# Patient Record
Sex: Female | Born: 1960
Health system: Southern US, Community
[De-identification: ages and names within clinical notes are randomized; demographics above are authoritative.]

## PROBLEM LIST (undated history)

## (undated) ENCOUNTER — Emergency Department (HOSPITAL_COMMUNITY): Admission: EM | Payer: Medicare HMO | Source: Home / Self Care

## (undated) DIAGNOSIS — C719 Malignant neoplasm of brain, unspecified: Secondary | ICD-10-CM

## (undated) DIAGNOSIS — D702 Other drug-induced agranulocytosis: Secondary | ICD-10-CM

## (undated) DIAGNOSIS — J019 Acute sinusitis, unspecified: Secondary | ICD-10-CM

## (undated) DIAGNOSIS — R197 Diarrhea, unspecified: Secondary | ICD-10-CM

## (undated) DIAGNOSIS — G4484 Primary exertional headache: Secondary | ICD-10-CM

## (undated) DIAGNOSIS — C801 Malignant (primary) neoplasm, unspecified: Secondary | ICD-10-CM

## (undated) DIAGNOSIS — N760 Acute vaginitis: Secondary | ICD-10-CM

## (undated) DIAGNOSIS — F339 Major depressive disorder, recurrent, unspecified: Secondary | ICD-10-CM

## (undated) HISTORY — PX: BRAIN SURGERY: SHX531

## (undated) HISTORY — DX: Malignant neoplasm of brain, unspecified: C71.9

## (undated) HISTORY — DX: Diarrhea, unspecified: R19.7

## (undated) HISTORY — DX: Acute sinusitis, unspecified: J01.90

## (undated) HISTORY — DX: Other drug-induced agranulocytosis: D70.2

## (undated) HISTORY — DX: Acute vaginitis: N76.0

## (undated) HISTORY — DX: Primary exertional headache: G44.84

## (undated) HISTORY — DX: Major depressive disorder, recurrent, unspecified: F33.9

## (undated) HISTORY — PX: OTHER SURGICAL HISTORY: SHX169

---

## 1991-04-10 HISTORY — PX: OTHER SURGICAL HISTORY: SHX169

## 1997-07-01 ENCOUNTER — Other Ambulatory Visit: Admission: RE | Admit: 1997-07-01 | Discharge: 1997-07-01 | Payer: Self-pay | Admitting: Obstetrics and Gynecology

## 1998-07-05 ENCOUNTER — Other Ambulatory Visit: Admission: RE | Admit: 1998-07-05 | Discharge: 1998-07-05 | Payer: Self-pay | Admitting: Obstetrics and Gynecology

## 1998-09-12 ENCOUNTER — Other Ambulatory Visit: Admission: RE | Admit: 1998-09-12 | Discharge: 1998-09-12 | Payer: Self-pay | Admitting: Obstetrics and Gynecology

## 1999-01-20 ENCOUNTER — Encounter: Admission: RE | Admit: 1999-01-20 | Discharge: 1999-04-20 | Payer: Self-pay | Admitting: Obstetrics and Gynecology

## 1999-04-11 ENCOUNTER — Inpatient Hospital Stay (HOSPITAL_COMMUNITY): Admission: AD | Admit: 1999-04-11 | Discharge: 1999-04-13 | Payer: Self-pay | Admitting: Obstetrics and Gynecology

## 1999-04-15 ENCOUNTER — Encounter: Admission: RE | Admit: 1999-04-15 | Discharge: 1999-07-14 | Payer: Self-pay | Admitting: Gynecology

## 1999-06-05 ENCOUNTER — Other Ambulatory Visit: Admission: RE | Admit: 1999-06-05 | Discharge: 1999-06-05 | Payer: Self-pay | Admitting: Gynecology

## 2000-06-04 ENCOUNTER — Other Ambulatory Visit: Admission: RE | Admit: 2000-06-04 | Discharge: 2000-06-04 | Payer: Self-pay | Admitting: Gynecology

## 2001-09-04 ENCOUNTER — Other Ambulatory Visit: Admission: RE | Admit: 2001-09-04 | Discharge: 2001-09-04 | Payer: Self-pay | Admitting: Gynecology

## 2002-08-28 ENCOUNTER — Encounter: Payer: Self-pay | Admitting: Gynecology

## 2002-08-28 ENCOUNTER — Encounter: Admission: RE | Admit: 2002-08-28 | Discharge: 2002-08-28 | Payer: Self-pay | Admitting: Gynecology

## 2002-09-08 ENCOUNTER — Encounter: Payer: Self-pay | Admitting: Family Medicine

## 2002-09-08 LAB — CONVERTED CEMR LAB

## 2002-12-04 ENCOUNTER — Other Ambulatory Visit: Admission: RE | Admit: 2002-12-04 | Discharge: 2002-12-04 | Payer: Self-pay | Admitting: Gynecology

## 2003-03-19 ENCOUNTER — Encounter: Payer: Self-pay | Admitting: Family Medicine

## 2003-03-19 LAB — CONVERTED CEMR LAB: TSH: 1.12 microintl units/mL

## 2003-03-29 ENCOUNTER — Encounter: Payer: Self-pay | Admitting: Family Medicine

## 2003-03-29 LAB — CONVERTED CEMR LAB
Blood Glucose, Fasting: 84 mg/dL
RBC count: 5.06 10*6/uL
WBC, blood: 7.5 10*3/uL

## 2003-11-08 ENCOUNTER — Encounter: Payer: Self-pay | Admitting: Family Medicine

## 2003-11-08 LAB — CONVERTED CEMR LAB

## 2004-01-12 ENCOUNTER — Encounter: Admission: RE | Admit: 2004-01-12 | Discharge: 2004-01-12 | Payer: Self-pay | Admitting: Gynecology

## 2004-02-15 ENCOUNTER — Other Ambulatory Visit: Admission: RE | Admit: 2004-02-15 | Discharge: 2004-02-15 | Payer: Self-pay | Admitting: Gynecology

## 2004-03-01 ENCOUNTER — Ambulatory Visit: Payer: Self-pay | Admitting: Family Medicine

## 2004-11-21 ENCOUNTER — Ambulatory Visit: Payer: Self-pay | Admitting: Family Medicine

## 2005-02-08 ENCOUNTER — Encounter: Admission: RE | Admit: 2005-02-08 | Discharge: 2005-02-08 | Payer: Self-pay | Admitting: Gynecology

## 2005-02-21 ENCOUNTER — Other Ambulatory Visit: Admission: RE | Admit: 2005-02-21 | Discharge: 2005-02-21 | Payer: Self-pay | Admitting: Gynecology

## 2005-10-11 ENCOUNTER — Encounter: Payer: Self-pay | Admitting: Family Medicine

## 2005-10-11 ENCOUNTER — Ambulatory Visit: Payer: Self-pay | Admitting: Internal Medicine

## 2005-10-11 LAB — CONVERTED CEMR LAB: Rapid Strep: NEGATIVE

## 2005-12-07 ENCOUNTER — Ambulatory Visit: Payer: Self-pay | Admitting: Family Medicine

## 2006-01-10 ENCOUNTER — Encounter (INDEPENDENT_AMBULATORY_CARE_PROVIDER_SITE_OTHER): Payer: Self-pay | Admitting: *Deleted

## 2006-01-10 ENCOUNTER — Inpatient Hospital Stay (HOSPITAL_COMMUNITY): Admission: RE | Admit: 2006-01-10 | Discharge: 2006-01-13 | Payer: Self-pay | Admitting: Neurological Surgery

## 2006-01-10 DIAGNOSIS — C711 Malignant neoplasm of frontal lobe: Secondary | ICD-10-CM | POA: Insufficient documentation

## 2006-01-10 HISTORY — PX: OTHER SURGICAL HISTORY: SHX169

## 2006-01-13 HISTORY — PX: OTHER SURGICAL HISTORY: SHX169

## 2006-01-22 ENCOUNTER — Ambulatory Visit: Admission: RE | Admit: 2006-01-22 | Discharge: 2006-04-22 | Payer: Self-pay | Admitting: Radiation Oncology

## 2006-02-07 ENCOUNTER — Ambulatory Visit: Payer: Self-pay | Admitting: Internal Medicine

## 2006-02-13 LAB — CBC WITH DIFFERENTIAL/PLATELET
Basophils Absolute: 0 10*3/uL (ref 0.0–0.1)
Eosinophils Absolute: 0.1 10*3/uL (ref 0.0–0.5)
HCT: 34.3 % — ABNORMAL LOW (ref 34.8–46.6)
HGB: 11.3 g/dL — ABNORMAL LOW (ref 11.6–15.9)
LYMPH%: 40.4 % (ref 14.0–48.0)
MCV: 69.2 fL — ABNORMAL LOW (ref 81.0–101.0)
MONO%: 6.3 % (ref 0.0–13.0)
NEUT#: 4.5 10*3/uL (ref 1.5–6.5)
Platelets: 455 10*3/uL — ABNORMAL HIGH (ref 145–400)

## 2006-02-20 LAB — CBC WITH DIFFERENTIAL/PLATELET
BASO%: 1.4 % (ref 0.0–2.0)
Eosinophils Absolute: 0.1 10*3/uL (ref 0.0–0.5)
LYMPH%: 32.1 % (ref 14.0–48.0)
MCHC: 33 g/dL (ref 32.0–36.0)
MCV: 69.8 fL — ABNORMAL LOW (ref 81.0–101.0)
MONO%: 7.8 % (ref 0.0–13.0)
NEUT%: 57.1 % (ref 39.6–76.8)
Platelets: 477 10*3/uL — ABNORMAL HIGH (ref 145–400)
RBC: 4.8 10*6/uL (ref 3.70–5.32)

## 2006-02-25 ENCOUNTER — Encounter: Admission: RE | Admit: 2006-02-25 | Discharge: 2006-02-25 | Payer: Self-pay | Admitting: Gynecology

## 2006-02-26 LAB — CBC WITH DIFFERENTIAL/PLATELET
BASO%: 0.2 % (ref 0.0–2.0)
EOS%: 1.4 % (ref 0.0–7.0)
LYMPH%: 27.7 % (ref 14.0–48.0)
MCH: 23.3 pg — ABNORMAL LOW (ref 26.0–34.0)
MCHC: 33.2 g/dL (ref 32.0–36.0)
MONO#: 0.6 10*3/uL (ref 0.1–0.9)
Platelets: 436 10*3/uL — ABNORMAL HIGH (ref 145–400)
RBC: 4.84 10*6/uL (ref 3.70–5.32)
WBC: 7.3 10*3/uL (ref 3.9–10.0)

## 2006-03-01 LAB — LACTATE DEHYDROGENASE: LDH: 152 U/L (ref 94–250)

## 2006-03-01 LAB — CBC WITH DIFFERENTIAL/PLATELET
BASO%: 0.4 % (ref 0.0–2.0)
EOS%: 1.4 % (ref 0.0–7.0)
HCT: 33.7 % — ABNORMAL LOW (ref 34.8–46.6)
MCH: 23.3 pg — ABNORMAL LOW (ref 26.0–34.0)
MCHC: 32.9 g/dL (ref 32.0–36.0)
MONO#: 0.4 10*3/uL (ref 0.1–0.9)
NEUT%: 58.1 % (ref 39.6–76.8)
RBC: 4.77 10*6/uL (ref 3.70–5.32)
RDW: 20.9 % — ABNORMAL HIGH (ref 11.3–14.5)
WBC: 6.1 10*3/uL (ref 3.9–10.0)
lymph#: 2 10*3/uL (ref 0.9–3.3)

## 2006-03-01 LAB — COMPREHENSIVE METABOLIC PANEL
ALT: 13 U/L (ref 0–35)
AST: 14 U/L (ref 0–37)
Albumin: 4.1 g/dL (ref 3.5–5.2)
Alkaline Phosphatase: 98 U/L (ref 39–117)
BUN: 11 mg/dL (ref 6–23)
CO2: 26 mEq/L (ref 19–32)
Calcium: 9.1 mg/dL (ref 8.4–10.5)
Chloride: 105 mEq/L (ref 96–112)
Creatinine, Ser: 0.72 mg/dL (ref 0.40–1.20)
Glucose, Bld: 85 mg/dL (ref 70–99)
Potassium: 4.4 mEq/L (ref 3.5–5.3)
Sodium: 139 mEq/L (ref 135–145)
Total Bilirubin: 0.4 mg/dL (ref 0.3–1.2)
Total Protein: 6.9 g/dL (ref 6.0–8.3)

## 2006-03-05 LAB — CBC WITH DIFFERENTIAL/PLATELET
BASO%: 1 % (ref 0.0–2.0)
Basophils Absolute: 0.1 10*3/uL (ref 0.0–0.1)
EOS%: 1.1 % (ref 0.0–7.0)
HGB: 10.7 g/dL — ABNORMAL LOW (ref 11.6–15.9)
MCH: 23 pg — ABNORMAL LOW (ref 26.0–34.0)
MCHC: 32.7 g/dL (ref 32.0–36.0)
MCV: 70.2 fL — ABNORMAL LOW (ref 81.0–101.0)
MONO%: 6.7 % (ref 0.0–13.0)
NEUT%: 63.4 % (ref 39.6–76.8)
RDW: 21.5 % — ABNORMAL HIGH (ref 11.3–14.5)
lymph#: 1.9 10*3/uL (ref 0.9–3.3)

## 2006-03-07 ENCOUNTER — Other Ambulatory Visit: Admission: RE | Admit: 2006-03-07 | Discharge: 2006-03-07 | Payer: Self-pay | Admitting: Gynecology

## 2006-03-12 LAB — CBC WITH DIFFERENTIAL/PLATELET
BASO%: 0.4 % (ref 0.0–2.0)
EOS%: 1.6 % (ref 0.0–7.0)
HGB: 11.3 g/dL — ABNORMAL LOW (ref 11.6–15.9)
MCH: 23.2 pg — ABNORMAL LOW (ref 26.0–34.0)
MCHC: 32.8 g/dL (ref 32.0–36.0)
MONO#: 0.6 10*3/uL (ref 0.1–0.9)
RDW: 21.2 % — ABNORMAL HIGH (ref 11.3–14.5)
WBC: 6.4 10*3/uL (ref 3.9–10.0)
lymph#: 1.6 10*3/uL (ref 0.9–3.3)

## 2006-03-19 LAB — CBC WITH DIFFERENTIAL/PLATELET
Basophils Absolute: 0 10*3/uL (ref 0.0–0.1)
Eosinophils Absolute: 0.1 10*3/uL (ref 0.0–0.5)
HGB: 11.3 g/dL — ABNORMAL LOW (ref 11.6–15.9)
MONO#: 0.6 10*3/uL (ref 0.1–0.9)
NEUT#: 3.7 10*3/uL (ref 1.5–6.5)
RDW: 21.3 % — ABNORMAL HIGH (ref 11.3–14.5)
lymph#: 1.5 10*3/uL (ref 0.9–3.3)

## 2006-03-21 LAB — CBC WITH DIFFERENTIAL/PLATELET
Basophils Absolute: 0 10*3/uL (ref 0.0–0.1)
Eosinophils Absolute: 0.1 10*3/uL (ref 0.0–0.5)
HCT: 34.2 % — ABNORMAL LOW (ref 34.8–46.6)
HGB: 11.3 g/dL — ABNORMAL LOW (ref 11.6–15.9)
LYMPH%: 20.8 % (ref 14.0–48.0)
MCV: 70.7 fL — ABNORMAL LOW (ref 81.0–101.0)
MONO%: 6.9 % (ref 0.0–13.0)
NEUT#: 3.9 10*3/uL (ref 1.5–6.5)
Platelets: 479 10*3/uL — ABNORMAL HIGH (ref 145–400)
RDW: 20.9 % — ABNORMAL HIGH (ref 11.3–14.5)

## 2006-03-21 LAB — COMPREHENSIVE METABOLIC PANEL
Albumin: 4 g/dL (ref 3.5–5.2)
Alkaline Phosphatase: 98 U/L (ref 39–117)
BUN: 11 mg/dL (ref 6–23)
Glucose, Bld: 110 mg/dL — ABNORMAL HIGH (ref 70–99)
Potassium: 3.9 mEq/L (ref 3.5–5.3)

## 2006-03-26 ENCOUNTER — Ambulatory Visit: Payer: Self-pay | Admitting: Internal Medicine

## 2006-03-26 LAB — CBC WITH DIFFERENTIAL/PLATELET
Basophils Absolute: 0 10*3/uL (ref 0.0–0.1)
Eosinophils Absolute: 0.2 10*3/uL (ref 0.0–0.5)
HGB: 11.1 g/dL — ABNORMAL LOW (ref 11.6–15.9)
MCV: 71.5 fL — ABNORMAL LOW (ref 81.0–101.0)
NEUT#: 3.5 10*3/uL (ref 1.5–6.5)
RDW: 21.3 % — ABNORMAL HIGH (ref 11.3–14.5)
lymph#: 1.1 10*3/uL (ref 0.9–3.3)

## 2006-05-02 LAB — COMPREHENSIVE METABOLIC PANEL
ALT: 10 U/L (ref 0–35)
AST: 13 U/L (ref 0–37)
Albumin: 4 g/dL (ref 3.5–5.2)
Alkaline Phosphatase: 94 U/L (ref 39–117)
Calcium: 9.4 mg/dL (ref 8.4–10.5)
Chloride: 103 mEq/L (ref 96–112)
Potassium: 4.1 mEq/L (ref 3.5–5.3)
Sodium: 141 mEq/L (ref 135–145)
Total Protein: 7 g/dL (ref 6.0–8.3)

## 2006-05-02 LAB — CBC WITH DIFFERENTIAL/PLATELET
EOS%: 1.4 % (ref 0.0–7.0)
HGB: 12.3 g/dL (ref 11.6–15.9)
MCH: 24.5 pg — ABNORMAL LOW (ref 26.0–34.0)
MCV: 72.2 fL — ABNORMAL LOW (ref 81.0–101.0)
MONO%: 8.6 % (ref 0.0–13.0)
NEUT#: 3.3 10*3/uL (ref 1.5–6.5)
RBC: 5.01 10*6/uL (ref 3.70–5.32)
RDW: 18.2 % — ABNORMAL HIGH (ref 11.3–14.5)
lymph#: 1.8 10*3/uL (ref 0.9–3.3)

## 2006-05-06 ENCOUNTER — Ambulatory Visit (HOSPITAL_COMMUNITY): Admission: RE | Admit: 2006-05-06 | Discharge: 2006-05-06 | Payer: Self-pay | Admitting: Radiation Oncology

## 2006-05-16 ENCOUNTER — Ambulatory Visit: Payer: Self-pay | Admitting: Family Medicine

## 2006-05-27 ENCOUNTER — Ambulatory Visit: Payer: Self-pay | Admitting: Internal Medicine

## 2006-05-30 LAB — COMPREHENSIVE METABOLIC PANEL
ALT: 10 U/L (ref 0–35)
AST: 12 U/L (ref 0–37)
Albumin: 3.9 g/dL (ref 3.5–5.2)
Alkaline Phosphatase: 73 U/L (ref 39–117)
BUN: 10 mg/dL (ref 6–23)
Potassium: 4.1 mEq/L (ref 3.5–5.3)
Sodium: 139 mEq/L (ref 135–145)

## 2006-05-30 LAB — CBC WITH DIFFERENTIAL/PLATELET
BASO%: 0.3 % (ref 0.0–2.0)
Basophils Absolute: 0 10*3/uL (ref 0.0–0.1)
EOS%: 2.1 % (ref 0.0–7.0)
MCH: 24.7 pg — ABNORMAL LOW (ref 26.0–34.0)
MCHC: 33.9 g/dL (ref 32.0–36.0)
MCV: 73 fL — ABNORMAL LOW (ref 81.0–101.0)
MONO%: 6.1 % (ref 0.0–13.0)
RBC: 4.92 10*6/uL (ref 3.70–5.32)
RDW: 17.6 % — ABNORMAL HIGH (ref 11.3–14.5)

## 2006-06-14 ENCOUNTER — Ambulatory Visit: Payer: Self-pay | Admitting: Family Medicine

## 2006-06-21 ENCOUNTER — Ambulatory Visit (HOSPITAL_COMMUNITY): Admission: RE | Admit: 2006-06-21 | Discharge: 2006-06-21 | Payer: Self-pay | Admitting: Neurological Surgery

## 2006-06-27 LAB — CBC WITH DIFFERENTIAL/PLATELET
Basophils Absolute: 0 10*3/uL (ref 0.0–0.1)
EOS%: 1.9 % (ref 0.0–7.0)
HCT: 35.4 % (ref 34.8–46.6)
HGB: 12.3 g/dL (ref 11.6–15.9)
MCH: 25.7 pg — ABNORMAL LOW (ref 26.0–34.0)
MCHC: 34.8 g/dL (ref 32.0–36.0)
MCV: 73.7 fL — ABNORMAL LOW (ref 81.0–101.0)
MONO%: 7.1 % (ref 0.0–13.0)
NEUT%: 65.4 % (ref 39.6–76.8)
RDW: 17.9 % — ABNORMAL HIGH (ref 11.3–14.5)

## 2006-06-27 LAB — COMPREHENSIVE METABOLIC PANEL
AST: 15 U/L (ref 0–37)
Alkaline Phosphatase: 88 U/L (ref 39–117)
BUN: 10 mg/dL (ref 6–23)
Creatinine, Ser: 0.72 mg/dL (ref 0.40–1.20)

## 2006-08-05 ENCOUNTER — Ambulatory Visit: Payer: Self-pay | Admitting: Internal Medicine

## 2006-08-08 LAB — COMPREHENSIVE METABOLIC PANEL
AST: 13 U/L (ref 0–37)
Alkaline Phosphatase: 84 U/L (ref 39–117)
BUN: 10 mg/dL (ref 6–23)
Calcium: 8.9 mg/dL (ref 8.4–10.5)
Chloride: 102 mEq/L (ref 96–112)
Creatinine, Ser: 0.63 mg/dL (ref 0.40–1.20)

## 2006-08-08 LAB — CBC WITH DIFFERENTIAL/PLATELET
Basophils Absolute: 0 10*3/uL (ref 0.0–0.1)
EOS%: 1.9 % (ref 0.0–7.0)
HCT: 36.8 % (ref 34.8–46.6)
HGB: 12.7 g/dL (ref 11.6–15.9)
MCH: 26.3 pg (ref 26.0–34.0)
MCV: 76.1 fL — ABNORMAL LOW (ref 81.0–101.0)
MONO%: 5.9 % (ref 0.0–13.0)
NEUT%: 65.7 % (ref 39.6–76.8)
Platelets: 388 10*3/uL (ref 145–400)

## 2006-09-05 LAB — COMPREHENSIVE METABOLIC PANEL
BUN: 11 mg/dL (ref 6–23)
CO2: 26 mEq/L (ref 19–32)
Calcium: 9.2 mg/dL (ref 8.4–10.5)
Chloride: 102 mEq/L (ref 96–112)
Creatinine, Ser: 0.65 mg/dL (ref 0.40–1.20)
Glucose, Bld: 84 mg/dL (ref 70–99)

## 2006-09-05 LAB — CBC WITH DIFFERENTIAL/PLATELET
Basophils Absolute: 0 10*3/uL (ref 0.0–0.1)
EOS%: 1.9 % (ref 0.0–7.0)
Eosinophils Absolute: 0.1 10*3/uL (ref 0.0–0.5)
HCT: 37 % (ref 34.8–46.6)
HGB: 12.8 g/dL (ref 11.6–15.9)
MCH: 25.9 pg — ABNORMAL LOW (ref 26.0–34.0)
MONO#: 0.4 10*3/uL (ref 0.1–0.9)
NEUT%: 66.4 % (ref 39.6–76.8)
lymph#: 1.6 10*3/uL (ref 0.9–3.3)

## 2006-09-27 ENCOUNTER — Ambulatory Visit: Payer: Self-pay | Admitting: Internal Medicine

## 2006-09-30 ENCOUNTER — Ambulatory Visit (HOSPITAL_COMMUNITY): Admission: RE | Admit: 2006-09-30 | Discharge: 2006-09-30 | Payer: Self-pay | Admitting: Internal Medicine

## 2006-10-02 LAB — CBC WITH DIFFERENTIAL/PLATELET
Basophils Absolute: 0 10*3/uL (ref 0.0–0.1)
HCT: 37.6 % (ref 34.8–46.6)
HGB: 13.1 g/dL (ref 11.6–15.9)
MONO#: 0.6 10*3/uL (ref 0.1–0.9)
NEUT#: 5.9 10*3/uL (ref 1.5–6.5)
NEUT%: 73.3 % (ref 39.6–76.8)
RDW: 17.3 % — ABNORMAL HIGH (ref 11.3–14.5)
WBC: 8.1 10*3/uL (ref 3.9–10.0)
lymph#: 1.4 10*3/uL (ref 0.9–3.3)

## 2006-10-02 LAB — COMPREHENSIVE METABOLIC PANEL
ALT: 12 U/L (ref 0–35)
Albumin: 3.9 g/dL (ref 3.5–5.2)
BUN: 9 mg/dL (ref 6–23)
CO2: 25 mEq/L (ref 19–32)
Calcium: 9.2 mg/dL (ref 8.4–10.5)
Chloride: 102 mEq/L (ref 96–112)
Creatinine, Ser: 0.65 mg/dL (ref 0.40–1.20)
Potassium: 3.9 mEq/L (ref 3.5–5.3)

## 2006-10-08 HISTORY — PX: OTHER SURGICAL HISTORY: SHX169

## 2006-11-07 ENCOUNTER — Encounter: Payer: Self-pay | Admitting: Family Medicine

## 2006-11-08 ENCOUNTER — Ambulatory Visit: Payer: Self-pay | Admitting: Internal Medicine

## 2006-11-21 ENCOUNTER — Ambulatory Visit: Admission: RE | Admit: 2006-11-21 | Discharge: 2006-12-31 | Payer: Self-pay | Admitting: Radiation Oncology

## 2006-11-21 LAB — HCG, SERUM, QUALITATIVE: Preg, Serum: NEGATIVE

## 2006-12-02 LAB — CBC WITH DIFFERENTIAL/PLATELET
Basophils Absolute: 0 10*3/uL (ref 0.0–0.1)
EOS%: 3.1 % (ref 0.0–7.0)
HGB: 12.3 g/dL (ref 11.6–15.9)
MCH: 27.3 pg (ref 26.0–34.0)
MCV: 78.1 fL — ABNORMAL LOW (ref 81.0–101.0)
MONO%: 6 % (ref 0.0–13.0)
NEUT#: 1.3 10*3/uL — ABNORMAL LOW (ref 1.5–6.5)
RBC: 4.5 10*6/uL (ref 3.70–5.32)
RDW: 19.7 % — ABNORMAL HIGH (ref 11.3–14.5)
lymph#: 1.6 10*3/uL (ref 0.9–3.3)

## 2006-12-02 LAB — COMPREHENSIVE METABOLIC PANEL
ALT: 12 U/L (ref 0–35)
AST: 17 U/L (ref 0–37)
Albumin: 3.7 g/dL (ref 3.5–5.2)
Calcium: 9 mg/dL (ref 8.4–10.5)
Chloride: 105 mEq/L (ref 96–112)
Potassium: 4.4 mEq/L (ref 3.5–5.3)

## 2006-12-10 LAB — COMPREHENSIVE METABOLIC PANEL
Alkaline Phosphatase: 90 U/L (ref 39–117)
BUN: 14 mg/dL (ref 6–23)
Glucose, Bld: 90 mg/dL (ref 70–99)
Total Bilirubin: 0.5 mg/dL (ref 0.3–1.2)

## 2006-12-10 LAB — CBC WITH DIFFERENTIAL/PLATELET
Basophils Absolute: 0 10*3/uL (ref 0.0–0.1)
EOS%: 2.3 % (ref 0.0–7.0)
Eosinophils Absolute: 0.1 10*3/uL (ref 0.0–0.5)
HCT: 37.4 % (ref 34.8–46.6)
HGB: 13 g/dL (ref 11.6–15.9)
MCH: 27.4 pg (ref 26.0–34.0)
MCV: 79.1 fL — ABNORMAL LOW (ref 81.0–101.0)
MONO%: 6.1 % (ref 0.0–13.0)
NEUT#: 1.6 10*3/uL (ref 1.5–6.5)
NEUT%: 39.2 % — ABNORMAL LOW (ref 39.6–76.8)
Platelets: 373 10*3/uL (ref 145–400)
RDW: 20.3 % — ABNORMAL HIGH (ref 11.3–14.5)

## 2006-12-16 LAB — CBC WITH DIFFERENTIAL/PLATELET
Basophils Absolute: 0 10*3/uL (ref 0.0–0.1)
EOS%: 3.5 % (ref 0.0–7.0)
Eosinophils Absolute: 0.1 10*3/uL (ref 0.0–0.5)
HGB: 12.8 g/dL (ref 11.6–15.9)
LYMPH%: 54.9 % — ABNORMAL HIGH (ref 14.0–48.0)
MCH: 27.8 pg (ref 26.0–34.0)
MCV: 80.2 fL — ABNORMAL LOW (ref 81.0–101.0)
MONO%: 6.7 % (ref 0.0–13.0)
NEUT#: 1.1 10*3/uL — ABNORMAL LOW (ref 1.5–6.5)
Platelets: 255 10*3/uL (ref 145–400)
RBC: 4.6 10*6/uL (ref 3.70–5.32)
RDW: 21.3 % — ABNORMAL HIGH (ref 11.3–14.5)

## 2006-12-16 LAB — COMPREHENSIVE METABOLIC PANEL
AST: 24 U/L (ref 0–37)
Alkaline Phosphatase: 84 U/L (ref 39–117)
BUN: 11 mg/dL (ref 6–23)
Glucose, Bld: 87 mg/dL (ref 70–99)
Total Bilirubin: 0.5 mg/dL (ref 0.3–1.2)

## 2006-12-16 LAB — PHOSPHORUS: Phosphorus: 3.1 mg/dL (ref 2.3–4.6)

## 2006-12-20 ENCOUNTER — Ambulatory Visit (HOSPITAL_COMMUNITY): Admission: RE | Admit: 2006-12-20 | Discharge: 2006-12-20 | Payer: Self-pay | Admitting: Internal Medicine

## 2006-12-30 ENCOUNTER — Ambulatory Visit: Payer: Self-pay | Admitting: Internal Medicine

## 2006-12-30 LAB — CBC WITH DIFFERENTIAL/PLATELET
BASO%: 1 % (ref 0.0–2.0)
Basophils Absolute: 0 10*3/uL (ref 0.0–0.1)
HCT: 37.1 % (ref 34.8–46.6)
HGB: 12.9 g/dL (ref 11.6–15.9)
MCHC: 34.8 g/dL (ref 32.0–36.0)
MONO#: 0.2 10*3/uL (ref 0.1–0.9)
NEUT%: 29.9 % — ABNORMAL LOW (ref 39.6–76.8)
WBC: 2.8 10*3/uL — ABNORMAL LOW (ref 3.9–10.0)
lymph#: 1.7 10*3/uL (ref 0.9–3.3)

## 2006-12-30 LAB — COMPREHENSIVE METABOLIC PANEL
ALT: 41 U/L — ABNORMAL HIGH (ref 0–35)
Albumin: 4.1 g/dL (ref 3.5–5.2)
CO2: 27 mEq/L (ref 19–32)
Calcium: 8.8 mg/dL (ref 8.4–10.5)
Chloride: 106 mEq/L (ref 96–112)
Creatinine, Ser: 0.8 mg/dL (ref 0.40–1.20)

## 2006-12-30 LAB — MAGNESIUM: Magnesium: 1.9 mg/dL (ref 1.5–2.5)

## 2007-01-03 ENCOUNTER — Ambulatory Visit: Payer: Self-pay | Admitting: Family Medicine

## 2007-01-03 DIAGNOSIS — D702 Other drug-induced agranulocytosis: Secondary | ICD-10-CM | POA: Insufficient documentation

## 2007-01-04 ENCOUNTER — Encounter: Payer: Self-pay | Admitting: Family Medicine

## 2007-01-06 LAB — CBC WITH DIFFERENTIAL/PLATELET
Basophils Absolute: 0 10*3/uL (ref 0.0–0.1)
Eosinophils Absolute: 0.1 10*3/uL (ref 0.0–0.5)
HCT: 34.5 % — ABNORMAL LOW (ref 34.8–46.6)
HGB: 12 g/dL (ref 11.6–15.9)
MONO#: 0.3 10*3/uL (ref 0.1–0.9)
NEUT#: 0.8 10*3/uL — ABNORMAL LOW (ref 1.5–6.5)
NEUT%: 30.9 % — ABNORMAL LOW (ref 39.6–76.8)
WBC: 2.6 10*3/uL — ABNORMAL LOW (ref 3.9–10.0)
lymph#: 1.5 10*3/uL (ref 0.9–3.3)

## 2007-01-06 LAB — COMPREHENSIVE METABOLIC PANEL
AST: 54 U/L — ABNORMAL HIGH (ref 0–37)
Albumin: 3.6 g/dL (ref 3.5–5.2)
Alkaline Phosphatase: 76 U/L (ref 39–117)
BUN: 8 mg/dL (ref 6–23)
Creatinine, Ser: 0.71 mg/dL (ref 0.40–1.20)
Potassium: 3.8 mEq/L (ref 3.5–5.3)
Total Bilirubin: 0.4 mg/dL (ref 0.3–1.2)

## 2007-01-06 LAB — PHOSPHORUS: Phosphorus: 2.8 mg/dL (ref 2.3–4.6)

## 2007-01-07 ENCOUNTER — Encounter: Payer: Self-pay | Admitting: Family Medicine

## 2007-01-13 LAB — CBC WITH DIFFERENTIAL/PLATELET
Basophils Absolute: 0 10*3/uL (ref 0.0–0.1)
EOS%: 2.4 % (ref 0.0–7.0)
Eosinophils Absolute: 0.1 10*3/uL (ref 0.0–0.5)
HCT: 37.1 % (ref 34.8–46.6)
HGB: 12.9 g/dL (ref 11.6–15.9)
MCH: 29.2 pg (ref 26.0–34.0)
MCV: 83.9 fL (ref 81.0–101.0)
MONO%: 8.4 % (ref 0.0–13.0)
NEUT#: 1.4 10*3/uL — ABNORMAL LOW (ref 1.5–6.5)
NEUT%: 37.8 % — ABNORMAL LOW (ref 39.6–76.8)

## 2007-01-13 LAB — MAGNESIUM: Magnesium: 1.9 mg/dL (ref 1.5–2.5)

## 2007-01-13 LAB — COMPREHENSIVE METABOLIC PANEL
ALT: 52 U/L — ABNORMAL HIGH (ref 0–35)
Chloride: 106 mEq/L (ref 96–112)
Total Bilirubin: 0.4 mg/dL (ref 0.3–1.2)
Total Protein: 6.6 g/dL (ref 6.0–8.3)

## 2007-01-13 LAB — PHOSPHORUS: Phosphorus: 3.8 mg/dL (ref 2.3–4.6)

## 2007-01-17 ENCOUNTER — Ambulatory Visit (HOSPITAL_COMMUNITY): Admission: RE | Admit: 2007-01-17 | Discharge: 2007-01-17 | Payer: Self-pay | Admitting: Neurosurgery

## 2007-01-27 LAB — MAGNESIUM: Magnesium: 1.8 mg/dL (ref 1.5–2.5)

## 2007-01-27 LAB — CBC WITH DIFFERENTIAL/PLATELET
BASO%: 0.2 % (ref 0.0–2.0)
MCHC: 34.9 g/dL (ref 32.0–36.0)
MONO#: 0.3 10*3/uL (ref 0.1–0.9)
RBC: 4.16 10*6/uL (ref 3.70–5.32)
WBC: 4.4 10*3/uL (ref 3.9–10.0)
lymph#: 1.6 10*3/uL (ref 0.9–3.3)

## 2007-01-27 LAB — COMPREHENSIVE METABOLIC PANEL
ALT: 49 U/L — ABNORMAL HIGH (ref 0–35)
CO2: 26 mEq/L (ref 19–32)
Calcium: 8.9 mg/dL (ref 8.4–10.5)
Chloride: 104 mEq/L (ref 96–112)
Sodium: 140 mEq/L (ref 135–145)
Total Protein: 6.4 g/dL (ref 6.0–8.3)

## 2007-02-03 LAB — MAGNESIUM: Magnesium: 1.7 mg/dL (ref 1.5–2.5)

## 2007-02-03 LAB — COMPREHENSIVE METABOLIC PANEL
ALT: 33 U/L (ref 0–35)
CO2: 26 mEq/L (ref 19–32)
Calcium: 8.4 mg/dL (ref 8.4–10.5)
Chloride: 105 mEq/L (ref 96–112)
Glucose, Bld: 95 mg/dL (ref 70–99)
Sodium: 140 mEq/L (ref 135–145)
Total Protein: 6.3 g/dL (ref 6.0–8.3)

## 2007-02-03 LAB — CBC WITH DIFFERENTIAL/PLATELET
BASO%: 0.7 % (ref 0.0–2.0)
Eosinophils Absolute: 0.1 10*3/uL (ref 0.0–0.5)
HCT: 37.2 % (ref 34.8–46.6)
MCHC: 34.4 g/dL (ref 32.0–36.0)
MONO#: 0.2 10*3/uL (ref 0.1–0.9)
NEUT#: 1.1 10*3/uL — ABNORMAL LOW (ref 1.5–6.5)
NEUT%: 35.9 % — ABNORMAL LOW (ref 39.6–76.8)
Platelets: 385 10*3/uL (ref 145–400)
RBC: 4.34 10*6/uL (ref 3.70–5.32)
WBC: 3.1 10*3/uL — ABNORMAL LOW (ref 3.9–10.0)
lymph#: 1.6 10*3/uL (ref 0.9–3.3)

## 2007-02-03 LAB — PHOSPHORUS: Phosphorus: 2.9 mg/dL (ref 2.3–4.6)

## 2007-02-10 LAB — CBC WITH DIFFERENTIAL/PLATELET
BASO%: 0.6 % (ref 0.0–2.0)
EOS%: 2.6 % (ref 0.0–7.0)
MCH: 30.1 pg (ref 26.0–34.0)
MCHC: 34.9 g/dL (ref 32.0–36.0)
MCV: 86.4 fL (ref 81.0–101.0)
MONO%: 5.4 % (ref 0.0–13.0)
RBC: 4.1 10*6/uL (ref 3.70–5.32)
RDW: 20.3 % — ABNORMAL HIGH (ref 11.3–14.5)

## 2007-02-10 LAB — COMPREHENSIVE METABOLIC PANEL
ALT: 39 U/L — ABNORMAL HIGH (ref 0–35)
AST: 30 U/L (ref 0–37)
Albumin: 3.2 g/dL — ABNORMAL LOW (ref 3.5–5.2)
Alkaline Phosphatase: 89 U/L (ref 39–117)
BUN: 11 mg/dL (ref 6–23)
Potassium: 4 mEq/L (ref 3.5–5.3)
Sodium: 139 mEq/L (ref 135–145)
Total Protein: 5.6 g/dL — ABNORMAL LOW (ref 6.0–8.3)

## 2007-02-10 LAB — PHOSPHORUS: Phosphorus: 3.5 mg/dL (ref 2.3–4.6)

## 2007-02-13 ENCOUNTER — Ambulatory Visit: Payer: Self-pay | Admitting: Internal Medicine

## 2007-02-17 LAB — CBC WITH DIFFERENTIAL/PLATELET
BASO%: 0.3 % (ref 0.0–2.0)
Basophils Absolute: 0 10*3/uL (ref 0.0–0.1)
HCT: 36.7 % (ref 34.8–46.6)
HGB: 12.7 g/dL (ref 11.6–15.9)
MONO#: 0.2 10*3/uL (ref 0.1–0.9)
NEUT%: 46.6 % (ref 39.6–76.8)
WBC: 3.7 10*3/uL — ABNORMAL LOW (ref 3.9–10.0)
lymph#: 1.7 10*3/uL (ref 0.9–3.3)

## 2007-02-17 LAB — COMPREHENSIVE METABOLIC PANEL
ALT: 33 U/L (ref 0–35)
Albumin: 4 g/dL (ref 3.5–5.2)
BUN: 13 mg/dL (ref 6–23)
CO2: 26 mEq/L (ref 19–32)
Calcium: 8.9 mg/dL (ref 8.4–10.5)
Chloride: 102 mEq/L (ref 96–112)
Creatinine, Ser: 0.79 mg/dL (ref 0.40–1.20)

## 2007-02-17 LAB — PHOSPHORUS: Phosphorus: 3.1 mg/dL (ref 2.3–4.6)

## 2007-02-17 LAB — MAGNESIUM: Magnesium: 2 mg/dL (ref 1.5–2.5)

## 2007-02-21 ENCOUNTER — Ambulatory Visit (HOSPITAL_COMMUNITY): Admission: RE | Admit: 2007-02-21 | Discharge: 2007-02-21 | Payer: Self-pay | Admitting: Internal Medicine

## 2007-03-03 LAB — CBC WITH DIFFERENTIAL/PLATELET
BASO%: 0.5 % (ref 0.0–2.0)
Basophils Absolute: 0 10*3/uL (ref 0.0–0.1)
EOS%: 2.1 % (ref 0.0–7.0)
HGB: 12.5 g/dL (ref 11.6–15.9)
MCH: 31.3 pg (ref 26.0–34.0)
MCHC: 34.4 g/dL (ref 32.0–36.0)
MCV: 90.8 fL (ref 81.0–101.0)
MONO%: 7.6 % (ref 0.0–13.0)
NEUT%: 38.7 % — ABNORMAL LOW (ref 39.6–76.8)
RDW: 18.2 % — ABNORMAL HIGH (ref 11.3–14.5)
lymph#: 1.3 10*3/uL (ref 0.9–3.3)

## 2007-03-03 LAB — COMPREHENSIVE METABOLIC PANEL
ALT: 41 U/L — ABNORMAL HIGH (ref 0–35)
AST: 31 U/L (ref 0–37)
Alkaline Phosphatase: 93 U/L (ref 39–117)
BUN: 10 mg/dL (ref 6–23)
Creatinine, Ser: 0.82 mg/dL (ref 0.40–1.20)
Potassium: 4 mEq/L (ref 3.5–5.3)

## 2007-03-10 LAB — CBC WITH DIFFERENTIAL/PLATELET
Basophils Absolute: 0 10*3/uL (ref 0.0–0.1)
Eosinophils Absolute: 0 10*3/uL (ref 0.0–0.5)
HCT: 36 % (ref 34.8–46.6)
LYMPH%: 54 % — ABNORMAL HIGH (ref 14.0–48.0)
MCV: 91.7 fL (ref 81.0–101.0)
MONO#: 0.3 10*3/uL (ref 0.1–0.9)
NEUT#: 1.1 10*3/uL — ABNORMAL LOW (ref 1.5–6.5)
NEUT%: 36 % — ABNORMAL LOW (ref 39.6–76.8)
Platelets: 283 10*3/uL (ref 145–400)
WBC: 3.2 10*3/uL — ABNORMAL LOW (ref 3.9–10.0)

## 2007-03-10 LAB — MAGNESIUM: Magnesium: 2 mg/dL (ref 1.5–2.5)

## 2007-03-10 LAB — COMPREHENSIVE METABOLIC PANEL
ALT: 54 U/L — ABNORMAL HIGH (ref 0–35)
Alkaline Phosphatase: 86 U/L (ref 39–117)
CO2: 25 mEq/L (ref 19–32)
Creatinine, Ser: 0.73 mg/dL (ref 0.40–1.20)
Total Bilirubin: 0.4 mg/dL (ref 0.3–1.2)

## 2007-03-17 LAB — COMPREHENSIVE METABOLIC PANEL
Albumin: 4 g/dL (ref 3.5–5.2)
BUN: 10 mg/dL (ref 6–23)
Calcium: 8.6 mg/dL (ref 8.4–10.5)
Chloride: 103 mEq/L (ref 96–112)
Glucose, Bld: 113 mg/dL — ABNORMAL HIGH (ref 70–99)
Potassium: 4.2 mEq/L (ref 3.5–5.3)

## 2007-03-17 LAB — CBC WITH DIFFERENTIAL/PLATELET
Basophils Absolute: 0 10*3/uL (ref 0.0–0.1)
Eosinophils Absolute: 0 10*3/uL (ref 0.0–0.5)
HCT: 36.6 % (ref 34.8–46.6)
HGB: 12.8 g/dL (ref 11.6–15.9)
MCV: 92.2 fL (ref 81.0–101.0)
MONO%: 8.6 % (ref 0.0–13.0)
NEUT#: 1.4 10*3/uL — ABNORMAL LOW (ref 1.5–6.5)
RDW: 17 % — ABNORMAL HIGH (ref 11.3–14.5)
lymph#: 1.9 10*3/uL (ref 0.9–3.3)

## 2007-03-17 LAB — PHOSPHORUS: Phosphorus: 3 mg/dL (ref 2.3–4.6)

## 2007-03-21 ENCOUNTER — Encounter: Admission: RE | Admit: 2007-03-21 | Discharge: 2007-03-21 | Payer: Self-pay | Admitting: Gynecology

## 2007-03-21 ENCOUNTER — Ambulatory Visit (HOSPITAL_COMMUNITY): Admission: RE | Admit: 2007-03-21 | Discharge: 2007-03-21 | Payer: Self-pay | Admitting: Internal Medicine

## 2007-03-26 ENCOUNTER — Other Ambulatory Visit: Admission: RE | Admit: 2007-03-26 | Discharge: 2007-03-26 | Payer: Self-pay | Admitting: Gynecology

## 2007-03-31 ENCOUNTER — Ambulatory Visit: Payer: Self-pay | Admitting: Internal Medicine

## 2007-03-31 LAB — CBC WITH DIFFERENTIAL/PLATELET
Eosinophils Absolute: 0 10*3/uL (ref 0.0–0.5)
HCT: 36 % (ref 34.8–46.6)
HGB: 12.6 g/dL (ref 11.6–15.9)
LYMPH%: 50.3 % — ABNORMAL HIGH (ref 14.0–48.0)
MONO#: 0.2 10*3/uL (ref 0.1–0.9)
NEUT#: 1 10*3/uL — ABNORMAL LOW (ref 1.5–6.5)
NEUT%: 38.6 % — ABNORMAL LOW (ref 39.6–76.8)
Platelets: 259 10*3/uL (ref 145–400)
WBC: 2.6 10*3/uL — ABNORMAL LOW (ref 3.9–10.0)
lymph#: 1.3 10*3/uL (ref 0.9–3.3)

## 2007-03-31 LAB — COMPREHENSIVE METABOLIC PANEL
ALT: 46 U/L — ABNORMAL HIGH (ref 0–35)
CO2: 24 mEq/L (ref 19–32)
Calcium: 8.5 mg/dL (ref 8.4–10.5)
Chloride: 104 mEq/L (ref 96–112)
Creatinine, Ser: 0.82 mg/dL (ref 0.40–1.20)
Glucose, Bld: 105 mg/dL — ABNORMAL HIGH (ref 70–99)
Total Bilirubin: 0.5 mg/dL (ref 0.3–1.2)
Total Protein: 6.2 g/dL (ref 6.0–8.3)

## 2007-04-07 LAB — CBC WITH DIFFERENTIAL/PLATELET
BASO%: 0.5 % (ref 0.0–2.0)
EOS%: 0.7 % (ref 0.0–7.0)
LYMPH%: 54.8 % — ABNORMAL HIGH (ref 14.0–48.0)
MCH: 31.9 pg (ref 26.0–34.0)
MCHC: 34 g/dL (ref 32.0–36.0)
MCV: 93.8 fL (ref 81.0–101.0)
MONO%: 6 % (ref 0.0–13.0)
Platelets: 288 10*3/uL (ref 145–400)
RBC: 3.74 10*6/uL (ref 3.70–5.32)
WBC: 3 10*3/uL — ABNORMAL LOW (ref 3.9–10.0)

## 2007-04-07 LAB — COMPREHENSIVE METABOLIC PANEL
ALT: 35 U/L (ref 0–35)
Alkaline Phosphatase: 81 U/L (ref 39–117)
Sodium: 138 mEq/L (ref 135–145)
Total Bilirubin: 0.4 mg/dL (ref 0.3–1.2)
Total Protein: 6.1 g/dL (ref 6.0–8.3)

## 2007-04-14 LAB — MAGNESIUM: Magnesium: 1.8 mg/dL (ref 1.5–2.5)

## 2007-04-14 LAB — CBC WITH DIFFERENTIAL/PLATELET
Basophils Absolute: 0 10*3/uL (ref 0.0–0.1)
EOS%: 0.9 % (ref 0.0–7.0)
Eosinophils Absolute: 0 10*3/uL (ref 0.0–0.5)
HCT: 35.1 % (ref 34.8–46.6)
HGB: 12.1 g/dL (ref 11.6–15.9)
MCH: 32.8 pg (ref 26.0–34.0)
MCV: 94.9 fL (ref 81.0–101.0)
MONO%: 8 % (ref 0.0–13.0)
NEUT#: 1.4 10*3/uL — ABNORMAL LOW (ref 1.5–6.5)
NEUT%: 42 % (ref 39.6–76.8)
lymph#: 1.6 10*3/uL (ref 0.9–3.3)

## 2007-04-14 LAB — COMPREHENSIVE METABOLIC PANEL
ALT: 37 U/L — ABNORMAL HIGH (ref 0–35)
CO2: 27 mEq/L (ref 19–32)
Creatinine, Ser: 0.76 mg/dL (ref 0.40–1.20)
Total Bilirubin: 0.5 mg/dL (ref 0.3–1.2)

## 2007-04-21 LAB — CBC WITH DIFFERENTIAL/PLATELET
Basophils Absolute: 0 10*3/uL (ref 0.0–0.1)
Eosinophils Absolute: 0 10*3/uL (ref 0.0–0.5)
HGB: 12.4 g/dL (ref 11.6–15.9)
MCV: 95.3 fL (ref 81.0–101.0)
MONO#: 0.4 10*3/uL (ref 0.1–0.9)
NEUT#: 1.7 10*3/uL (ref 1.5–6.5)
RDW: 17.1 % — ABNORMAL HIGH (ref 11.3–14.5)
WBC: 4.1 10*3/uL (ref 3.9–10.0)
lymph#: 1.9 10*3/uL (ref 0.9–3.3)

## 2007-04-21 LAB — COMPREHENSIVE METABOLIC PANEL
Albumin: 3.8 g/dL (ref 3.5–5.2)
BUN: 12 mg/dL (ref 6–23)
CO2: 27 mEq/L (ref 19–32)
Calcium: 8.6 mg/dL (ref 8.4–10.5)
Chloride: 105 mEq/L (ref 96–112)
Glucose, Bld: 93 mg/dL (ref 70–99)
Potassium: 4 mEq/L (ref 3.5–5.3)
Total Protein: 6.2 g/dL (ref 6.0–8.3)

## 2007-04-28 LAB — CBC WITH DIFFERENTIAL/PLATELET
Basophils Absolute: 0 10*3/uL (ref 0.0–0.1)
Eosinophils Absolute: 0 10*3/uL (ref 0.0–0.5)
HCT: 36.3 % (ref 34.8–46.6)
HGB: 12.6 g/dL (ref 11.6–15.9)
LYMPH%: 38.8 % (ref 14.0–48.0)
MCV: 94.3 fL (ref 81.0–101.0)
MONO%: 10.1 % (ref 0.0–13.0)
NEUT#: 2.3 10*3/uL (ref 1.5–6.5)
Platelets: 334 10*3/uL (ref 145–400)

## 2007-04-28 LAB — MAGNESIUM: Magnesium: 2 mg/dL (ref 1.5–2.5)

## 2007-04-28 LAB — COMPREHENSIVE METABOLIC PANEL
Albumin: 4.1 g/dL (ref 3.5–5.2)
Alkaline Phosphatase: 97 U/L (ref 39–117)
BUN: 12 mg/dL (ref 6–23)
CO2: 26 mEq/L (ref 19–32)
Glucose, Bld: 86 mg/dL (ref 70–99)
Potassium: 4.2 mEq/L (ref 3.5–5.3)
Total Bilirubin: 0.5 mg/dL (ref 0.3–1.2)

## 2007-04-28 LAB — PHOSPHORUS: Phosphorus: 3.7 mg/dL (ref 2.3–4.6)

## 2007-05-12 ENCOUNTER — Ambulatory Visit: Payer: Self-pay | Admitting: Internal Medicine

## 2007-05-12 LAB — CBC WITH DIFFERENTIAL/PLATELET
Eosinophils Absolute: 0.1 10*3/uL (ref 0.0–0.5)
HCT: 34.7 % — ABNORMAL LOW (ref 34.8–46.6)
LYMPH%: 36.2 % (ref 14.0–48.0)
MONO#: 0.5 10*3/uL (ref 0.1–0.9)
NEUT#: 3.2 10*3/uL (ref 1.5–6.5)
NEUT%: 53.9 % (ref 39.6–76.8)
Platelets: 404 10*3/uL — ABNORMAL HIGH (ref 145–400)
WBC: 6 10*3/uL (ref 3.9–10.0)
lymph#: 2.2 10*3/uL (ref 0.9–3.3)

## 2007-05-12 LAB — COMPREHENSIVE METABOLIC PANEL
ALT: 20 U/L (ref 0–35)
CO2: 28 mEq/L (ref 19–32)
Calcium: 8.8 mg/dL (ref 8.4–10.5)
Chloride: 101 mEq/L (ref 96–112)
Creatinine, Ser: 0.73 mg/dL (ref 0.40–1.20)
Glucose, Bld: 92 mg/dL (ref 70–99)
Sodium: 139 mEq/L (ref 135–145)
Total Bilirubin: 0.4 mg/dL (ref 0.3–1.2)
Total Protein: 6.4 g/dL (ref 6.0–8.3)

## 2007-05-12 LAB — MAGNESIUM: Magnesium: 2 mg/dL (ref 1.5–2.5)

## 2007-05-13 ENCOUNTER — Ambulatory Visit (HOSPITAL_COMMUNITY): Admission: RE | Admit: 2007-05-13 | Discharge: 2007-05-13 | Payer: Self-pay | Admitting: Internal Medicine

## 2007-05-26 ENCOUNTER — Encounter: Payer: Self-pay | Admitting: Family Medicine

## 2007-05-26 LAB — COMPREHENSIVE METABOLIC PANEL
ALT: 18 U/L (ref 0–35)
AST: 19 U/L (ref 0–37)
Albumin: 3.9 g/dL (ref 3.5–5.2)
CO2: 27 mEq/L (ref 19–32)
Calcium: 9.1 mg/dL (ref 8.4–10.5)
Chloride: 103 mEq/L (ref 96–112)
Potassium: 4.3 mEq/L (ref 3.5–5.3)
Sodium: 139 mEq/L (ref 135–145)
Total Protein: 6.9 g/dL (ref 6.0–8.3)

## 2007-05-26 LAB — CBC WITH DIFFERENTIAL/PLATELET
BASO%: 0.5 % (ref 0.0–2.0)
EOS%: 1 % (ref 0.0–7.0)
HCT: 37.3 % (ref 34.8–46.6)
MCH: 30.8 pg (ref 26.0–34.0)
MCHC: 34.5 g/dL (ref 32.0–36.0)
MONO#: 0.4 10*3/uL (ref 0.1–0.9)
NEUT%: 50.1 % (ref 39.6–76.8)
RDW: 14.6 % — ABNORMAL HIGH (ref 11.3–14.5)
WBC: 4.9 10*3/uL (ref 3.9–10.0)
lymph#: 2 10*3/uL (ref 0.9–3.3)

## 2007-05-26 LAB — MAGNESIUM: Magnesium: 2 mg/dL (ref 1.5–2.5)

## 2007-06-02 LAB — COMPREHENSIVE METABOLIC PANEL
Albumin: 3.9 g/dL (ref 3.5–5.2)
Alkaline Phosphatase: 86 U/L (ref 39–117)
BUN: 15 mg/dL (ref 6–23)
CO2: 26 mEq/L (ref 19–32)
Glucose, Bld: 98 mg/dL (ref 70–99)
Potassium: 4.4 mEq/L (ref 3.5–5.3)
Sodium: 143 mEq/L (ref 135–145)
Total Protein: 6.7 g/dL (ref 6.0–8.3)

## 2007-06-02 LAB — CBC WITH DIFFERENTIAL/PLATELET
Basophils Absolute: 0 10*3/uL (ref 0.0–0.1)
Eosinophils Absolute: 0.1 10*3/uL (ref 0.0–0.5)
HGB: 12.1 g/dL (ref 11.6–15.9)
LYMPH%: 37.2 % (ref 14.0–48.0)
MCV: 87.7 fL (ref 81.0–101.0)
MONO#: 0.1 10*3/uL (ref 0.1–0.9)
MONO%: 3.1 % (ref 0.0–13.0)
NEUT#: 2.3 10*3/uL (ref 1.5–6.5)
Platelets: 341 10*3/uL (ref 145–400)
RDW: 14.5 % (ref 11.3–14.5)
WBC: 4 10*3/uL (ref 3.9–10.0)

## 2007-06-02 LAB — PHOSPHORUS: Phosphorus: 3.1 mg/dL (ref 2.3–4.6)

## 2007-06-02 LAB — MAGNESIUM: Magnesium: 2 mg/dL (ref 1.5–2.5)

## 2007-06-10 LAB — COMPREHENSIVE METABOLIC PANEL
ALT: 12 U/L (ref 0–35)
AST: 14 U/L (ref 0–37)
CO2: 28 mEq/L (ref 19–32)
Calcium: 8.9 mg/dL (ref 8.4–10.5)
Chloride: 103 mEq/L (ref 96–112)
Potassium: 4 mEq/L (ref 3.5–5.3)
Sodium: 141 mEq/L (ref 135–145)
Total Protein: 6.5 g/dL (ref 6.0–8.3)

## 2007-06-10 LAB — CBC WITH DIFFERENTIAL/PLATELET
BASO%: 1.1 % (ref 0.0–2.0)
EOS%: 2.2 % (ref 0.0–7.0)
HCT: 33.7 % — ABNORMAL LOW (ref 34.8–46.6)
MCH: 29.9 pg (ref 26.0–34.0)
MCHC: 34.1 g/dL (ref 32.0–36.0)
MONO#: 0 10*3/uL — ABNORMAL LOW (ref 0.1–0.9)
RBC: 3.85 10*6/uL (ref 3.70–5.32)
RDW: 13.6 % (ref 11.3–14.5)
WBC: 2.8 10*3/uL — ABNORMAL LOW (ref 3.9–10.0)
lymph#: 1.4 10*3/uL (ref 0.9–3.3)

## 2007-06-10 LAB — MAGNESIUM: Magnesium: 1.9 mg/dL (ref 1.5–2.5)

## 2007-06-10 LAB — PHOSPHORUS: Phosphorus: 2.7 mg/dL (ref 2.3–4.6)

## 2007-06-16 LAB — CBC WITH DIFFERENTIAL/PLATELET
BASO%: 0 % (ref 0.0–2.0)
EOS%: 2.8 % (ref 0.0–7.0)
MCH: 29.8 pg (ref 26.0–34.0)
MCHC: 34.7 g/dL (ref 32.0–36.0)
NEUT%: 36.1 % — ABNORMAL LOW (ref 39.6–76.8)
RDW: 16 % — ABNORMAL HIGH (ref 11.3–14.5)
lymph#: 1.2 10*3/uL (ref 0.9–3.3)

## 2007-06-16 LAB — MAGNESIUM: Magnesium: 1.9 mg/dL (ref 1.5–2.5)

## 2007-06-16 LAB — COMPREHENSIVE METABOLIC PANEL
AST: 15 U/L (ref 0–37)
Albumin: 3.8 g/dL (ref 3.5–5.2)
Alkaline Phosphatase: 88 U/L (ref 39–117)
BUN: 13 mg/dL (ref 6–23)
Potassium: 4.1 mEq/L (ref 3.5–5.3)
Sodium: 139 mEq/L (ref 135–145)

## 2007-06-19 LAB — CBC WITH DIFFERENTIAL/PLATELET
Basophils Absolute: 0 10*3/uL (ref 0.0–0.1)
Eosinophils Absolute: 0.1 10*3/uL (ref 0.0–0.5)
HGB: 11.1 g/dL — ABNORMAL LOW (ref 11.6–15.9)
MCV: 85.3 fL (ref 81.0–101.0)
MONO#: 0 10*3/uL — ABNORMAL LOW (ref 0.1–0.9)
NEUT#: 0.6 10*3/uL — ABNORMAL LOW (ref 1.5–6.5)
RBC: 3.79 10*6/uL (ref 3.70–5.32)
RDW: 14.6 % — ABNORMAL HIGH (ref 11.3–14.5)
WBC: 2 10*3/uL — ABNORMAL LOW (ref 3.9–10.0)
lymph#: 1.3 10*3/uL (ref 0.9–3.3)

## 2007-06-23 LAB — CBC WITH DIFFERENTIAL/PLATELET
Basophils Absolute: 0.1 10*3/uL (ref 0.0–0.1)
Eosinophils Absolute: 0.1 10*3/uL (ref 0.0–0.5)
HCT: 32.8 % — ABNORMAL LOW (ref 34.8–46.6)
HGB: 11.2 g/dL — ABNORMAL LOW (ref 11.6–15.9)
LYMPH%: 19.6 % (ref 14.0–48.0)
MCV: 86.4 fL (ref 81.0–101.0)
MONO#: 0.9 10*3/uL (ref 0.1–0.9)
MONO%: 7.3 % (ref 0.0–13.0)
NEUT#: 9.1 10*3/uL — ABNORMAL HIGH (ref 1.5–6.5)
Platelets: 355 10*3/uL (ref 145–400)
WBC: 12.7 10*3/uL — ABNORMAL HIGH (ref 3.9–10.0)

## 2007-06-23 LAB — MAGNESIUM: Magnesium: 1.9 mg/dL (ref 1.5–2.5)

## 2007-06-23 LAB — COMPREHENSIVE METABOLIC PANEL
Albumin: 3.8 g/dL (ref 3.5–5.2)
Alkaline Phosphatase: 97 U/L (ref 39–117)
BUN: 12 mg/dL (ref 6–23)
CO2: 26 mEq/L (ref 19–32)
Glucose, Bld: 119 mg/dL — ABNORMAL HIGH (ref 70–99)
Sodium: 143 mEq/L (ref 135–145)
Total Bilirubin: 0.5 mg/dL (ref 0.3–1.2)
Total Protein: 6.4 g/dL (ref 6.0–8.3)

## 2007-06-30 ENCOUNTER — Ambulatory Visit: Payer: Self-pay | Admitting: Internal Medicine

## 2007-06-30 LAB — COMPREHENSIVE METABOLIC PANEL
AST: 15 U/L (ref 0–37)
Albumin: 3.8 g/dL (ref 3.5–5.2)
Alkaline Phosphatase: 86 U/L (ref 39–117)
Potassium: 4.3 mEq/L (ref 3.5–5.3)
Sodium: 140 mEq/L (ref 135–145)
Total Bilirubin: 0.3 mg/dL (ref 0.3–1.2)
Total Protein: 6.6 g/dL (ref 6.0–8.3)

## 2007-06-30 LAB — CBC WITH DIFFERENTIAL/PLATELET
EOS%: 0.1 % (ref 0.0–7.0)
LYMPH%: 47.2 % (ref 14.0–48.0)
MCH: 29.3 pg (ref 26.0–34.0)
MCHC: 34.8 g/dL (ref 32.0–36.0)
MCV: 84.3 fL (ref 81.0–101.0)
MONO%: 9.9 % (ref 0.0–13.0)
Platelets: 244 10*3/uL (ref 145–400)
RBC: 3.91 10*6/uL (ref 3.70–5.32)
RDW: 17.8 % — ABNORMAL HIGH (ref 11.3–14.5)

## 2007-06-30 LAB — PHOSPHORUS: Phosphorus: 3.4 mg/dL (ref 2.3–4.6)

## 2007-07-14 LAB — COMPREHENSIVE METABOLIC PANEL
ALT: 14 U/L (ref 0–35)
AST: 18 U/L (ref 0–37)
Albumin: 3.9 g/dL (ref 3.5–5.2)
Alkaline Phosphatase: 88 U/L (ref 39–117)
Glucose, Bld: 93 mg/dL (ref 70–99)
Potassium: 4.6 mEq/L (ref 3.5–5.3)
Sodium: 142 mEq/L (ref 135–145)
Total Bilirubin: 0.4 mg/dL (ref 0.3–1.2)
Total Protein: 6.7 g/dL (ref 6.0–8.3)

## 2007-07-21 ENCOUNTER — Ambulatory Visit (HOSPITAL_COMMUNITY): Admission: RE | Admit: 2007-07-21 | Discharge: 2007-07-21 | Payer: Self-pay | Admitting: Internal Medicine

## 2007-07-21 LAB — CBC WITH DIFFERENTIAL/PLATELET
BASO%: 0.3 % (ref 0.0–2.0)
Basophils Absolute: 0 10*3/uL (ref 0.0–0.1)
EOS%: 10.3 % — ABNORMAL HIGH (ref 0.0–7.0)
HCT: 30.1 % — ABNORMAL LOW (ref 34.8–46.6)
HGB: 10.4 g/dL — ABNORMAL LOW (ref 11.6–15.9)
LYMPH%: 36.3 % (ref 14.0–48.0)
MCH: 28.9 pg (ref 26.0–34.0)
MCHC: 34.5 g/dL (ref 32.0–36.0)
MCV: 83.7 fL (ref 81.0–101.0)
NEUT%: 49.8 % (ref 39.6–76.8)
Platelets: 365 10*3/uL (ref 145–400)
lymph#: 0.9 10*3/uL (ref 0.9–3.3)

## 2007-07-21 LAB — COMPREHENSIVE METABOLIC PANEL
ALT: 14 U/L (ref 0–35)
AST: 14 U/L (ref 0–37)
BUN: 11 mg/dL (ref 6–23)
CO2: 26 mEq/L (ref 19–32)
Calcium: 8.8 mg/dL (ref 8.4–10.5)
Chloride: 104 mEq/L (ref 96–112)
Creatinine, Ser: 0.7 mg/dL (ref 0.40–1.20)
Total Bilirubin: 0.4 mg/dL (ref 0.3–1.2)

## 2007-07-21 LAB — PHOSPHORUS: Phosphorus: 3.3 mg/dL (ref 2.3–4.6)

## 2007-07-21 LAB — MAGNESIUM: Magnesium: 1.9 mg/dL (ref 1.5–2.5)

## 2007-07-28 ENCOUNTER — Encounter: Payer: Self-pay | Admitting: Family Medicine

## 2007-07-28 LAB — CBC WITH DIFFERENTIAL/PLATELET
BASO%: 0.7 % (ref 0.0–2.0)
Eosinophils Absolute: 0 10*3/uL (ref 0.0–0.5)
LYMPH%: 39.4 % (ref 14.0–48.0)
MCHC: 33.7 g/dL (ref 32.0–36.0)
MONO#: 0.3 10*3/uL (ref 0.1–0.9)
NEUT#: 2.1 10*3/uL (ref 1.5–6.5)
Platelets: 427 10*3/uL — ABNORMAL HIGH (ref 145–400)
RBC: 4.04 10*6/uL (ref 3.70–5.32)
RDW: 22.1 % — ABNORMAL HIGH (ref 11.3–14.5)
WBC: 4 10*3/uL (ref 3.9–10.0)

## 2007-07-28 LAB — COMPREHENSIVE METABOLIC PANEL
ALT: 11 U/L (ref 0–35)
Albumin: 3.8 g/dL (ref 3.5–5.2)
Alkaline Phosphatase: 87 U/L (ref 39–117)
CO2: 27 mEq/L (ref 19–32)
Potassium: 4.2 mEq/L (ref 3.5–5.3)
Sodium: 140 mEq/L (ref 135–145)
Total Bilirubin: 0.5 mg/dL (ref 0.3–1.2)
Total Protein: 6.9 g/dL (ref 6.0–8.3)

## 2007-07-28 LAB — MAGNESIUM: Magnesium: 2 mg/dL (ref 1.5–2.5)

## 2007-08-04 ENCOUNTER — Ambulatory Visit: Payer: Self-pay | Admitting: Family Medicine

## 2007-08-04 LAB — CBC WITH DIFFERENTIAL/PLATELET
Basophils Absolute: 0 10*3/uL (ref 0.0–0.1)
Eosinophils Absolute: 0.1 10*3/uL (ref 0.0–0.5)
HGB: 10.8 g/dL — ABNORMAL LOW (ref 11.6–15.9)
LYMPH%: 47.5 % (ref 14.0–48.0)
MCV: 81.6 fL (ref 81.0–101.0)
MONO#: 0.2 10*3/uL (ref 0.1–0.9)
MONO%: 5.2 % (ref 0.0–13.0)
NEUT#: 1.7 10*3/uL (ref 1.5–6.5)
Platelets: 568 10*3/uL — ABNORMAL HIGH (ref 145–400)
RDW: 22.9 % — ABNORMAL HIGH (ref 11.3–14.5)

## 2007-08-04 LAB — COMPREHENSIVE METABOLIC PANEL
Albumin: 3.9 g/dL (ref 3.5–5.2)
Alkaline Phosphatase: 85 U/L (ref 39–117)
BUN: 13 mg/dL (ref 6–23)
CO2: 27 mEq/L (ref 19–32)
Calcium: 9.1 mg/dL (ref 8.4–10.5)
Glucose, Bld: 92 mg/dL (ref 70–99)
Potassium: 4.5 mEq/L (ref 3.5–5.3)

## 2007-08-04 LAB — PHOSPHORUS: Phosphorus: 3.7 mg/dL (ref 2.3–4.6)

## 2007-08-04 LAB — MAGNESIUM: Magnesium: 2 mg/dL (ref 1.5–2.5)

## 2007-08-11 LAB — CBC WITH DIFFERENTIAL/PLATELET
Basophils Absolute: 0 10*3/uL (ref 0.0–0.1)
EOS%: 1.9 % (ref 0.0–7.0)
Eosinophils Absolute: 0.1 10*3/uL (ref 0.0–0.5)
HCT: 30.3 % — ABNORMAL LOW (ref 34.8–46.6)
HGB: 10 g/dL — ABNORMAL LOW (ref 11.6–15.9)
LYMPH%: 46.1 % (ref 14.0–48.0)
MCH: 27.3 pg (ref 26.0–34.0)
MCV: 82.3 fL (ref 81.0–101.0)
MONO%: 0.6 % (ref 0.0–13.0)
NEUT%: 49.8 % (ref 39.6–76.8)
Platelets: 315 10*3/uL (ref 145–400)

## 2007-08-11 LAB — COMPREHENSIVE METABOLIC PANEL
AST: 18 U/L (ref 0–37)
Alkaline Phosphatase: 81 U/L (ref 39–117)
BUN: 12 mg/dL (ref 6–23)
Creatinine, Ser: 0.62 mg/dL (ref 0.40–1.20)
Glucose, Bld: 92 mg/dL (ref 70–99)

## 2007-08-13 ENCOUNTER — Encounter: Payer: Self-pay | Admitting: Family Medicine

## 2007-08-14 ENCOUNTER — Ambulatory Visit: Payer: Self-pay | Admitting: Internal Medicine

## 2007-08-14 DIAGNOSIS — F339 Major depressive disorder, recurrent, unspecified: Secondary | ICD-10-CM | POA: Insufficient documentation

## 2007-08-14 DIAGNOSIS — F32A Depression, unspecified: Secondary | ICD-10-CM | POA: Insufficient documentation

## 2007-08-18 LAB — CBC WITH DIFFERENTIAL/PLATELET
BASO%: 0.6 % (ref 0.0–2.0)
Basophils Absolute: 0 10e3/uL (ref 0.0–0.1)
EOS%: 4.5 % (ref 0.0–7.0)
Eosinophils Absolute: 0.2 10e3/uL (ref 0.0–0.5)
HCT: 28.2 % — ABNORMAL LOW (ref 34.8–46.6)
HGB: 9.6 g/dL — ABNORMAL LOW (ref 11.6–15.9)
LYMPH%: 42 % (ref 14.0–48.0)
MCH: 28.3 pg (ref 26.0–34.0)
MCHC: 34.2 g/dL (ref 32.0–36.0)
MCV: 82.8 fL (ref 81.0–101.0)
MONO#: 0.1 10e3/uL (ref 0.1–0.9)
MONO%: 2.5 % (ref 0.0–13.0)
NEUT#: 1.7 10e3/uL (ref 1.5–6.5)
NEUT%: 50.4 % (ref 39.6–76.8)
Platelets: 346 10e3/uL (ref 145–400)
RBC: 3.4 10e6/uL — ABNORMAL LOW (ref 3.70–5.32)
RDW: 25.8 % — ABNORMAL HIGH (ref 11.3–14.5)
WBC: 3.3 10e3/uL — ABNORMAL LOW (ref 3.9–10.0)
lymph#: 1.4 10e3/uL (ref 0.9–3.3)

## 2007-08-18 LAB — COMPREHENSIVE METABOLIC PANEL
AST: 18 U/L (ref 0–37)
Albumin: 3.8 g/dL (ref 3.5–5.2)
BUN: 12 mg/dL (ref 6–23)
Calcium: 8.8 mg/dL (ref 8.4–10.5)
Chloride: 106 mEq/L (ref 96–112)
Creatinine, Ser: 0.67 mg/dL (ref 0.40–1.20)
Glucose, Bld: 88 mg/dL (ref 70–99)
Potassium: 4 mEq/L (ref 3.5–5.3)

## 2007-09-08 LAB — CBC WITH DIFFERENTIAL/PLATELET
BASO%: 0.6 % (ref 0.0–2.0)
Basophils Absolute: 0 10*3/uL (ref 0.0–0.1)
HCT: 32.1 % — ABNORMAL LOW (ref 34.8–46.6)
LYMPH%: 35.2 % (ref 14.0–48.0)
MCH: 28.6 pg (ref 26.0–34.0)
MCHC: 34.1 g/dL (ref 32.0–36.0)
MONO#: 0.1 10*3/uL (ref 0.1–0.9)
NEUT%: 58.1 % (ref 39.6–76.8)
Platelets: 379 10*3/uL (ref 145–400)
WBC: 3.4 10*3/uL — ABNORMAL LOW (ref 3.9–10.0)

## 2007-09-08 LAB — COMPREHENSIVE METABOLIC PANEL
ALT: 13 U/L (ref 0–35)
AST: 13 U/L (ref 0–37)
Alkaline Phosphatase: 90 U/L (ref 39–117)
Creatinine, Ser: 0.63 mg/dL (ref 0.40–1.20)
Total Bilirubin: 0.4 mg/dL (ref 0.3–1.2)

## 2007-09-15 LAB — CBC WITH DIFFERENTIAL/PLATELET
BASO%: 0.4 % (ref 0.0–2.0)
EOS%: 3.4 % (ref 0.0–7.0)
HCT: 30.1 % — ABNORMAL LOW (ref 34.8–46.6)
LYMPH%: 33 % (ref 14.0–48.0)
MCH: 29.1 pg (ref 26.0–34.0)
MCHC: 33.8 g/dL (ref 32.0–36.0)
MONO#: 0.1 10*3/uL (ref 0.1–0.9)
NEUT%: 60.5 % (ref 39.6–76.8)
Platelets: 382 10*3/uL (ref 145–400)

## 2007-09-15 LAB — COMPREHENSIVE METABOLIC PANEL
ALT: 15 U/L (ref 0–35)
CO2: 26 mEq/L (ref 19–32)
Creatinine, Ser: 0.64 mg/dL (ref 0.40–1.20)
Total Bilirubin: 0.5 mg/dL (ref 0.3–1.2)

## 2007-09-18 ENCOUNTER — Ambulatory Visit (HOSPITAL_COMMUNITY): Admission: RE | Admit: 2007-09-18 | Discharge: 2007-09-18 | Payer: Self-pay | Admitting: Internal Medicine

## 2007-09-25 ENCOUNTER — Encounter: Payer: Self-pay | Admitting: Family Medicine

## 2007-09-25 LAB — COMPREHENSIVE METABOLIC PANEL
BUN: 13 mg/dL (ref 6–23)
CO2: 25 mEq/L (ref 19–32)
Calcium: 9 mg/dL (ref 8.4–10.5)
Creatinine, Ser: 0.64 mg/dL (ref 0.40–1.20)
Glucose, Bld: 116 mg/dL — ABNORMAL HIGH (ref 70–99)
Total Bilirubin: 0.4 mg/dL (ref 0.3–1.2)

## 2007-09-25 LAB — CBC WITH DIFFERENTIAL/PLATELET
BASO%: 0.5 % (ref 0.0–2.0)
Basophils Absolute: 0 10*3/uL (ref 0.0–0.1)
Eosinophils Absolute: 0.1 10*3/uL (ref 0.0–0.5)
HCT: 32.5 % — ABNORMAL LOW (ref 34.8–46.6)
HGB: 11 g/dL — ABNORMAL LOW (ref 11.6–15.9)
LYMPH%: 34.6 % (ref 14.0–48.0)
MCHC: 33.8 g/dL (ref 32.0–36.0)
MONO#: 0.7 10*3/uL (ref 0.1–0.9)
NEUT%: 48.9 % (ref 39.6–76.8)
Platelets: 365 10*3/uL (ref 145–400)
WBC: 4.9 10*3/uL (ref 3.9–10.0)

## 2007-10-02 ENCOUNTER — Ambulatory Visit: Payer: Self-pay | Admitting: Internal Medicine

## 2007-10-06 LAB — CBC WITH DIFFERENTIAL/PLATELET
Eosinophils Absolute: 0.1 10*3/uL (ref 0.0–0.5)
HCT: 34 % — ABNORMAL LOW (ref 34.8–46.6)
HGB: 11.4 g/dL — ABNORMAL LOW (ref 11.6–15.9)
LYMPH%: 30.1 % (ref 14.0–48.0)
MONO#: 0.1 10*3/uL (ref 0.1–0.9)
NEUT#: 2.7 10*3/uL (ref 1.5–6.5)
NEUT%: 64.2 % (ref 39.6–76.8)
Platelets: 398 10*3/uL (ref 145–400)
WBC: 4.1 10*3/uL (ref 3.9–10.0)
lymph#: 1.2 10*3/uL (ref 0.9–3.3)

## 2007-10-06 LAB — COMPREHENSIVE METABOLIC PANEL
AST: 21 U/L (ref 0–37)
Albumin: 4 g/dL (ref 3.5–5.2)
Alkaline Phosphatase: 91 U/L (ref 39–117)
BUN: 10 mg/dL (ref 6–23)
Creatinine, Ser: 0.63 mg/dL (ref 0.40–1.20)
Potassium: 4.1 mEq/L (ref 3.5–5.3)
Total Bilirubin: 0.4 mg/dL (ref 0.3–1.2)

## 2007-10-13 LAB — COMPREHENSIVE METABOLIC PANEL
AST: 15 U/L (ref 0–37)
Albumin: 4.1 g/dL (ref 3.5–5.2)
Alkaline Phosphatase: 93 U/L (ref 39–117)
Calcium: 9.1 mg/dL (ref 8.4–10.5)
Chloride: 103 mEq/L (ref 96–112)
Glucose, Bld: 111 mg/dL — ABNORMAL HIGH (ref 70–99)
Potassium: 4.1 mEq/L (ref 3.5–5.3)
Sodium: 140 mEq/L (ref 135–145)
Total Protein: 6.9 g/dL (ref 6.0–8.3)

## 2007-10-13 LAB — CBC WITH DIFFERENTIAL/PLATELET
Eosinophils Absolute: 0.1 10*3/uL (ref 0.0–0.5)
HGB: 11.8 g/dL (ref 11.6–15.9)
MCV: 85.9 fL (ref 81.0–101.0)
MONO%: 1.6 % (ref 0.0–13.0)
NEUT#: 2.5 10*3/uL (ref 1.5–6.5)
RBC: 4.17 10*6/uL (ref 3.70–5.32)
RDW: 21.6 % — ABNORMAL HIGH (ref 11.3–14.5)
WBC: 4.2 10*3/uL (ref 3.9–10.0)
lymph#: 1.5 10*3/uL (ref 0.9–3.3)

## 2007-10-20 LAB — CBC WITH DIFFERENTIAL/PLATELET
Basophils Absolute: 0 10*3/uL (ref 0.0–0.1)
Eosinophils Absolute: 0.1 10*3/uL (ref 0.0–0.5)
HGB: 11.4 g/dL — ABNORMAL LOW (ref 11.6–15.9)
LYMPH%: 31.5 % (ref 14.0–48.0)
MCV: 86 fL (ref 81.0–101.0)
MONO#: 0.3 10*3/uL (ref 0.1–0.9)
MONO%: 7.4 % (ref 0.0–13.0)
NEUT#: 2.2 10*3/uL (ref 1.5–6.5)
Platelets: 414 10*3/uL — ABNORMAL HIGH (ref 145–400)
RDW: 23.5 % — ABNORMAL HIGH (ref 11.3–14.5)
WBC: 3.9 10*3/uL (ref 3.9–10.0)

## 2007-10-20 LAB — COMPREHENSIVE METABOLIC PANEL
Albumin: 3.9 g/dL (ref 3.5–5.2)
Alkaline Phosphatase: 90 U/L (ref 39–117)
BUN: 10 mg/dL (ref 6–23)
CO2: 26 mEq/L (ref 19–32)
Glucose, Bld: 118 mg/dL — ABNORMAL HIGH (ref 70–99)
Potassium: 4.1 mEq/L (ref 3.5–5.3)
Sodium: 140 mEq/L (ref 135–145)
Total Protein: 6.4 g/dL (ref 6.0–8.3)

## 2007-11-03 LAB — CBC WITH DIFFERENTIAL/PLATELET
Basophils Absolute: 0 10*3/uL (ref 0.0–0.1)
Eosinophils Absolute: 0.1 10*3/uL (ref 0.0–0.5)
HGB: 11.5 g/dL — ABNORMAL LOW (ref 11.6–15.9)
MCV: 84 fL (ref 81.0–101.0)
MONO#: 0.1 10*3/uL (ref 0.1–0.9)
NEUT#: 2.4 10*3/uL (ref 1.5–6.5)
RBC: 4.05 10*6/uL (ref 3.70–5.32)
RDW: 24.2 % — ABNORMAL HIGH (ref 11.3–14.5)
WBC: 3.7 10*3/uL — ABNORMAL LOW (ref 3.9–10.0)
lymph#: 1.1 10*3/uL (ref 0.9–3.3)

## 2007-11-03 LAB — COMPREHENSIVE METABOLIC PANEL
ALT: 15 U/L (ref 0–35)
AST: 16 U/L (ref 0–37)
Creatinine, Ser: 0.67 mg/dL (ref 0.40–1.20)
Total Bilirubin: 0.4 mg/dL (ref 0.3–1.2)

## 2007-11-10 LAB — CBC WITH DIFFERENTIAL/PLATELET
BASO%: 0.3 % (ref 0.0–2.0)
Eosinophils Absolute: 0.1 10*3/uL (ref 0.0–0.5)
HCT: 33 % — ABNORMAL LOW (ref 34.8–46.6)
HGB: 11.2 g/dL — ABNORMAL LOW (ref 11.6–15.9)
LYMPH%: 32.4 % (ref 14.0–48.0)
MCH: 28.9 pg (ref 26.0–34.0)
MCHC: 34 g/dL (ref 32.0–36.0)
MCV: 85.1 fL (ref 81.0–101.0)
MONO#: 0.1 10*3/uL (ref 0.1–0.9)
RBC: 3.88 10*6/uL (ref 3.70–5.32)
RDW: 24.1 % — ABNORMAL HIGH (ref 11.3–14.5)

## 2007-11-10 LAB — COMPREHENSIVE METABOLIC PANEL
ALT: 13 U/L (ref 0–35)
Albumin: 4 g/dL (ref 3.5–5.2)
Alkaline Phosphatase: 96 U/L (ref 39–117)
CO2: 28 mEq/L (ref 19–32)
Potassium: 3.9 mEq/L (ref 3.5–5.3)
Sodium: 140 mEq/L (ref 135–145)
Total Bilirubin: 0.6 mg/dL (ref 0.3–1.2)
Total Protein: 6.7 g/dL (ref 6.0–8.3)

## 2007-11-17 ENCOUNTER — Ambulatory Visit: Payer: Self-pay | Admitting: Internal Medicine

## 2007-11-17 LAB — CBC WITH DIFFERENTIAL/PLATELET
BASO%: 0.4 % (ref 0.0–2.0)
EOS%: 3.4 % (ref 0.0–7.0)
LYMPH%: 36.7 % (ref 14.0–48.0)
MCHC: 33.7 g/dL (ref 32.0–36.0)
MCV: 86.3 fL (ref 81.0–101.0)
MONO%: 7.2 % (ref 0.0–13.0)
Platelets: 352 10*3/uL (ref 145–400)
RBC: 3.9 10*6/uL (ref 3.70–5.32)
RDW: 23.7 % — ABNORMAL HIGH (ref 11.3–14.5)
WBC: 3.5 10*3/uL — ABNORMAL LOW (ref 3.9–10.0)

## 2007-11-17 LAB — COMPREHENSIVE METABOLIC PANEL
ALT: 13 U/L (ref 0–35)
AST: 18 U/L (ref 0–37)
Alkaline Phosphatase: 105 U/L (ref 39–117)
Sodium: 140 mEq/L (ref 135–145)
Total Bilirubin: 0.3 mg/dL (ref 0.3–1.2)
Total Protein: 6.2 g/dL (ref 6.0–8.3)

## 2007-11-20 ENCOUNTER — Ambulatory Visit (HOSPITAL_COMMUNITY): Admission: RE | Admit: 2007-11-20 | Discharge: 2007-11-20 | Payer: Self-pay | Admitting: Internal Medicine

## 2007-11-27 ENCOUNTER — Ambulatory Visit: Payer: Self-pay | Admitting: Family Medicine

## 2007-11-27 DIAGNOSIS — N760 Acute vaginitis: Secondary | ICD-10-CM | POA: Insufficient documentation

## 2007-11-27 LAB — CONVERTED CEMR LAB
Bacteria, UA: 0
Bilirubin Urine: NEGATIVE
Blood in Urine, dipstick: NEGATIVE
Casts: 0 /lpf
Epithelial cells, urine: 0 /lpf
Glucose, Urine, Semiquant: NEGATIVE
KOH Prep: 0
Ketones, urine, test strip: NEGATIVE
Mucus, UA: 0
Nitrite: NEGATIVE
RBC / HPF: 0
Specific Gravity, Urine: >=1.03
Urine crystals, microscopic: 0 /hpf
Urobilinogen, UA: 0.2
WBC Urine, dipstick: NEGATIVE
WBC, UA: 0 cells/hpf
pH: 5

## 2007-11-28 ENCOUNTER — Encounter: Payer: Self-pay | Admitting: Family Medicine

## 2007-12-01 LAB — CBC WITH DIFFERENTIAL/PLATELET
BASO%: 0.5 % (ref 0.0–2.0)
EOS%: 2.1 % (ref 0.0–7.0)
HCT: 34.3 % — ABNORMAL LOW (ref 34.8–46.6)
MCH: 28.3 pg (ref 26.0–34.0)
MCHC: 33.7 g/dL (ref 32.0–36.0)
NEUT%: 62.2 % (ref 39.6–76.8)
RBC: 4.09 10*6/uL (ref 3.70–5.32)
RDW: 23.3 % — ABNORMAL HIGH (ref 11.3–14.5)
lymph#: 1.3 10*3/uL (ref 0.9–3.3)

## 2007-12-01 LAB — COMPREHENSIVE METABOLIC PANEL
ALT: 18 U/L (ref 0–35)
AST: 17 U/L (ref 0–37)
Calcium: 8.9 mg/dL (ref 8.4–10.5)
Chloride: 104 mEq/L (ref 96–112)
Creatinine, Ser: 0.67 mg/dL (ref 0.40–1.20)

## 2007-12-08 LAB — CBC WITH DIFFERENTIAL/PLATELET
Basophils Absolute: 0 10*3/uL (ref 0.0–0.1)
Eosinophils Absolute: 0.1 10*3/uL (ref 0.0–0.5)
HCT: 33.2 % — ABNORMAL LOW (ref 34.8–46.6)
MCH: 28.5 pg (ref 26.0–34.0)
MCHC: 34 g/dL (ref 32.0–36.0)
MONO#: 0.1 10*3/uL (ref 0.1–0.9)
Platelets: 348 10*3/uL (ref 145–400)
lymph#: 1.2 10*3/uL (ref 0.9–3.3)

## 2007-12-08 LAB — COMPREHENSIVE METABOLIC PANEL
Albumin: 3.9 g/dL (ref 3.5–5.2)
BUN: 12 mg/dL (ref 6–23)
CO2: 29 mEq/L (ref 19–32)
Calcium: 9.3 mg/dL (ref 8.4–10.5)
Chloride: 103 mEq/L (ref 96–112)
Glucose, Bld: 90 mg/dL (ref 70–99)
Potassium: 4.7 mEq/L (ref 3.5–5.3)
Sodium: 139 mEq/L (ref 135–145)
Total Protein: 7 g/dL (ref 6.0–8.3)

## 2007-12-16 LAB — COMPREHENSIVE METABOLIC PANEL
Alkaline Phosphatase: 107 U/L (ref 39–117)
BUN: 11 mg/dL (ref 6–23)
Glucose, Bld: 113 mg/dL — ABNORMAL HIGH (ref 70–99)
Sodium: 138 mEq/L (ref 135–145)
Total Bilirubin: 0.5 mg/dL (ref 0.3–1.2)

## 2007-12-16 LAB — CBC WITH DIFFERENTIAL/PLATELET
Basophils Absolute: 0 10*3/uL (ref 0.0–0.1)
Eosinophils Absolute: 0.2 10*3/uL (ref 0.0–0.5)
LYMPH%: 26.8 % (ref 14.0–48.0)
MCV: 84.1 fL (ref 81.0–101.0)
MONO%: 2 % (ref 0.0–13.0)
NEUT#: 2.4 10*3/uL (ref 1.5–6.5)
Platelets: 361 10*3/uL (ref 145–400)
RBC: 3.78 10*6/uL (ref 3.70–5.32)

## 2007-12-24 ENCOUNTER — Encounter: Payer: Self-pay | Admitting: Family Medicine

## 2007-12-24 LAB — CBC WITH DIFFERENTIAL/PLATELET
BASO%: 0.1 % (ref 0.0–2.0)
HCT: 32.6 % — ABNORMAL LOW (ref 34.8–46.6)
LYMPH%: 40.8 % (ref 14.0–48.0)
MCH: 28.5 pg (ref 26.0–34.0)
MCHC: 33.8 g/dL (ref 32.0–36.0)
MCV: 84.3 fL (ref 81.0–101.0)
MONO%: 11.3 % (ref 0.0–13.0)
NEUT%: 45.4 % (ref 39.6–76.8)
Platelets: 376 10*3/uL (ref 145–400)
RBC: 3.86 10*6/uL (ref 3.70–5.32)

## 2007-12-24 LAB — COMPREHENSIVE METABOLIC PANEL
ALT: 13 U/L (ref 0–35)
Alkaline Phosphatase: 102 U/L (ref 39–117)
Creatinine, Ser: 0.66 mg/dL (ref 0.40–1.20)
Sodium: 139 mEq/L (ref 135–145)
Total Bilirubin: 0.3 mg/dL (ref 0.3–1.2)
Total Protein: 6.7 g/dL (ref 6.0–8.3)

## 2007-12-29 ENCOUNTER — Ambulatory Visit: Payer: Self-pay | Admitting: Internal Medicine

## 2007-12-31 LAB — COMPREHENSIVE METABOLIC PANEL
Alkaline Phosphatase: 96 U/L (ref 39–117)
BUN: 15 mg/dL (ref 6–23)
CO2: 25 mEq/L (ref 19–32)
Creatinine, Ser: 0.64 mg/dL (ref 0.40–1.20)
Glucose, Bld: 109 mg/dL — ABNORMAL HIGH (ref 70–99)
Sodium: 137 mEq/L (ref 135–145)
Total Bilirubin: 0.5 mg/dL (ref 0.3–1.2)

## 2007-12-31 LAB — CBC WITH DIFFERENTIAL/PLATELET
Eosinophils Absolute: 0.1 10*3/uL (ref 0.0–0.5)
HCT: 31.6 % — ABNORMAL LOW (ref 34.8–46.6)
LYMPH%: 41.3 % (ref 14.0–48.0)
MCHC: 33.6 g/dL (ref 32.0–36.0)
MCV: 84.1 fL (ref 81.0–101.0)
MONO#: 0.2 10*3/uL (ref 0.1–0.9)
MONO%: 5.7 % (ref 0.0–13.0)
NEUT#: 2 10*3/uL (ref 1.5–6.5)
NEUT%: 50.2 % (ref 39.6–76.8)
Platelets: 409 10*3/uL — ABNORMAL HIGH (ref 145–400)
RBC: 3.76 10*6/uL (ref 3.70–5.32)
WBC: 3.9 10*3/uL (ref 3.9–10.0)

## 2008-01-05 LAB — COMPREHENSIVE METABOLIC PANEL
AST: 14 U/L (ref 0–37)
Alkaline Phosphatase: 97 U/L (ref 39–117)
BUN: 12 mg/dL (ref 6–23)
Creatinine, Ser: 0.67 mg/dL (ref 0.40–1.20)

## 2008-01-05 LAB — CBC WITH DIFFERENTIAL/PLATELET
BASO%: 0.5 % (ref 0.0–2.0)
EOS%: 2.7 % (ref 0.0–7.0)
HCT: 31.6 % — ABNORMAL LOW (ref 34.8–46.6)
HGB: 10.7 g/dL — ABNORMAL LOW (ref 11.6–15.9)
MCV: 84.2 fL (ref 81.0–101.0)
NEUT#: 2 10*3/uL (ref 1.5–6.5)
Platelets: 396 10*3/uL (ref 145–400)
RBC: 3.76 10*6/uL (ref 3.70–5.32)
WBC: 3.5 10*3/uL — ABNORMAL LOW (ref 3.9–10.0)

## 2008-01-13 LAB — COMPREHENSIVE METABOLIC PANEL
CO2: 27 mEq/L (ref 19–32)
Calcium: 8.9 mg/dL (ref 8.4–10.5)
Chloride: 104 mEq/L (ref 96–112)
Creatinine, Ser: 0.65 mg/dL (ref 0.40–1.20)
Glucose, Bld: 88 mg/dL (ref 70–99)
Total Bilirubin: 0.5 mg/dL (ref 0.3–1.2)

## 2008-01-13 LAB — CBC WITH DIFFERENTIAL/PLATELET
Basophils Absolute: 0 10*3/uL (ref 0.0–0.1)
Eosinophils Absolute: 0.1 10*3/uL (ref 0.0–0.5)
HCT: 31.3 % — ABNORMAL LOW (ref 34.8–46.6)
HGB: 10.6 g/dL — ABNORMAL LOW (ref 11.6–15.9)
LYMPH%: 34.9 % (ref 14.0–48.0)
MONO#: 0.1 10*3/uL (ref 0.1–0.9)
NEUT#: 2 10*3/uL (ref 1.5–6.5)
NEUT%: 58.9 % (ref 39.6–76.8)
Platelets: 344 10*3/uL (ref 145–400)
WBC: 3.5 10*3/uL — ABNORMAL LOW (ref 3.9–10.0)
lymph#: 1.2 10*3/uL (ref 0.9–3.3)

## 2008-01-15 ENCOUNTER — Ambulatory Visit (HOSPITAL_COMMUNITY): Admission: RE | Admit: 2008-01-15 | Discharge: 2008-01-15 | Payer: Self-pay | Admitting: Internal Medicine

## 2008-01-27 LAB — CBC WITH DIFFERENTIAL/PLATELET
BASO%: 0.4 % (ref 0.0–2.0)
Eosinophils Absolute: 0.1 10*3/uL (ref 0.0–0.5)
HCT: 32.7 % — ABNORMAL LOW (ref 34.8–46.6)
LYMPH%: 37.8 % (ref 14.0–48.0)
MCHC: 33.6 g/dL (ref 32.0–36.0)
MONO#: 0.2 10*3/uL (ref 0.1–0.9)
NEUT#: 1.8 10*3/uL (ref 1.5–6.5)
NEUT%: 54.8 % (ref 39.6–76.8)
Platelets: 370 10*3/uL (ref 145–400)
RBC: 3.87 10*6/uL (ref 3.70–5.32)
WBC: 3.3 10*3/uL — ABNORMAL LOW (ref 3.9–10.0)
lymph#: 1.2 10*3/uL (ref 0.9–3.3)

## 2008-01-27 LAB — COMPREHENSIVE METABOLIC PANEL
ALT: 22 U/L (ref 0–35)
Alkaline Phosphatase: 101 U/L (ref 39–117)
Potassium: 4.2 mEq/L (ref 3.5–5.3)
Sodium: 139 mEq/L (ref 135–145)
Total Bilirubin: 0.7 mg/dL (ref 0.3–1.2)
Total Protein: 6.5 g/dL (ref 6.0–8.3)

## 2008-02-03 LAB — CBC WITH DIFFERENTIAL/PLATELET
BASO%: 0.3 % (ref 0.0–2.0)
EOS%: 2.1 % (ref 0.0–7.0)
LYMPH%: 31.7 % (ref 14.0–48.0)
MCH: 28.5 pg (ref 26.0–34.0)
MCHC: 33.5 g/dL (ref 32.0–36.0)
MCV: 85.1 fL (ref 81.0–101.0)
MONO%: 2.6 % (ref 0.0–13.0)
Platelets: 360 10*3/uL (ref 145–400)
RBC: 3.52 10*6/uL — ABNORMAL LOW (ref 3.70–5.32)

## 2008-02-03 LAB — COMPREHENSIVE METABOLIC PANEL
AST: 17 U/L (ref 0–37)
Alkaline Phosphatase: 101 U/L (ref 39–117)
Glucose, Bld: 130 mg/dL — ABNORMAL HIGH (ref 70–99)
Sodium: 139 mEq/L (ref 135–145)
Total Bilirubin: 0.4 mg/dL (ref 0.3–1.2)
Total Protein: 6.4 g/dL (ref 6.0–8.3)

## 2008-02-10 LAB — COMPREHENSIVE METABOLIC PANEL
ALT: 16 U/L (ref 0–35)
AST: 15 U/L (ref 0–37)
Albumin: 4.1 g/dL (ref 3.5–5.2)
CO2: 24 mEq/L (ref 19–32)
Calcium: 9.6 mg/dL (ref 8.4–10.5)
Chloride: 104 mEq/L (ref 96–112)
Creatinine, Ser: 0.73 mg/dL (ref 0.40–1.20)
Potassium: 4 mEq/L (ref 3.5–5.3)
Sodium: 140 mEq/L (ref 135–145)
Total Protein: 7.1 g/dL (ref 6.0–8.3)

## 2008-02-10 LAB — CBC WITH DIFFERENTIAL/PLATELET
BASO%: 0.3 % (ref 0.0–2.0)
EOS%: 2.9 % (ref 0.0–7.0)
HCT: 33.2 % — ABNORMAL LOW (ref 34.8–46.6)
MCH: 29 pg (ref 26.0–34.0)
MCHC: 33.9 g/dL (ref 32.0–36.0)
MONO#: 0.1 10*3/uL (ref 0.1–0.9)
NEUT%: 55.5 % (ref 39.6–76.8)
RDW: 26 % — ABNORMAL HIGH (ref 11.3–14.5)
WBC: 2.9 10*3/uL — ABNORMAL LOW (ref 3.9–10.0)
lymph#: 1.1 10*3/uL (ref 0.9–3.3)

## 2008-02-13 ENCOUNTER — Ambulatory Visit: Payer: Self-pay | Admitting: Internal Medicine

## 2008-02-17 LAB — COMPREHENSIVE METABOLIC PANEL
Albumin: 3.9 g/dL (ref 3.5–5.2)
Alkaline Phosphatase: 110 U/L (ref 39–117)
BUN: 11 mg/dL (ref 6–23)
Glucose, Bld: 87 mg/dL (ref 70–99)
Potassium: 4.4 mEq/L (ref 3.5–5.3)
Total Bilirubin: 0.4 mg/dL (ref 0.3–1.2)

## 2008-02-17 LAB — CBC WITH DIFFERENTIAL/PLATELET
Basophils Absolute: 0 10*3/uL (ref 0.0–0.1)
Eosinophils Absolute: 0.1 10*3/uL (ref 0.0–0.5)
HCT: 32.5 % — ABNORMAL LOW (ref 34.8–46.6)
LYMPH%: 37.2 % (ref 14.0–48.0)
MCV: 85.6 fL (ref 81.0–101.0)
MONO%: 13.7 % — ABNORMAL HIGH (ref 0.0–13.0)
WBC: 3.9 10*3/uL (ref 3.9–10.0)

## 2008-02-24 LAB — COMPREHENSIVE METABOLIC PANEL
ALT: 15 U/L (ref 0–35)
AST: 17 U/L (ref 0–37)
Creatinine, Ser: 0.58 mg/dL (ref 0.40–1.20)
Total Bilirubin: 0.6 mg/dL (ref 0.3–1.2)

## 2008-02-24 LAB — CBC WITH DIFFERENTIAL/PLATELET
Basophils Absolute: 0 10*3/uL (ref 0.0–0.1)
Eosinophils Absolute: 0.1 10*3/uL (ref 0.0–0.5)
HGB: 10.9 g/dL — ABNORMAL LOW (ref 11.6–15.9)
MCV: 85.5 fL (ref 81.0–101.0)
MONO#: 0.2 10*3/uL (ref 0.1–0.9)
MONO%: 5.8 % (ref 0.0–13.0)
NEUT#: 2 10*3/uL (ref 1.5–6.5)
RDW: 24.6 % — ABNORMAL HIGH (ref 11.3–14.5)
WBC: 3.5 10*3/uL — ABNORMAL LOW (ref 3.9–10.0)
lymph#: 1.2 10*3/uL (ref 0.9–3.3)

## 2008-03-03 LAB — COMPREHENSIVE METABOLIC PANEL
ALT: 15 U/L (ref 0–35)
BUN: 14 mg/dL (ref 6–23)
CO2: 23 mEq/L (ref 19–32)
Creatinine, Ser: 0.63 mg/dL (ref 0.40–1.20)
Total Bilirubin: 0.4 mg/dL (ref 0.3–1.2)

## 2008-03-03 LAB — CBC WITH DIFFERENTIAL/PLATELET
BASO%: 0.4 % (ref 0.0–2.0)
Basophils Absolute: 0 10*3/uL (ref 0.0–0.1)
HCT: 31.1 % — ABNORMAL LOW (ref 34.8–46.6)
LYMPH%: 34.1 % (ref 14.0–48.0)
MCH: 29.1 pg (ref 26.0–34.0)
MCHC: 34.1 g/dL (ref 32.0–36.0)
MONO#: 0.1 10*3/uL (ref 0.1–0.9)
NEUT%: 59.1 % (ref 39.6–76.8)
Platelets: 373 10*3/uL (ref 145–400)
WBC: 4.1 10*3/uL (ref 3.9–10.0)

## 2008-03-10 LAB — COMPREHENSIVE METABOLIC PANEL
ALT: 15 U/L (ref 0–35)
Albumin: 4.1 g/dL (ref 3.5–5.2)
Alkaline Phosphatase: 102 U/L (ref 39–117)
CO2: 29 mEq/L (ref 19–32)
Glucose, Bld: 99 mg/dL (ref 70–99)
Potassium: 4.6 mEq/L (ref 3.5–5.3)
Sodium: 139 mEq/L (ref 135–145)
Total Protein: 6.7 g/dL (ref 6.0–8.3)

## 2008-03-10 LAB — CBC WITH DIFFERENTIAL/PLATELET
Eosinophils Absolute: 0.1 10*3/uL (ref 0.0–0.5)
MONO#: 0.2 10*3/uL (ref 0.1–0.9)
MONO%: 4.3 % (ref 0.0–13.0)
NEUT#: 2.1 10*3/uL (ref 1.5–6.5)
RBC: 3.72 10*6/uL (ref 3.70–5.32)
RDW: 24.9 % — ABNORMAL HIGH (ref 11.3–14.5)
WBC: 3.7 10*3/uL — ABNORMAL LOW (ref 3.9–10.0)

## 2008-03-16 LAB — CBC WITH DIFFERENTIAL/PLATELET
BASO%: 0.6 % (ref 0.0–2.0)
Basophils Absolute: 0 10*3/uL (ref 0.0–0.1)
EOS%: 2.9 % (ref 0.0–7.0)
HCT: 31.6 % — ABNORMAL LOW (ref 34.8–46.6)
HGB: 10.6 g/dL — ABNORMAL LOW (ref 11.6–15.9)
MCH: 28.9 pg (ref 26.0–34.0)
MCHC: 33.7 g/dL (ref 32.0–36.0)
MCV: 85.8 fL (ref 81.0–101.0)
MONO%: 12.3 % (ref 0.0–13.0)
NEUT%: 47.8 % (ref 39.6–76.8)

## 2008-03-16 LAB — COMPREHENSIVE METABOLIC PANEL
ALT: 16 U/L (ref 0–35)
AST: 21 U/L (ref 0–37)
Alkaline Phosphatase: 98 U/L (ref 39–117)
BUN: 13 mg/dL (ref 6–23)
Calcium: 8.9 mg/dL (ref 8.4–10.5)
Creatinine, Ser: 0.7 mg/dL (ref 0.40–1.20)
Total Bilirubin: 0.3 mg/dL (ref 0.3–1.2)

## 2008-03-18 ENCOUNTER — Ambulatory Visit (HOSPITAL_COMMUNITY): Admission: RE | Admit: 2008-03-18 | Discharge: 2008-03-18 | Payer: Self-pay | Admitting: Internal Medicine

## 2008-03-23 ENCOUNTER — Encounter: Admission: RE | Admit: 2008-03-23 | Discharge: 2008-03-23 | Payer: Self-pay | Admitting: Gynecology

## 2008-03-24 ENCOUNTER — Encounter: Payer: Self-pay | Admitting: Family Medicine

## 2008-03-26 ENCOUNTER — Other Ambulatory Visit: Admission: RE | Admit: 2008-03-26 | Discharge: 2008-03-26 | Payer: Self-pay | Admitting: Gynecology

## 2008-03-26 ENCOUNTER — Ambulatory Visit: Payer: Self-pay | Admitting: Gynecology

## 2008-03-26 ENCOUNTER — Encounter: Payer: Self-pay | Admitting: Gynecology

## 2008-03-30 ENCOUNTER — Ambulatory Visit: Payer: Self-pay | Admitting: Internal Medicine

## 2008-03-30 LAB — CBC WITH DIFFERENTIAL/PLATELET
Basophils Absolute: 0 10*3/uL (ref 0.0–0.1)
EOS%: 1.6 % (ref 0.0–7.0)
HCT: 34.7 % — ABNORMAL LOW (ref 34.8–46.6)
HGB: 11.7 g/dL (ref 11.6–15.9)
LYMPH%: 20.6 % (ref 14.0–48.0)
MCH: 28.5 pg (ref 26.0–34.0)
MCV: 84.6 fL (ref 81.0–101.0)
NEUT%: 69.8 % (ref 39.6–76.8)
Platelets: 310 10*3/uL (ref 145–400)
lymph#: 0.8 10*3/uL — ABNORMAL LOW (ref 0.9–3.3)

## 2008-03-30 LAB — COMPREHENSIVE METABOLIC PANEL
AST: 18 U/L (ref 0–37)
BUN: 14 mg/dL (ref 6–23)
Calcium: 8.4 mg/dL (ref 8.4–10.5)
Chloride: 102 mEq/L (ref 96–112)
Creatinine, Ser: 0.66 mg/dL (ref 0.40–1.20)
Total Bilirubin: 0.5 mg/dL (ref 0.3–1.2)

## 2008-04-04 ENCOUNTER — Emergency Department (HOSPITAL_COMMUNITY): Admission: EM | Admit: 2008-04-04 | Discharge: 2008-04-04 | Payer: Self-pay | Admitting: Family Medicine

## 2008-04-05 ENCOUNTER — Ambulatory Visit: Payer: Self-pay | Admitting: Family Medicine

## 2008-04-05 DIAGNOSIS — R1012 Left upper quadrant pain: Secondary | ICD-10-CM | POA: Insufficient documentation

## 2008-04-06 LAB — COMPREHENSIVE METABOLIC PANEL
BUN: 16 mg/dL (ref 6–23)
CO2: 26 mEq/L (ref 19–32)
Calcium: 8 mg/dL — ABNORMAL LOW (ref 8.4–10.5)
Chloride: 103 mEq/L (ref 96–112)
Creatinine, Ser: 0.6 mg/dL (ref 0.40–1.20)

## 2008-04-06 LAB — CBC WITH DIFFERENTIAL/PLATELET
Basophils Absolute: 0 10*3/uL (ref 0.0–0.1)
HCT: 31.8 % — ABNORMAL LOW (ref 34.8–46.6)
HGB: 10.6 g/dL — ABNORMAL LOW (ref 11.6–15.9)
MCH: 28.4 pg (ref 26.0–34.0)
MONO#: 0 10*3/uL — ABNORMAL LOW (ref 0.1–0.9)
NEUT%: 69.7 % (ref 39.6–76.8)
WBC: 2.3 10*3/uL — ABNORMAL LOW (ref 3.9–10.0)
lymph#: 0.5 10*3/uL — ABNORMAL LOW (ref 0.9–3.3)

## 2008-04-12 LAB — CONVERTED CEMR LAB
ALT: 20 units/L (ref 0–35)
AST: 33 units/L (ref 0–37)
Albumin: 3 g/dL — ABNORMAL LOW (ref 3.5–5.2)
Alkaline Phosphatase: 64 units/L (ref 39–117)
BUN: 13 mg/dL (ref 6–23)
CO2: 31 meq/L (ref 19–32)
Chloride: 105 meq/L (ref 96–112)
Eosinophils Relative: 6.8 % — ABNORMAL HIGH (ref 0.0–5.0)
GFR calc non Af Amer: 96 mL/min
Glucose, Bld: 97 mg/dL (ref 70–99)
Lymphocytes Relative: 28.5 % (ref 12.0–46.0)
Monocytes Relative: 2.3 % — ABNORMAL LOW (ref 3.0–12.0)
Neutrophils Relative %: 62.2 % (ref 43.0–77.0)
Platelets: 213 10*3/uL (ref 150–400)
Potassium: 3.6 meq/L (ref 3.5–5.1)
RBC: 3.86 M/uL — ABNORMAL LOW (ref 3.87–5.11)
Total Protein: 6.8 g/dL (ref 6.0–8.3)
WBC: 2.7 10*3/uL — ABNORMAL LOW (ref 4.5–10.5)

## 2008-04-13 LAB — CBC WITH DIFFERENTIAL/PLATELET
BASO%: 0.8 % (ref 0.0–2.0)
HCT: 29.8 % — ABNORMAL LOW (ref 34.8–46.6)
LYMPH%: 32.2 % (ref 14.0–48.0)
MCH: 28.4 pg (ref 26.0–34.0)
MCHC: 34 g/dL (ref 32.0–36.0)
MCV: 83.6 fL (ref 81.0–101.0)
MONO#: 0 10*3/uL — ABNORMAL LOW (ref 0.1–0.9)
MONO%: 1.7 % (ref 0.0–13.0)
NEUT%: 58.8 % (ref 39.6–76.8)
Platelets: 202 10*3/uL (ref 145–400)
RBC: 3.56 10*6/uL — ABNORMAL LOW (ref 3.70–5.32)
WBC: 1.8 10*3/uL — ABNORMAL LOW (ref 3.9–10.0)

## 2008-04-13 LAB — COMPREHENSIVE METABOLIC PANEL
ALT: 12 U/L (ref 0–35)
Alkaline Phosphatase: 87 U/L (ref 39–117)
CO2: 27 mEq/L (ref 19–32)
Creatinine, Ser: 0.56 mg/dL (ref 0.40–1.20)
Sodium: 140 mEq/L (ref 135–145)
Total Bilirubin: 0.5 mg/dL (ref 0.3–1.2)

## 2008-04-14 ENCOUNTER — Ambulatory Visit: Payer: Self-pay | Admitting: Family Medicine

## 2008-04-16 ENCOUNTER — Telehealth (INDEPENDENT_AMBULATORY_CARE_PROVIDER_SITE_OTHER): Payer: Self-pay | Admitting: *Deleted

## 2008-04-20 LAB — CBC WITH DIFFERENTIAL/PLATELET
Eosinophils Absolute: 0.1 10*3/uL (ref 0.0–0.5)
HCT: 30.1 % — ABNORMAL LOW (ref 34.8–46.6)
LYMPH%: 57.2 % — ABNORMAL HIGH (ref 14.0–48.0)
MCHC: 33.4 g/dL (ref 32.0–36.0)
MCV: 84.4 fL (ref 81.0–101.0)
MONO#: 0.3 10*3/uL (ref 0.1–0.9)
MONO%: 11.7 % (ref 0.0–13.0)
NEUT#: 0.7 10*3/uL — ABNORMAL LOW (ref 1.5–6.5)
NEUT%: 26.6 % — ABNORMAL LOW (ref 39.6–76.8)
Platelets: 323 10*3/uL (ref 145–400)
WBC: 2.7 10*3/uL — ABNORMAL LOW (ref 3.9–10.0)

## 2008-04-20 LAB — COMPREHENSIVE METABOLIC PANEL
BUN: 11 mg/dL (ref 6–23)
CO2: 28 mEq/L (ref 19–32)
Creatinine, Ser: 0.65 mg/dL (ref 0.40–1.20)
Glucose, Bld: 79 mg/dL (ref 70–99)
Total Bilirubin: 0.3 mg/dL (ref 0.3–1.2)

## 2008-04-27 LAB — COMPREHENSIVE METABOLIC PANEL
BUN: 9 mg/dL (ref 6–23)
CO2: 24 mEq/L (ref 19–32)
Glucose, Bld: 109 mg/dL — ABNORMAL HIGH (ref 70–99)
Sodium: 141 mEq/L (ref 135–145)
Total Bilirubin: 0.3 mg/dL (ref 0.3–1.2)
Total Protein: 6.5 g/dL (ref 6.0–8.3)

## 2008-04-27 LAB — CBC WITH DIFFERENTIAL/PLATELET
Basophils Absolute: 0 10*3/uL (ref 0.0–0.1)
Eosinophils Absolute: 0 10*3/uL (ref 0.0–0.5)
HCT: 32.5 % — ABNORMAL LOW (ref 34.8–46.6)
HGB: 10.8 g/dL — ABNORMAL LOW (ref 11.6–15.9)
LYMPH%: 34.5 % (ref 14.0–48.0)
MONO#: 0.6 10*3/uL (ref 0.1–0.9)
NEUT#: 2.1 10*3/uL (ref 1.5–6.5)
NEUT%: 49 % (ref 39.6–76.8)
Platelets: 541 10*3/uL — ABNORMAL HIGH (ref 145–400)
RBC: 3.87 10*6/uL (ref 3.70–5.32)
WBC: 4.3 10*3/uL (ref 3.9–10.0)

## 2008-05-04 LAB — CBC WITH DIFFERENTIAL/PLATELET
Eosinophils Absolute: 0.1 10*3/uL (ref 0.0–0.5)
LYMPH%: 27.2 % (ref 14.0–48.0)
MONO#: 0.7 10*3/uL (ref 0.1–0.9)
NEUT#: 3.3 10*3/uL (ref 1.5–6.5)
Platelets: 392 10*3/uL (ref 145–400)
RBC: 4.01 10*6/uL (ref 3.70–5.32)
RDW: 22 % — ABNORMAL HIGH (ref 11.3–14.5)
WBC: 5.7 10*3/uL (ref 3.9–10.0)

## 2008-05-04 LAB — COMPREHENSIVE METABOLIC PANEL
Albumin: 3.6 g/dL (ref 3.5–5.2)
CO2: 26 mEq/L (ref 19–32)
Calcium: 8.9 mg/dL (ref 8.4–10.5)
Glucose, Bld: 98 mg/dL (ref 70–99)
Potassium: 4.2 mEq/L (ref 3.5–5.3)
Sodium: 138 mEq/L (ref 135–145)
Total Protein: 6.6 g/dL (ref 6.0–8.3)

## 2008-05-11 LAB — CBC WITH DIFFERENTIAL/PLATELET
BASO%: 0.2 % (ref 0.0–2.0)
Basophils Absolute: 0 10*3/uL (ref 0.0–0.1)
EOS%: 2.4 % (ref 0.0–7.0)
Eosinophils Absolute: 0.1 10*3/uL (ref 0.0–0.5)
HCT: 32 % — ABNORMAL LOW (ref 34.8–46.6)
HGB: 10.9 g/dL — ABNORMAL LOW (ref 11.6–15.9)
LYMPH%: 31 % (ref 14.0–48.0)
MCH: 28.4 pg (ref 26.0–34.0)
MCHC: 33.9 g/dL (ref 32.0–36.0)
MCV: 83.6 fL (ref 81.0–101.0)
MONO#: 0.2 10*3/uL (ref 0.1–0.9)
MONO%: 3.5 % (ref 0.0–13.0)
NEUT#: 2.7 10*3/uL (ref 1.5–6.5)
NEUT%: 62.9 % (ref 39.6–76.8)
Platelets: 336 10*3/uL (ref 145–400)
RBC: 3.83 10*6/uL (ref 3.70–5.32)
RDW: 21.2 % — ABNORMAL HIGH (ref 11.3–14.5)
WBC: 4.3 10*3/uL (ref 3.9–10.0)
lymph#: 1.3 10*3/uL (ref 0.9–3.3)

## 2008-05-11 LAB — COMPREHENSIVE METABOLIC PANEL
ALT: 20 U/L (ref 0–35)
AST: 16 U/L (ref 0–37)
Albumin: 3.7 g/dL (ref 3.5–5.2)
Alkaline Phosphatase: 91 U/L (ref 39–117)
BUN: 13 mg/dL (ref 6–23)
CO2: 27 mEq/L (ref 19–32)
Calcium: 9 mg/dL (ref 8.4–10.5)
Chloride: 102 mEq/L (ref 96–112)
Creatinine, Ser: 0.63 mg/dL (ref 0.40–1.20)
Glucose, Bld: 79 mg/dL (ref 70–99)
Potassium: 4.3 mEq/L (ref 3.5–5.3)
Sodium: 139 mEq/L (ref 135–145)
Total Bilirubin: 0.6 mg/dL (ref 0.3–1.2)
Total Protein: 7 g/dL (ref 6.0–8.3)

## 2008-05-18 ENCOUNTER — Encounter: Payer: Self-pay | Admitting: Family Medicine

## 2008-05-18 ENCOUNTER — Ambulatory Visit: Payer: Self-pay | Admitting: Internal Medicine

## 2008-05-18 LAB — CBC WITH DIFFERENTIAL/PLATELET
BASO%: 0.5 % (ref 0.0–2.0)
Eosinophils Absolute: 0.2 10*3/uL (ref 0.0–0.5)
HCT: 31.8 % — ABNORMAL LOW (ref 34.8–46.6)
LYMPH%: 30.7 % (ref 14.0–48.0)
MCHC: 34.1 g/dL (ref 32.0–36.0)
MCV: 83.3 fL (ref 81.0–101.0)
MONO%: 2.4 % (ref 0.0–13.0)
NEUT%: 61.6 % (ref 39.6–76.8)
Platelets: 366 10*3/uL (ref 145–400)
RBC: 3.82 10*6/uL (ref 3.70–5.32)

## 2008-05-18 LAB — COMPREHENSIVE METABOLIC PANEL
ALT: 13 U/L (ref 0–35)
Alkaline Phosphatase: 101 U/L (ref 39–117)
Creatinine, Ser: 0.64 mg/dL (ref 0.40–1.20)
Sodium: 138 mEq/L (ref 135–145)
Total Bilirubin: 0.4 mg/dL (ref 0.3–1.2)
Total Protein: 7 g/dL (ref 6.0–8.3)

## 2008-05-25 LAB — COMPREHENSIVE METABOLIC PANEL
ALT: 14 U/L (ref 0–35)
AST: 17 U/L (ref 0–37)
Alkaline Phosphatase: 86 U/L (ref 39–117)
CO2: 25 mEq/L (ref 19–32)
Creatinine, Ser: 0.58 mg/dL (ref 0.40–1.20)
Sodium: 138 mEq/L (ref 135–145)
Total Bilirubin: 0.5 mg/dL (ref 0.3–1.2)
Total Protein: 6.9 g/dL (ref 6.0–8.3)

## 2008-05-25 LAB — CBC WITH DIFFERENTIAL/PLATELET
BASO%: 0.2 % (ref 0.0–2.0)
EOS%: 5.1 % (ref 0.0–7.0)
HCT: 30.5 % — ABNORMAL LOW (ref 34.8–46.6)
LYMPH%: 30.1 % (ref 14.0–48.0)
MCH: 28.7 pg (ref 26.0–34.0)
MCHC: 33.8 g/dL (ref 32.0–36.0)
MONO#: 0.2 10*3/uL (ref 0.1–0.9)
MONO%: 5.4 % (ref 0.0–13.0)
NEUT%: 59.2 % (ref 39.6–76.8)
Platelets: 373 10*3/uL (ref 145–400)
RBC: 3.6 10*6/uL — ABNORMAL LOW (ref 3.70–5.32)
WBC: 3 10*3/uL — ABNORMAL LOW (ref 3.9–10.0)

## 2008-06-22 ENCOUNTER — Encounter: Payer: Self-pay | Admitting: Family Medicine

## 2008-06-22 LAB — CBC WITH DIFFERENTIAL/PLATELET
BASO%: 0.7 % (ref 0.0–2.0)
Basophils Absolute: 0 10*3/uL (ref 0.0–0.1)
HCT: 35.3 % (ref 34.8–46.6)
HGB: 11.7 g/dL (ref 11.6–15.9)
MONO#: 0.5 10*3/uL (ref 0.1–0.9)
NEUT%: 60 % (ref 38.4–76.8)
WBC: 6.5 10*3/uL (ref 3.9–10.3)
lymph#: 1.6 10*3/uL (ref 0.9–3.3)

## 2008-06-22 LAB — COMPREHENSIVE METABOLIC PANEL
ALT: 19 U/L (ref 0–35)
CO2: 24 mEq/L (ref 19–32)
Calcium: 8.8 mg/dL (ref 8.4–10.5)
Chloride: 103 mEq/L (ref 96–112)
Creatinine, Ser: 0.65 mg/dL (ref 0.40–1.20)

## 2008-12-21 ENCOUNTER — Ambulatory Visit: Payer: Self-pay | Admitting: Internal Medicine

## 2008-12-23 ENCOUNTER — Encounter: Payer: Self-pay | Admitting: Family Medicine

## 2008-12-23 LAB — CBC WITH DIFFERENTIAL/PLATELET
Eosinophils Absolute: 0.1 10*3/uL (ref 0.0–0.5)
HCT: 38.5 % (ref 34.8–46.6)
LYMPH%: 39.8 % (ref 14.0–49.7)
MCHC: 34.4 g/dL (ref 31.5–36.0)
MONO#: 0.4 10*3/uL (ref 0.1–0.9)
NEUT#: 2.6 10*3/uL (ref 1.5–6.5)
NEUT%: 50.3 % (ref 38.4–76.8)
Platelets: 324 10*3/uL (ref 145–400)
WBC: 5.1 10*3/uL (ref 3.9–10.3)

## 2008-12-23 LAB — LACTATE DEHYDROGENASE: LDH: 145 U/L (ref 94–250)

## 2008-12-23 LAB — COMPREHENSIVE METABOLIC PANEL
CO2: 27 mEq/L (ref 19–32)
Creatinine, Ser: 0.66 mg/dL (ref 0.40–1.20)
Glucose, Bld: 91 mg/dL (ref 70–99)
Total Bilirubin: 0.5 mg/dL (ref 0.3–1.2)

## 2009-01-17 ENCOUNTER — Ambulatory Visit: Payer: Self-pay | Admitting: Family Medicine

## 2009-04-28 ENCOUNTER — Telehealth: Payer: Self-pay | Admitting: Family Medicine

## 2009-05-02 ENCOUNTER — Ambulatory Visit: Payer: Self-pay | Admitting: Family Medicine

## 2009-05-02 ENCOUNTER — Telehealth: Payer: Self-pay | Admitting: Family Medicine

## 2009-05-20 ENCOUNTER — Encounter: Admission: RE | Admit: 2009-05-20 | Discharge: 2009-05-20 | Payer: Self-pay | Admitting: Gynecology

## 2009-06-20 ENCOUNTER — Ambulatory Visit: Payer: Self-pay | Admitting: Internal Medicine

## 2009-06-22 ENCOUNTER — Encounter: Payer: Self-pay | Admitting: Family Medicine

## 2009-06-22 LAB — CBC WITH DIFFERENTIAL/PLATELET
Eosinophils Absolute: 0.2 10*3/uL (ref 0.0–0.5)
MONO#: 0.3 10*3/uL (ref 0.1–0.9)
NEUT#: 3.4 10*3/uL (ref 1.5–6.5)
RBC: 5.01 10*6/uL (ref 3.70–5.45)
RDW: 15.5 % — ABNORMAL HIGH (ref 11.2–14.5)
WBC: 6.4 10*3/uL (ref 3.9–10.3)

## 2009-06-22 LAB — COMPREHENSIVE METABOLIC PANEL
ALT: 11 U/L (ref 0–35)
Albumin: 4 g/dL (ref 3.5–5.2)
Alkaline Phosphatase: 117 U/L (ref 39–117)
CO2: 25 mEq/L (ref 19–32)
Glucose, Bld: 118 mg/dL — ABNORMAL HIGH (ref 70–99)
Potassium: 4.5 mEq/L (ref 3.5–5.3)
Sodium: 137 mEq/L (ref 135–145)
Total Protein: 7.3 g/dL (ref 6.0–8.3)

## 2009-10-05 ENCOUNTER — Ambulatory Visit: Payer: Self-pay | Admitting: Family Medicine

## 2009-11-09 ENCOUNTER — Encounter (INDEPENDENT_AMBULATORY_CARE_PROVIDER_SITE_OTHER): Payer: Self-pay | Admitting: *Deleted

## 2009-12-05 ENCOUNTER — Ambulatory Visit: Payer: Self-pay | Admitting: Family Medicine

## 2009-12-07 ENCOUNTER — Telehealth: Payer: Self-pay | Admitting: Pulmonary Disease

## 2009-12-22 ENCOUNTER — Ambulatory Visit: Payer: Self-pay | Admitting: Pulmonary Disease

## 2009-12-22 DIAGNOSIS — G4733 Obstructive sleep apnea (adult) (pediatric): Secondary | ICD-10-CM | POA: Insufficient documentation

## 2009-12-23 ENCOUNTER — Ambulatory Visit: Payer: Self-pay | Admitting: Internal Medicine

## 2009-12-28 ENCOUNTER — Encounter: Payer: Self-pay | Admitting: Family Medicine

## 2009-12-28 LAB — COMPREHENSIVE METABOLIC PANEL
Albumin: 4 g/dL (ref 3.5–5.2)
Alkaline Phosphatase: 99 U/L (ref 39–117)
BUN: 17 mg/dL (ref 6–23)
Calcium: 8.7 mg/dL (ref 8.4–10.5)
Chloride: 103 mEq/L (ref 96–112)
Glucose, Bld: 95 mg/dL (ref 70–99)
Potassium: 4.2 mEq/L (ref 3.5–5.3)

## 2009-12-28 LAB — LACTATE DEHYDROGENASE: LDH: 142 U/L (ref 94–250)

## 2009-12-28 LAB — CBC WITH DIFFERENTIAL/PLATELET
Basophils Absolute: 0 10*3/uL (ref 0.0–0.1)
Eosinophils Absolute: 0.1 10*3/uL (ref 0.0–0.5)
HGB: 13.3 g/dL (ref 11.6–15.9)
MCV: 79.8 fL (ref 79.5–101.0)
MONO%: 6.1 % (ref 0.0–14.0)
NEUT#: 3.2 10*3/uL (ref 1.5–6.5)
RDW: 14.9 % — ABNORMAL HIGH (ref 11.2–14.5)

## 2010-01-17 ENCOUNTER — Ambulatory Visit (HOSPITAL_BASED_OUTPATIENT_CLINIC_OR_DEPARTMENT_OTHER): Admission: RE | Admit: 2010-01-17 | Discharge: 2010-01-17 | Payer: Self-pay | Admitting: Pulmonary Disease

## 2010-01-17 ENCOUNTER — Encounter: Payer: Self-pay | Admitting: Pulmonary Disease

## 2010-01-26 ENCOUNTER — Ambulatory Visit: Payer: Self-pay | Admitting: Pulmonary Disease

## 2010-01-31 ENCOUNTER — Telehealth (INDEPENDENT_AMBULATORY_CARE_PROVIDER_SITE_OTHER): Payer: Self-pay | Admitting: *Deleted

## 2010-02-24 ENCOUNTER — Ambulatory Visit: Payer: Self-pay | Admitting: Pulmonary Disease

## 2010-03-01 ENCOUNTER — Telehealth (INDEPENDENT_AMBULATORY_CARE_PROVIDER_SITE_OTHER): Payer: Self-pay | Admitting: *Deleted

## 2010-04-29 ENCOUNTER — Encounter: Payer: Self-pay | Admitting: Neurological Surgery

## 2010-04-30 ENCOUNTER — Encounter: Payer: Self-pay | Admitting: Gynecology

## 2010-05-07 LAB — CONVERTED CEMR LAB
Bilirubin Urine: NEGATIVE
Blood in Urine, dipstick: NEGATIVE
Glucose, Urine, Semiquant: NEGATIVE
Ketones, urine, test strip: NEGATIVE
Nitrite: NEGATIVE
Protein, U semiquant: 100
Specific Gravity, Urine: 1.015
Urobilinogen, UA: 0.2
WBC Urine, dipstick: NEGATIVE
pH: 6.5

## 2010-05-11 NOTE — Assessment & Plan Note (Signed)
Summary: ? PINK EYE, OK TO WORK IN PER DR Britnee Mcdevitt   Vital Signs:  Patient profile:   50 year old female Weight:      197.50 pounds Temp:     97.8 degrees F oral Pulse rate:   72 / minute Pulse rhythm:   regular BP sitting:   110 / 74  (left arm) Cuff size:   large  Vitals Entered By: Sydell Axon LPN (October 05, 2009 12:23 PM) CC: ? Pink eye, eyes red, irritated, watery and itch   History of Present Illness: Pt here with younger daughter for irritation of left eye. While at the swimming pool yesterday she began having acute ithching with the feeling of sand in her eye that has worsened. SHe had been wearing contacts at the time and took them out but sxs did not improve. Her eye was swollen this AM and matted shut. She has not had fever or chills, no headache, ST, cough and no known exposure. She feels well otherwise. She also complains of significantly disliking her psychiatrist who immediately labelled her as Bipolar and put her on Depakote and Seroquel which made her feel bad and which she has stopped. She had tolerated her past Celexa without difficulty, just felt she wasn't quite controlled.  Problems Prior to Update: 1)  Sinusitis - Acute-nos  (ICD-461.9) 2)  Abdominal Pain, Left Upper Quadrant  (ICD-789.02) 3)  Vaginitis  (ICD-616.10) 4)  Primary Exertional Headache (DR LEWITT)  (ICD-339.84) 5)  Depression, Recurrent  (ICD-311) 6)  Meningioma, Grade Iii Astrocytoma  (ICD-225.2) 7)  Sinusitis- Acute-nos  (ICD-461.9) 8)  Neutropenia, Drug Induced  (ICD-288.03)  Medications Prior to Update: 1)  Lexapro 20 Mg  Tabs (Escitalopram Oxalate) .... Take 1 Tablet By Mouth Once A Day 2)  Multivitamins   Tabs (Multiple Vitamin) .... Take 1 Tablet By Mouth Once A Day 3)  Proair Hfa 108 (90 Base) Mcg/act  Aers (Albuterol Sulfate) .... 2 Inh Q4h As Needed Shortness of Breath 4)  Ritalin 5 Mg Tabs (Methylphenidate Hcl) .... ? Dosage  Two Tablets By Mouth Every Morning and 1 Tablet By Mouth  After Lunch. 5)  Cheratussin Ac 100-10 Mg/23ml Syrp (Guaifenesin-Codeine) .Marland Kitchen.. 1 Tsp Every 6 Hours As Needed Cough. 6)  Clarithromycin 500 Mg Tabs (Clarithromycin) .... One Tab By Mouth Two Times A Day  Allergies: 1)  ! Penicillin 2)  ! Dilantin (Phenytoin Sodium Extended)  Physical Exam  General:  Well-developed,well-nourished,in no acute distress; alert,appropriate and cooperative throughout examination, looks tired but nontoxic. Head:  Normocephalic and atraumatic without obvious abnormalities. No apparent alopecia but chemo induced  balding. Sinuses mildly tender, max> frontals. Eyes:  Conjunctiva clear OD...inflamed and eryhtem, palpebral>conjunctival with mild discharge.  Ears:  External ear exam shows no significant lesions or deformities.  Otoscopic examination reveals clear canals, tympanic membranes are intact bilaterally without bulging, retraction, inflammation or discharge. Hearing is grossly normal bilaterally. YMs dull to LR. Nose:  no nasal discharge.  Mild congestion. Mouth:  Oral mucosa and oropharynx without lesions or exudates.  Teeth in good repair. Lungs:  Normal respiratory effort, chest expands symmetrically. Lungs are clear to auscultation, no crackles or wheezes. Heart:  Normal rate and regular rhythm. S1 and S2 normal without gallop, murmur, click, rub or other extra sounds.   Impression & Recommendations:  Problem # 1:  CONJUNCTIVITIS (ICD-372.30) Assessment New  Start antibiotic drops and discussed hygiene. Use drops minimum of one week. Her updated medication list for this problem includes:  Bleph-10 10 % Soln (Sulfacetamide sodium) .Marland Kitchen... 2 drops in affected eye three times a day  Discussed treatment, and urged patient to wash hands carefully after touching face.   Complete Medication List: 1)  Lexapro 20 Mg Tabs (Escitalopram oxalate) .... Take 1 tablet by mouth once a day 2)  Multivitamins Tabs (Multiple vitamin) .... Take 1 tablet by mouth once a  day 3)  Proair Hfa 108 (90 Base) Mcg/act Aers (Albuterol sulfate) .... 2 inh every 4h as needed shortness of breath 4)  Ritalin 5 Mg Tabs (Methylphenidate hcl) .... ? dosage  two tablets by mouth every morning and 1 tablet by mouth after lunch. 5)  Bleph-10 10 % Soln (Sulfacetamide sodium) .... 2 drops in affected eye three times a day  Patient Instructions: 1)  RTC if sxs don't resolve. Prescriptions: BLEPH-10 10 % SOLN (SULFACETAMIDE SODIUM) 2 drops in affected eye three times a day  #1 bottle x 0   Entered and Authorized by:   Shaune Leeks MD   Signed by:   Shaune Leeks MD on 10/05/2009   Method used:   Electronically to        [facility]* (retail)       [address]       [place], [place]  [address]       Ph: [phone]       Fax: [phone]   RxID:   [id number]   Current Allergies (reviewed today): ! PENICILLIN ! DILANTIN (PHENYTOIN SODIUM EXTENDED)

## 2010-05-11 NOTE — Progress Notes (Signed)
Summary: nos appt  Phone Note Call from Patient   Caller: [email or web address]  Call For: clance Summary of Call: Rsc nos from 8/30 to 9/15 @ 9:30a. Initial call taken by: Darletta Moll,  December 07, 2009 9:25 AM

## 2010-05-11 NOTE — Assessment & Plan Note (Signed)
Summary: rov to discuss sleep study results.   Visit Type:  Follow-up Copy to:  [name] MD Primary Provider/Referring Provider:  [name] MD  CC:  Audrey Turner here to discuss sleep study results. .  History of Present Illness: the Audrey Turner comes in today for f/u of her recent sleep study.  She was found to have moderate osa with AHI of 31/hr, and desat as low as 68% transiently.  I have reviewed the study with her in detail, and answered all of her questions.    Current Medications (verified): 1)  Lexapro 20 Mg  Tabs (Escitalopram Oxalate) .... Take 1 Tablet By Mouth Once A Day 2)  Multivitamins   Tabs (Multiple Vitamin) .... Take 1 Tablet By Mouth Once A Day 3)  Proair Hfa 108 (90 Base) Mcg/act  Aers (Albuterol Sulfate) .... 2 Inh Every 4h As Needed Shortness of Breath  Allergies (verified): 1)  ! Penicillin 2)  ! Dilantin (Phenytoin Sodium Extended)  Past History:  Past medical, surgical, family and social histories (including risk factors) reviewed, and no changes noted (except as noted below).  Past Medical History: Reviewed history from 12/05/2009 and no changes required. VAGINITIS (ICD-616.10) PRIMARY EXERTIONAL HEADACHE (DR LEWITT) (ICD-339.84) DEPRESSION, RECURRENT (ICD-311) MENINGIOMA, GRADE III ASTROCYTOMA (ICD-225.2) s/p chemo/radiation/surgery at [facility], followed by Dr. Arbutus Ped locally, in remission and on observation as of 11/2009 SINUSITIS- ACUTE-NOS (ICD-461.9) NEUTROPENIA, DRUG INDUCED (ICD-288.03) DIARRHEA (ICD-787.91)    Past Surgical History: Reviewed history from 08/13/2007 and no changes required. NSVD X 3 1-PREMIE AT 89 WKS  2-RSV,RENAL SURG IN FIRST 2 MOS  3-POSPPART DEPR D&C MISCARRIAGE 1993 MRI OF THE HEAD W  AND W/O CONTRAST //LEFT PARIETAL REGION MASS:(12/22/2002) LEFT FRONTAL CRANIOTOMY WITH LEFT SUBTOTAL RESECTION OF LEFT FRONTAL BRAIN TUMOR:(01/10/2006) [facility] BRAIN TUMOR SURGERY:(01/10/09/10/2005) RESECTION RESIDUAL TUMOR :(DR.  FRIEDMAN::[facility]):(10/17/2006)  Family History: Reviewed history from 12/22/2009 and no changes required. Father:A 64  COPD, sleep apnea Mother: A  65  GB removed CV: NEGATIVE HBP: +MGF DM: +PGM OVARIAN CANCER: PATERNAL AUNT DECEASED//MATERNAL AUNT STILL LIVING BREAST Cancer--Paternal Aunt:  BRAIN TUMOR: + PATIENT Brain cancer--Paternal Aunt  Social History: Reviewed history from 12/05/2009 and no changes required. 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    Vital Signs:  Patient profile:   50 year old female Height:      62 inches Weight:      204.50 pounds BMI:     37.54 O2 Sat:      99 % on Room air Temp:     97.8 degrees F oral Pulse rate:   77 / minute BP sitting:   130 / 82  (left arm) Cuff size:   large  Vitals Entered By: Carver Fila (February 24, 2010 10:13 AM)  O2 Flow:  Room air CC: Audrey Turner here to discuss sleep study results.  Comments meds and allergies updated Phone number updated Carver Fila  February 24, 2010 10:13 AM    Physical Exam  General:  obese female in nad Extremities:  no edema or cyanosis  Neurologic:  alert and oriented, moves all 4.   Impression & Recommendations:  Problem # 1:  OBSTRUCTIVE SLEEP APNEA (ICD-327.23)  the Audrey Turner has moderate osa by her recent sleep study, and I have recommended that she try cpap while  trying to work on weight loss.  I have also discussed with her surgery and dental appliance, but do not think these offer her reasonable chance of treatment success.  The Audrey Turner is hesitant to try cpap at this time, and would like to try weight loss alone first.  I have tried to convince her otherwise, and have at least gotten her to agree to f/u with me in 6mos to re-assess.  She is to call if she changes her mind.    Other Orders: Est. Patient Level III (40347)  Patient Instructions: 1)  work on weight loss over the next 6mos, but call if you decide to try cpap. 2)  followup with me in 6mos.

## 2010-05-11 NOTE — Assessment & Plan Note (Signed)
Summary: DISCUSS SLEEP APNEA/DR SCHALLER PT/   Vital Signs:  Patient profile:   50 year old female Height:      62 inches Weight:      202 pounds BMI:     37.08 Temp:     97.9 degrees F oral Pulse rate:   84 / minute Pulse rhythm:   regular BP sitting:   122 / 74  (left arm) Cuff size:   large  Vitals Entered By: Delilah Shan CMA  Dull) (December 05, 2009 11:04 AM) CC: Discuss sleep apnea (RNS pt)   History of Present Illness: Possible Sleep apnea- going for about 1 year.  Profound snoring, getting worse.  Occ napping during the day.  Waking tired and this is new for patient.  No witnessed apnea at night.  Dry mouth/throat noted in the AM.  Father with CPAP.  Back and side sleeper.  2 pillows.    Allergies: 1)  ! Penicillin 2)  ! Dilantin (Phenytoin Sodium Extended)  Past History:  Family History: Last updated: 12/05/2009 Father:A 64  COPD, sleep apnea Mother: A  65  GB removed BROTHER A BROTHER A BROTHER A CV: NEGATIVE HBP: +MGF DM: +PGM GOUT/ARTHRITIS: PROSTATE CANCER: OVARIAN CANCER: PATERNAL AUNT DECEASED//MATERNAL AUNT STILL LIVING BREAST/UTERINE CANCER: NEGATIVE BRAIN TUMOR: + PATIENT COLON CANCER: NEGATIVE DEPRESSION: +POSTPARTUM SELF ETOH/DRUG ABUSE: NEGATIVE  Past Medical History: VAGINITIS (ICD-616.10) PRIMARY EXERTIONAL HEADACHE (DR LEWITT) (ICD-339.84) DEPRESSION, RECURRENT (ICD-311) MENINGIOMA, GRADE III ASTROCYTOMA (ICD-225.2) s/p chemo/radiation/surgery at [facility], followed by Dr. Arbutus Ped locally, in remission and on observation as of 11/2009 SINUSITIS- ACUTE-NOS (ICD-461.9) NEUTROPENIA, DRUG INDUCED (ICD-288.03) DIARRHEA (ICD-787.91)    Family History: Father:A 19  COPD, sleep apnea Mother: A  87  GB removed BROTHER A BROTHER A BROTHER A CV: NEGATIVE HBP: +MGF DM: +PGM GOUT/ARTHRITIS: PROSTATE CANCER: OVARIAN CANCER: PATERNAL AUNT DECEASED//MATERNAL AUNT STILL LIVING BREAST/UTERINE CANCER: NEGATIVE BRAIN TUMOR: + PATIENT COLON CANCER:  NEGATIVE DEPRESSION: +POSTPARTUM SELF ETOH/DRUG ABUSE: NEGATIVE  Social History: Reviewed history from 08/13/2007 and no changes required. 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  Otherwise negative.    Physical Exam  General:  GEN: nad, alert and oriented, obese HEENT: mucous membranes moist NECK: supple w/o LA CV: rrr.  no murmur PULM: ctab, no inc wob EXT: no edema SKIN: no acute rash    Impression & Recommendations:  Problem # 1:  Sx of SLEEP APNEA (ICD-780.57) d/w patient re: sleep cycle and disruptions due to OSA.  given her hx, I would like pulm input on split vs full sleep study.  She understood.  I d/w patient ZO:XWRUEA loss and have handout on low carb foods.  Goal weight loss of 1 lb per month.  Pt to start back walking.   Orders: Pulmonary Referral (Pulmonary)  Complete Medication List: 1)  Lexapro 20 Mg Tabs (Escitalopram oxalate) .... Take 1 tablet by mouth once a day 2)  Multivitamins Tabs (Multiple vitamin) .... Take 1 tablet by mouth once a day 3)  Proair Hfa 108 (90 Base) Mcg/act Aers (Albuterol sulfate) .... 2 inh every 4h as needed shortness of breath 4)  Bleph-10 10 % Soln (Sulfacetamide sodium) .... 2 drops in affected eye three times a day  Patient Instructions: 1)  Start back walking and look at the diet handout.  I would work on losing 1 pound a month.  See Shirlee Limerick about your referral before your leave today.  Take care.  I was glad to see you today.   Current Allergies (reviewed today): ! PENICILLIN ! DILANTIN (PHENYTOIN SODIUM EXTENDED)

## 2010-05-11 NOTE — Progress Notes (Signed)
Summary: wants referral to psychiatrist  Phone Note Call from Patient Call back at Home Phone [phone]   Caller: Patient Call For: Shaune Leeks MD Summary of Call: Pt states her depression is not getting any better.  She doesnt want to do anything, even house work,  and she would like a referral to see a psychiatrist in [place].  Please advise. Initial call taken by: Lowella Petties CMA,  April 28, 2009 3:08 PM  Follow-up for Phone Call        Will refer. Follow-up by: Shaune Leeks MD,  April 28, 2009 3:56 PM  Additional Follow-up for Phone Call Additional follow up Details #1::        Called [facility] to schedule appt, they are in her network. Patient must call herself to schedule as they require a credit card to hold the appt. Patient will call herself to schedule this appt. Additional Follow-up by: Carlton Adam,  May 02, 2009 10:47 AM

## 2010-05-11 NOTE — Progress Notes (Signed)
Summary: pt scheduled to discuss sleep study  Phone Note Outgoing Call   Call placed by: Carver Fila,  January 31, 2010 10:18 AM Call placed to: Patient Summary of Call: lmomtcb x1. per kc wants  pt to schedule ov to discuss sleep study results.  Carver Fila  January 31, 2010 10:18 AM   Follow-up for Phone Call        pt called back and is coming in on 02/17/10 at 10:15 to discuss sleep study results Carver Fila  February 01, 2010 9:11 AM

## 2010-05-11 NOTE — Progress Notes (Signed)
Summary: FYI - LMTCB x1  Phone Note Call from Patient   Caller: Patient Call For: [facility] Summary of Call: PT CALLING WITH THE NAME OF OINTMENT ( MUUPIROCIN USP 2% Initial call taken by: Rickard Patience,  March 01, 2010 2:11 PM  Follow-up for Phone Call        [facility] TCB x1.  does this med need to be added to her med list?  did [facility] need the name for a specific reason?  i see no mention of it in the ov note. Boone Master CNA/MA  March 01, 2010 2:41 PM   pt just making [place] aware [facility] gave her the bactroban  cream to use could not remember the name of the med at last ov. Follow-up by: Philipp Deputy CMA,  March 01, 2010 3:58 PM

## 2010-05-11 NOTE — Letter (Signed)
[facility]   Imported By: Lanelle Bal 07/15/2009 07:58:37  _____________________________________________________________________  External Attachment:    Type:   Image     Comment:   External Document

## 2010-05-11 NOTE — Progress Notes (Signed)
Summary: Severe sorethroat and upper respiratory problems  Phone Note Call from Patient Call back at [phone]   Caller: Patient Call For: Shaune Leeks MD Summary of Call: Pt has severe sorethroat. Pt has used throat lozenge and gargled with warm salt water.  Pt was out of town last Friday when began to feel bad.  Pt has slight productive cough with green mucus. Pt is having some wheezing and slight shortness of breath on and off. No fever. Pt uses [facility] [phone] if needed. Please advise.  Initial call taken by: Lewanda Rife LPN,  May 02, 2009 10:55 AM  Follow-up for Phone Call        Should be seen. Follow-up by: Shaune Leeks MD,  May 02, 2009 1:39 PM  Additional Follow-up for Phone Call Additional follow up Details #1::        Pt notified as instructed by telephone. Pt is scheduled to see Dr Hetty Ely today at 4:15. Lewanda Rife LPN  May 02, 2009 2:37 PM

## 2010-05-11 NOTE — Letter (Signed)
Summary: [facility]  [facility]   Imported By: Maryln Gottron 01/06/2010 12:44:25  _____________________________________________________________________  External Attachment:    Type:   Image     Comment:   External Document  Appended Document: [facility]    Clinical Lists Changes

## 2010-05-11 NOTE — Assessment & Plan Note (Signed)
Summary: ST,COUGH,WHEEZING PER DR Berkley Wrightsman/[place]   Vital Signs:  Patient profile:   50 year old female Weight:      192 pounds BMI:     35.24 Temp:     98.1 degrees F oral Pulse rate:   88 / minute Pulse rhythm:   regular BP sitting:   112 / 78  (left arm) Cuff size:   large  Vitals Entered By: Sydell Axon LPN (May 02, 2009 4:11 PM)  History of Present Illness: Pt here for sxs since Fri. During day she and daughter went to visit her parents about 2 [place] of here. She continued worsening sxs throughout the day. She has signif ST, as bad today as Fri. Fri evening she started throat lozenges and Sat began Chest Congestion Relief, one every 4 hrs. She denies H/A. She has not had fever but some chills, some ear discomfort, some ST and mild rhinitis that is milky and cough up milky white to green mucous, no SOB except rarely, no N/V. She has some mild nausea. She has taken Mucous Reilef and took Nyquil last night. CBC nml when last seen by Oncology 9/10.  Problems Prior to Update: 1)  Uri  (ICD-465.9) 2)  Abdominal Pain, Left Upper Quadrant  (ICD-789.02) 3)  Urinary Urgency  (ZOX-096.04) 4)  Vaginitis  (ICD-616.10) 5)  Primary Exertional Headache (DR LEWITT)  (ICD-339.84) 6)  Depression, Recurrent  (ICD-311) 7)  Meningioma, Grade Iii Astrocytoma  (ICD-225.2) 8)  Sinusitis- Acute-nos  (ICD-461.9) 9)  Neutropenia, Drug Induced  (ICD-288.03) 10)  Diarrhea  (ICD-787.91)  Medications Prior to Update: 1)  Lexapro 20 Mg  Tabs (Escitalopram Oxalate) .... Take 1 Tablet By Mouth Once A Day 2)  Multivitamins   Tabs (Multiple Vitamin) .... Take 1 Tablet By Mouth Once A Day 3)  Proair Hfa 108 (90 Base) Mcg/act  Aers (Albuterol Sulfate) .... 2 Inh Q4h As Needed Shortness of Breath 4)  Ritalin 5 Mg Tabs (Methylphenidate Hcl) .... ? Dosage  Two Tablets By Mouth Every Morning and 1 Tablet By Mouth After Lunch. 5)  Cheratussin Ac 100-10 Mg/30ml Syrp (Guaifenesin-Codeine) .Marland Kitchen.. 1 Tsp Every 6 Hours As  Needed Cough.  Allergies: 1)  ! Penicillin 2)  ! Dilantin (Phenytoin Sodium Extended)  Physical Exam  General:  Well-developed,well-nourished,in no acute distress; alert,appropriate and cooperative throughout examination, looks tired but nontoxic. Head:  Normocephalic and atraumatic without obvious abnormalities. No apparent alopecia but chemo induced  balding. Sinuses mildly tender, max> frontals. Eyes:  Conjunctiva clear bilaterally.  Ears:  External ear exam shows no significant lesions or deformities.  Otoscopic examination reveals clear canals, tympanic membranes are intact bilaterally without bulging, retraction, inflammation or discharge. Hearing is grossly normal bilaterally. YMs dull to LR. Nose:  no nasal discharge.  Mild congestion. Mouth:  Oral mucosa and oropharynx without lesions or exudates.  Teeth in good repair. Lungs:  Normal respiratory effort, chest expands symmetrically. Lungs are clear to auscultation, no crackles or wheezes. Heart:  Normal rate and regular rhythm. S1 and S2 normal without gallop, murmur, click, rub or other extra sounds. Abdomen:  Bowel sounds positive,abdomen soft and non-tender without masses, organomegaly or hernias noted.   Impression & Recommendations:  Problem # 1:  SINUSITIS - ACUTE-NOS (ICD-461.9) Assessment New  With poss viral illness/influenza. Start Biaxin and cont Guaif. Gargle frequently and Delsym ok as needed. Sxs nogoing tooo long to use Tamiflu. Sxs relef as needed. Her updated medication list for this problem includes:    Cheratussin Ac  100-10 Mg/22ml Syrp (Guaifenesin-codeine) .Marland Kitchen... 1 tsp every 6 hours as needed cough.    Clarithromycin 500 Mg Tabs (Clarithromycin) ..... One tab by mouth two times a day  Instructed on treatment. Call if symptoms persist or worsen.   Complete Medication List: 1)  Lexapro 20 Mg Tabs (Escitalopram oxalate) .... Take 1 tablet by mouth once a day 2)  Multivitamins Tabs (Multiple vitamin) ....  Take 1 tablet by mouth once a day 3)  Proair Hfa 108 (90 Base) Mcg/act Aers (Albuterol sulfate) .... 2 inh q4h as needed shortness of breath 4)  Ritalin 5 Mg Tabs (Methylphenidate hcl) .... ? dosage  two tablets by mouth every morning and 1 tablet by mouth after lunch. 5)  Cheratussin Ac 100-10 Mg/52ml Syrp (Guaifenesin-codeine) .Marland Kitchen.. 1 tsp every 6 hours as needed cough. 6)  Clarithromycin 500 Mg Tabs (Clarithromycin) .... One tab by mouth two times a day  Patient Instructions: 1)  RTC if sxs don't improve. Prescriptions: CLARITHROMYCIN 500 MG TABS (CLARITHROMYCIN) one tab by mouth two times a day  #20 x 0   Entered and Authorized by:   Shaune Leeks MD   Signed by:   Shaune Leeks MD on 05/02/2009   Method used:   Electronically to        [facility]* (retail)       [address]       [place], [place]  [address]       Ph: [phone]       Fax: [phone]   RxID:   [id number]

## 2010-05-11 NOTE — Assessment & Plan Note (Signed)
Summary: consult for possible osa   Visit Type:  Initial Consult Copy to:  Audrey Givens MD Primary Provider/Referring Provider:  [name] MD  CC:  Sleep consult.Audrey Turner  History of Present Illness: The pt is a 50y/o female who comes in today for evaluation of possible osa.  She has been noted to have loud snoring, and also describes classic snoring and choking arousals during sleep.  No one has mentioned "pauses" in her breathing during sleep.  She typically goes to bed at 12mn, and arises at 6:45 am to start her day.  She has multiple awakenings during the night, and does not feel rested upon arising.  She will often come home and go back to bed after taking her kids to school.  She notes definite sleep pressure during the day with periods of inactivity, and can doze in the evenings while watching tv or movies in the evening.  She denies any sleepiness with driving.  Her weight is up 20 pounds over the last 2 years.  Her epworth score today is abnormal at 12.  Current Medications (verified): 1)  Lexapro 20 Mg  Tabs (Escitalopram Oxalate) .... Take 1 Tablet By Mouth Once A Day 2)  Multivitamins   Tabs (Multiple Vitamin) .... Take 1 Tablet By Mouth Once A Day 3)  Proair Hfa 108 (90 Base) Mcg/act  Aers (Albuterol Sulfate) .... 2 Inh Every 4h As Needed Shortness of Breath 4)  Bleph-10 10 % Soln (Sulfacetamide Sodium) .... 2 Drops in Affected Eye Three Times A Day  Allergies (verified): 1)  ! Penicillin 2)  ! Dilantin (Phenytoin Sodium Extended)  Past History:  Past Medical History: Reviewed history from 12/05/2009 and no changes required. VAGINITIS (ICD-616.10) PRIMARY EXERTIONAL HEADACHE (DR LEWITT) (ICD-339.84) DEPRESSION, RECURRENT (ICD-311) MENINGIOMA, GRADE III ASTROCYTOMA (ICD-225.2) s/p chemo/radiation/surgery at [facility], followed by Dr. Arbutus Ped locally, in remission and on observation as of 11/2009 SINUSITIS- ACUTE-NOS (ICD-461.9) NEUTROPENIA, DRUG INDUCED (ICD-288.03) DIARRHEA  (ICD-787.91)    Past Surgical History: Reviewed history from 08/13/2007 and no changes required. NSVD X 3 1-PREMIE AT 122 WKS  2-RSV,RENAL SURG IN FIRST 2 MOS  3-POSPPART DEPR D&C MISCARRIAGE 1993 MRI OF THE HEAD W  AND W/O CONTRAST //LEFT PARIETAL REGION MASS:(12/22/2002) LEFT FRONTAL CRANIOTOMY WITH LEFT SUBTOTAL RESECTION OF LEFT FRONTAL BRAIN TUMOR:(01/10/2006) [facility] BRAIN TUMOR SURGERY:(01/10/09/10/2005) RESECTION RESIDUAL TUMOR :(DR. FRIEDMAN::[facility]):(10/17/2006)  Family History: Reviewed history from 12/05/2009 and no changes required. Father:A 64  COPD, sleep apnea Mother: A  65  GB removed CV: NEGATIVE HBP: +MGF DM: +PGM OVARIAN CANCER: PATERNAL AUNT DECEASED//MATERNAL AUNT STILL LIVING BREAST Cancer--Paternal Aunt:  BRAIN TUMOR: + PATIENT Brain cancer--Paternal Aunt  Social History: Reviewed history from 12/05/2009 and no changes required. 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  The patient denies shortness of breath at rest, productive cough, non-productive cough, coughing up blood, chest pain, irregular heartbeats, loss of appetite, weight change, abdominal pain, difficulty swallowing, sore throat, tooth/dental problems, headaches, nasal congestion/difficulty breathing through nose, itching, ear ache, anxiety, depression, hand/feet swelling, rash, change in color of mucus, and fever.    Vital Signs:  Patient profile:   50 year old female Height:      62 inches Weight:      202 pounds BMI:  37.08 O2 Sat:      98 % on Room air Temp:     98.1 degrees F oral Pulse rate:   73 / minute BP sitting:   100 / 70  (right arm) Cuff size:   large  Vitals Entered By: Carver Fila (December 22, 2009 9:25 AM)  O2 Flow:   Room air CC: Sleep consult. Comments meds and allergies updated Phone number updated Carver Fila  December 22, 2009 9:25 AM    Physical Exam  General:  obese female in nad Eyes:  PERRLA and EOMI.   Nose:  deviated septum to left with narrowing. Mouth:  small oral cavity, elongation of soft palate and uvula, small posterior space Neck:  no jvd, tmg, LN Lungs:  clear to auscultation, no wheezing or rhonchi Heart:  rrr, no mrg Abdomen:  soft and nontender, bs+ Extremities:  no edema or cyanosis  pulses intact distally Neurologic:  alert, mild sleepy, oriented, moves all 4.   Impression & Recommendations:  Problem # 1:  OBSTRUCTIVE SLEEP APNEA (ICD-327.23) the pt's history is very suspicious for osa, and she is obese with abnormal upper airway anatomy.  I have had a long discussion with the pt about sleep apnea, including its impact on QOL and CV health.  I think she needs a sleep study for diagnosis, and will arrange for this at the sleep center.  She will f/u with me once results are available.    Other Orders: Consultation Level IV ([address]) Sleep Disorder Referral (Sleep Disorder)  Patient Instructions: 1)  will set up for sleep study, and arrange followup once results are available. 2)  work on weight loss    Appended Document: consult for possible osa needs ov to go over recent sleep study  Appended Document: consult for possible osa lmomtcb x1  Appended Document: consult for possible osa pt called back and is coming in on 02/17/10 at 10:15 to discuss these results.

## 2010-05-11 NOTE — Letter (Signed)
Summary: Nadara Eaton letter  [place] [facility]  [address] [place], [place] [address]   Phone: [phone]  Fax: [phone]       11/09/2009 MRN: [id number]  [facility] [address] [place], [place]  [address]  Dear Ms. [facility] [place] and [place] announce the retirement of Arta Silence, M.D., from full-time practice at the [facility] office effective October 06, 2009 and his plans of returning part-time.  It is important to Dr. Hetty Ely and to our practice that you understand that [facility] has seven physicians in our office for your health care needs.  We will continue to offer the same exceptional care that you have today.    Dr. Hetty Ely has spoken to many of you about his plans for retirement and returning part-time in the fall.   We will continue to work with you through the transition to schedule appointments for you in the office and meet the high standards that [facility] is committed to.   Again, it is with great pleasure that we share the news that Dr. Hetty Ely will return to [facility] in October of 2011 with a reduced schedule.    If you have any questions, or would like to request an appointment with one of our physicians, please call us at [phone] and press the option for Scheduling an appointment.  We take pleasure in providing you with excellent patient care and look forward to seeing you at your next office visit.  Our [facility] Physicians are:  Tillman Abide, M.D. Laurita Quint, M.D. Roxy Manns, M.D. Kerby Nora, M.D. Hannah Beat, M.D. Ruthe Mannan, M.D. We proudly welcomed Raechel Ache, M.D. and Eustaquio Boyden, M.D. to the practice in July/August 2011.  Sincerely,  [facility]

## 2010-06-15 ENCOUNTER — Other Ambulatory Visit: Payer: Self-pay | Admitting: Gynecology

## 2010-06-15 DIAGNOSIS — Z1231 Encounter for screening mammogram for malignant neoplasm of breast: Secondary | ICD-10-CM

## 2010-07-06 ENCOUNTER — Ambulatory Visit
Admission: RE | Admit: 2010-07-06 | Discharge: 2010-07-06 | Disposition: A | Discharge status: Home / Self Care | Payer: PRIVATE HEALTH INSURANCE | Source: Ambulatory Visit | Attending: Gynecology | Admitting: Gynecology

## 2010-07-06 DIAGNOSIS — Z1231 Encounter for screening mammogram for malignant neoplasm of breast: Secondary | ICD-10-CM

## 2010-07-12 ENCOUNTER — Other Ambulatory Visit: Payer: Self-pay | Admitting: Gynecology

## 2010-07-12 DIAGNOSIS — R928 Other abnormal and inconclusive findings on diagnostic imaging of breast: Secondary | ICD-10-CM

## 2010-07-20 ENCOUNTER — Other Ambulatory Visit: Payer: PRIVATE HEALTH INSURANCE

## 2010-07-25 ENCOUNTER — Ambulatory Visit
Admission: RE | Admit: 2010-07-25 | Discharge: 2010-07-25 | Disposition: A | Discharge status: Home / Self Care | Payer: PRIVATE HEALTH INSURANCE | Source: Ambulatory Visit | Attending: Gynecology | Admitting: Gynecology

## 2010-07-25 DIAGNOSIS — R928 Other abnormal and inconclusive findings on diagnostic imaging of breast: Secondary | ICD-10-CM

## 2010-08-22 ENCOUNTER — Encounter: Payer: Self-pay | Admitting: Pulmonary Disease

## 2010-08-23 ENCOUNTER — Ambulatory Visit: Payer: Self-pay | Admitting: Pulmonary Disease

## 2010-08-25 NOTE — Op Note (Signed)
NAMESHERRI, MCARTHY           ACCOUNT NO.:  [id number]   MEDICAL RECORD NO.:  [id number]          PATIENT TYPE:  INP   LOCATION:  3172                         FACILITY:  [facility]   PHYSICIAN:  Stefani Dama, M.D.  DATE OF BIRTH:  19-Jul-1960   DATE OF PROCEDURE:  01/10/2006  DATE OF DISCHARGE:                                 OPERATIVE REPORT   PREOPERATIVE DIAGNOSIS:  Left frontal brain tumor.   POSTOPERATIVE DIAGNOSIS:  Left frontal brain tumor, frozen section diagnosis  is glioma, astrocytoma.   INDICATIONS:  Audrey Turner is a 50 year old individual who has had  significant headache with exercise.  It came on a few weeks ago.  Because of  repeated bouts of headache, she underwent an MRI scan which demonstrated  presence of a large left frontal lesion, a minimal amount of edema.  Lesion  measured about 6 x 8 x 5 cm and it was just anterior to the motor strip.  After careful consideration and discussion of her options, I advised  surgical resection and she is now taken to the operating room for this  procedure.   PROCEDURE:  The patient was brought to the operating room, placed on the  table in supine position.  After the smooth induction of general  endotracheal anesthesia, Foley catheter and arterial line was placed.  Previously a central venous catheter had been placed.  The patient was  placed in the three-point headrest then placed in the lounging supine  position.  The hair was combed to expose an area across the middle of the  frontal vertex from left to right and then a strip of the hair was shaved  measuring approximately an inch to inch in diameter from just anterior to  the left the ear over towards the right ear.  The scalp and this area was  then scrubbed with Betadine scrub, cleansed with alcohol.  The hair was  matted away with Surgilube and then the center strip was prepped with  DuraPrep.  After the DuraPrep dried adequately, towels were placed to towel  out this area and the skin was then infiltrated with 1 mL lidocaine with  1:100,000 epinephrine mixed 50/50 with 1/2% Marcaine for a total of 20 mL.  A linear incision was then created from the left to the right across the  vertex of the scalp and overlying the coronal suture nearly perfectly.  The  scalp was then flapped forward and flapped posteriorly and was retained with  several cerebellar retractors.  The lateral aspect of the bone on the left  side just above the temporalis fascia was then drilled away with a high-  speed bur and a round bit.  The dura was identified.  Then the craniotomy  attachment was used to raise a left frontal flap that was based on the  midline and extended approximately 3 cm in from the coronal suture and 1.5  cm behind the coronal suture.  The bone flap was elevated.  Hemostasis from  the dura was contained there was noted be a confluence of veins just at the  area of the coronal suture and  this seemed to correspond well with an area  where the tumor came to the surface of the brain on the coronal images. Then  once hemostasis was controlled, the tack-up sutures were placed around the  perimetry.  The dura was opened from the lateral aspect and flapped towards  the midline.  The vein was identified the region of the confluence and this  venous structure required cauterization and then division as it flowed early  into the dura.  This allowed for further dissection.  It was clear that on  the medial aspect right under this venous confluence the brain was  abnormally discolored.  It did not, however, distinguish itself  significantly from the surrounding brain.  However, once this area was  isolated and a corticectomy was performed, clearly tissue was abnormal as it  had a more rubbery consistency then the typical normal brain tissue.  With  this then, the specimens were sent for frozen section diagnosis and a cavity  was entered into the brain.  The tumor  itself was noted to have a very  rubbery consistency in this region.  After frozen section was sent off,  further resection was done in a very cautious fashion with careful visual  inspection being performed to see if we can identify any borders of the  tumor with the surrounding normal brain.  Because we were at the region of  the coronal suture, it was apparent that the motor strip was not far behind  [place] on the exposure and therefore posterior resection was performed very  carefully, being careful to stay clearly within the abnormal sections of  brain.  This portion of the resection was done with a ultrasonic aspirator  which eased removal of this rubbery tumor without much dissection of the  surrounding brain.  Anteriorly the dissection was then speeded up and large  sections of tumor were encountered here with some small cystic changes  within them.  There were also some small areas of hemorrhagic discoloration  within the tumor itself.  This was all sent for both gross specimen and up  the ultrasonic aspirator.  About this time while the resection was  continuing frozen section diagnosis was returned as glioma possibly medium  or possibly high-grade.  Definitive diagnosis could not be made on the basis  of the touch preps and there is noted be fair amount of frozen section  artifact per Dr. Esther Hardy.  Final diagnosis will be made on permanent  sections.  Specimens of the tumor were then sent off.  Further dissection  was continued into the frontal lobe along the medial aspect of the frontal  lobe, clearly resecting all the abnormal tissue that could be envisioned.  In the end the cavity was 8 cm long, 6 cm deep and approximately 4 cm wide.  This seemed to fit within the confines of the tumor bed that was identified  on the skin itself.  With this because of the proximity to the motor strip  posteriorly resection was halted and this area and the generous cavity that was created was  then carefully checked for hemostasis.  Once hemostasis was  achieved along all the pial borders with a combination of cautery and  tamponade, then the cavity was irrigated copiously until clear effluent was  retrieved.  The dura was then reflected into its original positioning,  closed with interrupted 4-0 Nurolon sutures.  The bone flap was then  replaced with the singular central tack-up with medium-sized  Osteomed plates  being used to around the perimetry.  Scalp was then reflected into its  original position and closed with 2-0 Vicryl in the galea and surgical  staples in the skin.  The patient's head was dressed with bacitracin  ointment and a dry, fluffy dressing with a wrap applied to the scalp.  The  patient tolerated procedure well.  Blood loss estimated at 150 mL.  She was  then returned to the ICU for further postoperative monitoring.      Stefani Dama, M.D.  Electronically Signed     HJE/MEDQ  D:  01/10/2006  T:  01/11/2006  Job:  [id number]

## 2010-08-25 NOTE — Letter (Signed)
December 07, 2005     Rene Kocher, MD  [address]  [place], [place] [address]   RE:  JANIJAH, SYMONS  MRN:  [id number]  /  DOB:  [birth year]   Dear Woody Seller:   This is to introduce [place], a 50 year old white female who has  developed headaches significant enough to be migraines after or during  exercise.  She basically has no exercised all summer long.  Her husband gave  her a gift of exercise with a trainer, which she has started and each time  she has tried to exercise, she has developed significant frontal headache  that has developed to be debilitating at times, bothering her vision,  causing her nausea.  She has not had headaches like this before, although  she has had typical headaches in the past.   Her medical problems include only recurrent depression, for which she is on  Prozac 40 mg a day and has been very stable on that since December 2004.  He  is mildly obese at 190 pounds and 59 inches.  Blood pressure was normal  today when seen, and she is on no medicines other than Prozac.  She is  allergic to penicillin.  I appreciate your seeing her and I am looking  forward to your evaluation.    Sincerely,     Arta Silence, MD   RNS/MedQ  DD:  12/07/2005  DT:  12/08/2005  Job #:  [id number]

## 2010-08-25 NOTE — Discharge Summary (Signed)
Audrey Turner, Audrey Turner           ACCOUNT NO.:  [id number]   MEDICAL RECORD NO.:  [id number]          PATIENT TYPE:  INP   LOCATION:  3015                         FACILITY:  [facility]   PHYSICIAN:  Coletta Memos, M.D.     DATE OF BIRTH:  02-Sep-1960   DATE OF ADMISSION:  01/10/2006  DATE OF DISCHARGE:  01/13/2006                                 DISCHARGE SUMMARY   ADMITTING DIAGNOSIS:  Left frontal brain tumor.   DISCHARGE DIAGNOSIS:  Left frontal brain tumor.   PROCEDURE:  Left frontal craniotomy for subtotal resection of left frontal  brain tumor.   COMPLICATIONS:  None.   CONDITION ON DISCHARGE:  Alive and well.   WOUND CARE:  Wound clean, dry without signs of infection.   DISCHARGE MEDICATIONS:  Dilantin, Decadron in a tapered dose, Vicodin for  pain.   FOLLOW UP:  She will have an appointment for staple removal.   DISCHARGE PHYSICAL EXAMINATION:  GENERAL:  She is alert, oriented x4,  answering all questions appropriately.  NEUROLOGIC:  Fund of knowledge normal.  No drift on exam.  She has a normal  gait.  She has a left eyelid droop.   HOSPITAL COURSE:  Audrey Turner was admitted to the hospital secondary to  significant headache that she was having with exercise.  She had an MRI scan  which showed a large, left, frontal lesion.  She was seen in the office and  it was recommended that she undergo craniotomy for relief of these symptoms.  She is admitted for this elective procedure and did well postoperatively.  She was discharged home.           ______________________________  Coletta Memos, M.D.     KC/MEDQ  D:  01/13/2006  T:  01/14/2006  Job:  [id number]

## 2010-08-25 NOTE — Discharge Summary (Signed)
[facility]  Patient:    Audrey Turner                     MRN: [id number] Adm. Date:  [id number] Disc. Date: [id number] Attending:  Merrily Pew Dictator:   Gwendolyn Fill. [place], P.A.                           Discharge Summary  DISCHARGE DIAGNOSES:          1. Intrauterine pregnancy at 38 weeks, delivered.                               2. Status post spontaneous vaginal delivery.                               3. Gestational diabetes, diet controlled.                               4. Advanced maternal age.  HISTORY OF PRESENT ILLNESS:   This is a 50 year old female, gravida 4, para 2, abortus 1, with an EDC of April 22, 1999.  Prenatal course had been complicated by suspected fetal macrosomia.  Also had a history of preterm labor with premature rupture of membranes and a history of PIH with pulmonary edema.  The patient did develop gestational diabetes this pregnancy which was diet controlled. Subsequently secondary to advanced maternal age, the patient did undergo genetic amniocentesis which revealed normal chromosomes.  HOSPITAL COURSE:              On April 11, 1999, the patient was admitted at 38 weeks in labor.  Subsequently on April 11, 1999, at 2354, the patient underwent a spontaneous vaginal delivery of a female, Apgars of 8 and 9, weight of 8 pounds 5 ounces.  There was a midline episiotomy.  There were no complications. Postpartum, the patient remained afebrile, voiding, and in stable condition.  She was discharged to home on April 13, 1999, and given [facility] Gynecology postpartum instructions as well as postpartum booklet.  ACCESSORY CLINICAL DATA:      The patient is B positive, rubella immune.  On April 12, 1999, hemoglobin was 11.7 and hematocrit 33.2.  DISPOSITION:                  The patient was discharged to home and informed to return to the office in six weeks and if she has any problems prior to  that time to be seen in the office.  She was given a prescription for Percocet p.r.n. pain and was to check her blood sugars beginning four days prior to postpartum visit. DD:  05/16/99 TD:  05/16/99 Job: [id number]

## 2011-05-30 ENCOUNTER — Telehealth: Payer: Self-pay | Admitting: Family Medicine

## 2011-05-30 NOTE — Telephone Encounter (Signed)
Triage Record Num: [id number] Operator: Chevis Pretty Patient Name: Audrey Turner Call Date & Time: 05/30/2011 2:10:11PM Patient Phone: [phone] PCP: Tillman Abide Patient Gender: Female PCP Fax : [phone] Patient DOB: 1960-10-19 Practice Name: [facility] Reason for Call: Caller: Kayce/Patient; PCP: [name] Clelia Croft); CB#: [id number]; ; ; Call regarding Sinus Infection? Has Had Cold Symptoms X. 2. Weeks; states her congestion makes it difficult to sleep well. Afebrile. c/o headache intermittently. States coughing up greenish sputum. Per protocol, emergent symptoms denied; advised appt within 24 hours. Unable to sched appt within 24 hours, as appts unavailable in system per Epic. Appt sched 1430 05/31/11 with Dr. Alphonsus Sias. Protocol(s) Used: Upper Respiratory Infection (URI) Recommended Outcome per Protocol: See Provider within 24 hours Reason for Outcome: Productive cough with colored sputum (other than clear or white sputum) Symptoms worsen after 7 days or symptoms do not improve after 14 days of home care Care Advice: ~ Use a cool mist humidifier to moisten air. Be sure to clean according to manufacturer's instructions. ~ Rest until symptoms improve. ~ May inhale steam from hot shower or heated water. Be careful to avoid burns. Limit or avoid exposure to irritants and allergens (e.g. air pollution, smoke/smoking, chemicals, dust, pollen, pet dander, etc.) ~ Increase fluids to 8-12 eight oz (1.6 to 2.4 liters) glasses per day, half of them to be water. Soups, popsicles, fruit juices, non-caffeinated sodas (unless restricting sodium intake), jello, broths, decaf teas, etc. are all okay. Warm fluids can be soothing. ~ ~ Consider use of a saline nasal spray per package directions to help relieve nasal congestion. ~ If you can, stop smoking now and avoid all secondhand smoke. ~ Warm fluids may help, or try a mixture of honey and lemon juice in warm  tea. ~ HEALTH PROMOTION / MAINTENANCE ~ SYMPTOM / CONDITION MANAGEMENT ~ INFECTION CONTROL ~ CAUTIONS Coughing up mucus or phlegm helps to get rid of an infection. A productive cough should not be stopped. A cough medicine with guaifenesin (Robitussin, Mucinex) can help loosen the mucus. Cough medicine with dextromethorphan (DM) should be avoided. Drinking lots of fluids can help loosen the mucus too, especially warm fluids. ~ Go to the ED if new onset of stiff neck (unable to touch chin to chest), generalized headache, change in mental status, difficulty opening mouth, unable to swallow liquids or signs of dehydration. ~ Most adults need to drink 6-10 eight-ounce glasses (1.2-2.0 liters) of fluids per day unless previously told to limit fluid intake for other medical reasons. Limit fluids that contain caffeine, sugar or alcohol. Urine will be a very light yellow color when you drink enough fluids. ~ Analgesic/Antipyretic Advice - Acetaminophen: Consider acetaminophen as directed on label or by pharmacist/provider for pain or fever PRECAUTIONS: - Use if there is no history of liver disease, alcoholism, or intake of three or more alcohol drinks per day ~ 05/30/2011 2:23:52PM Page 1 of 2 CAN_[id number]_V2 [facility] Triage Call Report Patient Name: Audrey Turner continuation page/s - Only if approved by provider during pregnancy or when breastfeeding - During pregnancy, acetaminophen should not be taken more than 3 consecutive days without telling provider - Do not exceed recommended dose or frequency ~ Go to the ED if having chest pain with breathing or breathing is becoming more difficult. Call provider if has a fever over 101.5 F (38.6 C) that has not responded to home care measures, having shaking chills or any fever in someone immunocompromised/frail elderly. ~ Speak with your provider as soon  as possible if: - any temperature elevation in a frail elderly or immunocompromised  patient (such as diabetes, HIV/AIDS, renal disease, chemotherapy, organ transplant, or chronic steroid use). - pregnant and temperature elevation of 100.5 F (38C) or above. - fever does not respond despite 2 doses of fever reducing medication. - fever responds to home care but persists for 3 days or more. ~ Analgesic/Antipyretic Advice - NSAIDs: Consider aspirin, ibuprofen, naproxen or ketoprofen for pain or fever as directed on label or by pharmacist/provider. PRECAUTIONS: - If over 94 years of age, should not take longer than 1 week without consulting provider. EXCEPTIONS: - Should not be used if taking blood thinners or have bleeding problems. - Do not use if have history of sensitivity/allergy to any of these medications; or history of cardiovascular, ulcer, kidney, liver disease or diabetes unless approved by provider. - Do not exceed recommended dose or frequency. ~ Respiratory Hygiene: - Cover the nose/mouth tightly with a tissue when coughing or sneezing. - Use tissue 1 time and discard in the nearest waste receptacle. - Wash hands with soap and water or alcohol-based hand rub after coming into contact with respiratory secretions and contaminated objects/materials. - Alternatively when no tissue is available, cough into the bend of the elbow. - .Avoid touching your eyes, nose or mouth. ~ 05/30/2011 2:23:52PM Page 2 of 2 [id number]_TriageRpt_V2

## 2011-05-30 NOTE — Telephone Encounter (Signed)
Or see if patient can get on my schedule for tomorrow.  Either way is okay with me. Thanks.

## 2011-05-30 NOTE — Telephone Encounter (Signed)
Dr. Lianne Bushy schedule is completely full. Spoke to patient and was advised that she is okay with the appointment scheduled for her with Dr. Alphonsus Sias. Dr. Para March aware.

## 2011-05-31 ENCOUNTER — Ambulatory Visit (INDEPENDENT_AMBULATORY_CARE_PROVIDER_SITE_OTHER): Payer: Self-pay | Admitting: Internal Medicine

## 2011-05-31 ENCOUNTER — Encounter: Payer: Self-pay | Admitting: Internal Medicine

## 2011-05-31 VITALS — BP 128/80 | HR 90 | Temp 98.3°F | Wt 201.0 lb

## 2011-05-31 DIAGNOSIS — J019 Acute sinusitis, unspecified: Secondary | ICD-10-CM | POA: Insufficient documentation

## 2011-05-31 MED ORDER — AZITHROMYCIN 250 MG PO TABS: ORAL_TABLET | ORAL | Status: AC

## 2011-05-31 NOTE — Progress Notes (Signed)
  Subjective:    Patient ID: Audrey Turner, female    DOB: [birth year], 51 y.o.   MRN: [id number]  HPI Thinks she has sinus infection Head is all clogged up Nose is congested and has some drainage and PND Some dry cough No fever Frontal headache Some trouble breathing through nose---has to breathe through mouth  No sore throat No ear pain Started about 2 weeks ago---seems to have worsened lately  Using advil and tylenol --?some help  Current Outpatient Prescriptions on File Prior to Visit  Medication Sig Dispense Refill  . escitalopram (LEXAPRO) 20 MG tablet Take 20 mg by mouth daily.        . Multiple Vitamin (MULTIVITAMIN) capsule Take 1 capsule by mouth daily.          Allergies  Allergen Reactions  . Penicillins     REACTION: RASH  . Phenytoin     REACTION: for seizures, gave her a rash    Past Medical History  Diagnosis Date  . Vaginitis   . Primary exertional headache   . Recurrent depression   . Meningioma     grade 3 atrocytoma s/p chemo/radiation/surgery at [facility], followed by Dr. Arbutus Ped locally, in remission and on observation as of 11/2009  . Acute sinusitis, unspecified   . Drug induced neutropenia   . Diarrhea     Past Surgical History  Procedure Date  . Resection residual tumor 10/08/2006    Dr. Zachery Conch: Kateri Mc  . Nsvd x 3 - premie at 22 wks   . D&c miscarriage 1993  . Left frontal craniotomy w/ left subtotal resection of left frontal brain tumor 01/10/2006  . Mch brain tumor surgery 01/13/2006    Family History  Problem Relation Age of Onset  . COPD Father   . Sleep apnea Father   . Ovarian cancer Paternal Aunt   . Breast cancer Paternal Aunt   . Brain cancer Paternal Aunt     History   Social History  . Marital Status: Married    Spouse Name: N/A    Number of Children: 3  . Years of Education: N/A   Occupational History  . stay at home mother    Social History Main Topics  . Smoking status: Never Smoker   . Smokeless  tobacco: Never Used  . Alcohol Use: No  . Drug Use: No  . Sexually Active: Not on file   Other Topics Concern  . Not on file   Social History Narrative  . No narrative on file   Review of Systems Some diarrhea through this Did have one day with several vomiting spells--5-6 days ago     Objective:   Physical Exam  Constitutional: She appears well-developed and well-nourished. No distress.  HENT:  Mouth/Throat: Oropharynx is clear and moist. No oropharyngeal exudate.       TMs normal Mild frontal and maxillary tenderness Marked nasal inflammation on right with opaque mucus. Mild on left  Neck: Normal range of motion.  Pulmonary/Chest: Effort normal and breath sounds normal. No respiratory distress. She has no wheezes. She has no rales.  Lymphadenopathy:    She has no cervical adenopathy.          Assessment & Plan:

## 2011-05-31 NOTE — Assessment & Plan Note (Signed)
After 2 weeks, I suspect bacterial component Discussed continuing the analgesics Z-pak

## 2011-06-28 ENCOUNTER — Other Ambulatory Visit: Payer: Self-pay | Admitting: Gynecology

## 2011-06-28 DIAGNOSIS — Z1231 Encounter for screening mammogram for malignant neoplasm of breast: Secondary | ICD-10-CM

## 2011-07-27 ENCOUNTER — Ambulatory Visit
Admission: RE | Admit: 2011-07-27 | Discharge: 2011-07-27 | Disposition: A | Discharge status: Home / Self Care | Payer: Commercial Managed Care - PPO | Source: Ambulatory Visit | Attending: Gynecology | Admitting: Gynecology

## 2011-07-27 DIAGNOSIS — Z1231 Encounter for screening mammogram for malignant neoplasm of breast: Secondary | ICD-10-CM

## 2011-10-15 ENCOUNTER — Ambulatory Visit (INDEPENDENT_AMBULATORY_CARE_PROVIDER_SITE_OTHER): Payer: Commercial Managed Care - PPO | Admitting: Family Medicine

## 2011-10-15 ENCOUNTER — Encounter: Payer: Self-pay | Admitting: Family Medicine

## 2011-10-15 VITALS — BP 126/84 | HR 76 | Temp 98.0°F | Wt 201.5 lb

## 2011-10-15 DIAGNOSIS — R300 Dysuria: Secondary | ICD-10-CM

## 2011-10-15 DIAGNOSIS — N3946 Mixed incontinence: Secondary | ICD-10-CM

## 2011-10-15 LAB — POCT URINALYSIS DIPSTICK
Bilirubin, UA: NEGATIVE
Ketones, UA: NEGATIVE
Leukocytes, UA: NEGATIVE
pH, UA: 7.5

## 2011-10-15 MED ORDER — TOLTERODINE TARTRATE 1 MG PO TABS: 1.0000 mg | ORAL_TABLET | Freq: Two times a day (BID) | ORAL | Status: DC

## 2011-10-15 NOTE — Assessment & Plan Note (Signed)
Path phys d/w pt.  Likely mixed source.  Start ditropan with anticholinergic caution and do kegel exercises.  She agrees.  Call back prn.

## 2011-10-15 NOTE — Patient Instructions (Addendum)
Take the detrol twice a day (be careful with dry mouth) and use the kegel exercises.  Take care.

## 2011-10-15 NOTE — Progress Notes (Signed)
Urinary urgency and incontinence.  She can't stop her stream before she gets to the bathroom.  Going on for years.  Gradually worse in the last few years. Had to start wearing pads.  Not leaking stool.  No burning with urination.  She had an episiotomy with prev NVSD; was able to control bladder after the NSVDs.  Can leak with cough or sneeze.    Meds, vitals, and allergies reviewed.   ROS: See HPI.  Otherwise, noncontributory.  nad ncat rrr ctab abd benign Pelvic deferred  U/a noted.

## 2011-11-20 ENCOUNTER — Encounter: Payer: Self-pay | Admitting: Family Medicine

## 2011-11-20 DIAGNOSIS — C719 Malignant neoplasm of brain, unspecified: Secondary | ICD-10-CM | POA: Insufficient documentation

## 2012-01-25 ENCOUNTER — Ambulatory Visit (INDEPENDENT_AMBULATORY_CARE_PROVIDER_SITE_OTHER): Payer: Commercial Managed Care - PPO | Admitting: Family Medicine

## 2012-01-25 ENCOUNTER — Encounter: Payer: Self-pay | Admitting: Family Medicine

## 2012-01-25 VITALS — BP 158/98 | HR 89 | Temp 98.3°F | Wt 202.0 lb

## 2012-01-25 DIAGNOSIS — H109 Unspecified conjunctivitis: Secondary | ICD-10-CM

## 2012-01-25 MED ORDER — POLYMYXIN B-TRIMETHOPRIM 10000-0.1 UNIT/ML-% OP SOLN: 1.0000 [drp] | OPHTHALMIC | Status: DC

## 2012-01-25 NOTE — Patient Instructions (Addendum)
Use the eye drops and get a flu shot (either here or at the pharmacy) when this is resolved.   Take care.  Glad to see you.

## 2012-01-25 NOTE — Progress Notes (Signed)
Night before last the sx started.  L eye is red, no sx in R eye.  No known FB, trauma.  Yesterday her eye was watering.  Is now not wearing of contacts.  Used regular eyedrops in meantime. No change in vision.  Still with some tearing/thin discharge.  No others with similar.  Eye feels irritated. No rhinorrhea, Fevers, ST, etc.    Urinary incontinence is some better with combination of Kegels and med tx.   Meds, vitals, and allergies reviewed.   ROS: See HPI.  Otherwise, noncontributory.  nad Old scarring noted on scalp Tm, nasal and OP exam wnl PERRL, EOMI, limited fundus exam wnl x2 No FB seen on B eye exam L lids with minimal irritation L conjunctiva injection w/o abnormal stain uptake seen on wood's lamp

## 2012-01-26 DIAGNOSIS — H109 Unspecified conjunctivitis: Secondary | ICD-10-CM | POA: Insufficient documentation

## 2012-01-26 NOTE — Assessment & Plan Note (Signed)
No FB, no trauma.  Start polytrim and f/u prn.  Handwashing discussed.  She agrees.

## 2012-01-28 ENCOUNTER — Other Ambulatory Visit: Payer: Self-pay | Admitting: *Deleted

## 2012-01-28 NOTE — Telephone Encounter (Signed)
This medication was not originally on the patient's meds list.  It was added for refill purposes.  Lexapro is on patient's meds list.  Please advise.

## 2012-01-29 ENCOUNTER — Encounter: Payer: Self-pay | Admitting: Family Medicine

## 2012-01-29 MED ORDER — CITALOPRAM HYDROBROMIDE 40 MG PO TABS: 40.0000 mg | ORAL_TABLET | Freq: Every day | ORAL | Status: DC

## 2012-01-29 NOTE — Telephone Encounter (Signed)
Patient says that the Lexapro was changed to Citalopram.  She is not taking both medications, only the Citalopram.

## 2012-01-29 NOTE — Telephone Encounter (Signed)
Sent!

## 2012-01-29 NOTE — Telephone Encounter (Signed)
Call and clarify with the patient.  I thought she was still on 20 mg lexapro.  If so, this is a reasonable substitution.  Let me know.

## 2012-07-11 ENCOUNTER — Other Ambulatory Visit: Payer: Self-pay

## 2012-07-11 DIAGNOSIS — Z1231 Encounter for screening mammogram for malignant neoplasm of breast: Secondary | ICD-10-CM

## 2012-08-06 ENCOUNTER — Telehealth: Payer: Self-pay | Admitting: Family Medicine

## 2012-08-06 DIAGNOSIS — R32 Unspecified urinary incontinence: Secondary | ICD-10-CM

## 2012-08-06 NOTE — Telephone Encounter (Signed)
Pt seen today when she came in for a family member's appointment.  She still has some leaking, some better with detrol and kegel's but not resolved. We talked about referral. Order is in.

## 2012-08-20 ENCOUNTER — Ambulatory Visit
Admission: RE | Admit: 2012-08-20 | Discharge: 2012-08-20 | Disposition: A | Discharge status: Home / Self Care | Payer: Commercial Managed Care - PPO | Source: Ambulatory Visit

## 2012-08-20 DIAGNOSIS — Z1231 Encounter for screening mammogram for malignant neoplasm of breast: Secondary | ICD-10-CM

## 2012-11-25 ENCOUNTER — Telehealth: Payer: Self-pay | Admitting: Family Medicine

## 2012-11-25 NOTE — Telephone Encounter (Signed)
She called about bariatric surgery info and I gave her the [facility] bariatric info phone number.  She'll call about that.

## 2012-11-25 NOTE — Telephone Encounter (Signed)
Patient would like for you to give her a call.  She would not mention what it is about.

## 2013-02-18 ENCOUNTER — Telehealth: Payer: Self-pay | Admitting: Family Medicine

## 2013-02-18 NOTE — Telephone Encounter (Signed)
Patient is calling asking for a physical before the end of the year.  Your first available is in February.  She said she needs it for her insurance.  Can patient be seen sooner for a physical?

## 2013-02-18 NOTE — Telephone Encounter (Signed)
Yes, appointment. Please not back to back with another CPE and not on Monday/Friday.  Labs ahead of time.  Thanks.

## 2013-02-26 ENCOUNTER — Ambulatory Visit (INDEPENDENT_AMBULATORY_CARE_PROVIDER_SITE_OTHER): Payer: Commercial Managed Care - PPO | Admitting: Gynecology

## 2013-02-26 ENCOUNTER — Other Ambulatory Visit (HOSPITAL_COMMUNITY)
Admission: RE | Admit: 2013-02-26 | Discharge: 2013-02-26 | Disposition: A | Discharge status: Home / Self Care | Payer: Commercial Managed Care - PPO | Source: Ambulatory Visit | Attending: Gynecology | Admitting: Gynecology

## 2013-02-26 ENCOUNTER — Encounter: Payer: Self-pay | Admitting: Gynecology

## 2013-02-26 VITALS — BP 124/78 | Ht 59.0 in | Wt 205.0 lb

## 2013-02-26 DIAGNOSIS — Z01419 Encounter for gynecological examination (general) (routine) without abnormal findings: Secondary | ICD-10-CM

## 2013-02-26 DIAGNOSIS — N3281 Overactive bladder: Secondary | ICD-10-CM

## 2013-02-26 DIAGNOSIS — Z1151 Encounter for screening for human papillomavirus (HPV): Secondary | ICD-10-CM | POA: Insufficient documentation

## 2013-02-26 DIAGNOSIS — N318 Other neuromuscular dysfunction of bladder: Secondary | ICD-10-CM

## 2013-02-26 LAB — COMPREHENSIVE METABOLIC PANEL
AST: 34 U/L (ref 0–37)
Albumin: 4 g/dL (ref 3.5–5.2)
Alkaline Phosphatase: 86 U/L (ref 39–117)
CO2: 25 mEq/L (ref 19–32)
Chloride: 101 mEq/L (ref 96–112)
Total Bilirubin: 0.6 mg/dL (ref 0.3–1.2)
Total Protein: 7.3 g/dL (ref 6.0–8.3)

## 2013-02-26 LAB — CBC WITH DIFFERENTIAL/PLATELET
Eosinophils Absolute: 0.2 10*3/uL (ref 0.0–0.7)
Eosinophils Relative: 3 % (ref 0–5)
HCT: 42.9 % (ref 36.0–46.0)
Lymphocytes Relative: 40 % (ref 12–46)
Lymphs Abs: 3.6 10*3/uL (ref 0.7–4.0)
MCH: 27.3 pg (ref 26.0–34.0)
MCV: 79.2 fL (ref 78.0–100.0)
Monocytes Absolute: 0.5 10*3/uL (ref 0.1–1.0)
Monocytes Relative: 6 % (ref 3–12)
RBC: 5.42 MIL/uL — ABNORMAL HIGH (ref 3.87–5.11)
WBC: 8.8 10*3/uL (ref 4.0–10.5)

## 2013-02-26 LAB — LIPID PANEL
LDL Cholesterol: 42 mg/dL (ref 0–99)
VLDL: 30 mg/dL (ref 0–40)

## 2013-02-26 LAB — HEMOGLOBIN A1C: Mean Plasma Glucose: 131 mg/dL — ABNORMAL HIGH (ref <117)

## 2013-02-26 NOTE — Patient Instructions (Signed)
Schedule colonoscopy with [facility] at [phone] or [facility] at [phone] Followup one year for annual exam. Call me within the next month as far as how you're doing with the [facility]

## 2013-02-26 NOTE — Progress Notes (Signed)
This is well below one year if Audrey Turner Sep 14, 1960 [id number]        52 y.o.  [id number] for annual exam.  Several issues noted below.  Past medical history,surgical history, problem list, medications, allergies, family history and social history were all reviewed and documented in the EPIC chart.  ROS:  Performed and pertinent positives and negatives are included in the history, assessment and plan .  Exam: Kim assistant Filed Vitals:   02/26/13 1116  BP: 124/78  Height: 4\' 11"  (1.499 m)  Weight: 205 lb (92.987 kg)   General appearance  Normal Skin grossly normal Head/Neck normal with no cervical or supraclavicular adenopathy thyroid normal Lungs  clear Cardiac RR, without RMG Abdominal  soft, nontender, without masses, organomegaly or hernia Breasts  examined lying and sitting without masses, retractions, discharge or axillary adenopathy. Pelvic  Ext/BUS/vagina  normal with atrophic changes  Cervix  normal with atrophic changes  Uterus  grossly normal size, midline and mobile nontender   Adnexa Without gross masses or tenderness    Anus and perineum  normal   Rectovaginal  normal sphincter tone without palpated masses or tenderness.    Assessment/Plan:  52 y.o. [id number] female for annual exam.   1. Postmenopausal. Patient without significant hot flushes, night sweats, vaginal dryness. Is not sexually active. No bleeding. Will continue to monitor. Patient does report any bleeding. 2. Detrusor instability. Patient finding herself having to rush to use the bathroom. No overt incontinence. Was evaluated by urology several months ago. Had trial of several OAB medications and found Toviaz seen to work the best. I gave her samples of Tovias 4 mg times one month. She will call me at the end of this month in followup and will refill her or just her dosage accordingly. Did not seem to have significant issues as far as constipation or dry mouth with the trial before. No history of  glaucoma. Check baseline urinalysis today 3. Pap smear 2009. Pap/HPV done today. No history of abnormal Pap smears previously. 4. History of brain cancer. Followed every 6 months at [facility] with NED. 5. Mammography 08/2012. Continue with annual mammography. SBE monthly review. 6. Colonoscopy never. Recommended arranging this year and names and numbers were given. 7. DEXA never. We'll plan further into the menopause. 8. Health maintenance. See primary physician but does not think that she had routine blood work done recently. We'll check baseline CBC comprehensive metabolic panel lipid profile TSH hemoglobin A1c. Followup one year, sooner as needed.   Note: This document was prepared with digital dictation and possible smart phrase technology. Any transcriptional errors that result from this process are unintentional.   Dara Lords MD, 11:53 AM 02/26/2013

## 2013-02-26 NOTE — Addendum Note (Signed)
Addended by: Dayna Barker on: 02/26/2013 12:01 PM   Modules accepted: Orders

## 2013-02-27 LAB — TSH: TSH: 2.542 u[IU]/mL (ref 0.350–4.500)

## 2013-03-09 ENCOUNTER — Other Ambulatory Visit: Payer: Commercial Managed Care - PPO

## 2013-03-12 ENCOUNTER — Encounter: Payer: Self-pay | Admitting: Family Medicine

## 2013-03-12 ENCOUNTER — Ambulatory Visit (INDEPENDENT_AMBULATORY_CARE_PROVIDER_SITE_OTHER): Payer: Commercial Managed Care - PPO | Admitting: Family Medicine

## 2013-03-12 VITALS — BP 148/90 | HR 78 | Temp 97.8°F | Ht 58.25 in | Wt 205.5 lb

## 2013-03-12 DIAGNOSIS — C719 Malignant neoplasm of brain, unspecified: Secondary | ICD-10-CM

## 2013-03-12 DIAGNOSIS — Z1211 Encounter for screening for malignant neoplasm of colon: Secondary | ICD-10-CM

## 2013-03-12 DIAGNOSIS — R7309 Other abnormal glucose: Secondary | ICD-10-CM

## 2013-03-12 DIAGNOSIS — Z23 Encounter for immunization: Secondary | ICD-10-CM

## 2013-03-12 DIAGNOSIS — R739 Hyperglycemia, unspecified: Secondary | ICD-10-CM | POA: Insufficient documentation

## 2013-03-12 DIAGNOSIS — Z0000 Encounter for general adult medical examination without abnormal findings: Secondary | ICD-10-CM

## 2013-03-12 NOTE — Assessment & Plan Note (Signed)
Per [facility].  She has routine f/u for 04/2013.

## 2013-03-12 NOTE — Progress Notes (Signed)
Pre-visit discussion using our clinic review tool. No additional management support is needed unless otherwise documented below in the visit note.  CPE- See plan.  Routine anticipatory guidance given to patient.  See health maintenance. Saw Dr. Audie Box last month.  Pap/pelvic per gyn clinic. Mammogram done earlier in 2014.  Tetanus shot today Flu shot today.  D/w patient [id number] for colon cancer screening, including IFOB vs. colonoscopy.  Risks and benefits of both were discussed and patient voiced understanding.  Pt elects for: colonoscopy.   Living will d/w pt.  She would have her oldest brother [name] Ox[place]ine) designated if she were incapacitated.  She is in the process of getting divorced.  She is looking for a job. She is safe at home.   She has routine f/u scheduled with [facility] for 04/2013.  No new or different symptoms in the meantime. Stimulant rx'd by [facility].   Hyperglycemia d/w pt.  Labs and diet, weight d/w pt.  See plan.   PMH and SH reviewed  Meds, vitals, and allergies reviewed.   ROS: See HPI.  Otherwise negative.    GEN: nad, alert and oriented HEENT: mucous membranes moist NECK: supple w/o LA CV: rrr. PULM: ctab, no inc wob ABD: soft, +bs EXT: no edema SKIN: no acute rash

## 2013-03-12 NOTE — Assessment & Plan Note (Signed)
Routine anticipatory guidance given to patient.  See health maintenance. Saw Dr. Audie Box last month.  Pap/pelvic per gyn clinic. Mammogram done earlier in 2014.  Tetanus shot today Flu shot today.  D/w patient [id number] for colon cancer screening, including IFOB vs. colonoscopy.  Risks and benefits of both were discussed and patient voiced understanding.  Pt elects for: colonoscopy.   Living will d/w pt.  She would have her oldest brother Audrey Turner) designated if she were incapacitated.  She is in the process of getting divorced.  She is looking for a job. She is safe at home.

## 2013-03-12 NOTE — Assessment & Plan Note (Signed)
D/w pt about diet and weight, possible progression to DM2.  Handout given re: diet.  She understood.  Recheck in about 6 months.  Goal for gradual weight loss.  She agrees.

## 2013-03-12 NOTE — Patient Instructions (Addendum)
Shirlee Limerick will call about your referral. Ask the docs at [facility] about getting the shingles vaccine (see if they are okay with you taking the shot).   Try to cut out sugary foods- see handout.  Recheck sugar in about 6 months before a visit.

## 2013-03-31 ENCOUNTER — Encounter: Payer: Self-pay | Admitting: Internal Medicine

## 2013-04-23 ENCOUNTER — Telehealth: Payer: Self-pay

## 2013-04-23 MED ORDER — FESOTERODINE FUMARATE ER 4 MG PO TB24: 4.0000 mg | ORAL_TABLET | Freq: Every day | ORAL | Status: DC

## 2013-04-23 NOTE — Telephone Encounter (Signed)
Toviaz 4 milligram daily #30. Refill through November 2015

## 2013-04-23 NOTE — Telephone Encounter (Signed)
Per Dr. Loetta Rough " Okay to refill her Lisbeth Ply per my telephone note which I inadvertently closed "

## 2013-04-23 NOTE — Addendum Note (Signed)
Addended by: Ramond Craver on: 04/23/2013 12:27 PM   Modules accepted: Orders

## 2013-04-23 NOTE — Telephone Encounter (Signed)
At office visit in November 2014 you wrote: "Had trial of several OAB medications and found Toviaz seen to work the best. I gave her samples of Tovias 4 mg times one month. She will call me at the end of this month in followup and will refill her or just her dosage accordingly. Did not seem to have significant issues as far as constipation or dry mouth with the trial before. No history of glaucoma."  Patient is calling today to report that samples you gave her worked well and she needs Rx called in.

## 2013-04-28 ENCOUNTER — Ambulatory Visit: Payer: Commercial Managed Care - PPO | Admitting: Family Medicine

## 2013-04-29 ENCOUNTER — Ambulatory Visit (INDEPENDENT_AMBULATORY_CARE_PROVIDER_SITE_OTHER): Payer: Commercial Managed Care - PPO | Admitting: Family Medicine

## 2013-04-29 ENCOUNTER — Encounter: Payer: Self-pay | Admitting: Family Medicine

## 2013-04-29 VITALS — BP 130/78 | HR 76 | Temp 98.4°F | Wt 200.5 lb

## 2013-04-29 DIAGNOSIS — J019 Acute sinusitis, unspecified: Secondary | ICD-10-CM

## 2013-04-29 MED ORDER — BENZONATATE 200 MG PO CAPS: 200.0000 mg | ORAL_CAPSULE | Freq: Three times a day (TID) | ORAL | Status: DC | PRN

## 2013-04-29 MED ORDER — AZITHROMYCIN 250 MG PO TABS: ORAL_TABLET | ORAL | Status: DC

## 2013-04-29 NOTE — Patient Instructions (Signed)
Start the antibiotics today but skip the citalopram while on the zithromax.   Use the tessalon for cough.  Try to get some rest.  Drink plenty of fluids. Take care.  Glad to see you.

## 2013-04-29 NOTE — Assessment & Plan Note (Signed)
Would use zmax given PCN allergy, would hold SSRI for now.  D/w pt. Tessalon prn cough.  Nontoxic.  Fu prn.  She agrees.

## 2013-04-29 NOTE — Progress Notes (Signed)
Pre-visit discussion using our clinic review tool. No additional management support is needed unless otherwise documented below in the visit note.  Sx started last week.  Progressive in the meantime.  ST now, worse recently.  No fevers.  Congested.  Stuffy. Voice is altered.  No facial pain, but some frontal HA.  Some cough.  Some sputum.  Can get a deep breath, but occ triggers a cough.  No ear pain.  Some wheeze, occ noted, ie 2 nights ago.  No vomiting but some diarrhea.   Meds, vitals, and allergies reviewed.   ROS: See HPI.  Otherwise, noncontributory.  GEN: nad, alert and oriented HEENT: mucous membranes moist, tm w/o erythema, nasal exam w/o erythema, clear discharge noted,  OP with cobblestoning, L frontal sinus ttp > R frontal or max sinuses.  NECK: supple w/o LA CV: rrr.   PULM: ctab, no inc wob, cough noted.  EXT: no edema SKIN: no acute rash

## 2013-05-19 ENCOUNTER — Ambulatory Visit (AMBULATORY_SURGERY_CENTER): Payer: Commercial Managed Care - PPO

## 2013-05-19 VITALS — Ht 59.0 in | Wt 203.0 lb

## 2013-05-19 DIAGNOSIS — Z1211 Encounter for screening for malignant neoplasm of colon: Secondary | ICD-10-CM

## 2013-05-19 MED ORDER — NA SULFATE-K SULFATE-MG SULF 17.5-3.13-1.6 GM/177ML PO SOLN: ORAL | Status: DC

## 2013-05-22 ENCOUNTER — Encounter: Payer: Self-pay | Admitting: Internal Medicine

## 2013-06-02 ENCOUNTER — Encounter: Payer: Self-pay | Admitting: Internal Medicine

## 2013-06-02 ENCOUNTER — Ambulatory Visit (AMBULATORY_SURGERY_CENTER): Payer: Commercial Managed Care - PPO | Admitting: Internal Medicine

## 2013-06-02 VITALS — BP 151/84 | HR 82 | Temp 98.0°F | Resp 28 | Ht 62.0 in | Wt 204.0 lb

## 2013-06-02 DIAGNOSIS — Z1211 Encounter for screening for malignant neoplasm of colon: Secondary | ICD-10-CM

## 2013-06-02 MED ORDER — SODIUM CHLORIDE 0.9 % IV SOLN: 500.0000 mL | INTRAVENOUS | Status: DC

## 2013-06-02 NOTE — Op Note (Signed)
[place]  [address]. [place], [id number]   COLONOSCOPY PROCEDURE REPORT  PATIENT: Audrey Turner, Audrey Turner  MR#: [id number] BIRTHDATE: [birth year] , 52  yrs. old GENDER: Female ENDOSCOPIST: Gatha Mayer, MD, [facility] REFERRED [place]:[id number] Damita Dunnings, M.D. PROCEDURE DATE:  06/02/2013 PROCEDURE:   Colonoscopy, screening First Screening Colonoscopy - Avg.  risk and is 50 yrs.  old or older Yes.  Prior Negative Screening - Now for repeat screening. N/A  History of Adenoma - Now for follow-up colonoscopy & has been > or = to 3 yrs.  N/A  Polyps Removed Today? No.  Recommend repeat exam, <10 yrs? No. ASA CLASS:   Class III INDICATIONS:average risk screening and first colonoscopy. MEDICATIONS: propofol (Diprivan) 200mg  IV, MAC sedation, administered by CRNA, and These medications were titrated to patient response per physician's verbal order  DESCRIPTION OF PROCEDURE:   After the risks benefits and alternatives of the procedure were thoroughly explained, informed consent was obtained.  A digital rectal exam revealed no abnormalities of the rectum.   The [id number]  endoscope was introduced through the anus and advanced to the cecum, which was identified by both the appendix and ileocecal valve. No adverse events experienced.   The quality of the prep was excellent using Suprep  The instrument was then slowly withdrawn as the colon was fully examined.      COLON FINDINGS: A normal appearing cecum, ileocecal valve, and appendiceal orifice were identified.  The ascending, hepatic flexure, transverse, splenic flexure, descending, sigmoid colon and rectum appeared unremarkable.  No polyps or cancers were seen. Retroflexed views revealed no abnormalities. The time to cecum=3 minutes 20 seconds.  Withdrawal time=8 minutes 31 seconds.  The scope was withdrawn and the procedure completed. COMPLICATIONS: There were no complications.  ENDOSCOPIC IMPRESSION: Normal  colonoscopy - excellent prep - first colonoscopy  RECOMMENDATIONS: Repeat colonoscopy 10 years - 2025   eSigned:  Gatha Mayer, MD, [facility] 06/02/2013 10:30 AM   cc: The Patient and [name], MD

## 2013-06-02 NOTE — Patient Instructions (Addendum)
Your colonoscopy was normal! Prep was great!  Next routine colonoscopy in 10 years - 2025  I appreciate the opportunity to care for you. Gatha Mayer, MD, FACG  YOU HAD AN ENDOSCOPIC PROCEDURE TODAY AT [facility]: Refer to the procedure report that was given to you for any specific questions about what was found during the examination.  If the procedure report does not answer your questions, please call your gastroenterologist to clarify.  If you requested that your care partner not be given the details of your procedure findings, then the procedure report has been included in a sealed envelope for you to review at your convenience later.  YOU SHOULD EXPECT: Some feelings of bloating in the abdomen. Passage of more gas than usual.  Walking can help get rid of the air that was put into your GI tract during the procedure and reduce the bloating. If you had a lower endoscopy (such as a colonoscopy or flexible sigmoidoscopy) you may notice spotting of blood in your stool or on the toilet paper. If you underwent a bowel prep for your procedure, then you may not have a normal bowel movement for a few days.  DIET: Your first meal following the procedure should be a light meal and then it is ok to progress to your normal diet.  A half-sandwich or bowl of soup is an example of a good first meal.  Heavy or fried foods are harder to digest and may make you feel nauseous or bloated.  Likewise meals heavy in dairy and vegetables can cause extra gas to form and this can also increase the bloating.  Drink plenty of fluids but you should avoid alcoholic beverages for 24 hours.  ACTIVITY: Your care partner should take you home directly after the procedure.  You should plan to take it easy, moving slowly for the rest of the day.  You can resume normal activity the day after the procedure however you should NOT DRIVE or use heavy machinery for 24 hours (because of the sedation medicines used during  the test).    SYMPTOMS TO REPORT IMMEDIATELY: A gastroenterologist can be reached at any hour.  During normal business hours, 8:30 AM to 5:00 PM Monday through Friday, call [phone].  After hours and on weekends, please call the GI answering service at [phone] who will take a message and have the physician on call contact you.   Following lower endoscopy (colonoscopy or flexible sigmoidoscopy):  Excessive amounts of blood in the stool  Significant tenderness or worsening of abdominal pains  Swelling of the abdomen that is new, acute  Fever of 100F or higher  FOLLOW UP: If any biopsies were taken you will be contacted by phone or by letter within the next 1-3 weeks.  Call your gastroenterologist if you have not heard about the biopsies in 3 weeks.  Our staff will call the home number listed on your records the next business day following your procedure to check on you and address any questions or concerns that you may have at that time regarding the information given to you following your procedure. This is a courtesy call and so if there is no answer at the home number and we have not heard from you through the emergency physician on call, we will assume that you have returned to your regular daily activities without incident.  SIGNATURES/CONFIDENTIALITY: You and/or your care partner have signed paperwork which will be entered into your electronic medical record.  These signatures attest to the fact that that the information above on your After Visit Summary has been reviewed and is understood.  Full responsibility of the confidentiality of this discharge information lies with you and/or your care-partner.  Recommendations Repeat colonoscopy in 10 years-2025

## 2013-06-02 NOTE — Progress Notes (Signed)
No egg or soy allergy. ewm No problems with past sedation. ewm

## 2013-06-02 NOTE — Progress Notes (Signed)
Report to pacu rn, vss, bbs=clear 

## 2013-06-03 ENCOUNTER — Telehealth: Payer: Self-pay | Admitting: *Deleted

## 2013-06-03 NOTE — Telephone Encounter (Signed)
Message left

## 2013-06-18 ENCOUNTER — Other Ambulatory Visit: Payer: Self-pay | Admitting: Family Medicine

## 2013-06-18 NOTE — Telephone Encounter (Signed)
Electronic refill request. Rx last filled on 01/28/12 #30 with 12 RF.  Please advise.

## 2013-06-18 NOTE — Telephone Encounter (Signed)
Would continue.  Sent.  Thanks.  

## 2013-09-01 ENCOUNTER — Telehealth: Payer: Self-pay | Admitting: Family Medicine

## 2013-09-01 NOTE — Telephone Encounter (Signed)
Patient Information:  Caller Name: [place]  Phone: [phone]  Patient: Audrey Turner, Audrey Turner  Gender: Female  DOB: [birth year]  Age: 53 Years  PCP: [name] Brigitte Pulse) [facility])  Pregnant: No  Office Follow Up:  Does the office need to follow up with this patient?: No  Instructions For The Office: N/A  RN Note:  Patient calling with c/o left eye drainage and red. Requesting Rx be sent to [facility] px .  Per SO Rx Polytrim eye gtts - 2 drops both eyes QID x 5 days called to [phone].  Symptoms  Reason For Call & Symptoms: left eye red and drainage  Reviewed Health History In EMR: Yes  Reviewed Medications In EMR: Yes  Reviewed Allergies In EMR: Yes  Reviewed Surgeries / Procedures: Yes  Date of Onset of Symptoms: 08/31/2013 OB / GYN:  LMP: Unknown  Guideline(s) Used:  Eye - Pus or Discharge  Disposition Per Guideline:   Callback by PCP Today  Reason For Disposition Reached:   Eye with yellow/green discharge or eyelashes stick together, but NO standing order to call in antibiotic eye drops  Advice Given:  Expected Course:   With treatment, the yellow discharge should clear up in 3 days. The red eyes may persist for several more days.  Call Back If  Pus lasts over 3 days (72 hours) on treatment  Blurred vision develops  More than just mild discomfort  You become worse.  RN Overrode Recommendation:  Follow Up With Office Later  SO  Polytrim eye gtts - 2 drops both eyes QID x 5 days called to pharmacy

## 2013-09-02 NOTE — Telephone Encounter (Signed)
noted. To pcp.

## 2013-09-02 NOTE — Telephone Encounter (Signed)
F/u if not improved.  Thanks.

## 2013-09-02 NOTE — Telephone Encounter (Signed)
Message left notifying patient.

## 2013-09-07 ENCOUNTER — Other Ambulatory Visit: Payer: Commercial Managed Care - PPO

## 2013-09-10 ENCOUNTER — Ambulatory Visit: Payer: Commercial Managed Care - PPO | Admitting: Family Medicine

## 2013-09-10 DIAGNOSIS — Z0289 Encounter for other administrative examinations: Secondary | ICD-10-CM

## 2013-09-30 ENCOUNTER — Ambulatory Visit (INDEPENDENT_AMBULATORY_CARE_PROVIDER_SITE_OTHER): Payer: Commercial Managed Care - PPO | Admitting: Family Medicine

## 2013-09-30 ENCOUNTER — Encounter (INDEPENDENT_AMBULATORY_CARE_PROVIDER_SITE_OTHER): Payer: Self-pay

## 2013-09-30 ENCOUNTER — Ambulatory Visit (INDEPENDENT_AMBULATORY_CARE_PROVIDER_SITE_OTHER)
Admission: RE | Admit: 2013-09-30 | Discharge: 2013-09-30 | Disposition: A | Discharge status: Home / Self Care | Payer: Commercial Managed Care - PPO | Source: Ambulatory Visit | Attending: Family Medicine | Admitting: Family Medicine

## 2013-09-30 ENCOUNTER — Encounter: Payer: Self-pay | Admitting: Family Medicine

## 2013-09-30 VITALS — BP 110/70 | HR 80 | Temp 98.1°F | Ht 59.0 in | Wt 204.5 lb

## 2013-09-30 DIAGNOSIS — M25561 Pain in right knee: Secondary | ICD-10-CM

## 2013-09-30 DIAGNOSIS — M1710 Unilateral primary osteoarthritis, unspecified knee: Secondary | ICD-10-CM

## 2013-09-30 DIAGNOSIS — M1711 Unilateral primary osteoarthritis, right knee: Secondary | ICD-10-CM

## 2013-09-30 DIAGNOSIS — M25569 Pain in unspecified knee: Secondary | ICD-10-CM

## 2013-09-30 MED ORDER — DICLOFENAC SODIUM 75 MG PO TBEC: 75.0000 mg | DELAYED_RELEASE_TABLET | Freq: Two times a day (BID) | ORAL | Status: DC

## 2013-09-30 MED ORDER — TRAMADOL HCL 50 MG PO TABS: 50.0000 mg | ORAL_TABLET | Freq: Four times a day (QID) | ORAL | Status: DC | PRN

## 2013-09-30 NOTE — Progress Notes (Signed)
Pre visit review using our clinic review tool, if applicable. No additional management support is needed unless otherwise documented below in the visit note. 

## 2013-09-30 NOTE — Progress Notes (Signed)
Audrey Turner [id number] Phone: [phone] Fax: [phone]  Patient ID: Audrey Turner MRN: [id number], DOB: [birth year], 53 y.o. Date of Encounter: 09/30/2013  Primary Physician:  [name], MD   Chief Complaint: Knee Pain   Subjective:   History of Present Illness:  Audrey Turner is a 53 y.o. very pleasant female patient who presents with the following:  Right knee, hurting and does ot want to move. A few months. No known injury. Has fallen a couple of times i the last year. Has falen forward.  She has had some difficulty with her right knee, she has a history of astrocytoma. She also does have some difficulty with ambulation secondary to astrocytoma, she is currently in physical therapy. She is not having any significant effusion. She is not having any giving way. She is not in any prior operative intervention in the affected knee. It is been bothering her quite a bit more over the last 3-4 months, but there has not been any significant injury that she can recall.  Taken some tylenol and advil, does not help much.     Past Medical History, Surgical History, Social History, Family History, Problem List, Medications, and Allergies have been reviewed and updated if relevant.  Review of Systems:  GEN: No fevers, chills. Nontoxic. Primarily MSK c/o today. MSK: Detailed in the HPI GI: tolerating PO intake without difficulty Neuro: No numbness, parasthesias, or tingling associated. Otherwise the pertinent positives of the ROS are noted above.   Objective:   Physical Examination: BP 110/70  Pulse 80  Temp(Src) 98.1 F (36.7 C) (Oral)  Ht 4\' 11"  (1.499 m)  Wt 204 lb 8 oz (92.761 kg)  BMI 41.28 kg/m2   GEN: WDWN, NAD, Non-toxic, Alert & Oriented x 3 HEENT: Atraumatic, Normocephalic.  Ears and Nose: No external deformity. EXTR: No clubbing/cyanosis/edema NEURO: antalgic gait.  PSYCH: Normally interactive. Conversant. Not depressed or anxious  appearing.  Calm demeanor.    The right knee lacks 3 of extension and is able to flex to 95. She has some tenderness on the medial joint line greater than lateral joint line. Nontender the patellar tendon. Patella moves freely and without pain. Stable MCL, LCL, anterior cruciate ligament, and PCL. McMurray's causes some pain. Flexion pinch causes pain.  Radiology: Dg Knee Ap/lat W/sunrise Right  09/30/2013   CLINICAL DATA:  Knee pain  EXAM: DG KNEE - 3 VIEWS  COMPARISON:  None.  FINDINGS: There is no evidence of fracture, dislocation, or joint effusion. There is no evidence of arthropathy or other focal bone abnormality. Soft tissues are unremarkable. Joint spaces are maintained on weight-bearing images.  IMPRESSION: Negative.   Electronically Signed   By: Franchot Gallo M.D.   On: 09/30/2013 13:52    Assessment & Plan:   Knee pain, right  Primary osteoarthritis of right knee - Plan: DG Knee AP/LAT W/Sunrise Right  Examination is more suggestive of arthritis than radiographic films, and the patient is only able to bend her knee to about 90-95. Degenerative meniscal tear cannot be excluded and would be in the differential. We will treat as such with anti-inflammatories, mild pain medication, and an intra-articular injection.  Knee Injection, RIGHT Patient verbally consented to procedure. Risks (including potential rare risk of infection), benefits, and alternatives explained. Sterilely prepped with Chloraprep. Ethyl cholride used for anesthesia. 8 cc Lidocaine 1% mixed with Depo-Medrol 80 mg injected using the anteromedial approach without difficulty. No complications with procedure and tolerated well. Patient  had decreased pain post-injection.   New Prescriptions   DICLOFENAC (VOLTAREN) 75 MG EC TABLET    Take 1 tablet (75 mg total) by mouth 2 (two) times daily.   TRAMADOL (ULTRAM) 50 MG TABLET    Take 1 tablet (50 mg total) by mouth every 6 (six) hours as needed.   Modified Medications    No medications on file   Orders Placed This Encounter  Procedures  . DG Knee AP/LAT W/Sunrise Right   Follow-up: No Follow-up on file. Unless noted above, the patient is to follow-up if symptoms worsen. Red flags were reviewed with the patient.  Signed,  Maud Deed. Copland, MD, CAQ Sports Medicine   Discontinued Medications   BENZONATATE (TESSALON) 200 MG CAPSULE    Take 1 capsule (200 mg total) by mouth 3 (three) times daily as needed for cough.   FESOTERODINE (TOVIAZ) 4 MG TB24 TABLET    Take 1 tablet (4 mg total) by mouth daily.   Current Medications at Discharge:   Medication List       This list is accurate as of: 09/30/13 11:59 PM.  Always use your most recent med list.               citalopram 40 MG tablet  Commonly known as:  CELEXA  TAKE ONE (1) TABLET BY MOUTH EVERY DAY     diclofenac 75 MG EC tablet  Commonly known as:  VOLTAREN  Take 1 tablet (75 mg total) by mouth 2 (two) times daily.     methylphenidate 20 MG tablet  Commonly known as:  RITALIN  Take 20 mg by mouth 2 (two) times daily.     multivitamin capsule  Take 1 capsule by mouth daily.     traMADol 50 MG tablet  Commonly known as:  ULTRAM  Take 1 tablet (50 mg total) by mouth every 6 (six) hours as needed.

## 2014-02-08 ENCOUNTER — Encounter: Payer: Self-pay | Admitting: Family Medicine

## 2014-03-02 ENCOUNTER — Encounter: Payer: Commercial Managed Care - PPO | Admitting: Gynecology

## 2014-05-23 ENCOUNTER — Telehealth: Payer: Self-pay | Admitting: Family Medicine

## 2014-05-23 NOTE — Telephone Encounter (Signed)
Call pt.  Due for CPE.  Thanks.

## 2014-05-25 NOTE — Telephone Encounter (Signed)
Let message asking pt to call office  °

## 2014-06-03 NOTE — Telephone Encounter (Signed)
Called to schedule appointment pt stated she didn't want to schedule now.  She would call back to schedule

## 2014-09-01 ENCOUNTER — Ambulatory Visit: Payer: Commercial Managed Care - PPO | Admitting: Physical Therapy

## 2014-09-07 ENCOUNTER — Ambulatory Visit: Payer: Self-pay | Admitting: Physical Therapy

## 2014-09-22 ENCOUNTER — Ambulatory Visit: Payer: Self-pay | Admitting: Physical Therapy

## 2014-10-13 ENCOUNTER — Ambulatory Visit: Payer: Self-pay | Admitting: Physical Therapy

## 2014-10-14 ENCOUNTER — Ambulatory Visit: Payer: Self-pay | Attending: Nurse Practitioner | Admitting: Physical Therapy

## 2014-10-14 ENCOUNTER — Encounter: Payer: Self-pay | Admitting: Physical Therapy

## 2014-10-14 DIAGNOSIS — R2681 Unsteadiness on feet: Secondary | ICD-10-CM | POA: Insufficient documentation

## 2014-10-14 DIAGNOSIS — R269 Unspecified abnormalities of gait and mobility: Secondary | ICD-10-CM | POA: Insufficient documentation

## 2014-10-14 DIAGNOSIS — C711 Malignant neoplasm of frontal lobe: Secondary | ICD-10-CM | POA: Insufficient documentation

## 2014-10-14 NOTE — Therapy (Signed)
[place] MAIN [facility] SERVICES [address] [place], [place], [address] Phone: [phone]   Fax:  [phone]  Physical Therapy Evaluation  Patient Details  Name: Audrey Turner MRN: [id number] Date of Birth: January 06, 1961 Referring Provider:  Charolette Forward, C*  Encounter Date: 10/14/2014      PT End of Session - 10/14/14 1222    Visit Number 1   Number of Visits 8   Date for PT Re-Evaluation 12/09/14   PT Start Time 1115   PT Stop Time 1210   PT Time Calculation (min) 55 min   Equipment Utilized During Treatment Gait belt   Activity Tolerance Patient tolerated treatment well   Behavior During Therapy [facility] for tasks assessed/performed      Past Medical History  Diagnosis Date  . Vaginitis   . Primary exertional headache   . Recurrent depression   . Acute sinusitis, unspecified   . Drug induced neutropenia(288.03)   . Diarrhea   . Astrocytoma 01/2006 /09/2006    grade 3 atrocytoma s/p chemo/radiation/surgery at [place], in remission since at least 2011    Past Surgical History  Procedure Laterality Date  . Resection residual tumor  10/08/2006    Dr. Tommi Rumps: Rob Hickman  . Nsvd x 3 - premie at 22 wks    . D&c miscarriage  1993  . Left frontal craniotomy w/ left subtotal resection of left frontal brain tumor  01/10/2006  . Mch brain tumor surgery  01/13/2006  . Skull surg  2009/2010/2012    3 times past brain surg to close up opening    There were no vitals filed for this visit.  Visit Diagnosis:  Abnormality of gait  Unsteadiness      Subjective Assessment - 10/14/14 1124    Subjective Patient is pleasant 54 year old female with a history of brain tumor removal in 2007 at [facility], second brain tumor removal in 2008 at [place]. Patient reports that she is not walking well. Notes that she tries to walk faster than her "body can move" and has been having repeated falls over the past couple of months. Patient had outpatient  PT which ended June 2015. She reports increased loss of balance since last episode of care.     Pertinent History Brain tumor removal in 2007 and 2008.    Limitations Walking;Standing   How long can you sit comfortably? not a problem    How long can you stand comfortably? 5 minutes due to weakness in LE and decreased balance    How long can you walk comfortably? 5 minutes.    Patient Stated Goals increase walking speed and independence, improve balance, increase LE strength.             [facility] PT Assessment - 10/14/14 1134    Assessment   Medical Diagnosis Malignant neoplasm of frontal lobe   Onset Date/Surgical Date 10/13/05   Hand Dominance Right   Prior Therapy yes - March-june 2015 (good results)   Precautions   Precautions Fall   Balance Screen   Has the patient fallen in the past 6 months Yes   How many times? 3    Has the patient had a decrease in activity level because of a fear of falling?  No   Is the patient reluctant to leave their home because of a fear of falling?  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    Comments at time of D/C from outpatient PT in june of 2016 patient scored : 10seconds on 5xSTS; 51/56 Berg Balance Scale; .833 m/s on the 75mWT; 12 seconds on TUG; and 15/24 on the DGI   Cognition   Overall Cognitive Status Within Functional Limits for tasks assessed   Observation/Other Assessments   Activities of Balance Confidence Scale (ABC Scale)  46% confident ( high fall risk )     Posture/Postural Control   Posture Comments forward head, rounded shoulders, flexed posture with gait.    AROM   Overall AROM  Within functional limits for tasks performed   Strength   Overall Strength Within functional limits for tasks performed   Overall Strength Comments 4+/5 for bilateral UE and LE.    Ambulation/Gait   Gait Comments Pt ambulates with wide BOS, decreased cadence, decreased foot clearance with toe strike/flat foot, and decreased weight shift. Pt reports that she had difficulty walking from medical mall to Rehab waiting area.    Standardized Balance Assessment   Five times sit to stand comments  15.25 seconds without HHA (<82 years old; >10 seconds indicates fall.    10 Meter Walk 0.4 m/s without AD Indicates high fall risk.     Berg Balance Test   Sit to Stand Able to stand without using hands and stabilize independently   Standing Unsupported Able to stand safely 2 minutes   Sitting with Back Unsupported but Feet Supported on Floor or Stool Able to sit safely and securely 2 minutes   Stand to Sit Sits safely with minimal use of hands   Transfers Able to transfer safely, minor use of hands   Standing Unsupported with Eyes Closed Able to stand 10 seconds with supervision   Standing Ubsupported with Feet Together Able to place feet together independently and stand 1 minute safely   From Standing, Reach Forward with Outstretched Arm Can reach forward >5 cm safely (2")   From Standing Position, Pick up Object from Floor Able to pick up shoe, needs supervision   From Standing Position, Turn to Look Behind Over each Shoulder Looks behind from both sides and weight shifts well   Turn 360 Degrees Able to turn 360 degrees safely but slowly   Standing Unsupported, Alternately Place Feet on Step/Stool Able to complete >2 steps/needs minimal assist   Standing Unsupported, One Foot in Front Able to take small step independently and hold 30 seconds   Standing on One Leg Tries to lift  leg/unable to hold 3 seconds but remains standing independently   Total Score 42   Timed Up and Go Test   Normal TUG (seconds) 20.52 without AD   TUG Comments high fall risk                            PT Education - 10/14/14 1222    Education provided Yes   Education Details standardized outcomes, Plan of care.    Person(s) Educated Patient   Methods Explanation  Comprehension Verbalized understanding             PT Long Term Goals - 10/14/14 1232    PT LONG TERM GOAL #1   Title Patient will be independent with HEP to improve function and idependence by 12/09/2014    Time 8   Period Weeks   Status New   PT LONG TERM GOAL #2   Title Pt will improve BERG balance scale to >52 to incidcate increased function and decreased risk of falls by  12/09/2014    Time 8   Period Weeks   Status New   PT LONG TERM GOAL #3   Title Pt will improve gait speed to > 0.57m/s to be considered a community level ambulator to allow greater independence within the community by  12/09/2014    Time 8   Period Weeks   Status New   PT LONG TERM GOAL #4   Title Patient will increase TUG to >12 second to indicate imporved balance, and decreased risk for falls by  12/09/2014    Time 8   Period Weeks   Status New               Plan - 10/14/14 1223    Clinical Impression Statement Pt is a pleasant 54 year old female with a history of multiple brain tumor removal surgeries in 2007-2008. Pt had PT in 2015 for balance problems associated with brain tumor removal. Balance problems have started to become a problem for the patient again. Stregth and ROM are all WFL for both UE and LE. Pt demonstrates significant balance difficulties as evidenced by decreased gait speed (0.37m/s) low berg balance  scale ( 42/56, high fall risk ) slow 5 xSTS (15 seconds; >10 seconds indicates fall risk in patients <75 years old) and  Slow TUG (20 seconds; >12 seconds indicates fall risk) Pt ambulates with wide  BOS, decreaesd foot clearance and decreased cadence bilaterally, forward head posture and rounded shoulders, No AD used at time of eval.  PT provided education on the benefit of SPC with patients that have decreased balance. Skilled PT is recommended to improve balance and gait to allow pt to access community with greater independence. Patient is currently waiting on insurance approval and is not sure what her benefits will be. She is currently on hold and will consider therapy based on allowed rehab visits once insurance is approved as patient expressed financial hardship.    Pt will benefit from skilled therapeutic intervention in order to improve on the following deficits Abnormal gait;Decreased activity tolerance;Decreased balance;Decreased endurance;Decreased mobility;Decreased safety awareness;Difficulty walking   Rehab Potential Good   Clinical Impairments Affecting Rehab Potential Positive: benefit from last PT in 2015, negative: comorbidities and busy schedule   PT Frequency 1x / week   PT Duration 8 weeks   PT Treatment/Interventions ADLs/Self Care Home Management;Cryotherapy;Moist Heat;Gait training;Stair training;Functional mobility training;Therapeutic activities;Therapeutic exercise;Balance training;Neuromuscular re-education;Energy conservation;Patient/family education;Electrical Stimulation   PT Next Visit Plan HEP and balance training.    PT Home Exercise Plan will address at next visit    Consulted and Agree with Plan of Care Patient         Problem List Patient Active Problem List   Diagnosis Date Noted  . Sinusitis, acute 04/29/2013  . Routine general medical examination at a health care facility 03/12/2013  . Hyperglycemia 03/12/2013  . Astrocytoma 11/20/2011  . OBSTRUCTIVE SLEEP APNEA 12/22/2009  . DEPRESSION, RECURRENT 08/14/2007   Barrie Folk SPT 10/14/2014   5:20  PM   This entire session was performed under direct supervision and direction of a licensed  therapist . I have personally read, edited and approve of the note as written.  Hopkins,Margaret, PT, DPT 10/14/2014, 5:20 PM  [place] MAIN [facility] SERVICES [address] [place], [place], [address] Phone: [phone]   Fax:  [phone]

## 2014-11-18 ENCOUNTER — Telehealth: Payer: Self-pay | Admitting: Family Medicine

## 2014-11-18 NOTE — Telephone Encounter (Signed)
Pt dropped off handicap form to be signed. Please call pt when form is complete. Form in Dr. Damita Dunnings INbox

## 2014-11-19 NOTE — Telephone Encounter (Signed)
Left detailed message on voicemail. Form left at front desk for pick up.

## 2014-11-19 NOTE — Telephone Encounter (Signed)
Done, thanks

## 2014-11-22 ENCOUNTER — Encounter (INDEPENDENT_AMBULATORY_CARE_PROVIDER_SITE_OTHER): Payer: Self-pay

## 2014-11-22 ENCOUNTER — Encounter: Payer: Self-pay | Admitting: Family Medicine

## 2014-11-22 ENCOUNTER — Ambulatory Visit (INDEPENDENT_AMBULATORY_CARE_PROVIDER_SITE_OTHER): Payer: Medicare Other | Admitting: Family Medicine

## 2014-11-22 VITALS — BP 124/80 | HR 83 | Temp 99.0°F | Ht 59.0 in | Wt 199.4 lb

## 2014-11-22 DIAGNOSIS — H109 Unspecified conjunctivitis: Secondary | ICD-10-CM | POA: Diagnosis not present

## 2014-11-22 MED ORDER — POLYMYXIN B-TRIMETHOPRIM 10000-0.1 UNIT/ML-% OP SOLN: 1.0000 [drp] | Freq: Four times a day (QID) | OPHTHALMIC | Status: DC

## 2014-11-22 NOTE — Assessment & Plan Note (Signed)
New problem. Mild injection on exam. History consistent with bacterial conjunctivitis. Treating with Polytrim. Rx given.

## 2014-11-22 NOTE — Patient Instructions (Signed)
It was nice to see you today.  Use the drops every 6 hours x 5 days.  Follow up as needed.  Take care  Dr. Lacinda Axon

## 2014-11-22 NOTE — Progress Notes (Signed)
   Subjective:  Patient ID: Audrey Turner, female    DOB: [birth year]  Age: 54 y.o. MRN: [id number]  CC: ? Pink eye  HPI  54 year old female presents to the clinic today for acute visit with concern for conjunctivitis.  Patient reports a one-week history of right eye redness, burning, itching, and eyelid crusting.  No known inciting factor. She denies any recent sick contacts. No exacerbating factors. She's been using some eye drops with no improvement. No associated fevers or chills.  She reports slight decrease in visual acuity.   Social Hx - Nonsmoker.  Review of Systems  Constitutional: Negative for fever and chills.  Eyes: Positive for discharge, redness and itching.    Objective:  BP 124/80 mmHg  Pulse 83  Temp(Src) 99 F (37.2 C) (Oral)  Ht 4\' 11"  (1.499 m)  Wt 199 lb 6 oz (90.436 kg)  BMI 40.25 kg/m2  SpO2 97%  BP/Weight 11/22/2014 09/30/2013 4/33/2951  Systolic BP 884 166 063  Diastolic BP 80 70 84  Wt. (Lbs) 199.38 204.5 204  BMI 40.25 41.28 37.3   Physical Exam  Constitutional: She appears well-developed. No distress.  HENT:  Head: Normocephalic and atraumatic.  Mouth/Throat: No oropharyngeal exudate.  Eyes: Pupils are equal, round, and reactive to light. Right eye exhibits no discharge.  Mild right conjunctival injection. No drainage noted today.  Neck: Neck supple.  Cardiovascular: Normal rate and regular rhythm.   Pulmonary/Chest: Effort normal and breath sounds normal. No respiratory distress. She has no wheezes. She has no rales.  Musculoskeletal: She exhibits no edema.  Vitals reviewed.   Assessment & Plan:   Problem List Items Addressed This Visit    Conjunctivitis - Primary    New problem. Mild injection on exam. History consistent with bacterial conjunctivitis. Treating with Polytrim. Rx given.         Follow-up: PRN   Thersa Salt, DO

## 2014-11-22 NOTE — Progress Notes (Signed)
Pre visit review using our clinic review tool, if applicable. No additional management support is needed unless otherwise documented below in the visit note. 

## 2015-01-19 ENCOUNTER — Encounter: Payer: Self-pay | Admitting: Family Medicine

## 2015-01-19 ENCOUNTER — Ambulatory Visit (INDEPENDENT_AMBULATORY_CARE_PROVIDER_SITE_OTHER): Payer: Medicare Other | Admitting: Family Medicine

## 2015-01-19 VITALS — BP 128/86 | HR 80 | Temp 97.5°F | Wt 195.8 lb

## 2015-01-19 DIAGNOSIS — H109 Unspecified conjunctivitis: Secondary | ICD-10-CM

## 2015-01-19 MED ORDER — ERYTHROMYCIN 5 MG/GM OP OINT: 1.0000 "application " | TOPICAL_OINTMENT | Freq: Three times a day (TID) | OPHTHALMIC | Status: DC

## 2015-01-19 NOTE — Patient Instructions (Signed)
Stop the drops, change to ointment.  Call [facility] if not better tomorrow.  You may have a small corneal abrasion, but it is so small I can't be certain.  Take care.  Glad to see you.

## 2015-01-19 NOTE — Progress Notes (Signed)
Pre visit review using our clinic review tool, if applicable. No additional management support is needed unless otherwise documented below in the visit note.  1 month of R eye irritation and drainage, in spite of continued polytrim drops.  Vision is normal when she has the discharge cleaned out and lid opened but her lid ROM is limited from pain/irritation.  No L eye sx.  No FCNAVD.  R eye will get red and have a FB sensation.  No FB event, no trauma.  No ear pain.  No ST.   Has seen [facility] in the past.   Meds, vitals, and allergies reviewed.   ROS: See HPI.  Otherwise, noncontributory.  nad ncat except for old hairline changes from prev treatment L conjunctiva lids pupil wnl.  R lids slightly puffy.  R conjunctiva slightly injected. No FB seen.  No discharge PERRL, EOMI Fundus wnl B on limited exam.  Wood's lamp exam w/o definite finding seen.  She has a possible corneal abrasion but it is so small that even with magnification I can't be certain that I truly see it and that it really exists.  If it exists, it is at ~9 o'clock on the R eye. D/w pt at OV.

## 2015-01-20 NOTE — Assessment & Plan Note (Signed)
See above re: possible finding.  She has had sx for about 1 month.  I asked her to call about f/u with [facility] unless her sx were greatly improved tomorrow.  In meantime, stop her current drop and change to erythromyin ointment.   That may help with comfort, lubrication.  I don't she has a primary lid issue, but may have concurrent irritation from the conjunctival process that has been ongoing.   She agrees with plan.  Okay for outpatient f/u.

## 2015-11-08 ENCOUNTER — Ambulatory Visit: Payer: Commercial Managed Care - PPO | Admitting: Family Medicine

## 2015-11-09 ENCOUNTER — Encounter: Payer: Self-pay | Admitting: Family Medicine

## 2015-11-09 ENCOUNTER — Ambulatory Visit (INDEPENDENT_AMBULATORY_CARE_PROVIDER_SITE_OTHER): Payer: Medicare HMO | Admitting: Family Medicine

## 2015-11-09 VITALS — BP 104/70 | HR 64 | Temp 98.4°F | Ht 59.0 in | Wt 183.0 lb

## 2015-11-09 DIAGNOSIS — R29898 Other symptoms and signs involving the musculoskeletal system: Secondary | ICD-10-CM | POA: Diagnosis not present

## 2015-11-09 DIAGNOSIS — R739 Hyperglycemia, unspecified: Secondary | ICD-10-CM

## 2015-11-09 LAB — CBC WITH DIFFERENTIAL/PLATELET
BASOS ABS: 0 10*3/uL (ref 0.0–0.1)
Basophils Relative: 0.3 % (ref 0.0–3.0)
EOS ABS: 0.2 10*3/uL (ref 0.0–0.7)
Eosinophils Relative: 1.7 % (ref 0.0–5.0)
HEMATOCRIT: 39.4 % (ref 36.0–46.0)
HEMOGLOBIN: 13.1 g/dL (ref 12.0–15.0)
LYMPHS PCT: 46 % (ref 12.0–46.0)
Lymphs Abs: 4.1 10*3/uL — ABNORMAL HIGH (ref 0.7–4.0)
MCHC: 33.2 g/dL (ref 30.0–36.0)
MCV: 76.9 fl — ABNORMAL LOW (ref 78.0–100.0)
MONO ABS: 0.6 10*3/uL (ref 0.1–1.0)
Monocytes Relative: 6.3 % (ref 3.0–12.0)
Neutro Abs: 4.1 10*3/uL (ref 1.4–7.7)
Neutrophils Relative %: 45.7 % (ref 43.0–77.0)
PLATELETS: 368 10*3/uL (ref 150.0–400.0)
RBC: 5.12 Mil/uL — ABNORMAL HIGH (ref 3.87–5.11)
RDW: 15.7 % — ABNORMAL HIGH (ref 11.5–15.5)
WBC: 9 10*3/uL (ref 4.0–10.5)

## 2015-11-09 LAB — COMPREHENSIVE METABOLIC PANEL
ALT: 19 U/L (ref 0–35)
AST: 20 U/L (ref 0–37)
Albumin: 3.9 g/dL (ref 3.5–5.2)
Alkaline Phosphatase: 76 U/L (ref 39–117)
BILIRUBIN TOTAL: 0.4 mg/dL (ref 0.2–1.2)
BUN: 16 mg/dL (ref 6–23)
CALCIUM: 9.4 mg/dL (ref 8.4–10.5)
CO2: 29 meq/L (ref 19–32)
CREATININE: 0.69 mg/dL (ref 0.40–1.20)
Chloride: 103 mEq/L (ref 96–112)
GFR: 94.61 mL/min (ref >60.00)
Glucose, Bld: 95 mg/dL (ref 70–99)
Potassium: 4 mEq/L (ref 3.5–5.1)
SODIUM: 139 meq/L (ref 135–145)
Total Protein: 7.2 g/dL (ref 6.0–8.3)

## 2015-11-09 LAB — HEMOGLOBIN A1C: Hgb A1c MFr Bld: 5.5 % (ref 4.6–6.5)

## 2015-11-09 LAB — TSH: TSH: 2.04 u[IU]/mL (ref 0.35–4.50)

## 2015-11-09 LAB — VITAMIN B12: VITAMIN B 12: 579 pg/mL (ref 211–911)

## 2015-11-09 NOTE — Patient Instructions (Addendum)
Rosaria Ferries will call about your referral.  See her on the way out.  Go to the lab on the way out.  We'll contact you with your lab report. Take care.  Glad to see you.

## 2015-11-09 NOTE — Progress Notes (Signed)
Pre visit review using our clinic review tool, if applicable. No additional management support is needed unless otherwise documented below in the visit note. 

## 2015-11-09 NOTE — Progress Notes (Signed)
Saturday AM she stayed in bed longer than normal, until 7PM Saturday evening.  Eventually got up at night.  Has been in bed most of the days in the meantime.    [place] recently and hit L side of face on the toilet.  No LOC.  She remembers the fall.   Had been off ritalin the last few days, will restart today.  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    Fatigue and recent changes noted after going off ritalin but before the fall.    She has been relatively less mobile in general with likely deconditioning that contributed to the recent fall.    In wheelchair today and that is atypical for patient.    We talked about PT and nutrition.  She has done PT prev.  She is agreeable to PT.    She needs help with diet.  High carb, low nutrient foods at home.  D/w pt and family.  H/o hyperglycemia.   No Si/Hi.    Meds, vitals, and allergies reviewed.   ROS: Per HPI unless specifically indicated in ROS section   nad ncat No bruising on the face, TMs wnl OP wnl Neck supple, no LA Small bruise on the L upper lateral arm, not ttp rrr ctab abd soft In wheelchair.  Diffuse weakness x4 with normal sensation.  No focal unilateral weakness.

## 2015-11-10 DIAGNOSIS — R29898 Other symptoms and signs involving the musculoskeletal system: Secondary | ICD-10-CM | POA: Insufficient documentation

## 2015-11-10 NOTE — Assessment & Plan Note (Signed)
Refer for nutrition.  >25 minutes spent in face to face time with patient, >50% spent in counselling or coordination of care.

## 2015-11-10 NOTE — Assessment & Plan Note (Signed)
Needs PT, will set up.   Has family living at home with patient now.  Advised re: fall cautions and fall alert button, with contact info given.  Check basic labs.   D/w pt.  She agrees.  She has f/u with [facility] re: mood, as this may be affecting her situation . Still okay for outpatient f/u.

## 2015-11-16 ENCOUNTER — Encounter: Payer: Self-pay | Admitting: Physical Therapy

## 2015-11-16 ENCOUNTER — Ambulatory Visit: Payer: Medicare HMO | Attending: Family Medicine | Admitting: Physical Therapy

## 2015-11-16 DIAGNOSIS — R2681 Unsteadiness on feet: Secondary | ICD-10-CM | POA: Diagnosis present

## 2015-11-16 DIAGNOSIS — R262 Difficulty in walking, not elsewhere classified: Secondary | ICD-10-CM | POA: Diagnosis not present

## 2015-11-16 DIAGNOSIS — M6281 Muscle weakness (generalized): Secondary | ICD-10-CM | POA: Diagnosis present

## 2015-11-16 NOTE — Therapy (Signed)
[facility] [address] [place], [address] Phone: [phone]   Fax:  [phone]  Physical Therapy Evaluation  Patient Details  Name: Audrey Turner MRN: [id number] Date of Birth: 1960-11-11 Referring Provider: Laverle Patter  Encounter Date: 11/16/2015      PT End of Session - 11/16/15 1509    Visit Number 1   Number of Visits 25   Date for PT Re-Evaluation Dec 24, 2015   Authorization Type g codes   Authorization Time Period 1/10   PT Start Time 1505   PT Stop Time 1600   PT Time Calculation (min) 55 min   Equipment Utilized During Treatment Gait belt   Activity Tolerance Patient tolerated treatment well   Behavior During Therapy [facility] for tasks assessed/performed;Flat affect      Past Medical History:  Diagnosis Date  . Acute sinusitis, unspecified   . Astrocytoma [facility]) 01/2006 /09/2006   grade 3 atrocytoma s/p chemo/radiation/surgery at [place], in remission since at least 2011  . Diarrhea   . Drug induced neutropenia(288.03)   . Primary exertional headache   . Recurrent depression ([place])   . Vaginitis     Past Surgical History:  Procedure Laterality Date  . d&c miscarriage  1993  . left frontal craniotomy w/ left subtotal resection of left frontal brain tumor  01/10/2006  . [facility] brain tumor surgery  01/13/2006  . NSVD x 3 - premie at 22 wks    . resection residual tumor  10/08/2006   Dr. Tommi Rumps: [facility]  . skull surg  2009/2010/2012   3 times past brain surg to close up opening    There were no vitals filed for this visit.       Subjective Assessment - 11/16/15 1518    Subjective Pt presents with c/o difficulty walking with description of having a hard time taking a step and having to pause in between. She reports multiple falls. Pt walks household distances but used wheelchair for out of home mobility.   Pertinent History Brain tumor removal in 2007 and 2008.    Limitations  Walking;Standing   How long can you sit comfortably? not a problem    How long can you stand comfortably? 10 minutes due to weakness in LE and decreased balance    How long can you walk comfortably? household distances   Patient Stated Goals improve walking ability and strength   Currently in Pain? No/denies            [facility] PT Assessment - 11/16/15 0001      Assessment   Medical Diagnosis muscular deconditioning   Referring Provider Laverle Patter   Onset Date/Surgical Date 10/13/05   Hand Dominance Right   Prior Therapy yes, March to June 2015; and ~4 months in 2016     Precautions   Precautions Fall     Restrictions   Weight Bearing Restrictions No     Balance Screen   Has the patient fallen in the past 6 months Yes   How many times? multiple   Has the patient had a decrease in activity level because of a fear of falling?  Yes   Is the patient reluctant to leave their home because of a fear of falling?  Yes     [place];Other relatives   Available Help at Discharge Family   Type of [place] to  enter   Entrance Stairs-Number of Steps 4   Entrance Stairs-Rails Right;Left   Home Layout One level   Home Equipment Wheelchair - manual     Prior Function   Level of Independence Independent with basic ADLs   Vocation On disability   Comments Pt drives and is independent with basic ADLs, assistance for cooking and cleaning     Cognition   Overall Cognitive Status Within Functional Limits for tasks assessed       POSTURE/OBSERVATION: L lateral lean (seated), rounded shoulders, forward head   PROM/AROM: B UE and LE grossly WFL  STRENGTH:  Graded on a 0-5 scale Muscle Group Left Right  Hip Flex 4/5 4/5  Hip Abd 4-/5 4-/5  Hip Add 4/5 4/5  Hip Ext    Hip IR/ER    Knee Flex 4-/5 4/5  Knee Ext 4-/5 4/5  Ankle DF 4/5 4/5  Ankle PF     SENSATION: B LE intact  to light touch  SPECIAL TESTS: B finger to nose equal and intact; B Heel to shin equal and intact  FUNCTIONAL MOBILITY: Limited to household ambulation  BALANCE: Fair (-) standing balance, may need Berg Balance test  GAIT: Small step length, L longer than R, difficulty with motor planning/processing technique, pauses between each step Unsteady without AD, improved stability and mechanics with FWW  OUTCOME MEASURES: TEST Outcome Interpretation  5 times sit<>stand 34.04 sec >53 yo, >15 sec indicates increased risk for falls  Timed up and Go    1 min 16.48 sec <14 sec indicates increased risk for falls   Therex:  LAQs x10 each  Supine:  Heel slides, SLR, hip abd slides, hip add squeezes x10 each  HEP provided of these. See pt instructions.                      PT Education - 11/16/15 1508    Education provided Yes   Education Details HEP, see pt instructions, POC, ambulation with FWW   Person(s) Educated Patient   Methods Explanation;Demonstration;Handout   Comprehension Verbalized understanding;Returned demonstration             PT Long Term Goals - 11/16/15 1614      PT LONG TERM GOAL #1   Title Pt will score <15 seconds on 5x STS for reduced risk of falls and improved functional mobility within 12 weeks   Baseline 34.04 sec   Time 12   Period Weeks   Status New     PT LONG TERM GOAL #2   Title Pt will score<14 seconds on TUG with LRAD for improved functional mobility within 12 weeks.   Baseline 1 min 16.48 sec without AD, unsteady   Time 12   Period Weeks   Status New     PT LONG TERM GOAL #3   Title Pt will be independent with HEP for continued strength and endurance for improved functional mobility within 12 weeks.   Time 12   Period Weeks   Status New               Plan - 11/16/15 1606    Clinical Impression Statement 55 yo F presented to PT with c/o having increased difficulty walking and multiple falls. Her 5x STS was  34.04 sec and TUG 1 min 16.48 sec without AD. Pt demonstrates difficulty processing and executing technique for all mobility. She takes very small steps R less than L and has an anterior trunk lean. With use  of FWW for ambulation pt was able to significantly improve stability, upright posture and step length. Pt demonstrated additional trouble with turns and takes excessive time to complete and often unsafely. She reported feeling more stable with the FWW, "I like having something to hold onto".  During session pt became distaracted easily and required frequent cues for staying on task. She will benefit from skilled PT to address deficits of gait, balance, strength and endurance. PT recommended to pt and brother that pt look into getting a FWW as she ambulates the safest with AD at this time.   Rehab Potential Fair   Clinical Impairments Affecting Rehab Potential (+) good results from prior therapy, (-) history of brain tumor, comorbidities   PT Frequency 2x / week   PT Duration 12 weeks   PT Treatment/Interventions DME Instruction;Gait training;Stair training;Therapeutic activities;Therapeutic exercise;Balance training;Neuromuscular re-education;Patient/family education;Manual techniques   PT Next Visit Plan Berg?, gait training, strength/balance training   PT Home Exercise Plan LAQs; supine: heel slides, SLR, hip abd slide, hip add squeezes   Consulted and Agree with Plan of Care Patient      Patient will benefit from skilled therapeutic intervention in order to improve the following deficits and impairments:  Abnormal gait, Decreased balance, Decreased coordination, Decreased endurance, Decreased knowledge of use of DME, Decreased safety awareness, Decreased strength, Difficulty walking, Obesity  Visit Diagnosis: Difficulty in walking, not elsewhere classified - Plan: PT plan of care cert/re-cert  Unsteadiness on feet - Plan: PT plan of care cert/re-cert  Muscle weakness (generalized) - Plan:  PT plan of care cert/re-cert      G-Codes - [id number]    Functional Assessment Tool Used 5x STS, TUG   Functional Limitation Mobility: Walking and moving around   Mobility: Walking and Moving Around Current Status [phone]) At least 40 percent but less than 60 percent impaired, limited or restricted   Mobility: Walking and Moving Around Goal Status [phone]) At least 1 percent but less than 20 percent impaired, limited or restricted       Problem List Patient Active Problem List   Diagnosis Date Noted  . Muscular deconditioning 11/10/2015  . Conjunctivitis 11/22/2014  . Routine general medical examination at a health care facility 03/12/2013  . Hyperglycemia 03/12/2013  . Astrocytoma ([place]) 11/20/2011  . OBSTRUCTIVE SLEEP APNEA 12/22/2009  . DEPRESSION, RECURRENT 08/14/2007    Neoma Laming, PT, DPT  11/16/15, 5:34 PM [facility] [address] [place], [address] Phone: [phone]   Fax:  [phone]  Name: Audrey Turner MRN: [id number] Date of Birth: 1960/12/27

## 2015-11-16 NOTE — Patient Instructions (Signed)
HEP for LE strengthening: LAQs, supine: heel slides, SLR, hip abd slides, hip add squeezes 2x10 each. Created on [email or web address]

## 2015-11-21 ENCOUNTER — Ambulatory Visit: Payer: Medicare HMO | Admitting: Physical Therapy

## 2015-11-21 ENCOUNTER — Encounter: Payer: Self-pay | Admitting: Physical Therapy

## 2015-11-21 DIAGNOSIS — M6281 Muscle weakness (generalized): Secondary | ICD-10-CM

## 2015-11-21 DIAGNOSIS — R262 Difficulty in walking, not elsewhere classified: Secondary | ICD-10-CM | POA: Diagnosis not present

## 2015-11-21 DIAGNOSIS — R2681 Unsteadiness on feet: Secondary | ICD-10-CM

## 2015-11-21 NOTE — Therapy (Signed)
[facility] [address], [place], [address] Phone: [phone]   Fax:  [phone]  Physical Therapy Treatment  Patient Details  Name: Audrey Turner MRN: [id number] Date of Birth: 1961-03-16 Referring Provider: Laverle Patter  Encounter Date: 11/21/2015      PT End of Session - 11/21/15 1409    Visit Number 2   Number of Visits 25   Date for PT Re-Evaluation Dec 23, 2015   Authorization Type g codes   Authorization Time Period 2/10   PT Start Time 1359   PT Stop Time 1426   PT Time Calculation (min) 27 min   Equipment Utilized During Treatment Gait belt   Activity Tolerance Patient tolerated treatment well   Behavior During Therapy [facility] for tasks assessed/performed;Flat affect      Past Medical History:  Diagnosis Date  . Acute sinusitis, unspecified   . Astrocytoma [facility]) 01/2006 /09/2006   grade 3 atrocytoma s/p chemo/radiation/surgery at [place], in remission since at least 2011  . Diarrhea   . Drug induced neutropenia(288.03)   . Primary exertional headache   . Recurrent depression ([place])   . Vaginitis     Past Surgical History:  Procedure Laterality Date  . d&c miscarriage  1993  . left frontal craniotomy w/ left subtotal resection of left frontal brain tumor  01/10/2006  . [facility] brain tumor surgery  01/13/2006  . NSVD x 3 - premie at 22 wks    . resection residual tumor  10/08/2006   Dr. Tommi Rumps: [facility]  . skull surg  2009/2010/2012   3 times past brain surg to close up opening    There were no vitals filed for this visit.      Subjective Assessment - 11/21/15 1405    Subjective Pt reports she is doing well. She did only one HEP exercise so far. She forgot to do the rest. Pt has not yet looked into getting a FWW but plans to.   Pertinent History Brain tumor removal in 2007 and 2008.    Limitations Walking;Standing   How long can you sit comfortably? not a problem    How long can you  stand comfortably? 10 minutes due to weakness in LE and decreased balance    How long can you walk comfortably? household distances   Patient Stated Goals improve walking ability and strength      Therex:   LAQs with 4# 2 x10 each   Supine:   Heel slides, SLR, hip abd slides, hip add squeezes, clamshell 2x10 each SAQ with 4# 2x10 Bridges 2x10 STS without UE support 2x10  Mod cues for proper technique of exercises to target specific muscles and slow eccentric contractions for most effective strengthening.                           PT Education - 11/21/15 1409    Education provided Yes   Education Details reviewed HEP   Person(s) Educated Patient   Methods Explanation;Demonstration   Comprehension Verbalized understanding;Returned demonstration             PT Long Term Goals - 11/16/15 1614      PT LONG TERM GOAL #1   Title Pt will score <15 seconds on 5x STS for reduced risk of falls and improved functional mobility within 12 weeks   Baseline 34.04 sec   Time 12   Period Weeks   Status New  PT LONG TERM GOAL #2   Title Pt will score<14 seconds on TUG with LRAD for improved functional mobility within 12 weeks.   Baseline 1 min 16.48 sec without AD, unsteady   Time 12   Period Weeks   Status New     PT LONG TERM GOAL #3   Title Pt will be independent with HEP for continued strength and endurance for improved functional mobility within 12 weeks.   Time 12   Period Weeks   Status New               Plan - 11/21/15 1410    Clinical Impression Statement Pt  got her appointment time mixed up and arrived an hour late. She was able to perform increased reps of LE strengthenining exercises. Mod verbal and visual cues required for proper technique of exercises and staying on task. She will benefit from continued strengthening, gait and balance training to increase functional mobility.    Rehab Potential Fair   Clinical Impairments  Affecting Rehab Potential (+) good results from prior therapy, (-) history of brain tumor, comorbidities   PT Frequency 2x / week   PT Duration 12 weeks   PT Treatment/Interventions DME Instruction;Gait training;Stair training;Therapeutic activities;Therapeutic exercise;Balance training;Neuromuscular re-education;Patient/family education;Manual techniques   PT Next Visit Plan Berg?, gait training, strength/balance training   PT Home Exercise Plan LAQs; supine: heel slides, SLR, hip abd slide, hip add squeezes   Consulted and Agree with Plan of Care Patient      Patient will benefit from skilled therapeutic intervention in order to improve the following deficits and impairments:  Abnormal gait, Decreased balance, Decreased coordination, Decreased endurance, Decreased knowledge of use of DME, Decreased safety awareness, Decreased strength, Difficulty walking, Obesity  Visit Diagnosis: Difficulty in walking, not elsewhere classified  Unsteadiness on feet  Muscle weakness (generalized)     Problem List Patient Active Problem List   Diagnosis Date Noted  . Muscular deconditioning 11/10/2015  . Conjunctivitis 11/22/2014  . Routine general medical examination at a health care facility 03/12/2013  . Hyperglycemia 03/12/2013  . Astrocytoma ([place]) 11/20/2011  . OBSTRUCTIVE SLEEP APNEA 12/22/2009  . DEPRESSION, RECURRENT 08/14/2007    Neoma Laming, PT, DPT  11/21/15, 2:28 PM [facility] [address] [place], [address] Phone: [phone]   Fax:  [phone]  Name: CAYLEI HAWKIN MRN: [id number] Date of Birth: [birth year]

## 2015-11-23 ENCOUNTER — Ambulatory Visit: Payer: Medicare HMO | Admitting: Physical Therapy

## 2015-11-29 ENCOUNTER — Ambulatory Visit: Payer: Medicare HMO | Admitting: Physical Therapy

## 2015-11-29 ENCOUNTER — Encounter: Payer: Self-pay | Admitting: Physical Therapy

## 2015-11-29 DIAGNOSIS — M6281 Muscle weakness (generalized): Secondary | ICD-10-CM

## 2015-11-29 DIAGNOSIS — R262 Difficulty in walking, not elsewhere classified: Secondary | ICD-10-CM

## 2015-11-29 NOTE — Therapy (Signed)
[facility] [address], [place], [address] Phone: [phone]   Fax:  [phone]  Physical Therapy Treatment  Patient Details  Name: Audrey Turner MRN: [id number] Date of Birth: 11/09/1960 Referring Provider: Laverle Patter  Encounter Date: 11/29/2015      PT End of Session - 11/29/15 1120    Visit Number 3   Number of Visits 25   Date for PT Re-Evaluation 12/14/15   Authorization Type 3   Authorization Time Period 10 (G code)   PT Start Time 1040   PT Stop Time 1120   PT Time Calculation (min) 40 min   Activity Tolerance Patient tolerated treatment well   Behavior During Therapy [facility] for tasks assessed/performed;Flat affect      Past Medical History:  Diagnosis Date  . Acute sinusitis, unspecified   . Astrocytoma [facility]) 01/2006 /09/2006   grade 3 atrocytoma s/p chemo/radiation/surgery at [place], in remission since at least 2011  . Diarrhea   . Drug induced neutropenia(288.03)   . Primary exertional headache   . Recurrent depression ([place])   . Vaginitis     Past Surgical History:  Procedure Laterality Date  . d&c miscarriage  1993  . left frontal craniotomy w/ left subtotal resection of left frontal brain tumor  01/10/2006  . [facility] brain tumor surgery  01/13/2006  . NSVD x 3 - premie at 22 wks    . resection residual tumor  10/08/2006   Dr. Tommi Rumps: [facility]  . skull surg  2009/2010/2012   3 times past brain surg to close up opening    There were no vitals filed for this visit.      Subjective Assessment - 11/29/15 1041    Subjective Patient reports she she is weak in her legs and she is still having difficulty with walking .    Limitations Walking;Standing   Patient Stated Goals improve walking ability and strength   Currently in Pain? No/denies     Objective: Patient arrived in clinic in wheelchair with brother assisting her Observation: Posture: sitting: leaning to left able to  correct to more erect posture with verbal cuing  Treatment:  Therapeutic exercise: patient performed exercises with guidance, verbal and tactile cues and demonstration of PT: Sitting in chair:  Roll ball under each foot x 25 reps with 2# weight on ankles x 15 Hip abduction with resistive band x 15 reps transferred to treatment table using FWW and close supervision/contact guard x 1 and verbal cues for proper sequencing of walker and hands for sit <> stand Seated rows with  resistive band x 15 reps Tap balance stones alternately x 15 Knee flexion with red resistive band x 15 reps each   Patient response to treatment: repetition and moderate to constant cuing required for patient to perform all exercises with correct technique and to stay on task. She requires cues and supervision to transfer <> wheelchair and walk with FWW safely         PT Education - 11/29/15 1100    Education provided Yes   Education Details HEP: roll ball under  foot, tap feet on object on floor, hip adduction with ball and glute sets, hip abduction with band in sitting   Person(s) Educated Patient   Methods Explanation;Demonstration;Verbal cues;Handout   Comprehension Verbalized understanding;Returned demonstration;Verbal cues required             PT Long Term Goals - 11/16/15 1614  PT LONG TERM GOAL #1   Title Pt will score <15 seconds on 5x STS for reduced risk of falls and improved functional mobility within 12 weeks   Baseline 34.04 sec   Time 12   Period Weeks   Status New     PT LONG TERM GOAL #2   Title Pt will score<14 seconds on TUG with LRAD for improved functional mobility within 12 weeks.   Baseline 1 min 16.48 sec without AD, unsteady   Time 12   Period Weeks   Status New     PT LONG TERM GOAL #3   Title Pt will be independent with HEP for continued strength and endurance for improved functional mobility within 12 weeks.   Time 12   Period Weeks   Status New                Plan - 11/29/15 1130    Clinical Impression Statement Patient arrived late to therapy session. She requires constant verbal cuing to perform exercises and stay on task. She is able to walk wtih rolling walker with less difficulty and improved safety with close supervision. She will require additional physical therapy intervention to achieve goals and be able to transition to home program with assistance.    Rehab Potential Fair   PT Frequency 2x / week   PT Duration 12 weeks   PT Treatment/Interventions DME Instruction;Gait training;Stair training;Therapeutic activities;Therapeutic exercise;Balance training;Neuromuscular re-education;Patient/family education;Manual techniques   PT Next Visit Plan ther. ex for strength, balance, walking with walker   PT Home Exercise Plan sitting exercises with ball, ROM, strengthening      Patient will benefit from skilled therapeutic intervention in order to improve the following deficits and impairments:  Abnormal gait, Decreased balance, Decreased coordination, Decreased endurance, Decreased knowledge of use of DME, Decreased safety awareness, Decreased strength, Difficulty walking, Obesity  Visit Diagnosis: Difficulty in walking, not elsewhere classified  Muscle weakness (generalized)     Problem List Patient Active Problem List   Diagnosis Date Noted  . Muscular deconditioning 11/10/2015  . Conjunctivitis 11/22/2014  . Routine general medical examination at a health care facility 03/12/2013  . Hyperglycemia 03/12/2013  . Astrocytoma ([place]) 11/20/2011  . OBSTRUCTIVE SLEEP APNEA 12/22/2009  . DEPRESSION, RECURRENT 08/14/2007    Jomarie Longs PT 11/30/2015, 2:54 PM  [facility] [address] [place], [address] Phone: [phone]   Fax:  [phone]  Name: Audrey Turner MRN: [id number] Date of Birth: 02-Mar-1961

## 2015-12-01 ENCOUNTER — Encounter: Payer: Medicare HMO | Admitting: Physical Therapy

## 2015-12-06 ENCOUNTER — Ambulatory Visit: Payer: Medicare HMO | Admitting: Physical Therapy

## 2015-12-06 ENCOUNTER — Encounter: Payer: Self-pay | Admitting: Physical Therapy

## 2015-12-06 ENCOUNTER — Other Ambulatory Visit: Payer: Medicare Other

## 2015-12-06 DIAGNOSIS — R262 Difficulty in walking, not elsewhere classified: Secondary | ICD-10-CM | POA: Diagnosis not present

## 2015-12-06 DIAGNOSIS — M6281 Muscle weakness (generalized): Secondary | ICD-10-CM

## 2015-12-06 NOTE — Therapy (Signed)
[facility] [address], [place], [address] Phone: [phone]   Fax:  [phone]  Physical Therapy Treatment  Patient Details  Name: Audrey Turner MRN: [id number] Date of Birth: February 19, 1961 Referring Provider: Laverle Patter  Encounter Date: 12/06/2015      PT End of Session - 12/06/15 1055    Visit Number 4   Number of Visits 25   Date for PT Re-Evaluation 12/14/15   Authorization Type 4   Authorization Time Period 10 (G code)   PT Start Time 1020   PT Stop Time 1045   PT Time Calculation (min) 25 min   Activity Tolerance Patient tolerated treatment well   Behavior During Therapy [facility] for tasks assessed/performed;Flat affect      Past Medical History:  Diagnosis Date  . Acute sinusitis, unspecified   . Astrocytoma [facility]) 01/2006 /09/2006   grade 3 atrocytoma s/p chemo/radiation/surgery at [facility], in remission since at least 2011  . Diarrhea   . Drug induced neutropenia(288.03)   . Primary exertional headache   . Recurrent depression ([place])   . Vaginitis     Past Surgical History:  Procedure Laterality Date  . d&c miscarriage  1993  . left frontal craniotomy w/ left subtotal resection of left frontal brain tumor  01/10/2006  . [facility] brain tumor surgery  01/13/2006  . NSVD x 3 - premie at 22 wks    . resection residual tumor  10/08/2006   Dr. Tommi Rumps: [facility]  . skull surg  2009/2010/2012   3 times past brain surg to close up opening    There were no vitals filed for this visit.      Subjective Assessment - 12/06/15 1021    Subjective Patient reports being able to get up and move with less difficulty and has gotten a walker for support/safety.   Pertinent History Brain tumor removal in 2007 and 2008.    Limitations Walking;Standing   Currently in Pain? No/denies        Objective: Patient arrived in clinic in wheelchair with brother assisting her, rolling walker brought in to measure  and instruct in use Observation: Posture: sitting: leaning to left side less than previous session  Treatment:  Therapeutic exercise: patient performed exercises with guidance, verbal and tactile cues and demonstration of PT: Sitting in chair:  Roll ball under each foot x 25 reps with 2# weight on ankles x 15 Hip abduction with resistive band 2 x 15 reps transferred to treatment table using FWW and close supervision/contact guard x 1 and verbal cues for proper sequencing of walker and hands for sit <> stand Seated rows with  resistive band 2 x 15 reps, single arm row x 15 reps each UE Red resistive band for pull downs in sitting x 10 reps Seated reach out to sides with weight shifting to right and left hips x 10 reps LAQ 2 x 15 Knee flexion with red resistive band 2 x 15 reps each   Patient response to treatment: Patient required moderate cuing for staying on task and for proper sequencing of walker and safety. Patient able to tolerate increased resistance and repetitions as compared to previous session        PT Long Term Goals - 11/16/15 1614      PT LONG TERM GOAL #1   Title Pt will score <15 seconds on 5x STS for reduced risk of falls and improved functional mobility within 12 weeks   Baseline  34.04 sec   Time 12   Period Weeks   Status New     PT LONG TERM GOAL #2   Title Pt will score<14 seconds on TUG with LRAD for improved functional mobility within 12 weeks.   Baseline 1 min 16.48 sec without AD, unsteady   Time 12   Period Weeks   Status New     PT LONG TERM GOAL #3   Title Pt will be independent with HEP for continued strength and endurance for improved functional mobility within 12 weeks.   Time 12   Period Weeks   Status New               Plan - 12/06/15 1056    Clinical Impression Statement Patient arrived 15 min. late to session. She now has rolling walker to walk. She is able to tolerate increased intensity and repetitions with all exercises  today indicating improving strength and endurance.  She still requires constant verbal cuing for correct technique and to be able to perform all exercises.   Rehab Potential Fair   PT Frequency 2x / week   PT Duration 12 weeks   PT Treatment/Interventions    PT next visit plan  PT home exercise program DME Instruction;Gait training;Stair training;Therapeutic activities;Therapeutic exercise;Balance training;Neuromuscular re-education;Patient/family education;Manual techniques Continue with strength and endurance exercises  Continue with strengthening, ROM, core control exercises      Patient will benefit from skilled therapeutic intervention in order to improve the following deficits and impairments:  Abnormal gait, Decreased balance, Decreased coordination, Decreased endurance, Decreased knowledge of use of DME, Decreased safety awareness, Decreased strength, Difficulty walking, Obesity  Visit Diagnosis: Difficulty in walking, not elsewhere classified  Muscle weakness (generalized)     Problem List Patient Active Problem List   Diagnosis Date Noted  . Muscular deconditioning 11/10/2015  . Conjunctivitis 11/22/2014  . Routine general medical examination at a health care facility 03/12/2013  . Hyperglycemia 03/12/2013  . Astrocytoma ([place]) 11/20/2011  . OBSTRUCTIVE SLEEP APNEA 12/22/2009  . DEPRESSION, RECURRENT 08/14/2007    Jomarie Longs PT 12/07/2015, 1:48 PM  [facility] [address], [place], [address] Phone: [phone]   Fax:  [phone]  Name: Audrey Turner MRN: [id number] Date of Birth: [birth year]

## 2015-12-09 ENCOUNTER — Emergency Department (HOSPITAL_COMMUNITY)
Admission: EM | Admit: 2015-12-09 | Discharge: 2015-12-09 | Disposition: A | Discharge status: Home / Self Care | Payer: Medicare HMO | Attending: Emergency Medicine | Admitting: Emergency Medicine

## 2015-12-09 ENCOUNTER — Telehealth: Payer: Self-pay | Admitting: Family Medicine

## 2015-12-09 ENCOUNTER — Encounter: Payer: Self-pay | Admitting: Physical Therapy

## 2015-12-09 ENCOUNTER — Ambulatory Visit: Payer: Medicare HMO | Attending: Family Medicine | Admitting: Physical Therapy

## 2015-12-09 ENCOUNTER — Encounter (HOSPITAL_COMMUNITY): Payer: Self-pay | Admitting: *Deleted

## 2015-12-09 ENCOUNTER — Emergency Department (HOSPITAL_COMMUNITY): Payer: Medicare HMO

## 2015-12-09 DIAGNOSIS — G40909 Epilepsy, unspecified, not intractable, without status epilepticus: Secondary | ICD-10-CM | POA: Insufficient documentation

## 2015-12-09 DIAGNOSIS — Z8669 Personal history of other diseases of the nervous system and sense organs: Secondary | ICD-10-CM

## 2015-12-09 DIAGNOSIS — R569 Unspecified convulsions: Secondary | ICD-10-CM

## 2015-12-09 DIAGNOSIS — R262 Difficulty in walking, not elsewhere classified: Secondary | ICD-10-CM | POA: Insufficient documentation

## 2015-12-09 DIAGNOSIS — R791 Abnormal coagulation profile: Secondary | ICD-10-CM | POA: Diagnosis not present

## 2015-12-09 DIAGNOSIS — M6281 Muscle weakness (generalized): Secondary | ICD-10-CM | POA: Insufficient documentation

## 2015-12-09 DIAGNOSIS — Z85841 Personal history of malignant neoplasm of brain: Secondary | ICD-10-CM | POA: Diagnosis not present

## 2015-12-09 HISTORY — DX: Malignant (primary) neoplasm, unspecified: C80.1

## 2015-12-09 LAB — DIFFERENTIAL
BASOS PCT: 1 %
Basophils Absolute: 0.1 10*3/uL (ref 0.0–0.1)
EOS PCT: 1 %
Eosinophils Absolute: 0.1 10*3/uL (ref 0.0–0.7)
Lymphocytes Relative: 38 %
Lymphs Abs: 3.5 10*3/uL (ref 0.7–4.0)
MONO ABS: 0.5 10*3/uL (ref 0.1–1.0)
Monocytes Relative: 6 %
Neutro Abs: 5 10*3/uL (ref 1.7–7.7)
Neutrophils Relative %: 54 %

## 2015-12-09 LAB — I-STAT TROPONIN, ED: TROPONIN I, POC: 0.03 ng/mL (ref 0.00–0.08)

## 2015-12-09 LAB — CBC
HCT: 38.7 % (ref 36.0–46.0)
Hemoglobin: 13.3 g/dL (ref 12.0–15.0)
MCH: 25.4 pg — AB (ref 26.0–34.0)
MCHC: 34.4 g/dL (ref 30.0–36.0)
MCV: 73.9 fL — AB (ref 78.0–100.0)
PLATELETS: 309 10*3/uL (ref 150–400)
RBC: 5.24 MIL/uL — ABNORMAL HIGH (ref 3.87–5.11)
RDW: 15.6 % — AB (ref 11.5–15.5)
WBC: 9.3 10*3/uL (ref 4.0–10.5)

## 2015-12-09 LAB — ETHANOL

## 2015-12-09 LAB — URINALYSIS, ROUTINE W REFLEX MICROSCOPIC
BILIRUBIN URINE: NEGATIVE
Glucose, UA: NEGATIVE mg/dL
Hgb urine dipstick: NEGATIVE
Ketones, ur: NEGATIVE mg/dL
Leukocytes, UA: NEGATIVE
NITRITE: NEGATIVE
PH: 8 (ref 5.0–8.0)
Protein, ur: NEGATIVE mg/dL
SPECIFIC GRAVITY, URINE: 1.011 (ref 1.005–1.030)

## 2015-12-09 LAB — COMPREHENSIVE METABOLIC PANEL
ALK PHOS: 77 U/L (ref 38–126)
ALT: 23 U/L (ref 14–54)
ANION GAP: 11 (ref 5–15)
AST: 29 U/L (ref 15–41)
Albumin: 3.5 g/dL (ref 3.5–5.0)
BUN: 13 mg/dL (ref 6–20)
CALCIUM: 8.9 mg/dL (ref 8.9–10.3)
CHLORIDE: 103 mmol/L (ref 101–111)
CO2: 22 mmol/L (ref 22–32)
Creatinine, Ser: 0.78 mg/dL (ref 0.44–1.00)
Glucose, Bld: 109 mg/dL — ABNORMAL HIGH (ref 65–99)
Potassium: 4.1 mmol/L (ref 3.5–5.1)
SODIUM: 136 mmol/L (ref 135–145)
Total Bilirubin: 0.6 mg/dL (ref 0.3–1.2)
Total Protein: 6.6 g/dL (ref 6.5–8.1)

## 2015-12-09 LAB — RAPID URINE DRUG SCREEN, HOSP PERFORMED
AMPHETAMINES: NOT DETECTED
Barbiturates: NOT DETECTED
Benzodiazepines: NOT DETECTED
Cocaine: NOT DETECTED
OPIATES: NOT DETECTED
Tetrahydrocannabinol: NOT DETECTED

## 2015-12-09 LAB — PROTIME-INR
INR: 1.09
PROTHROMBIN TIME: 14.1 s (ref 11.4–15.2)

## 2015-12-09 LAB — I-STAT CHEM 8, ED
BUN: 17 mg/dL (ref 6–20)
CALCIUM ION: 1.06 mmol/L — AB (ref 1.15–1.40)
Chloride: 102 mmol/L (ref 101–111)
Creatinine, Ser: 0.7 mg/dL (ref 0.44–1.00)
Glucose, Bld: 106 mg/dL — ABNORMAL HIGH (ref 65–99)
HCT: 42 % (ref 36.0–46.0)
HEMOGLOBIN: 14.3 g/dL (ref 12.0–15.0)
Potassium: 4 mmol/L (ref 3.5–5.1)
SODIUM: 139 mmol/L (ref 135–145)
TCO2: 25 mmol/L (ref 0–100)

## 2015-12-09 LAB — APTT: aPTT: 26 seconds (ref 24–36)

## 2015-12-09 MED ORDER — LEVETIRACETAM 500 MG PO TABS
500.0000 mg | ORAL_TABLET | ORAL | Status: AC
  Administered 2015-12-09: 500 mg via ORAL
  Filled 2015-12-09: qty 1

## 2015-12-09 NOTE — Telephone Encounter (Signed)
Noted, thanks.  I'll await the ER notes.

## 2015-12-09 NOTE — ED Provider Notes (Addendum)
[facility] DEPT Provider Note   CSN: [id number] Arrival date & time: 12/09/15  1113   An emergency department physician performed an initial assessment on this suspected stroke patient at 1125.  History   Chief Complaint Chief Complaint  Patient presents with  . Seizures    HPI Audrey Turner is a 55 y.o. female.  HPI 55 y/o with L sided brain tumor s/p craniectomy comes in as code stroke. Per EMS, pt was with her family, and EMS was called due to change in mental status. EMS noted R sided weakness. Pt is a poor historian when it comes to events surrounding the weakness. No family at bedside. She states that she just didn't feel well prior to EMs coming to see her. Pt arrived here with no focal weakness in the extremities, and code stroke was cancelled after pt was seen by Neurology. Pt denies any hx of seizures.  Past Medical History:  Diagnosis Date  . Cancer [facility])    Brain     There are no active problems to display for this patient.   Past Surgical History:  Procedure Laterality Date  . [facility].    OB History    No data available       Home Medications    Prior to Admission medications   Not on File    Family History Family History  Problem Relation Age of Onset  . Hypertension Mother   . Hypertension Father     Social History Social History  Substance Use Topics  . Smoking status: Never Smoker  . Smokeless tobacco: Never Used  . Alcohol use No     Allergies   Dilantin [phenytoin sodium extended] and Penicillins   Review of Systems Review of Systems  ROS 10 Systems reviewed and are negative for acute change except as noted in the HPI.     Physical Exam Updated Vital Signs BP 134/81   Pulse 72   Temp 98.5 F (36.9 C) (Oral)   Resp 14   Wt 178 lb 2.1 oz (80.8 kg)   SpO2 100%   Physical Exam  Constitutional: She is oriented to person, place, and time. She appears well-developed.  HENT:  Head:  Normocephalic and atraumatic.  Eyes: Conjunctivae and EOM are normal. Pupils are equal, round, and reactive to light.  Neck: Normal range of motion. Neck supple.  Cardiovascular: Normal rate, regular rhythm and normal heart sounds.   Pulmonary/Chest: Effort normal and breath sounds normal. No respiratory distress.  Abdominal: Soft. Bowel sounds are normal. She exhibits no distension. There is no tenderness. There is no rebound and no guarding.  Neurological: She is alert and oriented to person, place, and time. Coordination normal.  Cerebellar exam is normal (finger to nose) Sensory exam normal for bilateral upper and lower extremities - and patient is able to discriminate between sharp and dull. Motor exam is 4+/5 bilaterally   Skin: Skin is warm and dry.     ED Treatments / Results  Labs (all labs ordered are listed, but only abnormal results are displayed) Labs Reviewed  CBC - Abnormal; Notable for the following:       Result Value   RBC 5.24 (*)    MCV 73.9 (*)    MCH 25.4 (*)    RDW 15.6 (*)    All other components within normal limits  COMPREHENSIVE METABOLIC PANEL - Abnormal; Notable for the following:    Glucose, Bld 109 (*)  All other components within normal limits  I-STAT CHEM 8, ED - Abnormal; Notable for the following:    Glucose, Bld 106 (*)    Calcium, Ion 1.06 (*)    All other components within normal limits  ETHANOL  PROTIME-INR  APTT  DIFFERENTIAL  URINE RAPID DRUG SCREEN, HOSP PERFORMED  URINALYSIS, ROUTINE W REFLEX MICROSCOPIC (NOT AT [facility])  Randolm Idol, ED    EKG  EKG Interpretation  Date/Time:  Friday December 09 2015 11:43:38 EDT Ventricular Rate:  74 PR Interval:    QRS Duration: 93 QT Interval:  423 QTC Calculation: 470 R Axis:   66 Text Interpretation:  Sinus rhythm Low voltage, precordial leads No acute changes No old tracing to compare Confirmed by Kathrynn Humble, MD, [facility] Comp [phone]) on 12/09/2015 12:04:05 PM       Radiology Ct Head  Code Stroke W/o Cm  Addendum Date: 12/09/2015   ADDENDUM REPORT: 12/09/2015 11:59 ADDENDUM: These results were called by telephone at the time of interpretation on 12/09/2015 at 11:56 am to Dr. Shon Hale, who verbally acknowledged these results. Electronically Signed   By: Logan Bores M.D.   On: 12/09/2015 11:59   Result Date: 12/09/2015 CLINICAL DATA:  Code stroke. Right-sided weakness. Facial droop. History of surgery for brain tumor. EXAM: CT HEAD WITHOUT CONTRAST TECHNIQUE: Contiguous axial images were obtained from the base of the skull through the vertex without intravenous contrast. COMPARISON:  Brain MRI 03/23/2015 FINDINGS: Sequelae of left frontal craniectomy are again identified with underlying left frontal lobe encephalomalacia and left lateral ventricle dilatation/porencephalic a. There is no evidence of acute cortical infarct, intracranial hemorrhage, mass, midline shift, or extra-axial fluid collection. Hypodensities in the cerebral white matter are nonspecific but may reflect chronic small vessel ischemic disease and/or post treatment changes. A chronic lacunar infarct in the left corona radiata is unchanged. Orbits are unremarkable. No acute osseous abnormality is identified. The visualized paranasal sinuses and mastoid air cells are clear. No hyperdense vessel. ASPECTS [facility] Stroke Program Early CT Score) - Ganglionic level infarction (caudate, lentiform nuclei, internal capsule, insula, M1-M3 cortex): 7 - Supraganglionic infarction (M4-M6 cortex): 3 Total score (0-10 with 10 being normal): 10 IMPRESSION: 1. No evidence of acute intracranial abnormality. 2. ASPECTS is 10. 3. Chronic post treatment changes with left frontal lobe encephalomalacia. The stroke service has been paged. Electronically Signed: By: Logan Bores M.D. On: 12/09/2015 11:36    Procedures Procedures (including critical care time)  Medications Ordered in ED Medications  levETIRAcetam (KEPPRA) tablet 500 mg (500 mg Oral  Given 12/09/15 1325)     Initial Impression / Assessment and Plan / ED Course  I have reviewed the triage vital signs and the nursing notes.  Pertinent labs & imaging results that were available during my care of the patient were reviewed by me and considered in my medical decision making (see chart for details).  Clinical Course  Comment By Time  Pt's brother confirmed with neuro tean that pt had a seizure like episode-  R eye deviation, RUE shaking was noted by him. Neuro team thinks pt likely had seizure as well and recommends keppra. Varney Biles, MD 09/01 1338  No repeat episodes of seizure.  We started pt on keppra - and pt is aware of the need for f/u and starting keppra with escalating dose. Varney Biles, MD 09/01 1519   DDx: -Seizure disorder -Meningitis -Trauma -ICH -Electrolyte abnormality -Metabolic derangement -Stroke -Toxin induced seizures -Medication side effects - Tumor  PT comes in with R  sided weakness, that has resolved. Seizure vs. TIA. Neuro team at bedside.  CT head and labs are reassuring. Pt is aox3.   Final Clinical Impressions(s) / ED Diagnoses   Final diagnoses:  Seizure ([place])    New Prescriptions There are no discharge medications for this patient.    Varney Biles, MD 12/09/15 1345    Varney Biles, MD 12/09/15 1520

## 2015-12-09 NOTE — Consult Note (Signed)
NEURO HOSPITALIST CONSULT NOTE   Requestig physician: Dr. Kathrynn Humble   Reason for Consult: Code stroke   History obtained from:  Patient's brother  HPI:                                                                                                                                          Audrey Turner is an 55 y.o. 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 Patient was at rehabilitation this morning and per brother stated that she felt well enough to look for a mattress. Upon exiting her Lucianne Lei the brother noted she suddenly stopped and could not move this was followed by right arm jerking. At this point other stim buyers helped slowly bring her to the ground. Per brother after the seizure activity he noted the eye slowly move into a rightward looking direction and she could not get them back to midline. After few minutes the eyes on back to midline and patient was slowly able to talk but was weak on the right arm. Patient has no history of seizure. Patient states that she does not feel that she has any type of infection and she has been getting good sleep. Currently she is back to her baseline.  The majority of her neurological care has been at [facility] with her neuro-oncologist. She did have her surgery here at [facility].  Past Medical History:  Diagnosis Date  . Cancer [facility])    Brain     Past Surgical History:  Procedure Laterality Date  . [facility].    Family History  Problem Relation Age of Onset  . Hypertension Mother   . Hypertension Father       Social History:  reports that she has never smoked. She has never used smokeless tobacco. She reports that she does not drink alcohol or use drugs.  Allergies not on file  MEDICATIONS:                                                                                                                      Current Facility-Administered Medications  Medication Dose Route Frequency Provider Last Rate Last Dose  . levETIRAcetam (  KEPPRA) tablet 500 mg  500 mg Oral STAT Marliss Coots, PA-C       No current outpatient prescriptions on file.      ROS:                                                                                                                                       History obtained from the patient  General ROS: negative for - chills, fatigue, fever, night sweats, weight gain or weight loss Psychological ROS: negative for - behavioral disorder, hallucinations, memory difficulties, mood swings or suicidal ideation Ophthalmic ROS: negative for - blurry vision, double vision, eye pain or loss of vision ENT ROS: negative for - epistaxis, nasal discharge, oral lesions, sore throat, tinnitus or vertigo Allergy and Immunology ROS: negative for - hives or itchy/watery eyes Hematological and Lymphatic ROS: negative for - bleeding problems, bruising or swollen lymph nodes Endocrine ROS: negative for - galactorrhea, hair pattern changes, polydipsia/polyuria or temperature intolerance Respiratory ROS: negative for - cough, hemoptysis, shortness of breath or wheezing Cardiovascular ROS: negative for - chest pain, dyspnea on exertion, edema or irregular heartbeat Gastrointestinal ROS: negative for - abdominal pain, diarrhea, hematemesis, nausea/vomiting or stool incontinence Genito-Urinary ROS: negative for - dysuria, hematuria, incontinence or urinary frequency/urgency Musculoskeletal ROS: negative for - joint swelling or muscular weakness Neurological ROS: as noted in HPI Dermatological ROS: negative for rash and skin lesion changes   Blood pressure 133/70, pulse 78, temperature 98.5 F (36.9 C), temperature source Oral, resp. rate 18, weight 80.8 kg (178 lb 2.1 oz), SpO2 96 %.   Neurologic Examination:                                                                                                       HEENT-  Normocephalic, no lesions, without obvious abnormality.  Normal external eye and conjunctiva.  Normal TM's bilaterally.  Normal auditory canals and external ears. Normal external nose, mucus membranes and septum.  Normal pharynx. Cardiovascular- S1, S2 normal, pulses palpable throughout   Lungs- chest clear, no wheezing, rales, normal symmetric air entry Abdomen- normal findings: bowel sounds normal Extremities- no edema Lymph-no adenopathy palpable Musculoskeletal-no joint tenderness, deformity or swelling Skin-warm and dry, no hyperpigmentation, vitiligo, or suspicious lesions  Neurological Examination Mental Status: Alert, oriented, thought content appropriate.  Speech fluent without evidence of aphasia but at times does have some word finding difficulties but this is residual from her previous tumor resection..  Able to follow 3 step  commands without difficulty. Cranial Nerves: II: Discs flat bilaterally; Visual fields grossly normal, pupils equal, round, reactive to light and accommodation III,IV, VI: ptosis not present, extra-ocular motions intact bilaterally V,VII: smile symmetric, she does have a slight right nasolabial fold flattening at rest, facial light touch sensation normal bilaterally VIII: hearing normal bilaterally IX,X: uvula rises symmetrically XI: bilateral shoulder shrug XII: midline tongue extension Motor: Right : Upper extremity   5/5    Left:     Upper extremity   5/5  Lower extremity   5/5     Lower extremity   5/5 When arms are held out right she does have a slight tremor however she does state that she is very nervous at this time. Tone and bulk:normal tone throughout; no atrophy noted Sensory: Pinprick and light touch intact throughout, bilaterally Deep Tendon Reflexes: 2+ and symmetric throughout bilateral upper extremities bilateral knees. No ankle jerks were noted. Plantars: Right: downgoing   Left:  downgoing Cerebellar: normal finger-to-nose,  and normal heel-to-shin test Gait: Not tested      Lab Results: Basic Metabolic Panel:  Recent Labs Lab 12/09/15 1117 12/09/15 1126  NA 136 139  K 4.1 4.0  CL 103 102  CO2 22  --   GLUCOSE 109* 106*  BUN 13 17  CREATININE 0.78 0.70  CALCIUM 8.9  --     Liver Function Tests:  Recent Labs Lab 12/09/15 1117  AST 29  ALT 23  ALKPHOS 77  BILITOT 0.6  PROT 6.6  ALBUMIN 3.5   No results for input(s): LIPASE, AMYLASE in the last 168 hours. No results for input(s): AMMONIA in the last 168 hours.  CBC:  Recent Labs Lab 12/09/15 1117 12/09/15 1126  WBC 9.3  --   NEUTROABS 5.0  --   HGB 13.3 14.3  HCT 38.7 42.0  MCV 73.9*  --   PLT 309  --     Cardiac Enzymes: No results for input(s): CKTOTAL, CKMB, CKMBINDEX, TROPONINI in the last 168 hours.  Lipid Panel: No results for input(s): CHOL, TRIG, HDL, CHOLHDL, VLDL, LDLCALC in the last 168 hours.  CBG: No results for input(s): GLUCAP in the last 168 hours.  Microbiology: No results found for this or any previous visit.  Coagulation Studies:  Recent Labs  12/09/15 1117  LABPROT 14.1  INR 1.09    Imaging: Ct Head Code Stroke W/o Cm  Addendum Date: 12/09/2015   ADDENDUM REPORT: 12/09/2015 11:59 ADDENDUM: These results were called by telephone at the time of interpretation on 12/09/2015 at 11:56 am to Dr. Shon Hale, who verbally acknowledged these results. Electronically Signed   By: Logan Bores M.D.   On: 12/09/2015 11:59   Result Date: 12/09/2015 CLINICAL DATA:  Code stroke. Right-sided weakness. Facial droop. History of surgery for brain tumor. EXAM: CT HEAD WITHOUT CONTRAST TECHNIQUE: Contiguous axial images were obtained from the base of the skull through the vertex without intravenous contrast. COMPARISON:  Brain MRI 03/23/2015 FINDINGS: Sequelae of left frontal craniectomy are again identified with underlying left frontal lobe encephalomalacia and left lateral  ventricle dilatation/porencephalic a. There is no evidence of acute cortical infarct, intracranial hemorrhage, mass, midline shift, or extra-axial fluid collection. Hypodensities in the cerebral white matter are nonspecific but may reflect chronic small vessel ischemic disease and/or post treatment changes. A chronic lacunar infarct in the left corona radiata is unchanged. Orbits are unremarkable. No acute osseous abnormality is identified. The visualized paranasal sinuses and mastoid air cells are clear. No hyperdense vessel.  ASPECTS [facility] Stroke Program Early CT Score) - Ganglionic level infarction (caudate, lentiform nuclei, internal capsule, insula, M1-M3 cortex): 7 - Supraganglionic infarction (M4-M6 cortex): 3 Total score (0-10 with 10 being normal): 10 IMPRESSION: 1. No evidence of acute intracranial abnormality. 2. ASPECTS is 10. 3. Chronic post treatment changes with left frontal lobe encephalomalacia. The stroke service has been paged. Electronically Signed: By: Logan Bores M.D. On: 12/09/2015 11:36       Assessment and plan per attending neurologist  Etta Quill PA-C [facility] [phone]  12/09/2015, 12:20 PM   Assessment/Plan: This is a pleasant 55 year old female presenting to the hospital as a code stroke. After further history was obtained by the brother he describes very clearly a complex partial seizure involving the right arm. The seizure was followed by a Todd's paralysis of the right arm and a postictal state. At this time patient is at baseline. Given her right frontal tumor resection she has significant risk to have seizures.  Recommendation: 1-at this time would give 1 dose of 500 mg Keppra orally while in ED 2-continue Keppra 500 mg oral twice a day for 1 week (7 days) 3-after 7 days of Keppra 500 mg orally twice a day would increase the dose to 1000 mg orally twice a day 4-will make an amplitude for referral to Dr. Marland Kitchen  neurology to be seen  in 2 weeks.   Neurology Attending Addendum  This patient was seen, examined, and d/w PA. I have reviewed the note and agree with the findings, assessment and plan as documented with the following additions.   The patient was seen in the emergency department as a code stroke. I was present for the entire consult with the PA. The most useful history is obtained from the patient's brother. He reports that they were walking into a store when the patient started to have difficulty moving her right leg. This was followed by some shaking in her right arm. When he looked at her, he noted that her eyes were deviated to the right and she was unable to move them past midline. She was also unable to speak. She has a history of a left frontal brain tumor that has been resected. She denies any prior history of seizure.  On examination, she seems to have some occasional hesitancy with word finding. She is not overtly aphasic. There is some flattening of the right nasolabial fold at rest but she has normal facial mobility. The remainder of her elemental neurologic examination is unremarkable.  I have personally and independently reviewed the CT scan of the head without contrast from today. This shows evidence of encephalomalacia in the right frontal lobe consistent with prior tumor resection. There is ex vacuo enlargement of the frontal horn of the left lateral ventricle. No acute abnormality is noted.  Impression: 1. New onset seizure, most likely complex partial based on history 2. History of brain tumor  Recommendations:  These are as per PA note. Given her underlying encephalomalacia, her risk of recurrent seizures is significant. No clear provoking factor was identified for today's episode. I will check urinalysis to exclude UTI. I have recommended that she be started on Keppra with instructions as outlined in PA note. Potential side effects including sedation and behavioral changes were discussed with the  patient and her brother. They are agreeable to starting the medication. Follow-up will be arranged with outpatient neurology. Seizure first aid was discussed with the patient's brother. Seizure precautions were discussed with both patient and  her brother as follows:  Per [employer], patients with seizures are not allowed to drive until they have been seizure-free for six months. Use caution when using heavy equipment or power tools. Avoid working on ladders or at heights. Take showers instead of baths. Ensure the water temperature is not too high on the home water heater. Do not go swimming alone. When caring for infants or small children, sit down when holding, feeding, or changing them to minimize risk of injury to the child in the event you have a seizure.

## 2015-12-09 NOTE — ED Notes (Signed)
Took Pt to toilet with wheelchair. Little ambulation with a lot of assistance. Pt could not give urine sample because she wet the bed. Linens changed.

## 2015-12-09 NOTE — Code Documentation (Signed)
55yo female arriving to [facility] via [place] at 1113.  Patient was at [employer] with her brother when she wen to get out of the car and fell.  EMS assessed right facial droop and right sided weakness as well as difficulty focusing when asked questions.  Code stroke activated.  Stroke team at the bedside on patient arrival.  Labs drawn and patient to CT with team.  CT completed.  Patient reports feeling weak in bilateral lower extremities during episode.  Patient with h/o brain tumor with resection and treatment.  NIHSS 0, see documentation for details and code stroke times.  Patient with resolved symptoms and reports feeling back to baseline other than feeling "nervous."  TIA alert.  Bedside handoff with ED RN Luellen Pucker.

## 2015-12-09 NOTE — ED Triage Notes (Signed)
EMS transported PT to ED for stroke SX's , PT's  Grip on RT side was weaker than LT . Pt was reported to have weakness on the RT side when she was getting out of her car . EMS was called .

## 2015-12-09 NOTE — Therapy (Signed)
[place] [facility] [address], [place], [address] Phone: [phone]   Fax:  [phone]  Physical Therapy Treatment  Patient Details  Name: Audrey Turner MRN: [id number] Date of Birth: [birth year] Referring Provider: Laverle Patter  Encounter Date: 12/09/2015      PT End of Session - 12/09/15 0944    Visit Number 5   Number of Visits 25   Date for PT Re-Evaluation 02/08/16   Authorization Type 5   Authorization Time Period 10 (G code)   PT Start Time 0927   PT Stop Time 1015   PT Time Calculation (min) 48 min   Equipment Utilized During Treatment Gait belt   Activity Tolerance Patient tolerated treatment well;Patient limited by fatigue   Behavior During Therapy [facility] for tasks assessed/performed;Flat affect      Past Medical History:  Diagnosis Date  . Acute sinusitis, unspecified   . Astrocytoma [facility]) 01/2006 /09/2006   grade 3 atrocytoma s/p chemo/radiation/surgery at [place], in remission since at least 2011  . Diarrhea   . Drug induced neutropenia(288.03)   . Primary exertional headache   . Recurrent depression ([place])   . Vaginitis     Past Surgical History:  Procedure Laterality Date  . d&c miscarriage  1993  . left frontal craniotomy w/ left subtotal resection of left frontal brain tumor  01/10/2006  . [facility] brain tumor surgery  01/13/2006  . NSVD x 3 - premie at 22 wks    . resection residual tumor  10/08/2006   Dr. Tommi Rumps: [facility]  . skull surg  2009/2010/2012   3 times past brain surg to close up opening    There were no vitals filed for this visit.      Subjective Assessment - 12/09/15 0943    Subjective Patient reports she is getting up more at home using walker. Legs still feel heavy.    Pertinent History Brain tumor removal in 2007 and 2008.    Limitations Walking;Standing   Patient Stated Goals improve walking ability and strength   Currently in Pain? No/denies       Objective: Patient arrived in clinic in wheelchair with brother assisting her, rolling walker brought in to measure and instruct in use Observation: Posture: sitting: leaning to left side less than previous session  Treatment:  Therapeutic exercise: patient performed exercises with guidance, verbal and tactile cues and demonstration of PT: Sitting in chair:  Roll ball under each foot x 25 reps with 3# weight on ankles x 15 Hip abduction with resistive band 2 x 20 reps Seated rows with resistive band 2 x 15 reps, single arm row x 15 reps each UE Red resistive band for pull downs in sitting x 10 reps Seated reach out to sides with weight shifting to right and left hips x 10 reps LAQ 2 x 15 3# weights on ankles Knee flexion with red resistive band 2 x 20 reps each Worked on ambulation with rolling walker with proper sequencing, staying close to walker for balance and safety, ambulated to and from treatment room and out to car, gait belt in place for safety, contact guard x 1 with minimal assist to sequence walker as needed   Patient response to treatment: Patient required constant cuing for proper sequencing of walker, improved ability to walk safely following demonstration. Patient demonstrated improved strength and motor control with all exercises        PT Education - 12/09/15 1024  Education provided Yes   Education Details HEP: use of walker with proper positoining of feet for safety, exercises for strengthening   Person(s) Educated Patient   Methods Explanation;Demonstration;Verbal cues   Comprehension Verbalized understanding;Returned demonstration;Verbal cues required             PT Long Term Goals - 11/16/15 1614      PT LONG TERM GOAL #1   Title Pt will score <15 seconds on 5x STS for reduced risk of falls and improved functional mobility within 12 weeks   Baseline 34.04 sec   Time 12   Period Weeks   Status New     PT LONG TERM GOAL #2   Title Pt  will score<14 seconds on TUG with LRAD for improved functional mobility within 12 weeks.   Baseline 1 min 16.48 sec without AD, unsteady   Time 12   Period Weeks   Status New     PT LONG TERM GOAL #3   Title Pt will be independent with HEP for continued strength and endurance for improved functional mobility within 12 weeks.   Time 12   Period Weeks   Status New               Plan - 12/09/15 0953    Clinical Impression Statement Patient arrived 10 min. late to session. She demonstrates improving mobility with using rolling walker; requires constant tactile and verblal cuing for correct sequencing and placement of hands and feet for safety. She demonstrates improved strength as noted with more controlled motion during exercises for LE's with less shaking/weakness.    PT Frequency 2x / week   PT Duration 12 weeks   PT Treatment/Interventions DME Instruction;Gait training;Stair training;Therapeutic activities;Therapeutic exercise;Balance training;Neuromuscular re-education;Patient/family education;Manual techniques   PT Next Visit Plan ther. ex for strength, balance, walking with walker   PT Home Exercise Plan sitting exercises with ball, ROM, strengthening, core control, scapular retraction, reach out to each side      Patient will benefit from skilled therapeutic intervention in order to improve the following deficits and impairments:  Abnormal gait, Decreased balance, Decreased coordination, Decreased endurance, Decreased knowledge of use of DME, Decreased safety awareness, Decreased strength, Difficulty walking, Obesity  Visit Diagnosis: Muscle weakness (generalized)  Difficulty in walking, not elsewhere classified     Problem List Patient Active Problem List   Diagnosis Date Noted  . Muscular deconditioning 11/10/2015  . Conjunctivitis 11/22/2014  . Routine general medical examination at a health care facility 03/12/2013  . Hyperglycemia 03/12/2013  . Astrocytoma  ([place]) 11/20/2011  . OBSTRUCTIVE SLEEP APNEA 12/22/2009  . DEPRESSION, RECURRENT 08/14/2007    Jomarie Longs PT 12/09/2015, 8:57 PM  [place] [facility] [address] [place], [address] Phone: [phone]   Fax:  [phone]  Name: SAISHA GRAPES MRN: [id number] Date of Birth: [birth year]

## 2015-12-09 NOTE — Telephone Encounter (Signed)
I call pt to remind her of her appointment on Tuesday.  Her brother answered the phone stated she was being rushed to [place].  She had PT today and then stopped at a store and had a seziure.

## 2015-12-09 NOTE — Discharge Instructions (Signed)
You had a seizure. Neurology team saw you and wants you to be started on keppra. - Continue Keppra 500 mg by mouth twice a day for 1 week (7 days). After 7 days increase the keppra dose to 1000 mg twice a day.  See the neurologist as recommended.

## 2015-12-11 ENCOUNTER — Encounter: Payer: Self-pay | Admitting: Physical Therapy

## 2015-12-13 ENCOUNTER — Ambulatory Visit (INDEPENDENT_AMBULATORY_CARE_PROVIDER_SITE_OTHER): Payer: Medicare HMO | Admitting: Family Medicine

## 2015-12-13 ENCOUNTER — Encounter: Payer: Self-pay | Admitting: Family Medicine

## 2015-12-13 VITALS — BP 124/70 | HR 69 | Temp 98.7°F | Wt 174.5 lb

## 2015-12-13 DIAGNOSIS — R569 Unspecified convulsions: Secondary | ICD-10-CM | POA: Diagnosis not present

## 2015-12-13 DIAGNOSIS — Z0000 Encounter for general adult medical examination without abnormal findings: Secondary | ICD-10-CM

## 2015-12-13 DIAGNOSIS — Z7189 Other specified counseling: Secondary | ICD-10-CM

## 2015-12-13 MED ORDER — LEVETIRACETAM 500 MG PO TABS: ORAL_TABLET | ORAL | 0 refills | Status: DC

## 2015-12-13 NOTE — Progress Notes (Signed)
Pre visit review using our clinic review tool, if applicable. No additional management support is needed unless otherwise documented below in the visit note. 

## 2015-12-13 NOTE — Assessment & Plan Note (Signed)
Brother Audrey Turner designated if patient were incapacitated.

## 2015-12-13 NOTE — Patient Instructions (Addendum)
Audrey Turner will call about your referral to Dr. Marland Kitchen neurology. Start keppra in the meantime.  Start 1 pill twice a day for 1 week, then 2 pills twice a day thereafter.   Take care.  Glad to see you.

## 2015-12-13 NOTE — Progress Notes (Signed)
I have personally reviewed the Medicare Annual Wellness questionnaire and have noted 1. The patient's medical and social history 2. Their use of alcohol, tobacco or illicit drugs 3. Their current medications and supplements 4. The patient's functional ability including ADL's, fall risks, home safety risks and hearing or visual             impairment. 5. Diet and physical activities 6. Evidence for depression or mood disorders  The patients weight, height, BMI have been recorded in the chart and visual acuity is per eye clinic.  I have made referrals, counseling and provided education to the patient based review of the above and I have provided the pt with a written personalized care plan for preventive services.  Provider list updated- see scanned forms.  Routine anticipatory guidance given to patient.  See health maintenance.  Flu defer for now, given recent events Shingles not due PNA defer for now, given recent events Tetanus 2014 Colonoscopy 2015 Breast cancer screening per GYN clinic.  DXA per GYN clinic Pap per GYN clinic.   Advance directive- brother [name] designated if patient were incapacitated.   Cognitive function addressed- see scanned forms- and if abnormal then additional documentation follows.  [facility].   ER eval recently.  Here today with brother.  R arm movement at time of event- dx'd as SZ. She had post ictal state.   CT neg at ER.  She hasn't been started on keppra in the meantime and needs referral to neuro.  D/w pt about path/phys of SZ, hx surgical hx, and rationale for meds.  D/w pt about driving cautions.   PMH and SH reviewed  Meds, vitals, and allergies reviewed.   ROS: Per HPI.  Unless specifically indicated otherwise in HPI, the patient denies:  General: fever. Eyes: acute vision changes ENT: sore throat Cardiovascular: chest pain Respiratory: SOB GI: vomiting GU:  dysuria Musculoskeletal: acute back pain Derm: acute rash Neuro: acute motor dysfunction Psych: worsening mood Endocrine: polydipsia Heme: bleeding Allergy: hayfever  GEN: nad, alert and oriented HEENT: mucous membranes moist NECK: supple w/o LA CV: rrr. PULM: ctab, no inc wob ABD: soft, +bs EXT: no edema SKIN: no acute rash

## 2015-12-14 ENCOUNTER — Telehealth: Payer: Self-pay | Admitting: Family Medicine

## 2015-12-14 ENCOUNTER — Ambulatory Visit: Payer: Medicare HMO | Admitting: Physical Therapy

## 2015-12-14 DIAGNOSIS — R569 Unspecified convulsions: Secondary | ICD-10-CM | POA: Insufficient documentation

## 2015-12-14 NOTE — Telephone Encounter (Signed)
Message routed

## 2015-12-14 NOTE — Telephone Encounter (Signed)
Dr Damita Dunnings sent message to doctors by routing a note by way of epic to them.

## 2015-12-14 NOTE — Telephone Encounter (Signed)
Please send OV note, ER notes to MDs below.  Same address, different phone numbers.    Started on keppra for seizure with local neurology referral done.  Thanks.   Davonna Belling, MD  [facility] [place], [place] [address]  [phone]  [phone] ([address])   Unice Cobble, MD  [address]  [place], [place] [address]  [phone]  [phone] (Fax)

## 2015-12-14 NOTE — Assessment & Plan Note (Signed)
CT neg at ER.  She hasn't been started on keppra in the meantime and needs referral to neuro.  D/w pt about path/phys of SZ, hx surgical hx, and rationale for meds.  D/w pt about driving cautions.  Referral done, Keppra rx sent for patient.  Will notify her MD at [facility] as [place].

## 2015-12-14 NOTE — Assessment & Plan Note (Signed)
Flu defer for now, given recent events Shingles not due PNA defer for now, given recent events Tetanus 2014 Colonoscopy 2015 Breast cancer screening per GYN clinic.  DXA per GYN clinic Pap per GYN clinic.   Advance directive- brother Audrey Turner designated if patient were incapacitated.   Cognitive function addressed- see scanned forms- and if abnormal then additional documentation follows.  [facility].

## 2015-12-16 ENCOUNTER — Ambulatory Visit: Payer: Medicare HMO | Admitting: Physical Therapy

## 2015-12-20 ENCOUNTER — Ambulatory Visit: Payer: Medicare HMO | Admitting: Physical Therapy

## 2015-12-23 ENCOUNTER — Telehealth: Payer: Self-pay | Admitting: Family Medicine

## 2015-12-23 NOTE — Telephone Encounter (Signed)
Thanks

## 2015-12-23 NOTE — Telephone Encounter (Signed)
Received a call from [place]. She received a call from patients brother after I called him yesterday to tell Otila Kluver that the patient could not afford to go the [facility] because her insurance doesn't cover any of it. Patient asked [place] to cancel this referral. I will cancel this.

## 2015-12-27 ENCOUNTER — Ambulatory Visit: Payer: Medicare HMO | Admitting: Physical Therapy

## 2015-12-29 ENCOUNTER — Ambulatory Visit: Payer: Medicare HMO | Admitting: Physical Therapy

## 2016-01-03 NOTE — Progress Notes (Signed)
Audrey Turner was seen today in neurologic consultation at the request of [name], MD.  Pt is accompanied by her brother who supplements the history.  The consultation is for the evaluation of new-onset seizure.  I have reviewed prior records made available to me.  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  Her first resection was in October, 2007, followed by a repeat in August, 2008.  She has been followed by [facility] for this.  I did review the notes made available to me.  The patient was also being seen at [facility] by psychotherapy for frontal lobe syndrome/cognitive dysfunction and has been on Ritalin, 15 mg twice a day.  She reports that she was in her normal state of health on 12/09/2015 when she and her brother went to the mattress store (had done PT before that).   Upon exiting her vehicle her brother noted she suddenly stopped and could not move this was followed by right arm jerking.   Her brother states that her left arm shook some but not like the right. She was helped to the ground.  Her eyes rolled back and lasted about 3 minutes.  EMS was contacted and once shaking stopped and waking up, she had trouble moving the right arm.  By the time she got to the hospital, the R arm was stronger and back to baseline.   . Patient has no history of seizure.  She was started on Keppra, 500 mg twice a day.  They were told to go up to 1000mg  bid after a week but they didn't do that.  Brother thinks that overall seems to have clinically deteriorated.  Since her seizure, she has not followed up with PT.  Brother reports that she also does not follow PT advice.  Went from being able to walk to mostly WC bound.  Pt alone during the day but brother lives with her at night.  Brother states that she does nothing but watch TV during the day.  Seems to get confused/dazed by 8pm.  Has EDS.  Hx of OSAS but refused CPAP years ago.  Willing to reconsider.  Brother also c/o day/night  reversal.   Neuroimaging has  previously been performed.  The last MRI of the brain that I can find a report of was done at [facility] in 2015.  Brother states that it was done more recently at [facility] (last December).   The 2015 exam was reported to just show postop changes in the left frontal region and white matter disease.  She did have a CT of the brain when she came to the hospital on 12/09/2015.  This again just demonstrated postoperative changes in the left frontal region.  It was nonacute.  I reviewed this.  Per [facility] oncology notes, MRI from 03/23/15 was compared to MRI from 02/12/12 and was stable.     Treatment History via review of [facility] records:  01/10/06: Craniotomy and debulking performed at outside hospital revealing anaplastic astrocytoma. This was followed by Temozolomide and radiation and then six months of Temozolomide with last cycle of Temozolomide initiated on 08/10/06.  09/30/06: Radiographic evidence of new rim enhancing lesion butting off of the original resection cavity consistent with progressive disease.  10/15/06: New patient evaluation at [facility] at [place]. 10/17/06: Craniotomy with resection of tumor with Dr. Derrill Memo, and pathology confirming anaplastic astrocytoma.  11/19/06: Patient initiated on Zactima, Imatinib and Hydroxyurea per protocol.  01/20/07: Patient has completed two cycles on protocol utilizing Zactima, Gleevec, and Hydrea.  02/24/07: Patient is now status post three cycles of Zactima/Gleevec/Hydrea on protocol. She has had a dose reduction for cycle 3 with Zactima at 100 mg p.o. DAILY and Gleevec 300 mg p.o. DAILY due to problems with diarrhea.  03/24/07: Patient is status post four cycles of therapy on protocol with Zactima, Gleevec and Hydrea.  05/19/07: Patient is status post five cycles of Zactima, Gleevec, and Hydroxyurea on protocol; last dose on 04/18/07 and has been off therapy since that time secondary to  requiring surgery on her craniotomy site. We will await recommendations from Dr. Beverely Low in Plastic Surgery prior to continuing with therapy. She is clinically and radiographically stable at this time.  09/22/07: Patient is status post four cycles of VP-16 x 21 days per 28-day cycle.  05/26/08: Status post twelve cycles of Etoposide; off therapy.    PREVIOUS MEDICATIONS: n/a  ALLERGIES:   Allergies  Allergen Reactions  . Penicillins     REACTION: RASH  . Phenytoin     REACTION: for seizures, gave her a rash-(dilantin)  . Dilantin [Phenytoin Sodium Extended] Rash  . Penicillins Rash    CURRENT MEDICATIONS:  Outpatient Encounter Prescriptions as of 01/05/2016  Medication Sig  . acetaminophen (TYLENOL) 500 MG tablet Take 500 mg by mouth as needed.  . benzocaine-resorcinol (VAGISIL) 5-2 % vaginal cream as needed as needed  . levETIRAcetam (KEPPRA) 500 MG tablet 500 mg twice a day x7 days, then increase to 1000 mg orally twice a day. (Patient taking differently: 500 mg 2 (two) times daily. )  . loratadine-pseudoephedrine (CLARITIN-D 24 HOUR) 10-240 MG per 24 hr tablet Take 10-240 mg by mouth as needed.  . methylphenidate (RITALIN) 10 MG tablet Take 10 mg by mouth. 2 in the morning, 1 at night  . mirabegron ER (MYRBETRIQ) 50 MG TB24 tablet Take 50 mg by mouth daily.  . Multiple Vitamin (MULTIVITAMIN) capsule Take 1 capsule by mouth daily.    Marland Kitchen oxybutynin (DITROPAN XL) 15 MG 24 hr tablet Take 15 mg by mouth at bedtime.  . sertraline (ZOLOFT) 100 MG tablet Take 2 tabs po qday  . [DISCONTINUED] sertraline (ZOLOFT) 50 MG tablet Take 50 mg by mouth once.   No facility-administered encounter medications on file as of 01/05/2016.     PAST MEDICAL HISTORY:   Past Medical History:  Diagnosis Date  . Acute sinusitis, unspecified   . Astrocytoma [facility]) 01/2006 /09/2006   grade 3 atrocytoma s/p chemo/radiation/surgery at [place], in remission since at least 2011  . Cancer ([place])    Brain   .  Diarrhea   . Drug induced neutropenia(288.03)   . Primary exertional headache   . Recurrent depression ([place])   . Vaginitis     PAST SURGICAL HISTORY:   Past Surgical History:  Procedure Laterality Date  . [facility].  . d&c miscarriage  1993  . left frontal craniotomy w/ left subtotal resection of left frontal brain tumor  01/10/2006  . [facility] brain tumor surgery  01/13/2006  . NSVD x 3 - premie at 22 wks    . resection residual tumor  10/08/2006   Dr. Tommi Rumps: [facility]  . skull surg  2009/2010/2012   3 times past brain surg to close up opening    SOCIAL HISTORY:   Social History   Social History  . Marital status: Married    Spouse name: N/A  . Number  of children: 3  . 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 Smoking status: Never Smoker  . Smokeless tobacco: Never Used  . Alcohol use No  . Drug use: No  . Sexual activity: No   Other Topics Concern  . Not on file   Social History Narrative   Divorced   From [place], [place]    FAMILY HISTORY:   Family Status  Relation Status  . Mother Alive  . Father Deceased  . Paternal Advertising account executive  . Paternal Aunt Deceased  . Brother Alive  . Brother Alive  . Brother Alive  . Maternal Aunt   . Neg Hx     ROS:  Has urinary incontinence.  If goes to bathroom every 2 hours, does better.  She sees urology.  A complete 10 system review of systems was obtained and was unremarkable apart from what is mentioned above.  PHYSICAL EXAMINATION:    VITALS:   Vitals:   01/05/16 1321  BP: 120/88  Pulse: 70  Resp: (!) 96    GEN:  Normal appears female in no acute distress.  Appears stated age. HEENT:  Normocephalic, atraumatic. The mucous membranes are moist. The superficial temporal arteries are without ropiness or tenderness. Cardiovascular: Regular rate and rhythm. Lungs: Clear to auscultation bilaterally. Neck/Heme: There are no carotid bruits  noted bilaterally.  NEUROLOGICAL: Orientation:  The patient is alert and oriented x 3.  She is however slow to respond and requires help from her brother regarding finer details of the history.   Cranial nerves: There is good facial symmetry. The pupils are equal round and reactive to light bilaterally. Fundoscopic exam reveals clear disc margins bilaterally. Extraocular muscles are intact with mild upgaze paresis and visual fields are full to confrontational testing. Speech is fluent and clear but lacks spontaneity of speech. Soft palate rises symmetrically and there is no tongue deviation. Hearing is intact to conversational tone. Tone: Tone is good throughout. Sensation: Sensation is intact to light touch and pinprick throughout (facial, trunk, extremities). Vibration is intact at the bilateral big toe but slightly decreased. There is no extinction with double simultaneous stimulation. There is no sensory dermatomal level identified. Coordination:  The patient has no difficulty with RAM's or FNF bilaterally. Motor: Strength is 5/5 in the bilateral upper and lower extremities.  Shoulder shrug is equal and symmetric. There is no pronator drift.  There are no fasciculations noted. DTR's: Deep tendon reflexes are 3/4 at the bilateral biceps, triceps, brachioradialis, patella and achilles.  Plantar responses are downgoing bilaterally. Gait and Station: The patient requires assistance to get out of her wheelchair.  She was given a walker.  She is very slow and wide based.   IMPRESSION/PLAN  1.  Probable seizure  -Talked to the patient about her risk factors for seizure.  Her prior history of several craniotomies for astrocytoma certainly increases risk for seizure.  In addition, she is taking Ritalin.  I explained to the patient that this medication can lower seizure threshold.  If she absolutely needs the medication (sounds like she may), then this can be continued while monitoring closely for any  future seizures.  Will let [facility] physicians know about the event since they are prescribing Ritalin.  Offered to transfer this care to [facility] so all consolidated but too hard for her brother to keep taking off of work to get pt there.  We talked about  seizure and safety extensively.  This included but was not limited to no driving for at least 6 months (she doesn't drive), no working with heavy or dangerous equipment, no swimming alone, no working at heights, no tub baths alone.  -recommend EEG.   If negative, will schedule ambulatory  -recommend repeat MRI brain with and without  -recommend life alert systems and information given about those  -will contact [facility] neurooncolgy to let them know about above seizure   2.  OSAS  -last PSG several years ago and brother is very concerned about EDS.  Brother states that pt had OSAS identified a few years ago but refused CPAP.  Will order home study.  3.  Frontal lobe syndrome with cognitive impairment  -talked about regular set schedule daily as brother states that more confused and having some day night reversal.  -seeing physicians and psychotherapy at [facility] in this regard  4.  F/u after above completed.  Much greater than 50% of this visit was spent in counseling and coordinating care.  Total face to face time:  60 min   Cc:  [name], MD

## 2016-01-04 ENCOUNTER — Ambulatory Visit: Payer: Medicare HMO | Admitting: Physical Therapy

## 2016-01-05 ENCOUNTER — Encounter: Payer: Self-pay | Admitting: Neurology

## 2016-01-05 ENCOUNTER — Ambulatory Visit (INDEPENDENT_AMBULATORY_CARE_PROVIDER_SITE_OTHER): Payer: Medicare HMO | Admitting: Neurology

## 2016-01-05 VITALS — BP 120/88 | HR 70 | Resp 96

## 2016-01-05 DIAGNOSIS — R569 Unspecified convulsions: Secondary | ICD-10-CM

## 2016-01-05 DIAGNOSIS — Z85841 Personal history of malignant neoplasm of brain: Secondary | ICD-10-CM | POA: Diagnosis not present

## 2016-01-05 DIAGNOSIS — G4710 Hypersomnia, unspecified: Secondary | ICD-10-CM

## 2016-01-05 DIAGNOSIS — G4730 Sleep apnea, unspecified: Secondary | ICD-10-CM

## 2016-01-05 NOTE — Patient Instructions (Addendum)
1. We have sent a referral to [facility] for your MRI and they will call you directly to schedule your appt. They are located at [place]. If you need to contact them directly please call [phone].  2. We will schedule you for EEG in our office. If negative we will call you to schedule a 24 hour EEG.   3. We have sent a referral to [facility] for a home sleep study. They will contact you to schedule. They can be reached directly at [phone].  Life Alert Resources  Medical Alert Systems that can locate patients outside the home:  http://mobilealertsystems.com/ 239-215-4647 http://www.lifelinesys.com/content/lifeline-products/get-life-gosafe  980 666 6145 www.verizonwireless.com/sure/ (424)625-2935  Medial Alert systems that link to smart phones:  http://www.lifelinesys.com/content/lifeline-products/response-app https://www.stanton.info/ RecycleRoad.pl.aspx  Alzheimer's Association GPS Tracker:  VoiceZap.is 5861259674

## 2016-01-06 ENCOUNTER — Encounter: Payer: Self-pay | Admitting: Neurology

## 2016-01-16 ENCOUNTER — Other Ambulatory Visit: Payer: Self-pay | Admitting: Neurology

## 2016-01-16 ENCOUNTER — Other Ambulatory Visit: Payer: Self-pay

## 2016-01-16 DIAGNOSIS — G4730 Sleep apnea, unspecified: Principal | ICD-10-CM

## 2016-01-16 DIAGNOSIS — G4710 Hypersomnia, unspecified: Secondary | ICD-10-CM

## 2016-01-23 ENCOUNTER — Ambulatory Visit (INDEPENDENT_AMBULATORY_CARE_PROVIDER_SITE_OTHER): Payer: Medicare HMO | Admitting: Neurology

## 2016-01-23 ENCOUNTER — Telehealth: Payer: Self-pay

## 2016-01-23 DIAGNOSIS — R569 Unspecified convulsions: Secondary | ICD-10-CM | POA: Diagnosis not present

## 2016-01-24 ENCOUNTER — Telehealth: Payer: Self-pay | Admitting: Neurology

## 2016-01-24 NOTE — Telephone Encounter (Signed)
-----   Message from [place], DO sent at 01/24/2016  8:01 AM EDT ----- Let pt know that EEG didn't show any seizures and will recommend proceed to ambulatory to make sure not missing anything

## 2016-01-24 NOTE — Procedures (Signed)
TECHNICAL SUMMARY:  A multichannel referential and bipolar montage EEG using the standard international 10-20 system was performed on the patient described as awake, drowsy and asleep.  The dominant background activity consists of 8.5 hertz activity seen most prominantly over the posterior head region.  The backgound activity is minimally reactive to eye opening and closing procedures.  There is decreased beta activity and higher amplitudes noted in the left hemisphere compared to that of the right.  This is consistent with a breach rhythm.  ACTIVATION:  Stepwise photic stimulation at 4-20 flashes per second was performed and did not elicit any abnormal waveforms.  Hyperventilation was not performed.  EPILEPTIFORM ACTIVITY:  There were no spikes, sharp waves or paroxysmal activity.  SLEEP:  Stage I sleep was noted, but no stage II sleep.   IMPRESSION:  This EEG demonstrated decreased beta activity and higher amplitudes in the left hemisphere compared to that of the right, consistent with a breach rhythm.  This is generally seen with a history of prior craniotomy defect.  There was no epileptiform activity recorded on this EEG.  This does not rule out seizure disorder and if seizure remains high on the list of differential diagnosis, an ambulatory EEG may be of value.  Correlate clinically.

## 2016-01-24 NOTE — Telephone Encounter (Signed)
Patient's brother made aware. Message sent to Manuela Schwartz to schedule AMB EEG.

## 2016-01-26 ENCOUNTER — Ambulatory Visit
Admission: RE | Admit: 2016-01-26 | Discharge: 2016-01-26 | Disposition: A | Discharge status: Home / Self Care | Payer: Medicare HMO | Source: Ambulatory Visit | Attending: Neurology | Admitting: Neurology

## 2016-01-26 DIAGNOSIS — Z85841 Personal history of malignant neoplasm of brain: Secondary | ICD-10-CM

## 2016-01-26 DIAGNOSIS — R569 Unspecified convulsions: Secondary | ICD-10-CM

## 2016-01-26 MED ORDER — GADOBENATE DIMEGLUMINE 529 MG/ML IV SOLN
15.0000 mL | Freq: Once | INTRAVENOUS | Status: AC | PRN
  Administered 2016-01-26: 15 mL via INTRAVENOUS

## 2016-01-27 ENCOUNTER — Telehealth: Payer: Self-pay | Admitting: Neurology

## 2016-01-27 NOTE — Telephone Encounter (Signed)
-----   Message from [place], DO sent at 01/27/2016  7:37 AM EDT ----- Let pt know that MRI didn't show anything new

## 2016-01-27 NOTE — Telephone Encounter (Signed)
Patient's brother made aware.

## 2016-02-01 ENCOUNTER — Ambulatory Visit (INDEPENDENT_AMBULATORY_CARE_PROVIDER_SITE_OTHER): Payer: Medicare HMO | Admitting: Neurology

## 2016-02-01 DIAGNOSIS — R569 Unspecified convulsions: Secondary | ICD-10-CM

## 2016-02-09 ENCOUNTER — Telehealth: Payer: Self-pay | Admitting: Neurology

## 2016-02-09 NOTE — Telephone Encounter (Signed)
Patient made aware.

## 2016-02-09 NOTE — Telephone Encounter (Signed)
-----   Message from [place], DO sent at 02/09/2016  1:43 PM EDT ----- Let pt/family know that no evidence of seizure on her long EEG.  I see she has appt with Dr. Ferdinand Lango at [facility] in few weeks so that will be good for letting know if any changes.  Can let us know if wants to continue to f/u here

## 2016-02-09 NOTE — Procedures (Signed)
ELECTROENCEPHALOGRAM REPORT  Dates of Recording: 02/01/2016 to 02/02/2016  Patient's Name: Audrey Turner MRN: [id number] Date of Birth: 1961/03/14  Referring Provider: Dr. Wells Guiles Tat  Procedure: 24-hour ambulatory EEG  History: This is a 55 year old woman with a history of left frontal astrocytoma s/p resection with new onset seizure.  Medications: Keppra, Ritalin, Tylenol, Myrbetriq, Claritin  Technical Summary: This is a 24-hour multichannel digital EEG recording measured by the international 10-20 system with electrodes applied with paste and impedances below 5000 ohms performed as portable with EKG monitoring.  The digital EEG was referentially recorded, reformatted, and digitally filtered in a variety of bipolar and referential montages for optimal display.    DESCRIPTION OF RECORDING: During maximal wakefulness, the background activity consisted of an asymmetric 8-8.5 Hz posterior dominant rhythm better formed over the right occipital region. The background is asymmetric with higher voltage polymorphic theta and delta slowing over the left hemisphere, maximal over the left frontotemporal region. Breach artifact is seen with higher voltage and sharply contoured waveforms in this region. There were no clear epileptiform discharges seen in wakefulness.  During the recording, the patient progresses through wakefulness, drowsiness, and Stage 2 sleep. Similar focal slowing is seen over the left frontotemporal region, although more pronounced when patient is awake. Again, there were no epileptiform discharges seen.  Events: On 10/25 at 1940 hours, patient reports disorientation (movement slowed). Electrographically, there were no EEG or EKG changes from baseline seen.  On 10/26 at 0950 hours, patient reports disorientation/fall. Electrographically, there were no EEG or EKG changes from baseline seen.  There were no electrographic seizures seen.  EKG lead was  unremarkable.  IMPRESSION: This 24-hour ambulatory EEG study is abnormal due to the presence of: 1. Focal slowing over the left hemisphere, maximal over the left frontotemporal region 2. Breach artifact over the left frontotemporal region  CLINICAL CORRELATION of the above findings indicates focal cerebral dysfunction over the left hemisphere suggestive of underlying structural or physiologic abnormality. Breach artifact is consistent with patient's history of surgery in this region. The absence of epileptiform discharges does not exclude a clinical diagnosis of epilepsy.  If further clinical questions remain, inpatient video EEG monitoring may be helpful.   Ellouise Newer, M.D.

## 2016-02-13 ENCOUNTER — Telehealth: Payer: Self-pay | Admitting: Neurology

## 2016-02-13 MED ORDER — LEVETIRACETAM 500 MG PO TABS: 500.0000 mg | ORAL_TABLET | Freq: Two times a day (BID) | ORAL | 2 refills | Status: DC

## 2016-02-13 MED ORDER — LEVETIRACETAM 1000 MG PO TABS
1000.0000 mg | ORAL_TABLET | Freq: Two times a day (BID) | ORAL | 2 refills | Status: DC
Start: 2016-02-13 — End: 2016-02-13

## 2016-02-13 NOTE — Telephone Encounter (Signed)
Yes.  Will they be coming here in future?

## 2016-02-13 NOTE — Telephone Encounter (Signed)
Audrey Turner [birth year]. She is out of her Keppra medication. She uses  [facility]. Her brother called # is [phone]. He said he did go on Friday to try and pick it up and it was not there. Thank you

## 2016-02-13 NOTE — Telephone Encounter (Signed)
Would we prescribe this?

## 2016-02-13 NOTE — Telephone Encounter (Signed)
Spoke with brother. He does state she will follow up here for this issue. Keppra refill sent to [facility]. She is only taking 500 mg BID. He will make follow up appt in the next few months.

## 2016-02-13 NOTE — Telephone Encounter (Signed)
Ask brother but I am pretty sure that brother said that they couldn't keep travelling but I do see she has upcoming appt with Towanda Octave at [place], which I would love her to keep

## 2016-02-13 NOTE — Telephone Encounter (Signed)
Bea with [facility] pharmacy left a message regarding PT and a prescription for Keppra/Dawn CB# [phone]

## 2016-02-13 NOTE — Telephone Encounter (Signed)
Confirmed Keppra 500 mg BID with pharmacy.

## 2016-02-13 NOTE — Telephone Encounter (Signed)
It seemed from my conversation with brother that they were going to just follow with [facility]. Send anyway?

## 2016-02-26 ENCOUNTER — Ambulatory Visit (HOSPITAL_BASED_OUTPATIENT_CLINIC_OR_DEPARTMENT_OTHER): Payer: Medicare HMO | Attending: Neurology | Admitting: Internal Medicine

## 2016-02-26 DIAGNOSIS — G4710 Hypersomnia, unspecified: Secondary | ICD-10-CM

## 2016-02-26 DIAGNOSIS — G4733 Obstructive sleep apnea (adult) (pediatric): Secondary | ICD-10-CM | POA: Insufficient documentation

## 2016-02-26 DIAGNOSIS — G4730 Sleep apnea, unspecified: Secondary | ICD-10-CM

## 2016-03-04 DIAGNOSIS — G4730 Sleep apnea, unspecified: Secondary | ICD-10-CM

## 2016-03-04 DIAGNOSIS — G4710 Hypersomnia, unspecified: Secondary | ICD-10-CM

## 2016-03-04 NOTE — Progress Notes (Signed)
Let pt/brother know that O2 significantly drops at night but not necessarily due to sleep apnea.  May need noctural O2 but will ask for sleep consult to assess that (are they already seeing her or just did study?)

## 2016-03-04 NOTE — Procedures (Signed)
  Patient Name: Audrey Turner, Gentil Study Date: 02/26/2016 Gender: Female D.O.B: [birth year] Age (years): 79 Referring Provider: Wells Guiles Tat Height (inches): 73 Interpreting Physician: Baird Lyons MD, ABSM Weight (lbs): 168 RPSGT: Madelon Lips BMI: 34 MRN: [id number] Neck Size: 16.50 CLINICAL INFORMATION Sleep Study Type: NPSG Indication for sleep study: Excessive Daytime Sleepiness, OSA Epworth Sleepiness Score: 6  SLEEP STUDY TECHNIQUE As per the AASM Manual for the Scoring of Sleep and Associated Events v2.3 (April 2016) with a hypopnea requiring 4% desaturations. The channels recorded and monitored were frontal, central and occipital EEG, electrooculogram (EOG), submentalis EMG (chin), nasal and oral airflow, thoracic and abdominal wall motion, anterior tibialis EMG, snore microphone, electrocardiogram, and pulse oximetry.  MEDICATIONS Medications self-administered by patient taken the night of the study : KEPPRA, MYRBETRIQ, OXYBUTYNIN  SLEEP ARCHITECTURE The study was initiated at 11:05:57 PM and ended at 5:11:19 AM. Sleep onset time was 79.5 minutes and the sleep efficiency was 26.7%. The total sleep time was 97.5 minutes. Stage REM latency was 119.5 minutes. The patient spent 42.05% of the night in stage N1 sleep, 54.36% in stage N2 sleep, 2.05% in stage N3 and 1.54% in REM. Alpha intrusion questioned. Supine sleep was 60.02%.  RESPIRATORY PARAMETERS The overall apnea/hypopnea index (AHI) was 3.7 per hour. There were 0 total apneas, including 0 obstructive, 0 central and 0 mixed apneas. There were 6 hypopneas and 3 RERAs. The AHI during Stage REM sleep was 0.0 per hour. AHI while supine was 6.2 per hour. The mean oxygen saturation was 93.39%. The minimum SpO2 during sleep was 65.00%. Moderate snoring was noted during this study.  CARDIAC DATA The 2 lead EKG demonstrated sinus rhythm. The mean heart rate was 65.09 beats per minute. Other EKG findings include:  PVCs.  LEG MOVEMENT DATA The total PLMS were 0 with a resulting PLMS index of 0.00. Associated arousal with leg movement index was 0.0 .  IMPRESSIONS - No significant obstructive sleep apnea occurred during this study (AHI = 3.7/h). - No significant central sleep apnea occurred during this study (CAI = 0.0/h). - Severe oxygen desaturation was noted during this study (Min O2 = 65.00%, Mean 93.9%). - The patient snored with Moderate snoring volume. - EKG findings include PVCs. - Clinically significant periodic limb movements did not occur during sleep. No significant associated arousals. - Abnormal EEG with sustained  high frequency sharp activity all leads- interpretation deferred to Neurology given medical history.  DIAGNOSIS - Nocturnal Hypoxemia (327.26 [G47.36 ICD-10]) - Primary Snoring (786.09 [R06.83 ICD-10]) - Abnormal EEG  RECOMMENDATIONS - Return to Neurologist to discuss results - Avoid alcohol, sedatives and other CNS depressants that may worsen sleep apnea and disrupt normal sleep architecture. - Sleep hygiene should be reviewed to assess factors that may improve sleep quality. - Weight management and regular exercise should be initiated or continued if appropriate.  [Electronically signed] 03/04/2016 03:48 PM  Baird Lyons MD, [place], American Board of Sleep Medicine   NPI: [id number] [place], [place] of Sleep Medicine  ELECTRONICALLY SIGNED ON:  03/04/2016, 3:50 PM [place] PH: [phone]   FX: [phone] [place]

## 2016-03-06 ENCOUNTER — Telehealth: Payer: Self-pay | Admitting: Neurology

## 2016-03-06 NOTE — Telephone Encounter (Signed)
-----   Message from [place], DO sent at 03/04/2016  4:48 PM EST -----   ----- Message ----- From: Deneise Lever, MD Sent: 03/04/2016   3:52 PM To: Eustace Quail Tat, DO

## 2016-03-06 NOTE — Telephone Encounter (Signed)
Follow up appt made to discuss sleep study results.

## 2016-03-15 NOTE — Progress Notes (Deleted)
Audrey Turner was seen today in neurologic consultation at the request of [name], MD.  Pt is accompanied by her brother who supplements the history.  The consultation is for the evaluation of new-onset seizure.  I have reviewed prior records made available to me.  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  Her first resection was in October, 2007, followed by a repeat in August, 2008.  She has been followed by [facility] for this.  I did review the notes made available to me.  The patient was also being seen at [facility] by psychotherapy for frontal lobe syndrome/cognitive dysfunction and has been on Ritalin, 15 mg twice a day.  She reports that she was in her normal state of health on 12/09/2015 when she and her brother went to the mattress store (had done PT before that).   Upon exiting her vehicle her brother noted she suddenly stopped and could not move this was followed by right arm jerking.   Her brother states that her left arm shook some but not like the right. She was helped to the ground.  Her eyes rolled back and lasted about 3 minutes.  EMS was contacted and once shaking stopped and waking up, she had trouble moving the right arm.  By the time she got to the hospital, the R arm was stronger and back to baseline.   . Patient has no history of seizure.  She was started on Keppra, 500 mg twice a day.  They were told to go up to 1000mg  bid after a week but they didn't do that.  Brother thinks that overall seems to have clinically deteriorated.  Since her seizure, she has not followed up with PT.  Brother reports that she also does not follow PT advice.  Went from being able to walk to mostly WC bound.  Pt alone during the day but brother lives with her at night.  Brother states that she does nothing but watch TV during the day.  Seems to get confused/dazed by 8pm.  Has EDS.  Hx of OSAS but refused CPAP years ago.  Willing to reconsider.  Brother also c/o day/night  reversal.   Neuroimaging has  previously been performed.  The last MRI of the brain that I can find a report of was done at [facility] in 2015.  Brother states that it was done more recently at [facility] (last December).   The 2015 exam was reported to just show postop changes in the left frontal region and white matter disease.  She did have a CT of the brain when she came to the hospital on 12/09/2015.  This again just demonstrated postoperative changes in the left frontal region.  It was nonacute.  I reviewed this.  Per [facility] oncology notes, MRI from 03/23/15 was compared to MRI from 02/12/12 and was stable.     Treatment History via review of [facility] records:  01/10/06: Craniotomy and debulking performed at outside hospital revealing anaplastic astrocytoma. This was followed by Temozolomide and radiation and then six months of Temozolomide with last cycle of Temozolomide initiated on 08/10/06.  09/30/06: Radiographic evidence of new rim enhancing lesion butting off of the original resection cavity consistent with progressive disease.  10/15/06: New patient evaluation at [facility] [place]. 10/17/06: Craniotomy with resection of tumor with Dr. Derrill Memo, and pathology confirming anaplastic astrocytoma.  11/19/06: Patient initiated on Zactima, Imatinib and Hydroxyurea per protocol.  01/20/07: Patient has completed two cycles on protocol utilizing Zactima, Gleevec, and Hydrea.  02/24/07: Patient is now status post three cycles of Zactima/Gleevec/Hydrea on protocol. She has had a dose reduction for cycle 3 with Zactima at 100 mg p.o. DAILY and Gleevec 300 mg p.o. DAILY due to problems with diarrhea.  03/24/07: Patient is status post four cycles of therapy on protocol with Zactima, Gleevec and Hydrea.  05/19/07: Patient is status post five cycles of Zactima, Gleevec, and Hydroxyurea on protocol; last dose on 04/18/07 and has been off therapy since that time secondary to  requiring surgery on her craniotomy site. We will await recommendations from Dr. Beverely Low in Plastic Surgery prior to continuing with therapy. She is clinically and radiographically stable at this time.  09/22/07: Patient is status post four cycles of VP-16 x 21 days per 28-day cycle.  05/26/08: Status post twelve cycles of Etoposide; off therapy.   03/16/16 update:  The patient follows up today, accompanied by her brother who supplements the history.  The patient had a routine and ambulatory EEG since our last visit that was unremarkable with the exception of breach rhythm.  The patient had a nocturnal polysomnogram since our last visit.  There was no OSAS but there was significant O2 desat to 65%.  MRI brain since last visit did not demonstrate any recurrence of tumor.  I reviewed that.   PREVIOUS MEDICATIONS: n/a  ALLERGIES:   Allergies  Allergen Reactions  . Penicillins     REACTION: RASH  . Phenytoin     REACTION: for seizures, gave her a rash-(dilantin)  . Dilantin [Phenytoin Sodium Extended] Rash  . Penicillins Rash    CURRENT MEDICATIONS:  Outpatient Encounter Prescriptions as of 03/16/2016  Medication Sig  . acetaminophen (TYLENOL) 500 MG tablet Take 500 mg by mouth as needed.  . benzocaine-resorcinol (VAGISIL) 5-2 % vaginal cream as needed as needed  . levETIRAcetam (KEPPRA) 500 MG tablet Take 1 tablet (500 mg total) by mouth 2 (two) times daily.  Marland Kitchen loratadine-pseudoephedrine (CLARITIN-D 24 HOUR) 10-240 MG per 24 hr tablet Take 10-240 mg by mouth as needed.  . methylphenidate (RITALIN) 10 MG tablet Take 10 mg by mouth. 2 in the morning, 1 at night  . mirabegron ER (MYRBETRIQ) 50 MG TB24 tablet Take 50 mg by mouth daily.  . Multiple Vitamin (MULTIVITAMIN) capsule Take 1 capsule by mouth daily.    Marland Kitchen oxybutynin (DITROPAN XL) 15 MG 24 hr tablet Take 15 mg by mouth at bedtime.  . sertraline (ZOLOFT) 100 MG tablet Take 2 tabs po qday   No facility-administered encounter medications  on file as of 03/16/2016.     PAST MEDICAL HISTORY:   Past Medical History:  Diagnosis Date  . Acute sinusitis, unspecified   . Astrocytoma [facility]) 01/2006 /09/2006   grade 3 atrocytoma s/p chemo/radiation/surgery at [place], in remission since at least 2011  . Cancer ([place])    Brain   . Diarrhea   . Drug induced neutropenia(288.03)   . Primary exertional headache   . Recurrent depression ([place])   . Vaginitis     PAST SURGICAL HISTORY:   Past Surgical History:  Procedure Laterality Date  . [facility].  . d&c miscarriage  1993  . left frontal craniotomy w/ left subtotal resection of left frontal brain tumor  01/10/2006  . [facility] brain tumor surgery  01/13/2006  . NSVD x 3 - premie at 22 wks    . resection residual  tumor  10/08/2006   Dr. Tommi Rumps: [facility]  . skull surg  2009/2010/2012   3 times past brain surg to close up opening    SOCIAL HISTORY:   Social History   Social History  . Marital status: Married    Spouse name: N/A  . Number of children: 3  . 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 Smoking status: Never Smoker  . Smokeless tobacco: Never Used  . Alcohol use No  . Drug use: No  . Sexual activity: No   Other Topics Concern  . Not on file   Social History Narrative   Divorced   From [place], [place]    FAMILY HISTORY:   Family Status  Relation Status  . Mother Alive  . Father Deceased  . Paternal Advertising account executive  . Paternal Aunt Deceased  . Brother Alive  . Brother Alive  . Brother Alive  . Maternal Aunt   . Neg Hx     ROS:  Has urinary incontinence.  If goes to bathroom every 2 hours, does better.  She sees urology.  A complete 10 system review of systems was obtained and was unremarkable apart from what is mentioned above.  PHYSICAL EXAMINATION:    VITALS:   There were no vitals filed for this visit.  GEN:  Normal appears female in no acute distress.  Appears stated  age. HEENT:  Normocephalic, atraumatic. The mucous membranes are moist. The superficial temporal arteries are without ropiness or tenderness. Cardiovascular: Regular rate and rhythm. Lungs: Clear to auscultation bilaterally. Neck/Heme: There are no carotid bruits noted bilaterally.  NEUROLOGICAL: Orientation:  The patient is alert and oriented x 3.  She is however slow to respond and requires help from her brother regarding finer details of the history.   Cranial nerves: There is good facial symmetry. The pupils are equal round and reactive to light bilaterally. Fundoscopic exam reveals clear disc margins bilaterally. Extraocular muscles are intact with mild upgaze paresis and visual fields are full to confrontational testing. Speech is fluent and clear but lacks spontaneity of speech. Soft palate rises symmetrically and there is no tongue deviation. Hearing is intact to conversational tone. Tone: Tone is good throughout. Sensation: Sensation is intact to light touch and pinprick throughout (facial, trunk, extremities). Vibration is intact at the bilateral big toe but slightly decreased. There is no extinction with double simultaneous stimulation. There is no sensory dermatomal level identified. Coordination:  The patient has no difficulty with RAM's or FNF bilaterally. Motor: Strength is 5/5 in the bilateral upper and lower extremities.  Shoulder shrug is equal and symmetric. There is no pronator drift.  There are no fasciculations noted. DTR's: Deep tendon reflexes are 3/4 at the bilateral biceps, triceps, brachioradialis, patella and achilles.  Plantar responses are downgoing bilaterally. Gait and Station: The patient requires assistance to get out of her wheelchair.  She was given a walker.  She is very slow and wide based.   IMPRESSION/PLAN  1.  Probable seizure  -Talked to the patient about her risk factors for seizure.  Her prior history of several craniotomies for astrocytoma certainly  increases risk for seizure.  In addition, she is taking Ritalin.  I explained to the patient that this medication can lower seizure threshold.  If she absolutely needs the medication (sounds like she may), then this can be continued while monitoring closely for any future seizures.  Will  let [facility] physicians know about the event since they are prescribing Ritalin.  Offered to transfer this care to [facility] so all consolidated but too hard for her brother to keep taking off of work to get pt there.  We talked about seizure and safety extensively.  This included but was not limited to no driving for at least 6 months (she doesn't drive), no working with heavy or dangerous equipment, no swimming alone, no working at heights, no tub baths alone.  -recommend life alert systems and information given about those   2.  Nocturnal hypoxemia  -O2 desat to 65% on PSG but no OSAS.  Likely needs nocturnal O2.  Refer to sleep med for management  3.  Frontal lobe syndrome with cognitive impairment  -talked about regular set schedule daily as brother states that more confused and having some day night reversal.  -seeing physicians and psychotherapy at [facility] in this regard  4.  F/u after above completed.  Much greater than 50% of this visit was spent in counseling and coordinating care.  Total face to face time:  60 min   Cc:  [name], MD

## 2016-03-16 ENCOUNTER — Ambulatory Visit: Payer: Self-pay | Admitting: Neurology

## 2016-03-20 ENCOUNTER — Encounter: Payer: Self-pay | Admitting: Neurology

## 2016-04-04 NOTE — Telephone Encounter (Signed)
Error

## 2016-04-16 NOTE — Progress Notes (Deleted)
Audrey Turner was seen today in neurologic consultation at the request of [name], MD.  Pt is accompanied by her brother who supplements the history.  The consultation is for the evaluation of new-onset seizure.  I have reviewed prior records made available to me.  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  Her first resection was in October, 2007, followed by a repeat in August, 2008.  She has been followed by [facility] for this.  I did review the notes made available to me.  The patient was also being seen at [facility] by psychotherapy for frontal lobe syndrome/cognitive dysfunction and has been on Ritalin, 15 mg twice a day.  She reports that she was in her normal state of health on 12/09/2015 when she and her brother went to the mattress store (had done PT before that).   Upon exiting her vehicle her brother noted she suddenly stopped and could not move this was followed by right arm jerking.   Her brother states that her left arm shook some but not like the right. She was helped to the ground.  Her eyes rolled back and lasted about 3 minutes.  EMS was contacted and once shaking stopped and waking up, she had trouble moving the right arm.  By the time she got to the hospital, the R arm was stronger and back to baseline.   . Patient has no history of seizure.  She was started on Keppra, 500 mg twice a day.  They were told to go up to 1000mg  bid after a week but they didn't do that.  Brother thinks that overall seems to have clinically deteriorated.  Since her seizure, she has not followed up with PT.  Brother reports that she also does not follow PT advice.  Went from being able to walk to mostly WC bound.  Pt alone during the day but brother lives with her at night.  Brother states that she does nothing but watch TV during the day.  Seems to get confused/dazed by 8pm.  Has EDS.  Hx of OSAS but refused CPAP years ago.  Willing to reconsider.  Brother also c/o day/night  reversal.   Neuroimaging has  previously been performed.  The last MRI of the brain that I can find a report of was done at [facility] in 2015.  Brother states that it was done more recently at [facility] (last December).   The 2015 exam was reported to just show postop changes in the left frontal region and white matter disease.  She did have a CT of the brain when she came to the hospital on 12/09/2015.  This again just demonstrated postoperative changes in the left frontal region.  It was nonacute.  I reviewed this.  Per [facility] oncology notes, MRI from 03/23/15 was compared to MRI from 02/12/12 and was stable.     Treatment History via review of [facility] records:  01/10/06: Craniotomy and debulking performed at outside hospital revealing anaplastic astrocytoma. This was followed by Temozolomide and radiation and then six months of Temozolomide with last cycle of Temozolomide initiated on 08/10/06.  09/30/06: Radiographic evidence of new rim enhancing lesion butting off of the original resection cavity consistent with progressive disease.  10/15/06: New patient evaluation at [facility] [place]. 10/17/06: Craniotomy with resection of tumor with Dr. Derrill Memo, and pathology confirming anaplastic astrocytoma.  11/19/06: Patient initiated on Zactima, Imatinib and Hydroxyurea per protocol.  01/20/07: Patient has completed two cycles on protocol utilizing Zactima, Gleevec, and Hydrea.  02/24/07: Patient is now status post three cycles of Zactima/Gleevec/Hydrea on protocol. She has had a dose reduction for cycle 3 with Zactima at 100 mg p.o. DAILY and Gleevec 300 mg p.o. DAILY due to problems with diarrhea.  03/24/07: Patient is status post four cycles of therapy on protocol with Zactima, Gleevec and Hydrea.  05/19/07: Patient is status post five cycles of Zactima, Gleevec, and Hydroxyurea on protocol; last dose on 04/18/07 and has been off therapy since that time secondary to  requiring surgery on her craniotomy site. We will await recommendations from Dr. Beverely Low in Plastic Surgery prior to continuing with therapy. She is clinically and radiographically stable at this time.  09/22/07: Patient is status post four cycles of VP-16 x 21 days per 28-day cycle.  05/26/08: Status post twelve cycles of Etoposide; off therapy.   04/17/17 update:  The patient also today, accompanied by her brother who supplements the history.  Since our last visit, the patient had a routine and a 24-hour ambulatory EEG.  These demonstrated a breach rhythm and slowing over the left frontotemporal region.  Repeat MRI of the brain did not demonstrate any new lesions.  She had a nocturnal polysomnogram done in November, 2017 because of excessive daytime hypersomnolence and her history of sleep apnea.  This study did not demonstrate sleep apnea and her AHI was only 3.7.  It did demonstrate nocturnal hypoxemia and her oxygen dropped to a low of 65%.  The report also stated that there was "abnormal EEG with sustained  high frequency sharp activity all leads- interpretation deferred to Neurology given medical history."   PREVIOUS MEDICATIONS: n/a  ALLERGIES:   Allergies  Allergen Reactions  . Penicillins     REACTION: RASH  . Phenytoin     REACTION: for seizures, gave her a rash-(dilantin)  . Dilantin [Phenytoin Sodium Extended] Rash  . Penicillins Rash    CURRENT MEDICATIONS:  Outpatient Encounter Prescriptions as of 04/17/2016  Medication Sig  . acetaminophen (TYLENOL) 500 MG tablet Take 500 mg by mouth as needed.  . benzocaine-resorcinol (VAGISIL) 5-2 % vaginal cream as needed as needed  . levETIRAcetam (KEPPRA) 500 MG tablet Take 1 tablet (500 mg total) by mouth 2 (two) times daily.  Marland Kitchen loratadine-pseudoephedrine (CLARITIN-D 24 HOUR) 10-240 MG per 24 hr tablet Take 10-240 mg by mouth as needed.  . methylphenidate (RITALIN) 10 MG tablet Take 10 mg by mouth. 2 in the morning, 1 at night  .  mirabegron ER (MYRBETRIQ) 50 MG TB24 tablet Take 50 mg by mouth daily.  . Multiple Vitamin (MULTIVITAMIN) capsule Take 1 capsule by mouth daily.    Marland Kitchen oxybutynin (DITROPAN XL) 15 MG 24 hr tablet Take 15 mg by mouth at bedtime.  . sertraline (ZOLOFT) 100 MG tablet Take 2 tabs po qday   No facility-administered encounter medications on file as of 04/17/2016.     PAST MEDICAL HISTORY:   Past Medical History:  Diagnosis Date  . Acute sinusitis, unspecified   . Astrocytoma [facility]) 01/2006 /09/2006   grade 3 atrocytoma s/p chemo/radiation/surgery at [place], in remission since at least 2011  . Cancer ([place])    Brain   . Diarrhea   . Drug induced neutropenia(288.03)   . Primary exertional headache   . Recurrent depression ([place])   . Vaginitis     PAST SURGICAL HISTORY:   Past Surgical History:  Procedure Laterality Date  . BRAIN SURGERY  [place] d&c miscarriage  1993  . left frontal craniotomy w/ left subtotal resection of left frontal brain tumor  01/10/2006  . [facility] brain tumor surgery  01/13/2006  . NSVD x 3 - premie at 22 wks    . resection residual tumor  10/08/2006   Dr. Tommi Rumps: [facility]  . skull surg  2009/2010/2012   3 times past brain surg to close up opening    SOCIAL HISTORY:   Social History   Social History  . Marital status: Married    Spouse name: N/A  . Number of children: 3  . 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 Smoking status: Never Smoker  . Smokeless tobacco: Never Used  . Alcohol use No  . Drug use: No  . Sexual activity: No   Other Topics Concern  . Not on file   Social History Narrative   Divorced   From [place], [place]    FAMILY HISTORY:   Family Status  Relation Status  . Mother Alive  . Father Deceased  . Paternal Advertising account executive  . Paternal Aunt Deceased  . Brother Alive  . Brother Alive  . Brother Alive  . Maternal Aunt   . Neg Hx     ROS:  Has urinary  incontinence.  If goes to bathroom every 2 hours, does better.  She sees urology.  A complete 10 system review of systems was obtained and was unremarkable apart from what is mentioned above.  PHYSICAL EXAMINATION:    VITALS:   There were no vitals filed for this visit.  GEN:  Normal appears female in no acute distress.  Appears stated age. HEENT:  Normocephalic, atraumatic. The mucous membranes are moist. The superficial temporal arteries are without ropiness or tenderness. Cardiovascular: Regular rate and rhythm. Lungs: Clear to auscultation bilaterally. Neck/Heme: There are no carotid bruits noted bilaterally.  NEUROLOGICAL: Orientation:  The patient is alert and oriented x 3.  She is however slow to respond and requires help from her brother regarding finer details of the history.   Cranial nerves: There is good facial symmetry. The pupils are equal round and reactive to light bilaterally. Fundoscopic exam reveals clear disc margins bilaterally. Extraocular muscles are intact with mild upgaze paresis and visual fields are full to confrontational testing. Speech is fluent and clear but lacks spontaneity of speech. Soft palate rises symmetrically and there is no tongue deviation. Hearing is intact to conversational tone. Tone: Tone is good throughout. Sensation: Sensation is intact to light touch and pinprick throughout (facial, trunk, extremities). Vibration is intact at the bilateral big toe but slightly decreased. There is no extinction with double simultaneous stimulation. There is no sensory dermatomal level identified. Coordination:  The patient has no difficulty with RAM's or FNF bilaterally. Motor: Strength is 5/5 in the bilateral upper and lower extremities.  Shoulder shrug is equal and symmetric. There is no pronator drift.  There are no fasciculations noted. DTR's: Deep tendon reflexes are 3/4 at the bilateral biceps, triceps, brachioradialis, patella and achilles.  Plantar responses  are downgoing bilaterally. Gait and Station: The patient requires assistance to get out of her wheelchair.  She was given a walker.  She is very slow and wide based.   IMPRESSION/PLAN  1.  Probable seizure  -Talked to the patient about her risk factors for seizure.  Her prior history of several craniotomies for astrocytoma certainly increases  risk for seizure.  In addition, she is taking Ritalin.  I explained to the patient that this medication can lower seizure threshold.  If she absolutely needs the medication (sounds like she may), then this can be continued while monitoring closely for any future seizures.    We talked about seizure and safety extensively.  This included but was not limited to no driving for at least 6 months (she doesn't drive), no working with heavy or dangerous equipment, no swimming alone, no working at heights, no tub baths alone.  -Ambulatory EEG was negative for epileptiform activity.    -has appt with Dr. Ferdinand Lango on 04/30/16  -continue keppra 500 mg bid   2.  Nocturnal hypoxemia  -no associated sleep apnea.  Suspect that she will need nocturnal oxygen.  Will consult sleep medicine.  3.  Frontal lobe syndrome with cognitive impairment  -talked about regular set schedule daily as brother states that more confused and having some day night reversal.  -seeing physicians and psychotherapy at [facility] in this regard  4.  F/u after above completed.  Much greater than 50% of this visit was spent in counseling and coordinating care.  Total face to face time:  60 min   Cc:  [name], MD

## 2016-04-17 ENCOUNTER — Ambulatory Visit: Payer: Commercial Managed Care - HMO | Admitting: Neurology

## 2016-04-17 ENCOUNTER — Ambulatory Visit (INDEPENDENT_AMBULATORY_CARE_PROVIDER_SITE_OTHER): Payer: Commercial Managed Care - HMO | Admitting: Neurology

## 2016-04-17 ENCOUNTER — Encounter: Payer: Self-pay | Admitting: Neurology

## 2016-04-17 VITALS — BP 148/78 | HR 72

## 2016-04-17 DIAGNOSIS — N3944 Nocturnal enuresis: Secondary | ICD-10-CM | POA: Diagnosis not present

## 2016-04-17 DIAGNOSIS — R569 Unspecified convulsions: Secondary | ICD-10-CM

## 2016-04-17 DIAGNOSIS — R4189 Other symptoms and signs involving cognitive functions and awareness: Secondary | ICD-10-CM | POA: Diagnosis not present

## 2016-04-17 DIAGNOSIS — G4734 Idiopathic sleep related nonobstructive alveolar hypoventilation: Secondary | ICD-10-CM

## 2016-04-17 DIAGNOSIS — Z85841 Personal history of malignant neoplasm of brain: Secondary | ICD-10-CM

## 2016-04-17 MED ORDER — LEVETIRACETAM 750 MG PO TABS: 750.0000 mg | ORAL_TABLET | Freq: Two times a day (BID) | ORAL | 3 refills | Status: DC

## 2016-04-17 NOTE — Progress Notes (Signed)
Audrey Turner was seen today in neurologic consultation at the request of [name], MD.  Pt is accompanied by her brother who supplements the history.  The consultation is for the evaluation of new-onset seizure.  I have reviewed prior records made available to me.  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  Her first resection was in October, 2007, followed by a repeat in August, 2008.  She has been followed by [facility] for this.  I did review the notes made available to me.  The patient was also being seen at [facility] by psychotherapy for frontal lobe syndrome/cognitive dysfunction and has been on Ritalin, 15 mg twice a day.  She reports that she was in her normal state of health on 12/09/2015 when she and her brother went to the mattress store (had done PT before that).   Upon exiting her vehicle her brother noted she suddenly stopped and could not move this was followed by right arm jerking.   Her brother states that her left arm shook some but not like the right. She was helped to the ground.  Her eyes rolled back and lasted about 3 minutes.  EMS was contacted and once shaking stopped and waking up, she had trouble moving the right arm.  By the time she got to the hospital, the R arm was stronger and back to baseline.   . Patient has no history of seizure.  She was started on Keppra, 500 mg twice a day.  They were told to go up to 1000mg  bid after a week but they didn't do that.  Brother thinks that overall seems to have clinically deteriorated.  Since her seizure, she has not followed up with PT.  Brother reports that she also does not follow PT advice.  Went from being able to walk to mostly WC bound.  Pt alone during the day but brother lives with her at night.  Brother states that she does nothing but watch TV during the day.  Seems to get confused/dazed by 8pm.  Has EDS.  Hx of OSAS but refused CPAP years ago.  Willing to reconsider.  Brother also c/o day/night  reversal.   Neuroimaging has  previously been performed.  The last MRI of the brain that I can find a report of was done at [facility] in 2015.  Brother states that it was done more recently at [facility] (last December).   The 2015 exam was reported to just show postop changes in the left frontal region and white matter disease.  She did have a CT of the brain when she came to the hospital on 12/09/2015.  This again just demonstrated postoperative changes in the left frontal region.  It was nonacute.  I reviewed this.  Per [facility] oncology notes, MRI from 03/23/15 was compared to MRI from 02/12/12 and was stable.     Treatment History via review of [facility] records:  01/10/06: Craniotomy and debulking performed at outside hospital revealing anaplastic astrocytoma. This was followed by Temozolomide and radiation and then six months of Temozolomide with last cycle of Temozolomide initiated on 08/10/06.  09/30/06: Radiographic evidence of new rim enhancing lesion butting off of the original resection cavity consistent with progressive disease.  10/15/06: New patient evaluation at [facility] [place]. 10/17/06: Craniotomy with resection of tumor with Dr. Derrill Memo, and pathology confirming anaplastic astrocytoma.  11/19/06: Patient initiated on Zactima, Imatinib and Hydroxyurea per protocol.  01/20/07: Patient has completed two cycles on protocol utilizing Zactima, Gleevec, and Hydrea.  02/24/07: Patient is now status post three cycles of Zactima/Gleevec/Hydrea on protocol. She has had a dose reduction for cycle 3 with Zactima at 100 mg p.o. DAILY and Gleevec 300 mg p.o. DAILY due to problems with diarrhea.  03/24/07: Patient is status post four cycles of therapy on protocol with Zactima, Gleevec and Hydrea.  05/19/07: Patient is status post five cycles of Zactima, Gleevec, and Hydroxyurea on protocol; last dose on 04/18/07 and has been off therapy since that time secondary to  requiring surgery on her craniotomy site. We will await recommendations from Dr. Beverely Low in Plastic Surgery prior to continuing with therapy. She is clinically and radiographically stable at this time.  09/22/07: Patient is status post four cycles of VP-16 x 21 days per 28-day cycle.  05/26/08: Status post twelve cycles of Etoposide; off therapy.   04/17/16 update:  The patient also today, accompanied by her brother who supplements the history.  Since our last visit, the patient had a routine and a 24-hour ambulatory EEG.  These demonstrated a breach rhythm and slowing over the left frontotemporal region.  Repeat MRI of the brain did not demonstrate any new lesions.  She had a nocturnal polysomnogram done in November, 2017 because of excessive daytime hypersomnolence and her history of sleep apnea.  This study did not demonstrate sleep apnea and her AHI was only 3.7.  It did demonstrate nocturnal hypoxemia and her oxygen dropped to a low of 65%.  The report also stated that there was "abnormal EEG with sustained  high frequency sharp activity all leads- interpretation deferred to Neurology given medical history."  Saw neuropsych at [facility] and felt that they could have mini seizures as having spells where she will seem to stare and have trouble moving legs, especially the right leg.  It lasts about 1 minute and then she is back to baseline.  If walking she will say "I can't move my legs" and sometimes she will lose train of thought and not remember what she is talking about.  Brother states that it may have 1-2 times per evening.  Told brother yesterday that she wondered if she had a seizure as she woke up shaking and couldn't stop.  Brother states that pad on her bed was wet upon awakening (not necessarily unusual).  She seemed lethargic the rest of the day.  Brother asks about going up on keppra.  He does describe some mood issues, "stubborness" and occasional "agitation" but not sure that isn't baseline and not  from the keppra.  Brother worried about her being alone despite med alert bracelet.  Asks about getting some in home care.     PREVIOUS MEDICATIONS: n/a  ALLERGIES:   Allergies  Allergen Reactions  . Penicillins     REACTION: RASH  . Phenytoin     REACTION: for seizures, gave her a rash-(dilantin)  . Dilantin [Phenytoin Sodium Extended] Rash  . Penicillins Rash    CURRENT MEDICATIONS:  Outpatient Encounter Prescriptions as of 04/17/2016  Medication Sig  . acetaminophen (TYLENOL) 500 MG tablet Take 500 mg by mouth as needed.  . ARIPiprazole (ABILIFY) 5 MG tablet Take 5 mg by mouth daily.  . benzocaine-resorcinol (VAGISIL) 5-2 % vaginal cream as needed as needed  . levETIRAcetam (KEPPRA) 500 MG tablet Take 1 tablet (500 mg total) by mouth 2 (two) times daily.  Marland Kitchen loratadine-pseudoephedrine (CLARITIN-D 24 HOUR) 10-240 MG per 24 hr tablet Take  10-240 mg by mouth as needed.  . Multiple Vitamin (MULTIVITAMIN) capsule Take 1 capsule by mouth daily.    Marland Kitchen oxybutynin (DITROPAN XL) 15 MG 24 hr tablet Take 15 mg by mouth at bedtime.  . sertraline (ZOLOFT) 100 MG tablet Take 2 tabs po qday  . [DISCONTINUED] methylphenidate (RITALIN) 10 MG tablet Take 10 mg by mouth. 2 in the morning, 1 at night  . [DISCONTINUED] mirabegron ER (MYRBETRIQ) 50 MG TB24 tablet Take 50 mg by mouth daily.   No facility-administered encounter medications on file as of 04/17/2016.     PAST MEDICAL HISTORY:   Past Medical History:  Diagnosis Date  . Acute sinusitis, unspecified   . Astrocytoma [facility]) 01/2006 /09/2006   grade 3 atrocytoma s/p chemo/radiation/surgery at [place], in remission since at least 2011  . Cancer ([place])    Brain   . Diarrhea   . Drug induced neutropenia(288.03)   . Primary exertional headache   . Recurrent depression ([place])   . Vaginitis     PAST SURGICAL HISTORY:   Past Surgical History:  Procedure Laterality Date  . [facility].  . d&c miscarriage  1993  . left frontal  craniotomy w/ left subtotal resection of left frontal brain tumor  01/10/2006  . [facility] brain tumor surgery  01/13/2006  . NSVD x 3 - premie at 22 wks    . resection residual tumor  10/08/2006   Dr. Tommi Rumps: [facility]  . skull surg  2009/2010/2012   3 times past brain surg to close up opening    SOCIAL HISTORY:   Social History   Social History  . Marital status: Married    Spouse name: N/A  . Number of children: 3  . 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 Smoking status: Never Smoker  . Smokeless tobacco: Never Used  . Alcohol use No  . Drug use: No  . Sexual activity: No   Other Topics Concern  . Not on file   Social History Narrative   Divorced   From [place], [place]    FAMILY HISTORY:   Family Status  Relation Status  . Mother Alive  . Father Deceased  . Paternal Advertising account executive  . Paternal Aunt Deceased  . Brother Alive  . Brother Alive  . Brother Alive  . Maternal Aunt   . Neg Hx     ROS:  Has urinary incontinence.  If goes to bathroom every 2 hours, does better.  She sees urology.  A complete 10 system review of systems was obtained and was unremarkable apart from what is mentioned above.  PHYSICAL EXAMINATION:    VITALS:   Vitals:   04/17/16 0910  BP: (!) 148/78  Pulse: 72    GEN:  Normal appears female in no acute distress.  Appears stated age. HEENT:  Normocephalic, atraumatic. The mucous membranes are moist. The superficial temporal arteries are without ropiness or tenderness. Cardiovascular: Regular rate and rhythm. Lungs: Clear to auscultation bilaterally. Neck/Heme: There are no carotid bruits noted bilaterally.  NEUROLOGICAL: Orientation:  The patient is alert and oriented x 3.  She is however slow to respond and requires help from her brother regarding finer details of the history.   Cranial nerves: There is good facial symmetry but there is facial hypomimia. The pupils are equal  round and reactive to light bilaterally. Fundoscopic exam reveals clear disc  margins bilaterally. Extraocular muscles are intact with mild upgaze paresis and visual fields are full to confrontational testing. Speech is fluent and clear but lacks spontaneity of speech. Soft palate rises symmetrically and there is no tongue deviation. Hearing is intact to conversational tone. Tone: Tone is good throughout. Sensation: Sensation is intact to light touch throughout Coordination:  The patient has no difficulty with RAM's or FNF bilaterally. Motor: Strength is 5/5 in the bilateral upper and lower extremities.  Shoulder shrug is equal and symmetric. There is no pronator drift.  There are no fasciculations noted. Gait and Station: The patient requires assistance to get out of her wheelchair.  She was given a walker.  She is very slow and wide based but shuffles with short steps and has to be repetitively reminded to either keep walking, take bigger steps or to stay inside the walker provided.     IMPRESSION/PLAN  1.  Probable seizure  -Talked to the patient about her risk factors for seizure.  Her prior history of several craniotomies for astrocytoma certainly increases risk for seizure.  In addition, she is taking Ritalin.  I explained to the patient that this medication can lower seizure threshold.  If she absolutely needs the medication (sounds like she may), then this can be continued while monitoring closely for any future seizures.    We talked about seizure and safety extensively.  This included but was not limited to no driving for at least 6 months (she doesn't drive), no working with heavy or dangerous equipment, no swimming alone, no working at heights, no tub baths alone.  -Ambulatory EEG was negative for epileptiform activity.    -has appt with Dr. Ferdinand Lango on 04/30/16  -increase keppra to 750 mg bid.  Watch for mood changes.  Risks, benefits, side effects and alternative therapies were discussed.   The opportunity to ask questions was given and they were answered to the best of my ability.  The patient expressed understanding and willingness to follow the outlined treatment protocols.   2.  Nocturnal hypoxemia  -no associated sleep apnea.  Suspect that she will need nocturnal oxygen.  Will consult sleep medicine.  3.  Frontal lobe syndrome with cognitive impairment  -talked about regular set schedule daily as brother states that more confused and having some day night reversal.  -seeing physicians and psychotherapy at [facility] in this regard  4.  gait instability  -shuffling unstable gait and frontal lobe syndrome makes using walker difficult.  Last time she went to PT she didn't follow through well, and I think that she could benefit from home PT.  I am concerned with her living alone in the home and don't think that she should be alone.  Will ask for social work and home health consult.  -on abilify since Nov, 2017.  Will need to make sure that gait doesn't deteriorate as already has short shuffling gait that brother reports started before abilify.  5.  Going to transfer patient to Dr. Amparo [facility] clinic given her expertise in this area.  Talked to patient and Dr. Delice Lesch about this.  Much greater than 50% of this visit was spent in counseling and coordinating care.  Total face to face time:  30 min    Cc:  [name], MD

## 2016-04-17 NOTE — Patient Instructions (Signed)
1. Increase Keppra to 750 mg - one tablet twice daily. New prescription sent to pharmacy.  Until you run out of Keppra 500 mg you can take two tablets in the morning, one tablet in the afternoon.   2. Referral placed to [facility] for a sleep consult. They should call you with an appt. If you do not hear from them they can be reached at [phone].  3. Referral placed for home health assessment and physical therapy. This was sent to [facility] and they will call you to schedule.   4. Appt made with Dr. Delice Lesch to follow up in 3 months.

## 2016-04-23 DIAGNOSIS — F070 Personality change due to known physiological condition: Secondary | ICD-10-CM | POA: Diagnosis not present

## 2016-04-23 DIAGNOSIS — I10 Essential (primary) hypertension: Secondary | ICD-10-CM | POA: Diagnosis not present

## 2016-04-23 DIAGNOSIS — G40909 Epilepsy, unspecified, not intractable, without status epilepticus: Secondary | ICD-10-CM | POA: Diagnosis not present

## 2016-04-23 DIAGNOSIS — G9782 Other postprocedural complications and disorders of nervous system: Secondary | ICD-10-CM | POA: Diagnosis not present

## 2016-04-23 DIAGNOSIS — F329 Major depressive disorder, single episode, unspecified: Secondary | ICD-10-CM | POA: Diagnosis not present

## 2016-04-23 DIAGNOSIS — Z85841 Personal history of malignant neoplasm of brain: Secondary | ICD-10-CM | POA: Diagnosis not present

## 2016-04-24 DIAGNOSIS — F329 Major depressive disorder, single episode, unspecified: Secondary | ICD-10-CM | POA: Diagnosis not present

## 2016-04-24 DIAGNOSIS — G9782 Other postprocedural complications and disorders of nervous system: Secondary | ICD-10-CM | POA: Diagnosis not present

## 2016-04-24 DIAGNOSIS — I10 Essential (primary) hypertension: Secondary | ICD-10-CM | POA: Diagnosis not present

## 2016-04-24 DIAGNOSIS — G40909 Epilepsy, unspecified, not intractable, without status epilepticus: Secondary | ICD-10-CM | POA: Diagnosis not present

## 2016-04-24 DIAGNOSIS — Z85841 Personal history of malignant neoplasm of brain: Secondary | ICD-10-CM | POA: Diagnosis not present

## 2016-04-24 DIAGNOSIS — F070 Personality change due to known physiological condition: Secondary | ICD-10-CM | POA: Diagnosis not present

## 2016-04-30 ENCOUNTER — Telehealth: Payer: Self-pay

## 2016-04-30 ENCOUNTER — Telehealth: Payer: Self-pay | Admitting: Neurology

## 2016-04-30 DIAGNOSIS — I10 Essential (primary) hypertension: Secondary | ICD-10-CM | POA: Diagnosis not present

## 2016-04-30 DIAGNOSIS — G40909 Epilepsy, unspecified, not intractable, without status epilepticus: Secondary | ICD-10-CM | POA: Diagnosis not present

## 2016-04-30 DIAGNOSIS — F070 Personality change due to known physiological condition: Secondary | ICD-10-CM | POA: Diagnosis not present

## 2016-04-30 DIAGNOSIS — Z85841 Personal history of malignant neoplasm of brain: Secondary | ICD-10-CM | POA: Diagnosis not present

## 2016-04-30 DIAGNOSIS — F329 Major depressive disorder, single episode, unspecified: Secondary | ICD-10-CM | POA: Diagnosis not present

## 2016-04-30 DIAGNOSIS — G9782 Other postprocedural complications and disorders of nervous system: Secondary | ICD-10-CM | POA: Diagnosis not present

## 2016-04-30 NOTE — Telephone Encounter (Signed)
Number provided goes straight to voicemail and no mailbox has been set up yet.

## 2016-04-30 NOTE — Telephone Encounter (Signed)
Geni Bers from [facility] called for verbal order to see patient 1 time this week for Advanced Directives. [phone]

## 2016-04-30 NOTE — Telephone Encounter (Signed)
[name] Moon, PT, called for verbal order to see patient 1x wk for 1 wk, 2x wk for 3 wks, then 1x wk for 1 wk.  [phone].

## 2016-05-01 NOTE — Telephone Encounter (Signed)
Melissa with [facility] called and I gave verbal orders for PT.

## 2016-05-03 DIAGNOSIS — F070 Personality change due to known physiological condition: Secondary | ICD-10-CM | POA: Diagnosis not present

## 2016-05-03 DIAGNOSIS — I10 Essential (primary) hypertension: Secondary | ICD-10-CM | POA: Diagnosis not present

## 2016-05-03 DIAGNOSIS — G40909 Epilepsy, unspecified, not intractable, without status epilepticus: Secondary | ICD-10-CM | POA: Diagnosis not present

## 2016-05-03 DIAGNOSIS — F329 Major depressive disorder, single episode, unspecified: Secondary | ICD-10-CM | POA: Diagnosis not present

## 2016-05-03 DIAGNOSIS — Z85841 Personal history of malignant neoplasm of brain: Secondary | ICD-10-CM | POA: Diagnosis not present

## 2016-05-03 DIAGNOSIS — G9782 Other postprocedural complications and disorders of nervous system: Secondary | ICD-10-CM | POA: Diagnosis not present

## 2016-05-07 ENCOUNTER — Telehealth: Payer: Self-pay | Admitting: Neurology

## 2016-05-07 DIAGNOSIS — Z85841 Personal history of malignant neoplasm of brain: Secondary | ICD-10-CM | POA: Diagnosis not present

## 2016-05-07 DIAGNOSIS — I10 Essential (primary) hypertension: Secondary | ICD-10-CM | POA: Diagnosis not present

## 2016-05-07 DIAGNOSIS — G9782 Other postprocedural complications and disorders of nervous system: Secondary | ICD-10-CM | POA: Diagnosis not present

## 2016-05-07 DIAGNOSIS — G40909 Epilepsy, unspecified, not intractable, without status epilepticus: Secondary | ICD-10-CM | POA: Diagnosis not present

## 2016-05-07 DIAGNOSIS — F329 Major depressive disorder, single episode, unspecified: Secondary | ICD-10-CM | POA: Diagnosis not present

## 2016-05-07 DIAGNOSIS — F070 Personality change due to known physiological condition: Secondary | ICD-10-CM | POA: Diagnosis not present

## 2016-05-07 NOTE — Telephone Encounter (Signed)
How does that work?  Was she not home when they came?  Call her brother (he is the main contact) and find out the issue.

## 2016-05-07 NOTE — Telephone Encounter (Signed)
-----   Message from [facility] sent at 05/04/2016  3:58 PM EST ----- [name] with [facility] called and wanted Dr Tat to know she missed both physical therapy appointments/Dawn CB# [phone]

## 2016-05-07 NOTE — Telephone Encounter (Signed)
fyi

## 2016-05-07 NOTE — Telephone Encounter (Signed)
Spoke with patient's brother and [name], PT with [facility]. The visits were delayed due to the snow last week and she had appts scheduled with other therapies last week- she called to get an order to see patient this week for PT. Order given.

## 2016-05-10 DIAGNOSIS — F070 Personality change due to known physiological condition: Secondary | ICD-10-CM | POA: Diagnosis not present

## 2016-05-10 DIAGNOSIS — Z85841 Personal history of malignant neoplasm of brain: Secondary | ICD-10-CM | POA: Diagnosis not present

## 2016-05-10 DIAGNOSIS — F329 Major depressive disorder, single episode, unspecified: Secondary | ICD-10-CM | POA: Diagnosis not present

## 2016-05-10 DIAGNOSIS — G9782 Other postprocedural complications and disorders of nervous system: Secondary | ICD-10-CM | POA: Diagnosis not present

## 2016-05-10 DIAGNOSIS — G40909 Epilepsy, unspecified, not intractable, without status epilepticus: Secondary | ICD-10-CM | POA: Diagnosis not present

## 2016-05-10 DIAGNOSIS — I10 Essential (primary) hypertension: Secondary | ICD-10-CM | POA: Diagnosis not present

## 2016-05-11 ENCOUNTER — Telehealth: Payer: Self-pay | Admitting: Neurology

## 2016-05-11 DIAGNOSIS — Z85841 Personal history of malignant neoplasm of brain: Secondary | ICD-10-CM | POA: Diagnosis not present

## 2016-05-11 DIAGNOSIS — F070 Personality change due to known physiological condition: Secondary | ICD-10-CM | POA: Diagnosis not present

## 2016-05-11 DIAGNOSIS — G9782 Other postprocedural complications and disorders of nervous system: Secondary | ICD-10-CM | POA: Diagnosis not present

## 2016-05-11 DIAGNOSIS — G40909 Epilepsy, unspecified, not intractable, without status epilepticus: Secondary | ICD-10-CM | POA: Diagnosis not present

## 2016-05-11 DIAGNOSIS — I10 Essential (primary) hypertension: Secondary | ICD-10-CM | POA: Diagnosis not present

## 2016-05-11 DIAGNOSIS — F329 Major depressive disorder, single episode, unspecified: Secondary | ICD-10-CM | POA: Diagnosis not present

## 2016-05-11 NOTE — Telephone Encounter (Signed)
Audrey Turner with [facility] OT called to let us know that patient requested to move OT evaluation to next week due to being out of town. [phone].

## 2016-05-12 DIAGNOSIS — F070 Personality change due to known physiological condition: Secondary | ICD-10-CM | POA: Diagnosis not present

## 2016-05-12 DIAGNOSIS — F329 Major depressive disorder, single episode, unspecified: Secondary | ICD-10-CM | POA: Diagnosis not present

## 2016-05-12 DIAGNOSIS — Z85841 Personal history of malignant neoplasm of brain: Secondary | ICD-10-CM | POA: Diagnosis not present

## 2016-05-12 DIAGNOSIS — G40909 Epilepsy, unspecified, not intractable, without status epilepticus: Secondary | ICD-10-CM | POA: Diagnosis not present

## 2016-05-12 DIAGNOSIS — I10 Essential (primary) hypertension: Secondary | ICD-10-CM | POA: Diagnosis not present

## 2016-05-12 DIAGNOSIS — G9782 Other postprocedural complications and disorders of nervous system: Secondary | ICD-10-CM | POA: Diagnosis not present

## 2016-05-14 DIAGNOSIS — G40909 Epilepsy, unspecified, not intractable, without status epilepticus: Secondary | ICD-10-CM | POA: Diagnosis not present

## 2016-05-14 DIAGNOSIS — I10 Essential (primary) hypertension: Secondary | ICD-10-CM | POA: Diagnosis not present

## 2016-05-14 DIAGNOSIS — Z85841 Personal history of malignant neoplasm of brain: Secondary | ICD-10-CM | POA: Diagnosis not present

## 2016-05-14 DIAGNOSIS — F070 Personality change due to known physiological condition: Secondary | ICD-10-CM | POA: Diagnosis not present

## 2016-05-14 DIAGNOSIS — G9782 Other postprocedural complications and disorders of nervous system: Secondary | ICD-10-CM | POA: Diagnosis not present

## 2016-05-14 DIAGNOSIS — F329 Major depressive disorder, single episode, unspecified: Secondary | ICD-10-CM | POA: Diagnosis not present

## 2016-05-17 ENCOUNTER — Telehealth: Payer: Self-pay | Admitting: Neurology

## 2016-05-17 NOTE — Telephone Encounter (Signed)
Gave verbal order to [place].

## 2016-05-17 NOTE — Telephone Encounter (Signed)
-----   Message from Elenora Fender sent at 05/17/2016  2:35 PM EST ----- Regarding: Verbal Orders Contact: [facility] (OT) called needing to get verbal orders on this patient. She is needing 6 visits for safety and upper body strength techniques and ADL's. She said 1 week 1, 2 week 2, and 1 week 1?   Thank you Theadora Rama

## 2016-05-18 DIAGNOSIS — G9782 Other postprocedural complications and disorders of nervous system: Secondary | ICD-10-CM | POA: Diagnosis not present

## 2016-05-18 DIAGNOSIS — F329 Major depressive disorder, single episode, unspecified: Secondary | ICD-10-CM | POA: Diagnosis not present

## 2016-05-18 DIAGNOSIS — Z85841 Personal history of malignant neoplasm of brain: Secondary | ICD-10-CM | POA: Diagnosis not present

## 2016-05-18 DIAGNOSIS — I10 Essential (primary) hypertension: Secondary | ICD-10-CM | POA: Diagnosis not present

## 2016-05-18 DIAGNOSIS — G40909 Epilepsy, unspecified, not intractable, without status epilepticus: Secondary | ICD-10-CM | POA: Diagnosis not present

## 2016-05-18 DIAGNOSIS — F070 Personality change due to known physiological condition: Secondary | ICD-10-CM | POA: Diagnosis not present

## 2016-05-24 DIAGNOSIS — Z85841 Personal history of malignant neoplasm of brain: Secondary | ICD-10-CM | POA: Diagnosis not present

## 2016-05-24 DIAGNOSIS — G40909 Epilepsy, unspecified, not intractable, without status epilepticus: Secondary | ICD-10-CM | POA: Diagnosis not present

## 2016-05-24 DIAGNOSIS — I10 Essential (primary) hypertension: Secondary | ICD-10-CM | POA: Diagnosis not present

## 2016-05-24 DIAGNOSIS — F070 Personality change due to known physiological condition: Secondary | ICD-10-CM | POA: Diagnosis not present

## 2016-05-24 DIAGNOSIS — G9782 Other postprocedural complications and disorders of nervous system: Secondary | ICD-10-CM | POA: Diagnosis not present

## 2016-05-24 DIAGNOSIS — F329 Major depressive disorder, single episode, unspecified: Secondary | ICD-10-CM | POA: Diagnosis not present

## 2016-06-04 ENCOUNTER — Telehealth: Payer: Self-pay | Admitting: Neurology

## 2016-06-04 NOTE — Telephone Encounter (Signed)
Tried to call number provided with no answer and no voicemail set up.

## 2016-06-04 NOTE — Telephone Encounter (Signed)
[name] Moon with [facility] [phone] called and needs to move appts to next week due to patient staying with mother since brother is out of town. Ok given.

## 2016-06-04 NOTE — Telephone Encounter (Signed)
-----   Message from [facility] sent at 06/04/2016  8:43 AM EST ----- [facility] called in regards to PT and needing an order/Dawn  CB# [phone]

## 2016-06-06 ENCOUNTER — Institutional Professional Consult (permissible substitution): Payer: Self-pay | Admitting: Pulmonary Disease

## 2016-06-11 ENCOUNTER — Telehealth: Payer: Self-pay | Admitting: Neurology

## 2016-06-11 NOTE — Telephone Encounter (Signed)
Left message on machine for [place] with [facility] to call back.

## 2016-06-11 NOTE — Telephone Encounter (Signed)
-----   Message from [facility] sent at 06/11/2016  8:22 AM EST ----- [facility] called in regards to PT/Dawn CB# [phone]

## 2016-06-13 ENCOUNTER — Telehealth: Payer: Self-pay | Admitting: Neurology

## 2016-06-13 NOTE — Telephone Encounter (Signed)
Received a call from [facility] with [facility] to let us know that patient is being d/c from home health.

## 2016-06-14 ENCOUNTER — Institutional Professional Consult (permissible substitution): Payer: Self-pay | Admitting: Internal Medicine

## 2016-06-14 DIAGNOSIS — F329 Major depressive disorder, single episode, unspecified: Secondary | ICD-10-CM | POA: Diagnosis not present

## 2016-06-14 DIAGNOSIS — I10 Essential (primary) hypertension: Secondary | ICD-10-CM | POA: Diagnosis not present

## 2016-06-14 DIAGNOSIS — Z85841 Personal history of malignant neoplasm of brain: Secondary | ICD-10-CM | POA: Diagnosis not present

## 2016-06-14 DIAGNOSIS — G40909 Epilepsy, unspecified, not intractable, without status epilepticus: Secondary | ICD-10-CM | POA: Diagnosis not present

## 2016-06-14 DIAGNOSIS — G9782 Other postprocedural complications and disorders of nervous system: Secondary | ICD-10-CM | POA: Diagnosis not present

## 2016-06-14 DIAGNOSIS — F070 Personality change due to known physiological condition: Secondary | ICD-10-CM | POA: Diagnosis not present

## 2016-06-15 ENCOUNTER — Institutional Professional Consult (permissible substitution): Payer: Commercial Managed Care - HMO | Admitting: Internal Medicine

## 2016-06-19 DIAGNOSIS — I10 Essential (primary) hypertension: Secondary | ICD-10-CM | POA: Diagnosis not present

## 2016-06-19 DIAGNOSIS — Z85841 Personal history of malignant neoplasm of brain: Secondary | ICD-10-CM | POA: Diagnosis not present

## 2016-06-19 DIAGNOSIS — G40909 Epilepsy, unspecified, not intractable, without status epilepticus: Secondary | ICD-10-CM | POA: Diagnosis not present

## 2016-06-19 DIAGNOSIS — G9782 Other postprocedural complications and disorders of nervous system: Secondary | ICD-10-CM | POA: Diagnosis not present

## 2016-06-19 DIAGNOSIS — F070 Personality change due to known physiological condition: Secondary | ICD-10-CM | POA: Diagnosis not present

## 2016-06-19 DIAGNOSIS — F329 Major depressive disorder, single episode, unspecified: Secondary | ICD-10-CM | POA: Diagnosis not present

## 2016-07-03 ENCOUNTER — Ambulatory Visit (INDEPENDENT_AMBULATORY_CARE_PROVIDER_SITE_OTHER): Payer: Medicare HMO | Admitting: Internal Medicine

## 2016-07-03 ENCOUNTER — Encounter: Payer: Self-pay | Admitting: Internal Medicine

## 2016-07-03 ENCOUNTER — Other Ambulatory Visit (INDEPENDENT_AMBULATORY_CARE_PROVIDER_SITE_OTHER): Payer: Self-pay

## 2016-07-03 ENCOUNTER — Ambulatory Visit (INDEPENDENT_AMBULATORY_CARE_PROVIDER_SITE_OTHER)
Admission: RE | Admit: 2016-07-03 | Discharge: 2016-07-03 | Disposition: A | Discharge status: Home / Self Care | Payer: Medicare HMO | Source: Ambulatory Visit | Attending: Internal Medicine | Admitting: Internal Medicine

## 2016-07-03 VITALS — BP 120/78 | HR 69 | Ht 59.0 in | Wt 172.2 lb

## 2016-07-03 DIAGNOSIS — G4733 Obstructive sleep apnea (adult) (pediatric): Secondary | ICD-10-CM | POA: Diagnosis not present

## 2016-07-03 DIAGNOSIS — R0902 Hypoxemia: Secondary | ICD-10-CM

## 2016-07-03 DIAGNOSIS — G4734 Idiopathic sleep related nonobstructive alveolar hypoventilation: Secondary | ICD-10-CM | POA: Diagnosis not present

## 2016-07-03 LAB — BASIC METABOLIC PANEL
BUN: 13 mg/dL (ref 6–23)
CO2: 32 mEq/L (ref 19–32)
Calcium: 9.3 mg/dL (ref 8.4–10.5)
Chloride: 100 mEq/L (ref 96–112)
Creatinine, Ser: 0.64 mg/dL (ref 0.40–1.20)
GFR: 102.93 mL/min (ref >60.00)
Glucose, Bld: 87 mg/dL (ref 70–99)
POTASSIUM: 4.2 meq/L (ref 3.5–5.1)
Sodium: 138 mEq/L (ref 135–145)

## 2016-07-03 LAB — CBC WITH DIFFERENTIAL/PLATELET
BASOS PCT: 0.4 % (ref 0.0–3.0)
Basophils Absolute: 0 10*3/uL (ref 0.0–0.1)
EOS ABS: 0.2 10*3/uL (ref 0.0–0.7)
Eosinophils Relative: 2.3 % (ref 0.0–5.0)
HCT: 37.4 % (ref 36.0–46.0)
HEMOGLOBIN: 12.6 g/dL (ref 12.0–15.0)
Lymphocytes Relative: 41.4 % (ref 12.0–46.0)
Lymphs Abs: 3 10*3/uL (ref 0.7–4.0)
MCHC: 33.7 g/dL (ref 30.0–36.0)
MCV: 74.8 fl — ABNORMAL LOW (ref 78.0–100.0)
MONO ABS: 0.4 10*3/uL (ref 0.1–1.0)
Monocytes Relative: 5.2 % (ref 3.0–12.0)
NEUTROS ABS: 3.7 10*3/uL (ref 1.4–7.7)
NEUTROS PCT: 50.7 % (ref 43.0–77.0)
PLATELETS: 357 10*3/uL (ref 150.0–400.0)
RBC: 5 Mil/uL (ref 3.87–5.11)
RDW: 16.6 % — ABNORMAL HIGH (ref 11.5–15.5)
WBC: 7.2 10*3/uL (ref 4.0–10.5)

## 2016-07-03 LAB — BRAIN NATRIURETIC PEPTIDE: Pro B Natriuretic peptide (BNP): 76 pg/mL (ref 0.0–100.0)

## 2016-07-03 NOTE — Patient Instructions (Signed)
Order- Office spirometry    Dx hypoxia  Order-  CXR     Dx hypoxia  Order- ONOX on room air- Dx Hypoxia  Please call as needed

## 2016-07-03 NOTE — Assessment & Plan Note (Signed)
She refused CPAP in 2011 when originally diagnosed. Since then she has lost almost 40 pounds which may explain why she did not have significant apnea documented on the most recent study. She did have some trouble initiating sleep and was noted to snore. If concern remains than a repeat sleep study may be justified later. Plan-further weight loss, sleep off flat of back. I'm not sure that further intervention with a mouthpiece or chin strap would be a good idea

## 2016-07-03 NOTE — Assessment & Plan Note (Signed)
We need to clarify how much she is desaturating at night now and the best way to do that is with overnight oximetry at her home. She never smoked and denies history of lung disease but medical history has been complicated. She has peripheral edema, probably from prolonged sitting, but some degree of fluid overload/CHF is not excluded and might explain postural hypoxia, as might obesity/hypoventilation syndrome Plan-overnight oximetry, labs for d-dimer, BNP, BMET, CBC

## 2016-07-03 NOTE — Progress Notes (Signed)
07/03/2016- Referred by Dr Tat; Sleep Study completed 02/2016. Pt not on CPAP with concern of sleep associated hypoxemia. She comes with her brother. PCP is Dr. [name]. She has a history of seizure leading to diagnosis of astrocytoma with multiple resection procedures in 2007 and 2008, then XRT/ chemo at [facility] where she was also followed by psychotherapy for cognitive dysfunction treated with Ritalin 15 mg twice daily. She had prior diagnosis of obstructive sleep apnea with NPSG 01/17/10- AHI 31/ hr, desaturation to 68%, body weight 202 lbs.  NPSG 02/26/16- AHI 3.7/hour (WNL) with mean oxygen saturation 93.4% and nadir 65%, moderate snoring, body weight 168 pounds. Brother confirms the significant weight loss which he attributes to now living with her and controlling her diet better. Previously she only ate sweets. He sleeps in a separate room but has a baby monitor and says she does not snore now. She says she wakes frequently during the night but does not need bathroom, is not dyspneic. Sleeps on 2 pillows. She is alone at home much of the day, sitting and watching TV. There is no history of ENT surgery or lung disease and she never smoked. She denies asthma, heart disease, history of DVT/thromboembolism. Office Spirometry 07/03/2016-unreliable, submaximal effort read as "moderate restriction". FVC 1.90/66%, FEV1 1.43/63%, ratio 0.75, FEF 25-75% 1.26/53%  Prior to Admission medications   Medication Sig Start Date End Date Taking? Authorizing Provider  acetaminophen (TYLENOL) 500 MG tablet Take 500 mg by mouth as needed. 09/22/08  Yes Historical Provider, MD  ARIPiprazole (ABILIFY) 5 MG tablet Take 5 mg by mouth daily. 03/29/16 07/03/16 Yes Historical Provider, MD  benzocaine-resorcinol (VAGISIL) 5-2 % vaginal cream as needed as needed 10/19/09  Yes Historical Provider, MD  levETIRAcetam (KEPPRA) 750 MG tablet Take 1 tablet (750 mg total) by mouth 2 (two) times daily. 04/17/16  Yes Rebecca S Tat, DO   loratadine-pseudoephedrine (CLARITIN-D 24 HOUR) 10-240 MG per 24 hr tablet Take 10-240 mg by mouth as needed. 09/22/08  Yes Historical Provider, MD  Multiple Vitamin (MULTIVITAMIN) capsule Take 1 capsule by mouth daily.     Yes Historical Provider, MD  oxybutynin (DITROPAN XL) 15 MG 24 hr tablet Take 15 mg by mouth at bedtime.   Yes Historical Provider, MD  sertraline (ZOLOFT) 100 MG tablet Take 2 tabs po qday 12/08/15  Yes Historical Provider, MD   Past Medical History:  Diagnosis Date  . Acute sinusitis, unspecified   . Astrocytoma [facility]) 01/2006 /09/2006   grade 3 atrocytoma s/p chemo/radiation/surgery at [place], in remission since at least 2011  . Cancer ([place])    Brain   . Diarrhea   . Drug induced neutropenia(288.03)   . Primary exertional headache   . Recurrent depression ([place])   . Vaginitis    Past Surgical History:  Procedure Laterality Date  . [facility].  . d&c miscarriage  1993  . left frontal craniotomy w/ left subtotal resection of left frontal brain tumor  01/10/2006  . [facility] brain tumor surgery  01/13/2006  . NSVD x 3 - premie at 22 wks    . resection residual tumor  10/08/2006   Dr. Tommi Rumps: [facility]  . skull surg  2009/2010/2012   3 times past brain surg to close up opening   Family History  Problem Relation Age of Onset  . Hypertension Mother   . Hypertension Father   . COPD Father   . Sleep apnea Father   . Breast cancer Paternal  Aunt     50's  . Brain cancer Paternal Aunt   . Ovarian cancer Maternal Aunt   . Colon cancer Neg Hx    Social History   Social History  . Marital status: Divorced    Spouse name: N/A  . Number of children: 3  . 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 Smoking status: Never Smoker  . Smokeless tobacco: Never Used  . Alcohol use No  . Drug use: No  . Sexual activity: No   Other Topics Concern  . Not on file   Social History Narrative    Divorced   From [place], [place]   ROS-see HPI   Negative unless "+" Constitutional:    weight loss, night sweats, fevers, chills, fatigue, lassitude. HEENT:    headaches, difficulty swallowing, tooth/dental problems, sore throat,       sneezing, itching, ear ache, nasal congestion, post nasal drip, snoring CV:    chest pain, orthopnea, PND, swelling in lower extremities, anasarca,                                                           izziness, palpitations Resp:   shortness of breath with exertion or at rest.                productive cough,   non-productive cough, coughing up of blood.              change in color of mucus.  wheezing.   Skin:    rash or lesions. GI:  No-   heartburn, indigestion, abdominal pain, nausea, vomiting, diarrhea,                 change in bowel habits, loss of appetite GU: dysuria, change in color of urine, no urgency or frequency.   flank pain. MS:   joint pain, stiffness, decreased range of motion, back pain. Neuro-     HPI/ per Neurolgy Psych:  HPI  OBJ- Physical Exam General- Alert, Oriented, Affect-flat, Distress- none acute. Wheelchair. Room air resting saturation 99%+ Obese Skin- rash-none, lesions- none, excoriation- none Lymphadenopathy- none Head- atraumatic            Eyes- Gross vision intact, PERRLA, conjunctivae and secretions clear            Ears- Hearing, canals-normal            Nose- Clear, no-Septal dev, mucus, polyps, erosion, perforation             Throat- Mallampati II , mucosa clear , drainage- none, tonsils- atrophic Neck- flexible , trachea midline, no stridor , thyroid nl, carotid no bruit Chest - symmetrical excursion , unlabored           Heart/CV- RRR , no murmur , no gallop  , no rub, nl s1 s2                           - JVD+ 1 , edema +2, stasis changes- none, varices- none           Lung- clear to P&A, wheeze- none, cough- none , dullness-none, rub- none  Chest wall-  Abd-  Br/ Gen/ Rectal- Not done, not  indicated Extrem- cyanosis- none, clubbing, none, atrophy- none, strength- nl Neuro- passive, responsive. Note she denied leg edema until I pointed it out.

## 2016-07-04 LAB — D-DIMER, QUANTITATIVE (NOT AT ARMC): D DIMER QUANT: 0.67 ug{FEU}/mL — AB (ref <0.50)

## 2016-07-05 ENCOUNTER — Other Ambulatory Visit: Payer: Self-pay | Admitting: Internal Medicine

## 2016-07-05 DIAGNOSIS — R7989 Other specified abnormal findings of blood chemistry: Secondary | ICD-10-CM

## 2016-07-06 ENCOUNTER — Ambulatory Visit (HOSPITAL_COMMUNITY)
Admission: RE | Admit: 2016-07-06 | Discharge: 2016-07-06 | Disposition: A | Discharge status: Home / Self Care | Payer: Medicare HMO | Source: Ambulatory Visit | Attending: Internal Medicine | Admitting: Internal Medicine

## 2016-07-06 DIAGNOSIS — R600 Localized edema: Secondary | ICD-10-CM | POA: Diagnosis not present

## 2016-07-06 DIAGNOSIS — Z8669 Personal history of other diseases of the nervous system and sense organs: Secondary | ICD-10-CM | POA: Insufficient documentation

## 2016-07-06 DIAGNOSIS — R7989 Other specified abnormal findings of blood chemistry: Secondary | ICD-10-CM

## 2016-07-06 NOTE — Progress Notes (Signed)
VASCULAR LAB PRELIMINARY  PRELIMINARY  PRELIMINARY  PRELIMINARY  Bilateral lower extremity venous duplex completed.    Preliminary report:  Bilateral:  No evidence of DVT, superficial thrombosis, or Baker's Cyst.   Kasin Tonkinson, RVS 07/06/2016, 11:01 AM

## 2016-07-10 DIAGNOSIS — R0902 Hypoxemia: Secondary | ICD-10-CM | POA: Diagnosis not present

## 2016-07-12 ENCOUNTER — Telehealth: Payer: Self-pay | Admitting: Internal Medicine

## 2016-07-12 NOTE — Telephone Encounter (Signed)
ATC [name]-patient's brother and care taker. Voicemail is full and no answer. Will need to try again later.

## 2016-07-17 ENCOUNTER — Encounter: Payer: Self-pay | Admitting: Neurology

## 2016-07-17 ENCOUNTER — Ambulatory Visit (INDEPENDENT_AMBULATORY_CARE_PROVIDER_SITE_OTHER): Payer: Medicare HMO | Admitting: Neurology

## 2016-07-17 VITALS — BP 124/76 | HR 68 | Temp 98.4°F | Resp 16 | Ht 59.0 in | Wt 176.3 lb

## 2016-07-17 DIAGNOSIS — Z85841 Personal history of malignant neoplasm of brain: Secondary | ICD-10-CM | POA: Diagnosis not present

## 2016-07-17 DIAGNOSIS — G40209 Localization-related (focal) (partial) symptomatic epilepsy and epileptic syndromes with complex partial seizures, not intractable, without status epilepticus: Secondary | ICD-10-CM | POA: Insufficient documentation

## 2016-07-17 MED ORDER — LEVETIRACETAM 750 MG PO TABS: 750.0000 mg | ORAL_TABLET | Freq: Two times a day (BID) | ORAL | 3 refills | Status: DC

## 2016-07-17 NOTE — Progress Notes (Signed)
Forwarded office notes to Dr. Diona Fanti and Dr. Amie Portland.

## 2016-07-17 NOTE — Progress Notes (Signed)
NEUROLOGY FOLLOW UP OFFICE NOTE  Audrey Turner [id number] June 06, 1960  HISTORY OF PRESENT ILLNESS: I had the pleasure of seeing Audrey Turner in follow-up in the neurology clinic on 07/17/2016.  The patient was last seen by one of my partners Dr. Carles Collet for seizures secondary to brain tumor s/p chemo/radiation and surgery. She is accompanied by her brother who helps supplement the history today.  Records and images were personally reviewed where available. I reviewed history and notes from Dr. Carles Collet as summarized below. In the interim, her brother reports she had one seizure last February with right arm jerking. 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 In the past, this improved, however her brother reports that since the second seizure, she has been unable to walk by herself. She feels her right leg is weak. She is also having some tremors on the right hand. She denies any focal numbness/tingling. They report that for the past 2 years, she has had urinary incontinence, she has no urge and does not know when she has to urinate. He was cleaning her this morning when she urinated on the floor. She takes oxybutinin which her brother feels is not helpful. Myrbetriq was cost-prohibitive, they may be discussing acupuncture with Dr. Diona Fanti on her next urology visit. Since the beginning of the year, she has also had no sense of bowel movements, she would not know when she had a BM. She denies any perineal numbness. She denies any neck or back pain. She underwent PT and OT but her brother feels this has not helped at all, she would forget what she learned and would not do the HEP. She continues to see her Neuropsychiatrist at [facility], and was told that her brain is not telling her to do things. She is taking Zoloft and Ability was added 8 months ago. Ritalin was making her  jumpy and nervous, she is doing better off it. Her brother recalls that after the surgery in 2008, she was "perfect" and was independent, but for the past 4 years, symptoms have been worsening to the point that he moved in with her and her 69 year old daughter in July 2017 because she was unable to take care of herself. She would have times where she stops walking and appears dazed, she had a 24-hour EEG which captured 2 of these episodes, no EEG changes seen. EEG showed focal left hemisphere slowing, no epileptiform discharges. 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 There was left frontal lobe gliosis surrounding the resection cavity, additional patchy white matter changes. There were subcentimeter T2 bright cysts in the left corona radiata and right parietal lobe, favoring post-treatment changes. Her brother reports she had a repeat MRI brain a few months ago that was unchanged, report and images unavailable for review.   HPI reviewed from Dr. Doristine Devoid note and per brother: This is a 56 year old right-handed woman with a history of left frontal astrocytoma s/p multiple resections.  Her first resection was in October, 2007, followed by a repeat in August, 2008.  She has been followed by [facility] for this. She is also being seen at [facility] by psychotherapy for frontal lobe  syndrome/cognitive dysfunction. Her brother reports she did well for 5 years post-surgery, then for since 2014, she has been declining, to the point that he had to move in with her in July 2017. She was in in her usual state of health on 12/09/2015 when she and her brother went to the mattress store (had done PT before that).   Upon exiting her vehicle her brother noted she suddenly stopped and could not move, then started having right  arm jerking.   Her brother states that her left arm shook some but not like the right. She was helped to the ground.  Her eyes rolled back and lasted about 3 minutes.  EMS was contacted and once shaking stopped and waking up, she had trouble moving the right arm.  By the time she got to the hospital, the R arm was stronger and back to baseline.   . Patient has no prior history of seizure.  She was started on Keppra 500 mg twice a day. After her first seizure, she did not follow-up with PT, her brother reported she does not follow PT advice. She has become mostly wheelchair-bound. She is alone during the day but brother lives with her at night.  Brother states that she does nothing but watch TV during the day.  Seems to get confused/dazed by 8pm.  Has EDS.  Hx of OSA but refused CPAP years ago, she was Dr. Annamaria Boots who noted nocturnal hypoxemia. Brother also reported day/night reversal.  Treatment History via review of [facility] records:  01/10/06: Craniotomy and debulking performed at outside hospital revealing anaplastic astrocytoma. This was followed by Temozolomide and radiation and then six months of Temozolomide with last cycle of Temozolomide initiated on 08/10/06.  09/30/06: Radiographic evidence of new rim enhancing lesion butting off of the original resection cavity consistent with progressive disease.  10/15/06: New patient evaluation at [facility] [place]. 10/17/06: Craniotomy with resection of tumor with Dr. Derrill Memo, and pathology confirming anaplastic astrocytoma.  11/19/06: Patient initiated on Zactima, Imatinib and Hydroxyurea per protocol.  01/20/07: Patient has completed two cycles on protocol utilizing Zactima, Gleevec, and Hydrea.  02/24/07: Patient is now status post three cycles of Zactima/Gleevec/Hydrea on protocol. She has had a dose reduction for cycle 3 with Zactima at 100 mg p.o. DAILY and Gleevec 300 mg p.o. DAILY due to problems with diarrhea.    03/24/07: Patient is status post four cycles of therapy on protocol with Zactima, Gleevec and Hydrea.  05/19/07: Patient is status post five cycles of Zactima, Gleevec, and Hydroxyurea on protocol; last dose on 04/18/07 and has been off therapy since that time secondary to requiring surgery on her craniotomy site. We will await recommendations from Dr. Beverely Low in Plastic Surgery prior to continuing with therapy. She is clinically and radiographically stable at this time.  09/22/07: Patient is status post four cycles of VP-16 x 21 days per 28-day cycle.  05/26/08: Status post twelve cycles of Etoposide; off therapy.    PAST MEDICAL HISTORY: Past Medical History:  Diagnosis Date  . Acute sinusitis, unspecified   . Astrocytoma [facility]) 01/2006 /09/2006   grade 3 atrocytoma s/p chemo/radiation/surgery at [place], in remission since at least 2011  . Cancer ([place])    Brain   . Diarrhea   . Drug induced neutropenia(288.03)   . Primary exertional headache   . Recurrent depression ([place])   . Vaginitis     MEDICATIONS:  Outpatient Encounter Prescriptions as of 07/17/2016  Medication Sig Note  . acetaminophen (TYLENOL) 500 MG tablet Take 500 mg by mouth as needed. 11/22/2014: Received from: [place]: Take by mouth.  . benzocaine-resorcinol (VAGISIL) 5-2 % vaginal cream as needed as needed 11/22/2014: Received from: [facility]  . levETIRAcetam (KEPPRA) 750 MG tablet Take 1 tablet (750 mg total) by mouth 2 (two) times daily.   Marland Kitchen loratadine-pseudoephedrine (CLARITIN-D 24 HOUR) 10-240 MG per 24 hr tablet Take 10-240 mg by mouth as needed. 11/22/2014: Received from: [place]: Take by mouth.  . Multiple Vitamin (MULTIVITAMIN) capsule Take 1 capsule by mouth daily.     Marland Kitchen oxybutynin (DITROPAN XL) 15 MG 24 hr tablet Take 15 mg by mouth at bedtime.   . sertraline (ZOLOFT) 100 MG tablet Take 2 tabs po qday 01/05/2016: Received from:  [facility]  . ARIPiprazole (ABILIFY) 5 MG tablet Take 5 mg by mouth daily. 04/17/2016: Received from: [place]: Take 1 tablet (5 mg total) by mouth once daily.   No facility-administered encounter medications on file as of 07/17/2016.      ALLERGIES: Allergies  Allergen Reactions  . Penicillins     REACTION: RASH  . Phenytoin     REACTION: for seizures, gave her a rash-(dilantin)  . Dilantin [Phenytoin Sodium Extended] Rash  . Penicillins Rash    FAMILY HISTORY: Family History  Problem Relation Age of Onset  . Hypertension Mother   . Hypertension Father   . COPD Father   . Sleep apnea Father   . Breast cancer Paternal Aunt     62's  . Brain cancer Paternal Aunt   . Ovarian cancer Maternal Aunt   . Colon cancer Neg Hx     SOCIAL HISTORY: Social History   Social History  . Marital status: Divorced    Spouse name: N/A  . Number of children: 3  . 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 Smoking status: Never Smoker  . Smokeless tobacco: Never Used  . Alcohol use No  . Drug use: No  . Sexual activity: No   Other Topics Concern  . Not on file   Social History Narrative   Divorced   From [place], [place]    REVIEW OF SYSTEMS: Constitutional: No fevers, chills, or sweats, no generalized fatigue, change in appetite Eyes: No visual changes, double vision, eye pain Ear, nose and throat: No hearing loss, ear pain, nasal congestion, sore throat Cardiovascular: No chest pain, palpitations Respiratory:  No shortness of breath at rest or with exertion, wheezes GastrointestinaI: No nausea, vomiting, diarrhea, abdominal pain, fecal incontinence Genitourinary:  No dysuria, urinary retention or frequency Musculoskeletal:  No neck pain, back pain Integumentary: No rash, pruritus, skin lesions Neurological: as above Psychiatric: No depression, insomnia,  anxiety Endocrine: No palpitations, fatigue, diaphoresis, mood swings, change in appetite, change in weight, increased thirst Hematologic/Lymphatic:  No anemia, purpura, petechiae. Allergic/Immunologic: no itchy/runny eyes, nasal congestion, recent allergic reactions, rashes  PHYSICAL EXAM: Vitals:   07/17/16 1045  BP: 124/76  Pulse: 68  Resp: 16  Temp: 98.4 F (36.9 C)   General: No acute distress, slow to respond but answers appropriately and follows 2-step commands Head:  s/p craniotomy, well-healed scar Neck: supple, no paraspinal tenderness, full range of motion Heart:  Regular rate and rhythm Lungs:  Clear to auscultation bilaterally Back: No  paraspinal tenderness Skin/Extremities: No rash, no edema Neurological Exam: alert and oriented x 3.  She is slow to respond but answers appropriately Cranial nerves: There is good facial symmetry but again note of facial hypomimia. The pupils are equal round and reactive to light bilaterally. Fundoscopic exam reveals clear disc margins bilaterally. Extraocular muscles are intact with mild upgaze paresis and visual fields are full to confrontational testing. Speech is fluent and clear but lacks spontaneity of speech. Soft palate rises symmetrically and there is no tongue deviation. Hearing is intact to conversational tone. Tone: Tone is good throughout. Sensation: Sensation is intact to light touch throughout Coordination:  The patient has no difficulty with RAM's or FNF bilaterally. Motor: Strength is 5/5 in the bilateral upper and lower extremities.  Shoulder shrug is equal and symmetric. There is no pronator drift.  There are no fasciculations noted. Gait and Station: The patient requires 2-person assistance to get out of her wheelchair. She did not have her walker, she is very slow and stiff, with short wide-based shuffling gait, needing repeated reminders to move each foot at a time.   IMPRESSION: This is a pleasant 56 year old woman  with left frontal astrocytoma s/p resection x 2, chemotherapy and radiation, with frontal lobe syndrome with cognitive impairment, who has had 2 focal seizures since September 2017 with right-sided shaking and post-ictal Todd's paralysis. Her brother reports Keppra dose was increased to 750mg  BID after the last seizure in February, she is tolerating higher dose without significant side effects. She continues to see her Neuropsychiatrist at [facility] for frontal lobe syndrome, and is being prescribed Zoloft and Abilify. Abilify may be contributing to gait difficulties, although her brother reported short shuffling gait even before starting medication. I am concerned about their report of urinary and bowel incontinence. The urinary incontinence has been ongoing for 2 years, she follows up with Urology and will set up another visit as her brother feels Ditropan is not helping. He now reports bowel incontinence since the beginning of the year. They have been told that these issues are due to her brain injury, would still rule out spinal cord structural abnormality with new onset of bowel symptoms. Motor strength is good, but she has significant difficulties walking and is mostly wheelchair-bound, no improvement with PT. She does not drive. Follow-up in 3 months, they know to call for any changes.   Thank you for allowing me to participate in her care.  Please do not hesitate to call for any questions or concerns.  The duration of this appointment visit was 50 minutes of face-to-face time with the patient.  Greater than 50% of this time was spent in counseling, explanation of diagnosis, planning of further management, and coordination of care.   Ellouise Newer, M.D.   CC: Dr. Damita Dunnings, Dr. Diona Fanti, Dr. Amie Portland

## 2016-07-17 NOTE — Patient Instructions (Signed)
1. Continue Keppra 750mg  twice a day 2. Call your Neuropsychiatrist and urologist for follow-up  3. Follow-up in 3 months, call for any changes  Seizure Precautions: 1. If medication has been prescribed for you to prevent seizures, take it exactly as directed.  Do not stop taking the medicine without talking to your doctor first, even if you have not had a seizure in a long time.   2. Avoid activities in which a seizure would cause danger to yourself or to others.  Don't operate dangerous machinery, swim alone, or climb in high or dangerous places, such as on ladders, roofs, or girders.  Do not drive unless your doctor says you may.  3. If you have any warning that you may have a seizure, lay down in a safe place where you can't hurt yourself.    4.  No driving for 6 months from last seizure, as per [facility].   Please refer to the following link on the [place] website for more information: http://[email or web address]/[email or web address]/drivingu.cfm   5.  Maintain good sleep hygiene. Avoid alcohol.  6.  Contact your doctor if you have any problems that may be related to the medicine you are taking.  7.  Call 911 and bring the patient back to the ED if:        A.  The seizure lasts longer than 5 minutes.       B.  The patient doesn't awaken shortly after the seizure  C.  The patient has new problems such as difficulty seeing, speaking or moving  D.  The patient was injured during the seizure  E.  The patient has a temperature over 102 F (39C)  F.  The patient vomited and now is having trouble breathing

## 2016-07-24 NOTE — Telephone Encounter (Signed)
Pt brother returning call a/b o2, please advise he can be reached @ [phone].Hillery Hunter

## 2016-08-03 DIAGNOSIS — J9610 Chronic respiratory failure, unspecified whether with hypoxia or hypercapnia: Secondary | ICD-10-CM | POA: Diagnosis not present

## 2016-09-02 DIAGNOSIS — J9610 Chronic respiratory failure, unspecified whether with hypoxia or hypercapnia: Secondary | ICD-10-CM | POA: Diagnosis not present

## 2016-09-10 ENCOUNTER — Encounter: Payer: Medicare HMO | Admitting: Internal Medicine

## 2016-09-10 NOTE — Progress Notes (Signed)
07/03/2016- Referred by Dr Tat; Sleep Study completed 02/2016. Pt not on CPAP with concern of sleep associated hypoxemia. She comes with her brother. PCP is Dr. [name]. She has a history of seizure leading to diagnosis of astrocytoma with multiple resection procedures in 2007 and 2008, then XRT/ chemo at [facility] where she was also followed by psychotherapy for cognitive dysfunction treated with Ritalin 15 mg twice daily. She had prior diagnosis of obstructive sleep apnea with NPSG 01/17/10- AHI 31/ hr, desaturation to 68%, body weight 202 lbs.  NPSG 02/26/16- AHI 3.7/hour (WNL) with mean oxygen saturation 93.4% and nadir 65%, moderate snoring, body weight 168 pounds. Brother confirms the significant weight loss which he attributes to now living with her and controlling her diet better. Previously she only ate sweets. He sleeps in a separate room but has a baby monitor and says she does not snore now. She says she wakes frequently during the night but does not need bathroom, is not dyspneic. Sleeps on 2 pillows. She is alone at home much of the day, sitting and watching TV. There is no history of ENT surgery or lung disease and she never smoked. She denies asthma, heart disease, history of DVT/thromboembolism. Office Spirometry 07/03/2016-unreliable, submaximal effort read as "moderate restriction". FVC 1.90/66%, FEV1 1.43/63%, ratio 0.75, FEF 25-75% 1.26/53%  56 year old female followed for OSA, complicated by cognitive dysfunction treated with Ritalin./4/18-56 year old woman followed for OSA, sleep associated hypoxemia, complicated by resection of astrocytoma/XRT/chemotherapy Patient had to leave without being seen and will be rescheduled.    ROS-see HPI   Negative unless "+" Constitutional:    weight loss, night sweats, fevers, chills, fatigue, lassitude. HEENT:    headaches, difficulty swallowing, tooth/dental problems, sore throat,       sneezing, itching, ear ache, nasal congestion, post  nasal drip, snoring CV:    chest pain, orthopnea, PND, swelling in lower extremities, anasarca,                                                           izziness, palpitations Resp:   shortness of breath with exertion or at rest.                productive cough,   non-productive cough, coughing up of blood.              change in color of mucus.  wheezing.   Skin:    rash or lesions. GI:  No-   heartburn, indigestion, abdominal pain, nausea, vomiting, diarrhea,                 change in bowel habits, loss of appetite GU: dysuria, change in color of urine, no urgency or frequency.   flank pain. MS:   joint pain, stiffness, decreased range of motion, back pain. Neuro-     HPI/ per Neurolgy Psych:  HPI  OBJ- Physical Exam General- Alert, Oriented, Affect-flat, Distress- none acute. Wheelchair. Room air resting saturation 99%+ Obese Skin- rash-none, lesions- none, excoriation- none Lymphadenopathy- none Head- atraumatic            Eyes- Gross vision intact, PERRLA, conjunctivae and secretions clear            Ears- Hearing, canals-normal            Nose- Clear, no-Septal dev,  mucus, polyps, erosion, perforation             Throat- Mallampati II , mucosa clear , drainage- none, tonsils- atrophic Neck- flexible , trachea midline, no stridor , thyroid nl, carotid no bruit Chest - symmetrical excursion , unlabored           Heart/CV- RRR , no murmur , no gallop  , no rub, nl s1 s2                           - JVD+ 1 , edema +2, stasis changes- none, varices- none           Lung- clear to P&A, wheeze- none, cough- none , dullness-none, rub- none           Chest wall-  Abd-  Br/ Gen/ Rectal- Not done, not indicated Extrem- cyanosis- none, clubbing, none, atrophy- none, strength- nl Neuro- passive, responsive. Note she denied leg edema until I pointed it out.

## 2016-09-19 ENCOUNTER — Encounter: Payer: Self-pay | Admitting: Internal Medicine

## 2016-09-19 ENCOUNTER — Ambulatory Visit (INDEPENDENT_AMBULATORY_CARE_PROVIDER_SITE_OTHER): Payer: Medicare HMO | Admitting: Internal Medicine

## 2016-09-19 DIAGNOSIS — G4734 Idiopathic sleep related nonobstructive alveolar hypoventilation: Secondary | ICD-10-CM

## 2016-09-19 DIAGNOSIS — G4733 Obstructive sleep apnea (adult) (pediatric): Secondary | ICD-10-CM | POA: Diagnosis not present

## 2016-09-19 NOTE — Progress Notes (Signed)
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  NPSG 02/26/16- AHI 3.7/hour (WNL) with mean oxygen saturation 93.4% and nadir 65%, moderate snoring, body weight 168 pounds. Office Spirometry 07/03/2016-unreliable, submaximal effort read as "moderate restriction". FVC 1.90/66%, FEV1 1.43/63%, ratio 0.75, FEF 25-75% 1.26/53%  -------------------------------------------------------------------------------------------------------------  07/03/2016- Referred by Dr Tat; Sleep Study completed 02/2016. Pt not on CPAP with concern of sleep associated hypoxemia. She comes with her brother. PCP is Dr. [name]. 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 She had prior diagnosis of obstructive sleep apnea with NPSG 01/17/10- AHI 31/ hr, desaturation to 68%, body weight 202 lbs.  NPSG 02/26/16- AHI 3.7/hour (WNL) with mean oxygen saturation 93.4% and nadir 65%, moderate snoring, body weight 168 pounds. Brother confirms the significant weight loss which he attributes to now living with her and controlling her diet better. Previously she only ate sweets. He sleeps in a separate room but has a baby monitor and says she does not snore now. She says she wakes frequently during the night but does not need bathroom, is not dyspneic. Sleeps on 2 pillows. She is alone at home much of the day, sitting and watching TV. There is no history of ENT surgery or lung disease and she never smoked. She denies asthma, heart disease, history of DVT/thromboembolism. Office Spirometry 07/03/2016-unreliable, submaximal effort read as  "moderate restriction". FVC 1.90/66%, FEV1 1.43/63%, ratio 0.75, FEF 25-75% 1.26/53%  09/19/16-56 year old female followed for sleep associated hypoxemia,, complicated by cognitive dysfunction treated with Ritalin.,  after resection of astrocytoma/XRT/chemotherapy 2 month follow for hypoxia. Was placed on O2 during the night. States breathing has been ok since on oxygen.  Overnight oximetry 07/05/16-qualified for sleep O2 Room air saturation today 95% She reports that with home oxygen at night she now sleeps to the night, much more restfully and without waking. Her brother says the noise of her breathing at night is much quieter and less labored as heard on the bedside monitor in his room. He also notices that she is no longer waking during the night to eat a snack, because she is sleeping through. She has begun to gain weight again and they both understand this might taper back and obstructive sleep apnea as noted on her first sleep study. CXR 07/03/16 IMPRESSION: Negative aside from mild nonspecific increased pulmonary interstitial markings, such is due to a degree of chronic pulmonary interstitial disease.   ROS-see HPI    "+"  = pos Constitutional:    weight loss, night sweats, fevers, chills, fatigue, lassitude. HEENT:    headaches, difficulty swallowing, tooth/dental problems, sore throat,       sneezing, itching, ear ache, nasal congestion, post nasal drip, snoring CV:    chest pain, orthopnea, PND, swelling in lower extremities, anasarca,                                                           izziness, palpitations Resp:   shortness of breath with exertion or  at rest.                productive cough,   non-productive cough, coughing up of blood.              change in color of mucus.  wheezing.   Skin:    rash or lesions. GI:  No-   heartburn, indigestion, abdominal pain, nausea, vomiting, diarrhea,                 change in bowel habits, loss of appetite GU: dysuria, change in  color of urine, no urgency or frequency.   flank pain. MS:   joint pain, stiffness, decreased range of motion, back pain. Neuro-     HPI/ per Neurolgy Psych:  HPI  OBJ- Physical Exam General- Alert, Oriented, Affect-flat, Distress- none acute. Wheelchair/ semi recumbent. + Obese Skin- rash-none, lesions- none, excoriation- none Lymphadenopathy- none Head- atraumatic            Eyes- Gross vision intact, PERRLA, conjunctivae and secretions clear            Ears- Hearing, canals-normal            Nose- Clear, no-Septal dev, mucus, polyps, erosion, perforation             Throat- Mallampati II , mucosa clear , drainage- none, tonsils- atrophic Neck- flexible , trachea midline, no stridor , thyroid nl, carotid no bruit Chest - symmetrical excursion , unlabored           Heart/CV- RRR , no murmur , no gallop  , no rub, nl s1 s2                           - JVD+ 1 , edema +1, stasis changes- none, varices- none           Lung- clear to P&A, wheeze- none, cough- none , dullness-none, rub- none           Chest wall-  Abd-  Br/ Gen/ Rectal- Not done, not indicated Extrem- cyanosis- none, clubbing, none, atrophy- none, strength- nl Neuro- passive, responsive.

## 2016-09-19 NOTE — Assessment & Plan Note (Signed)
Problem was in remission based on 2017 sleep study, after significant weight loss. Brother says her breathing is quiet had regular now with oxygen, no longer labored. There are encouraged not to let her weight creep back up.

## 2016-09-19 NOTE — Patient Instructions (Signed)
We can continue O2/ [employer] 2L during sleep  Please call as needed

## 2016-09-19 NOTE — Assessment & Plan Note (Signed)
Probably a combination of central hyperal ventilation and obesity hypoventilation. Quality of life is described by patient and brother has "much better" with home oxygen for sleep. Plan-continue  O2 2 L sleep/[employer]

## 2016-09-25 DIAGNOSIS — C711 Malignant neoplasm of frontal lobe: Secondary | ICD-10-CM | POA: Diagnosis not present

## 2016-09-25 DIAGNOSIS — F070 Personality change due to known physiological condition: Secondary | ICD-10-CM | POA: Diagnosis not present

## 2016-09-25 DIAGNOSIS — F09 Unspecified mental disorder due to known physiological condition: Secondary | ICD-10-CM | POA: Diagnosis not present

## 2016-09-25 DIAGNOSIS — F0789 Other personality and behavioral disorders due to known physiological condition: Secondary | ICD-10-CM | POA: Diagnosis not present

## 2016-09-25 DIAGNOSIS — F339 Major depressive disorder, recurrent, unspecified: Secondary | ICD-10-CM | POA: Diagnosis not present

## 2016-10-03 DIAGNOSIS — J9610 Chronic respiratory failure, unspecified whether with hypoxia or hypercapnia: Secondary | ICD-10-CM | POA: Diagnosis not present

## 2016-11-02 DIAGNOSIS — J9610 Chronic respiratory failure, unspecified whether with hypoxia or hypercapnia: Secondary | ICD-10-CM | POA: Diagnosis not present

## 2016-11-05 ENCOUNTER — Ambulatory Visit: Payer: Medicare HMO | Admitting: Neurology

## 2016-11-05 DIAGNOSIS — Z029 Encounter for administrative examinations, unspecified: Secondary | ICD-10-CM

## 2016-11-09 ENCOUNTER — Encounter: Payer: Self-pay | Admitting: Neurology

## 2016-11-30 IMAGING — CT CT HEAD CODE STROKE
3 of 4 series · 18 of 47 positions shown, 21 images · non-contrast
Comparison: Brain MRI 03/23/2015

ADDENDUM:
These results were called by telephone at the time of interpretation
on 12/09/2015 at [DATE] to Dr. Dorien, who verbally acknowledged
these results.
CLINICAL DATA: Code stroke. Right-sided weakness. Facial droop.
History of surgery for brain tumor.

EXAM:
CT HEAD WITHOUT CONTRAST
TECHNIQUE: Contiguous axial images were obtained from the base of the skull
through the vertex without intravenous contrast.

[Series 201: head w/o, idose (1) · axial · non-contrast · 0.40mm/px · z∈[+71,+191]mm · 12 of 28 slices shown, 15 images]
[im 2/28  brain]
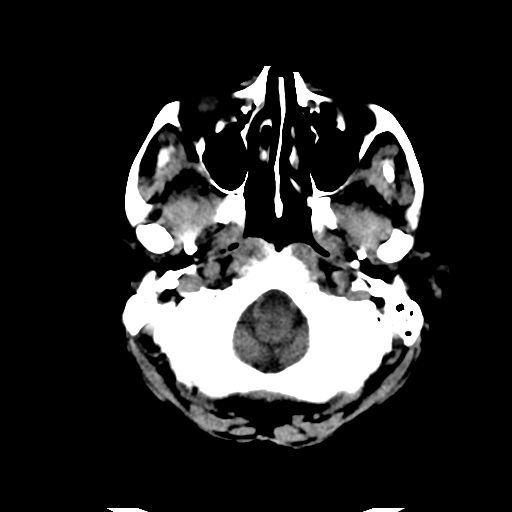
[im 2/28  bone]
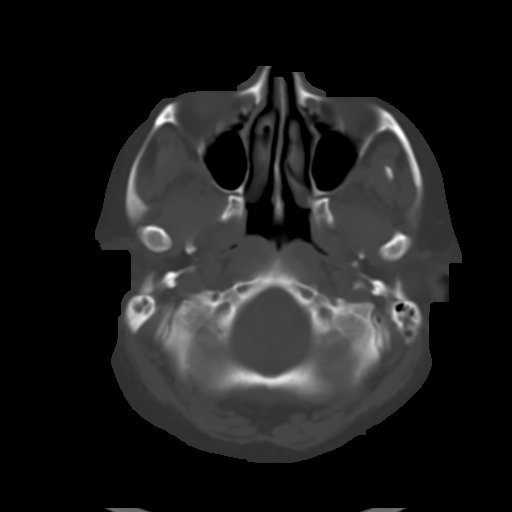
[im 4/28  brain]
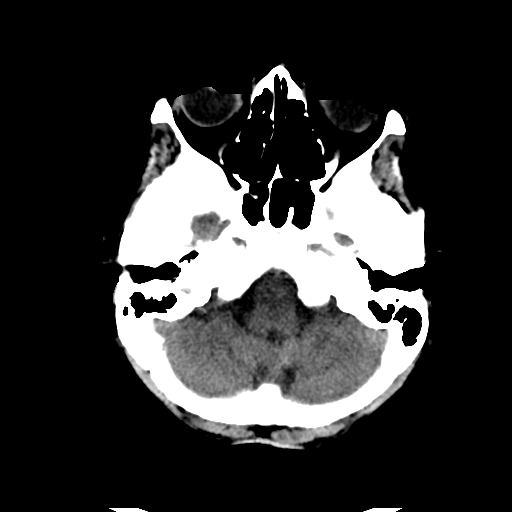
[im 6/28  brain]
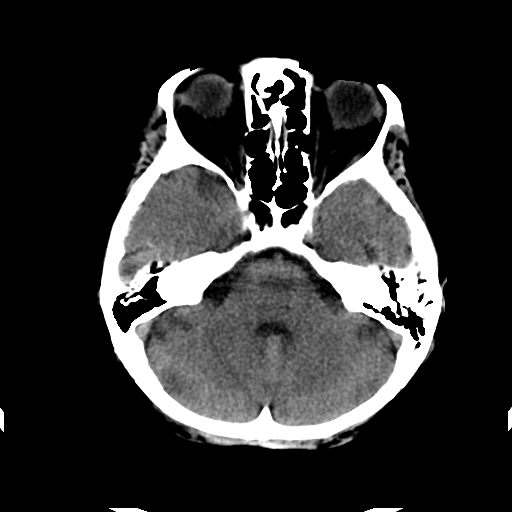
[im 8/28  brain]
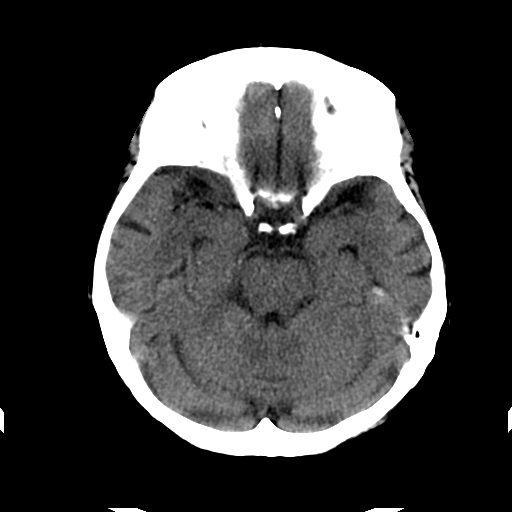
[im 10/28  brain]
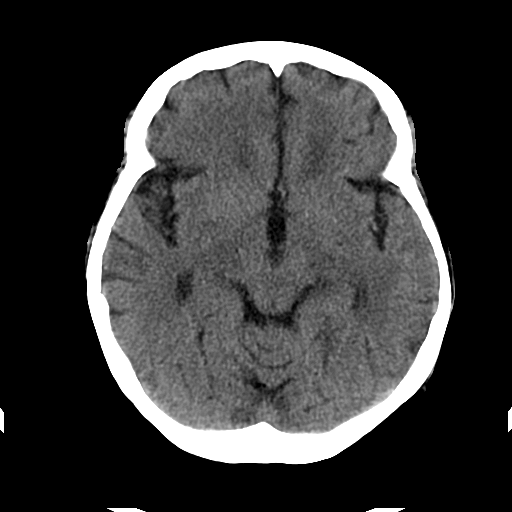
[im 10/28  bone]
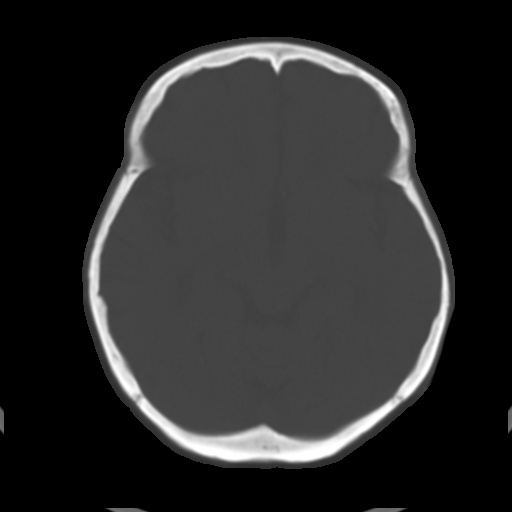
[im 12/28  brain]
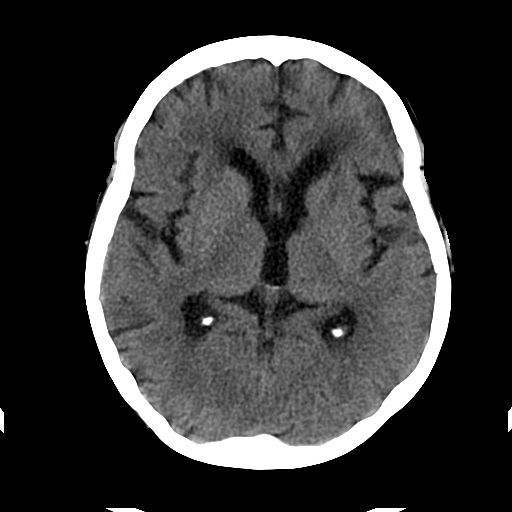
[im 16/28  brain]
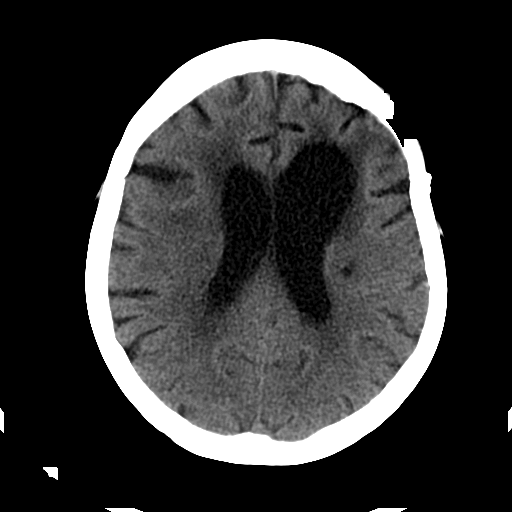
[im 18/28  brain]
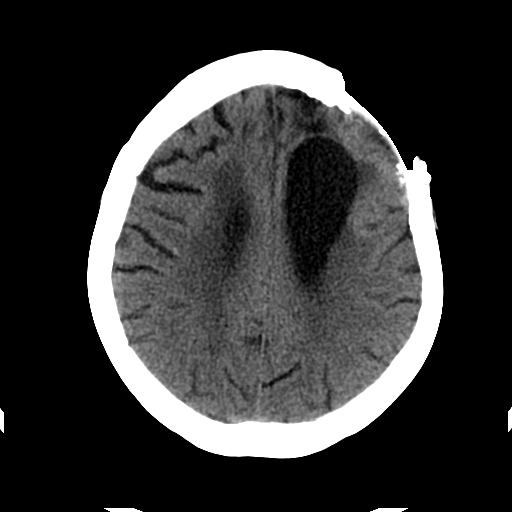
[im 20/28  brain]
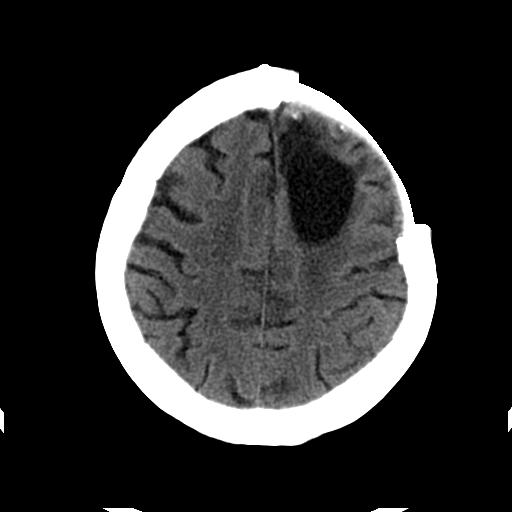
[im 20/28  bone]
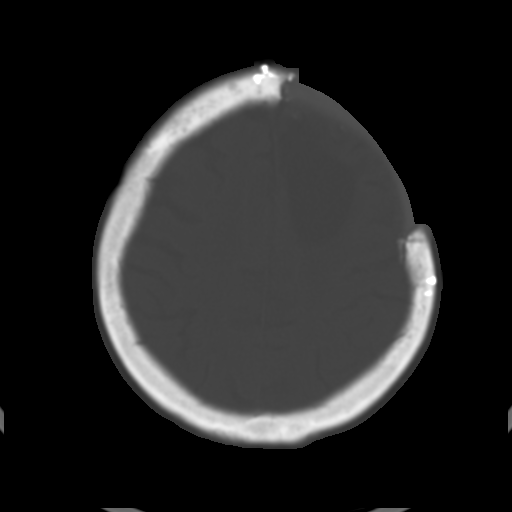
[im 22/28  brain]
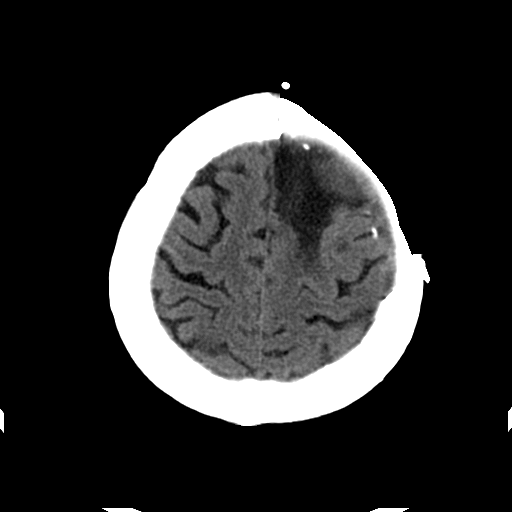
[im 24/28  brain]
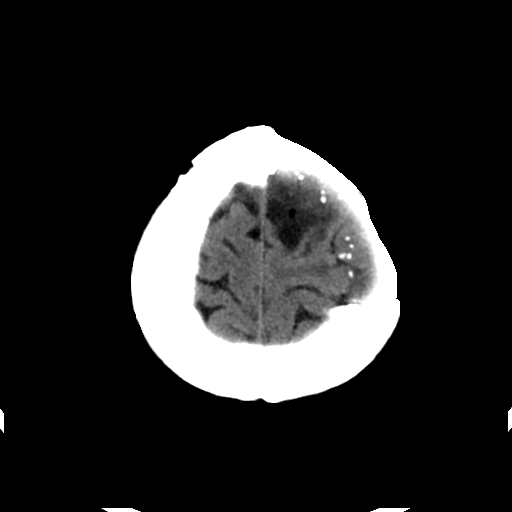
[im 26/28  brain]
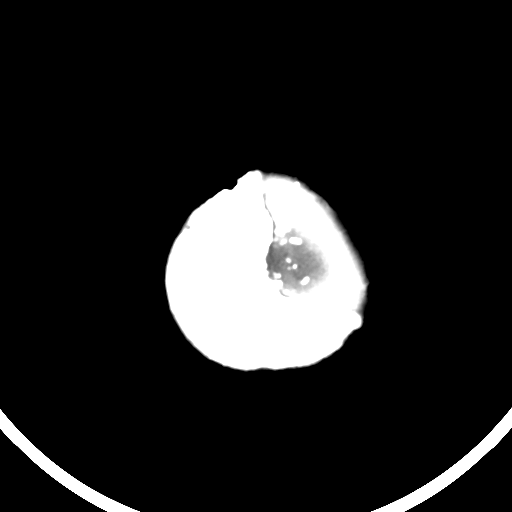

[Series 203: coronal st, idose (1) · coronal · 0.40mm/px · 3 of 66 slices shown]
[im 22/66  brain]
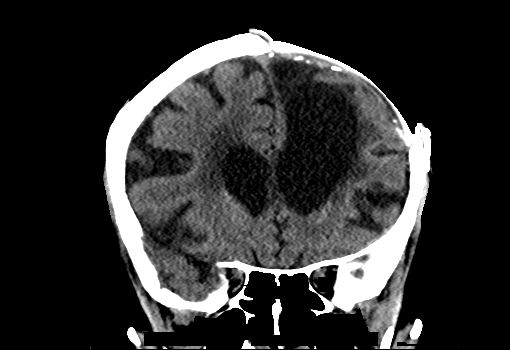
[im 29/66  brain]
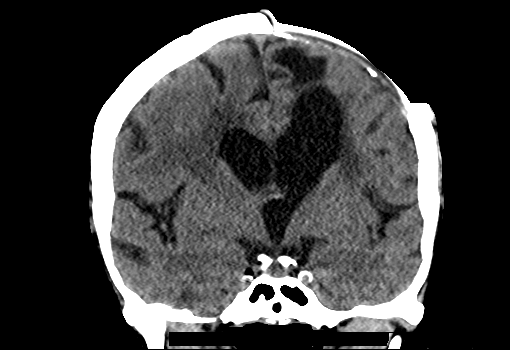
[im 37/66  brain]
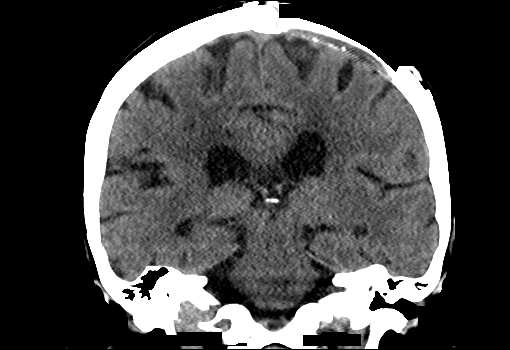

[Series 204: sagittal st, idose (1) · sagittal · 0.40mm/px · 3 of 68 slices shown]
[im 23/68  brain]
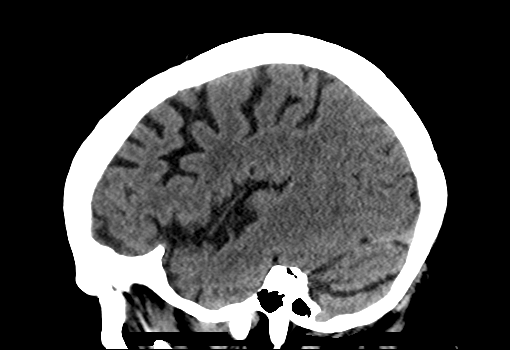
[im 34/68  brain]
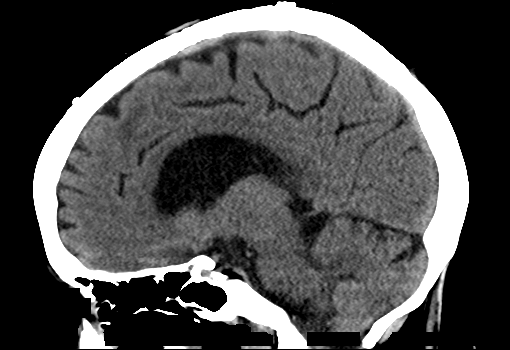
[im 45/68  brain]
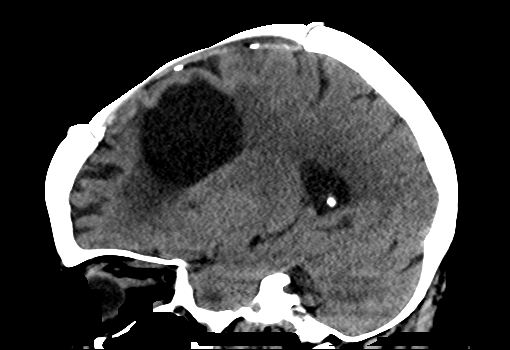

[18 of 47 positions shown; findings below may reference images not displayed]

FINDINGS: Sequelae of left frontal craniectomy are again identified with
underlying left frontal lobe encephalomalacia and left lateral
ventricle dilatation/porencephalic a. There is no evidence of acute
cortical infarct, intracranial hemorrhage, mass, midline shift, or
extra-axial fluid collection. Hypodensities in the cerebral white
matter are nonspecific but may reflect chronic small vessel ischemic
disease and/or post treatment changes. A chronic lacunar infarct in
the left corona radiata is unchanged.

Orbits are unremarkable. No acute osseous abnormality is identified.
The visualized paranasal sinuses and mastoid air cells are clear. No
hyperdense vessel.

ASPECTS (Alberta Stroke Program Early CT Score)

- Ganglionic level infarction (caudate, lentiform nuclei, internal
capsule, insula, M1-M3 cortex): 7

- Supraganglionic infarction (M4-M6 cortex): 3

Total score (0-10 with 10 being normal): 10
IMPRESSION: 1. No evidence of acute intracranial abnormality.
2. ASPECTS is 10.
3. Chronic post treatment changes with left frontal lobe
encephalomalacia.
The [REDACTED] has been paged.

## 2016-12-03 DIAGNOSIS — J9610 Chronic respiratory failure, unspecified whether with hypoxia or hypercapnia: Secondary | ICD-10-CM | POA: Diagnosis not present

## 2016-12-27 DIAGNOSIS — C711 Malignant neoplasm of frontal lobe: Secondary | ICD-10-CM | POA: Diagnosis not present

## 2016-12-27 DIAGNOSIS — F070 Personality change due to known physiological condition: Secondary | ICD-10-CM | POA: Diagnosis not present

## 2016-12-27 DIAGNOSIS — F339 Major depressive disorder, recurrent, unspecified: Secondary | ICD-10-CM | POA: Diagnosis not present

## 2016-12-27 DIAGNOSIS — F0789 Other personality and behavioral disorders due to known physiological condition: Secondary | ICD-10-CM | POA: Diagnosis not present

## 2016-12-27 DIAGNOSIS — F09 Unspecified mental disorder due to known physiological condition: Secondary | ICD-10-CM | POA: Diagnosis not present

## 2017-01-03 DIAGNOSIS — J9610 Chronic respiratory failure, unspecified whether with hypoxia or hypercapnia: Secondary | ICD-10-CM | POA: Diagnosis not present

## 2017-01-31 DIAGNOSIS — F0789 Other personality and behavioral disorders due to known physiological condition: Secondary | ICD-10-CM | POA: Diagnosis not present

## 2017-01-31 DIAGNOSIS — F09 Unspecified mental disorder due to known physiological condition: Secondary | ICD-10-CM | POA: Diagnosis not present

## 2017-01-31 DIAGNOSIS — F339 Major depressive disorder, recurrent, unspecified: Secondary | ICD-10-CM | POA: Diagnosis not present

## 2017-01-31 DIAGNOSIS — F070 Personality change due to known physiological condition: Secondary | ICD-10-CM | POA: Diagnosis not present

## 2017-01-31 DIAGNOSIS — C711 Malignant neoplasm of frontal lobe: Secondary | ICD-10-CM | POA: Diagnosis not present

## 2017-02-02 DIAGNOSIS — J9610 Chronic respiratory failure, unspecified whether with hypoxia or hypercapnia: Secondary | ICD-10-CM | POA: Diagnosis not present

## 2017-03-05 DIAGNOSIS — F070 Personality change due to known physiological condition: Secondary | ICD-10-CM | POA: Diagnosis not present

## 2017-03-05 DIAGNOSIS — F339 Major depressive disorder, recurrent, unspecified: Secondary | ICD-10-CM | POA: Diagnosis not present

## 2017-03-05 DIAGNOSIS — F09 Unspecified mental disorder due to known physiological condition: Secondary | ICD-10-CM | POA: Diagnosis not present

## 2017-03-05 DIAGNOSIS — F0789 Other personality and behavioral disorders due to known physiological condition: Secondary | ICD-10-CM | POA: Diagnosis not present

## 2017-03-05 DIAGNOSIS — J9610 Chronic respiratory failure, unspecified whether with hypoxia or hypercapnia: Secondary | ICD-10-CM | POA: Diagnosis not present

## 2017-03-05 DIAGNOSIS — C711 Malignant neoplasm of frontal lobe: Secondary | ICD-10-CM | POA: Diagnosis not present

## 2017-04-04 DIAGNOSIS — J9610 Chronic respiratory failure, unspecified whether with hypoxia or hypercapnia: Secondary | ICD-10-CM | POA: Diagnosis not present

## 2017-04-29 ENCOUNTER — Other Ambulatory Visit: Payer: Self-pay | Admitting: Family Medicine

## 2017-04-29 DIAGNOSIS — R739 Hyperglycemia, unspecified: Secondary | ICD-10-CM

## 2017-05-03 ENCOUNTER — Ambulatory Visit (INDEPENDENT_AMBULATORY_CARE_PROVIDER_SITE_OTHER): Payer: Medicare HMO

## 2017-05-03 VITALS — BP 110/78 | HR 60 | Temp 97.5°F | Wt 189.5 lb

## 2017-05-03 DIAGNOSIS — R739 Hyperglycemia, unspecified: Secondary | ICD-10-CM

## 2017-05-03 DIAGNOSIS — Z0000 Encounter for general adult medical examination without abnormal findings: Secondary | ICD-10-CM | POA: Diagnosis not present

## 2017-05-03 LAB — COMPREHENSIVE METABOLIC PANEL
ALK PHOS: 78 U/L (ref 39–117)
ALT: 20 U/L (ref 0–35)
AST: 21 U/L (ref 0–37)
Albumin: 3.8 g/dL (ref 3.5–5.2)
BILIRUBIN TOTAL: 0.5 mg/dL (ref 0.2–1.2)
BUN: 14 mg/dL (ref 6–23)
CALCIUM: 9.3 mg/dL (ref 8.4–10.5)
CO2: 33 mEq/L — ABNORMAL HIGH (ref 19–32)
Chloride: 100 mEq/L (ref 96–112)
Creatinine, Ser: 0.73 mg/dL (ref 0.40–1.20)
GFR: 88.16 mL/min (ref >60.00)
GLUCOSE: 114 mg/dL — AB (ref 70–99)
Potassium: 4.2 mEq/L (ref 3.5–5.1)
Sodium: 138 mEq/L (ref 135–145)
TOTAL PROTEIN: 7 g/dL (ref 6.0–8.3)

## 2017-05-03 LAB — LDL CHOLESTEROL, DIRECT: Direct LDL: 55 mg/dL

## 2017-05-03 LAB — HEMOGLOBIN A1C: HEMOGLOBIN A1C: 5.4 % (ref 4.6–6.5)

## 2017-05-03 LAB — CBC WITH DIFFERENTIAL/PLATELET
Basophils Absolute: 0 10*3/uL (ref 0.0–0.1)
Basophils Relative: 0.6 % (ref 0.0–3.0)
Eosinophils Absolute: 0.2 10*3/uL (ref 0.0–0.7)
Eosinophils Relative: 2.4 % (ref 0.0–5.0)
HEMATOCRIT: 39.1 % (ref 36.0–46.0)
HEMOGLOBIN: 13.2 g/dL (ref 12.0–15.0)
LYMPHS PCT: 35.7 % (ref 12.0–46.0)
Lymphs Abs: 2.7 10*3/uL (ref 0.7–4.0)
MCHC: 33.7 g/dL (ref 30.0–36.0)
MCV: 77.6 fl — AB (ref 78.0–100.0)
MONOS PCT: 5.5 % (ref 3.0–12.0)
Monocytes Absolute: 0.4 10*3/uL (ref 0.1–1.0)
NEUTROS PCT: 55.8 % (ref 43.0–77.0)
Neutro Abs: 4.2 10*3/uL (ref 1.4–7.7)
Platelets: 318 10*3/uL (ref 150.0–400.0)
RBC: 5.04 Mil/uL (ref 3.87–5.11)
RDW: 16 % — ABNORMAL HIGH (ref 11.5–15.5)
WBC: 7.5 10*3/uL (ref 4.0–10.5)

## 2017-05-03 LAB — LIPID PANEL
CHOL/HDL RATIO: 6
Cholesterol: 194 mg/dL (ref 0–200)
HDL: 33.2 mg/dL — AB (ref >39.00)
NonHDL: 161.01
Triglycerides: 351 mg/dL — ABNORMAL HIGH (ref 0.0–149.0)
VLDL: 70.2 mg/dL — AB (ref 0.0–40.0)

## 2017-05-03 NOTE — Patient Instructions (Signed)
Ms. Weismann , Thank you for taking time to come for your Medicare Wellness Visit. I appreciate your ongoing commitment to your health goals. Please review the following plan we discussed and let me know if I can assist you in the future.   These are the goals we discussed: Goals    . Follow up with Primary Care Provider     Starting 05/03/2017, I will continue to take medications as prescribed and to keep appointments with PCP as scheduled.        This is a list of the screening recommended for you and due dates:  Health Maintenance  Topic Date Due  . Flu Shot  07/03/2017*  . Mammogram  05/03/2018*  . Pap Smear  05/03/2018*  . Tetanus Vaccine  03/13/2023  . Colon Cancer Screening  06/03/2023  .  Hepatitis C: One time screening is recommended by Center for Disease Control  (CDC) for  adults born from [birth year] through 1963.   Completed  . HIV Screening  Completed  *Topic was postponed. The date shown is not the original due date.   Preventive Care for Adults  A healthy lifestyle and preventive care can promote health and wellness. Preventive health guidelines for adults include the following key practices.  . A routine yearly physical is a good way to check with your health care provider about your health and preventive screening. It is a chance to share any concerns and updates on your health and to receive a thorough exam.  . Visit your dentist for a routine exam and preventive care every 6 months. Brush your teeth twice a day and floss once a day. Good oral hygiene prevents tooth decay and gum disease.  . The frequency of eye exams is based on your age, health, family medical history, use  of contact lenses, and other factors. Follow your health care provider's recommendations for frequency of eye exams.  . Eat a healthy diet. Foods like vegetables, fruits, whole grains, low-fat dairy products, and lean protein foods contain the nutrients you need without too many calories. Decrease  your intake of foods high in solid fats, added sugars, and salt. Eat the right amount of calories for you. Get information about a proper diet from your health care provider, if necessary.  . Regular physical exercise is one of the most important things you can do for your health. Most adults should get at least 150 minutes of moderate-intensity exercise (any activity that increases your heart rate and causes you to sweat) each week. In addition, most adults need muscle-strengthening exercises on 2 or more days a week.  Silver Sneakers may be a benefit available to you. To determine eligibility, you may visit the website: www.silversneakers.com or contact program at (559)689-4148 Mon-Fri between 8AM-8PM.   . Maintain a healthy weight. The body mass index (BMI) is a screening tool to identify possible weight problems. It provides an estimate of body fat based on height and weight. Your health care provider can find your BMI and can help you achieve or maintain a healthy weight.   For adults 20 years and older: ? A BMI below 18.5 is considered underweight. ? A BMI of 18.5 to 24.9 is normal. ? A BMI of 25 to 29.9 is considered overweight. ? A BMI of 30 and above is considered obese.   . Maintain normal blood lipids and cholesterol levels by exercising and minimizing your intake of saturated fat. Eat a balanced diet with plenty of fruit and vegetables.  Blood tests for lipids and cholesterol should begin at age 44 and be repeated every 5 years. If your lipid or cholesterol levels are high, you are over 50, or you are at high risk for heart disease, you may need your cholesterol levels checked more frequently. Ongoing high lipid and cholesterol levels should be treated with medicines if diet and exercise are not working.  . If you smoke, find out from your health care provider how to quit. If you do not use tobacco, please do not start.  . If you choose to drink alcohol, please do not consume more than  2 drinks per day. One drink is considered to be 12 ounces (355 mL) of beer, 5 ounces (148 mL) of wine, or 1.5 ounces (44 mL) of liquor.  . If you are 32-67 years old, ask your health care provider if you should take aspirin to prevent strokes.  . Use sunscreen. Apply sunscreen liberally and repeatedly throughout the day. You should seek shade when your shadow is shorter than you. Protect yourself by wearing long sleeves, pants, a wide-brimmed hat, and sunglasses year round, whenever you are outdoors.  . Once a month, do a whole body skin exam, using a mirror to look at the skin on your back. Tell your health care provider of new moles, moles that have irregular borders, moles that are larger than a pencil eraser, or moles that have changed in shape or color.

## 2017-05-03 NOTE — Progress Notes (Signed)
PCP notes:   Health maintenance:  Flu vaccine - postponed Mammogram - postponed PAP smear - postponed  Abnormal screenings:   Fall risk - please read below Fall Risk  05/03/2017 07/17/2016 04/17/2016 01/05/2016  Falls in the past year? Yes Yes Yes Yes  Comment 3 assisted falls; caregivers allowed patient to slide down to floor - - -  Number falls in past yr: 2 or more 1 2 or more 2 or more  Injury with Fall? No No No No  Risk Factor Category  - - High Fall Risk High Fall Risk  Risk for fall due to : Impaired mobility;Impaired balance/gait;Mental status change - - -  Follow up - - Falls evaluation completed Falls evaluation completed    Depression score: 9 Depression screen PHQ 2/9 05/03/2017  Decreased Interest 0  Down, Depressed, Hopeless 3  PHQ - 2 Score 3  Altered sleeping 0  Tired, decreased energy 3  Change in appetite 0  Feeling bad or failure about yourself  3  Trouble concentrating 0  Moving slowly or fidgety/restless 0  Suicidal thoughts 0  PHQ-9 Score 9  Difficult doing work/chores Not difficult at all   Mini-Cog score: 18/20 MMSE - Mini Mental State Exam 05/03/2017  Orientation to time 5  Orientation to Place 5  Registration 3  Attention/ Calculation 0  Recall 1  Recall-comments unable to recall 2 of 3 words  Language- name 2 objects 0  Language- repeat 1  Language- follow 3 step command 3  Language- read & follow direction 0  Write a sentence 0  Copy design 0  Total score 18    Hearing-failed  Hearing Screening   125Hz  250Hz  500Hz  1000Hz  2000Hz  3000Hz  4000Hz  6000Hz  8000Hz   Right ear:   40 40 40  0    Left ear:   40 40 40  40       Patient concerns:   None  Nurse concerns:  None  Next PCP appt:   05/10/2017 @ 1215  I reviewed health advisor's note, was available for consultation on the day of service listed in this note, and agree with documentation and plan. [name], MD.

## 2017-05-03 NOTE — Progress Notes (Signed)
Pre visit review using our clinic review tool, if applicable. No additional management support is needed unless otherwise documented below in the visit note. 

## 2017-05-03 NOTE — Progress Notes (Signed)
Subjective:   Audrey Turner is a 57 y.o. female who presents for Medicare Annual (Subsequent) preventive examination.  Review of Systems:  N/A Cardiac Risk Factors include: obesity (BMI >30kg/m2)     Objective:     Vitals: BP 110/78 (BP Location: Left Arm, Patient Position: Sitting, Cuff Size: Large)   Pulse 60   Temp (!) 97.5 F (36.4 C) (Oral)   Wt 189 lb 8 oz (86 kg)   SpO2 96%   BMI 38.27 kg/m   Body mass index is 38.27 kg/m.  Advanced Directives 05/03/2017 02/26/2016 12/09/2015 11/16/2015 10/14/2014  Does Patient Have a Medical Advance Directive? Yes No No Yes Yes  Type of Paramedic of [place];Living will - - Healthcare Power of [place]  Does patient want to make changes to medical advance directive? - - - - No - Patient declined  Copy of [place] in Chart? No - copy requested - - No - copy requested No - copy requested  Would patient like information on creating a medical advance directive? - Yes - Educational materials given No - patient declined information - -    Tobacco Social History   Tobacco Use  Smoking Status Never Smoker  Smokeless Tobacco Never Used     Counseling given: No   Clinical Intake:  Pre-visit preparation completed: Yes  Pain : No/denies pain Pain Score: 0-No pain     Nutritional Status: BMI > 30  Obese Nutritional Risks: None Diabetes: No  How often do you need to have someone help you when you read instructions, pamphlets, or other written materials from your doctor or pharmacy?: 2 - Rarely What is the last grade level you completed in school?: BS  Interpreter Needed?: No  Comments: pt and daughter live together Information entered by :: LPinson, LPN  Past Medical History:  Diagnosis Date  . Acute sinusitis, unspecified   . Astrocytoma [facility]) 01/2006 /09/2006   grade 3 atrocytoma s/p chemo/radiation/surgery at [place], in remission since at least 2011   . Cancer ([place])    Brain   . Diarrhea   . Drug induced neutropenia(288.03)   . Primary exertional headache   . Recurrent depression ([place])   . Vaginitis    Past Surgical History:  Procedure Laterality Date  . [facility].  . d&c miscarriage  1993  . left frontal craniotomy w/ left subtotal resection of left frontal brain tumor  01/10/2006  . [facility] brain tumor surgery  01/13/2006  . NSVD x 3 - premie at 22 wks    . resection residual tumor  10/08/2006   Dr. Tommi Rumps: [facility]  . skull surg  2009/2010/2012   3 times past brain surg to close up opening   Family History  Problem Relation Age of Onset  . Hypertension Mother   . Hypertension Father   . COPD Father   . Sleep apnea Father   . Breast cancer Paternal Aunt        66's  . Brain cancer Paternal Aunt   . Ovarian cancer Maternal Aunt   . Colon cancer Neg Hx    Social History   Socioeconomic History  . Marital status: Divorced    Spouse name: None  . Number of children: 3  . Years of education: None  . Highest education level: None  Social Needs  . Financial resource strain: None  . Food insecurity - worry: None  . Food  insecurity - inability: None  . Transportation needs - medical: None  . Transportation needs - non-medical: None  Occupational History  . Occupation: stay at home mother  Tobacco Use  . Smoking status: Never Smoker  . Smokeless tobacco: Never Used  Substance and Sexual Activity  . Alcohol use: No  . Drug use: No  . Sexual activity: No    Birth control/protection: None  Other Topics Concern  . None  Social History Narrative   Divorced   From [place], [place]    Outpatient Encounter Medications as of 05/03/2017  Medication Sig  . acetaminophen (TYLENOL) 500 MG tablet Take 500 mg by mouth as needed.  . Amantadine HCl 100 MG tablet Take 1 tab qam and 1 tab qnoon  . benzocaine-resorcinol (VAGISIL) 5-2 % vaginal cream as needed as needed  . levETIRAcetam (KEPPRA) 750 MG tablet  Take 1 tablet (750 mg total) by mouth 2 (two) times daily.  Marland Kitchen loratadine-pseudoephedrine (CLARITIN-D 24 HOUR) 10-240 MG per 24 hr tablet Take 10-240 mg by mouth as needed.  . Multiple Vitamin (MULTIVITAMIN) capsule Take 1 capsule by mouth daily.    . sertraline (ZOLOFT) 100 MG tablet Take 2 tabs po qday  . ARIPiprazole (ABILIFY) 5 MG tablet Take 5 mg by mouth daily.  . [DISCONTINUED] oxybutynin (DITROPAN XL) 15 MG 24 hr tablet Take 15 mg by mouth at bedtime.   No facility-administered encounter medications on file as of 05/03/2017.     Activities of Daily Living In your present state of health, do you have any difficulty performing the following activities: 05/03/2017  Hearing? N  Vision? N  Difficulty concentrating or making decisions? Y  Walking or climbing stairs? Y  Dressing or bathing? Y  Doing errands, shopping? Y  Preparing Food and eating ? Y  Using the Toilet? Y  In the past six months, have you accidently leaked urine? Y  Do you have problems with loss of bowel control? Y  Managing your Medications? Y  Managing your Finances? Y  Housekeeping or managing your Housekeeping? Y  Some recent data might be hidden    Patient Care Team: Tonia Ghent, MD as PCP - General (Family Medicine) Ranell Patrick, MD as Referring Physician (Psychiatry) Thelma Comp, [place] as Referring Physician (Optometry) Encarnacion Slates, MD as Referring Physician (Neurology) Phineas Real Belinda Block, MD as Consulting Physician (Gynecology) Henreitta Leber, DDS as Referring Physician (Dentistry)    Assessment:   This is a routine wellness examination for [place].   Hearing Screening   125Hz  250Hz  500Hz  1000Hz  2000Hz  3000Hz  4000Hz  6000Hz  8000Hz   Right ear:   40 40 40  0    Left ear:   40 40 40  40    Vision Screening Comments: Last vision exam in Feb 2018 with Dr. Maryruth Hancock B.     Exercise Activities and Dietary recommendations Current Exercise Habits: Home exercise routine, Type of exercise:  stretching, Time (Minutes): 20, Intensity: Mild, Exercise limited by: orthopedic condition(s);neurologic condition(s)  Goals    . Follow up with Primary Care Provider     Starting 05/03/2017, I will continue to take medications as prescribed and to keep appointments with PCP as scheduled.        Fall Risk Fall Risk  05/03/2017 07/17/2016 04/17/2016 01/05/2016  Falls in the past year? Yes Yes Yes Yes  Comment 3 assisted falls; caregivers allowed patient to slide down to floor - - -  Number falls in past yr: 2 or more 1 2 or more 2  or more  Injury with Fall? No No No No  Risk Factor Category  - - High Fall Risk High Fall Risk  Risk for fall due to : Impaired mobility;Impaired balance/gait;Mental status change - - -  Follow up - - Falls evaluation completed Falls evaluation completed   Depression Screen PHQ 2/9 Scores 05/03/2017  PHQ - 2 Score 3  PHQ- 9 Score 9     Cognitive Function MMSE - Mini Mental State Exam 05/03/2017  Orientation to time 5  Orientation to Place 5  Registration 3  Attention/ Calculation 0  Recall 1  Recall-comments unable to recall 2 of 3 words  Language- name 2 objects 0  Language- repeat 1  Language- follow 3 step command 3  Language- read & follow direction 0  Write a sentence 0  Copy design 0  Total score 18     PLEASE NOTE: A Mini-Cog screen was completed. Maximum score is 20. A value of 0 denotes this part of [place] MMSE was not completed or the patient failed this part of the Mini-Cog screening.   Mini-Cog Screening Orientation to Time - Max 5 pts Orientation to Place - Max 5 pts Registration - Max 3 pts Recall - Max 3 pts Language Repeat - Max 1 pts Language Follow 3 Step Command - Max 3 pts   Immunization History  Administered Date(s) Administered  . Influenza,inj,Quad PF,6+ Mos 03/12/2013  . Influenza-Unspecified 04/03/2013  . Td 09/17/2002  . Tdap 03/12/2013   Screening Tests Health Maintenance  Topic Date Due  . INFLUENZA VACCINE   07/03/2017 (Originally 11/07/2016)  . MAMMOGRAM  05/03/2018 (Originally 08/21/2014)  . PAP SMEAR  05/03/2018 (Originally 02/27/2016)  . TETANUS/TDAP  03/13/2023  . COLONOSCOPY  06/03/2023  . Hepatitis C Screening  Completed  . HIV Screening  Completed       Plan:     I have personally reviewed, addressed, and noted the following in the patient's chart:  A. Medical and social history B. Use of alcohol, tobacco or illicit drugs  C. Current medications and supplements D. Functional ability and status E.  Nutritional status F.  Physical activity G. Advance directives H. List of other physicians I.  Hospitalizations, surgeries, and ER visits in previous 12 months J.  [place] to include hearing, vision, cognitive, depression L. Referrals and appointments - none  In addition, I have reviewed and discussed with patient certain preventive protocols, quality metrics, and best practice recommendations. A written personalized care plan for preventive services as well as general preventive health recommendations were provided to patient.  See attached scanned questionnaire for additional information.   Signed,   Lindell Noe, MHA, BS, LPN Health Coach

## 2017-05-05 DIAGNOSIS — J9610 Chronic respiratory failure, unspecified whether with hypoxia or hypercapnia: Secondary | ICD-10-CM | POA: Diagnosis not present

## 2017-05-07 DIAGNOSIS — F339 Major depressive disorder, recurrent, unspecified: Secondary | ICD-10-CM | POA: Diagnosis not present

## 2017-05-07 DIAGNOSIS — R69 Illness, unspecified: Secondary | ICD-10-CM | POA: Diagnosis not present

## 2017-05-07 DIAGNOSIS — F0789 Other personality and behavioral disorders due to known physiological condition: Secondary | ICD-10-CM | POA: Diagnosis not present

## 2017-05-07 DIAGNOSIS — F09 Unspecified mental disorder due to known physiological condition: Secondary | ICD-10-CM | POA: Diagnosis not present

## 2017-05-07 DIAGNOSIS — C711 Malignant neoplasm of frontal lobe: Secondary | ICD-10-CM | POA: Diagnosis not present

## 2017-05-10 ENCOUNTER — Encounter: Payer: Self-pay | Admitting: Family Medicine

## 2017-05-10 ENCOUNTER — Ambulatory Visit (INDEPENDENT_AMBULATORY_CARE_PROVIDER_SITE_OTHER): Payer: Medicare HMO | Admitting: Family Medicine

## 2017-05-10 VITALS — BP 110/78 | HR 60 | Temp 97.5°F | Ht 59.0 in | Wt 189.5 lb

## 2017-05-10 DIAGNOSIS — E785 Hyperlipidemia, unspecified: Secondary | ICD-10-CM | POA: Diagnosis not present

## 2017-05-10 DIAGNOSIS — Z23 Encounter for immunization: Secondary | ICD-10-CM | POA: Diagnosis not present

## 2017-05-10 DIAGNOSIS — G4734 Idiopathic sleep related nonobstructive alveolar hypoventilation: Secondary | ICD-10-CM

## 2017-05-10 DIAGNOSIS — R29898 Other symptoms and signs involving the musculoskeletal system: Secondary | ICD-10-CM

## 2017-05-10 DIAGNOSIS — Z7189 Other specified counseling: Secondary | ICD-10-CM

## 2017-05-10 DIAGNOSIS — F339 Major depressive disorder, recurrent, unspecified: Secondary | ICD-10-CM

## 2017-05-10 DIAGNOSIS — R569 Unspecified convulsions: Secondary | ICD-10-CM

## 2017-05-10 DIAGNOSIS — Z0000 Encounter for general adult medical examination without abnormal findings: Secondary | ICD-10-CM

## 2017-05-10 MED ORDER — NYSTATIN 100000 UNIT/GM EX POWD: Freq: Three times a day (TID) | CUTANEOUS | 2 refills | Status: DC

## 2017-05-10 NOTE — Patient Instructions (Addendum)
You can call for a mammogram at [facility] [phone]  Look at heart.org for recipes.   I'll check on PT and I will talk to neuro.  You can use nystatin powder on the rash.   Take care.  Glad to see you.

## 2017-05-10 NOTE — Progress Notes (Signed)
Flu vaccine - today.   Mammogram - d/w pt.  See AVS.  PAP smear - done 11/14 and up to date as of today, d/w pt.  Reasonable for 5 year f/u if desired by patient but not due now.   Colonoscopy up to date.  Living will d/w pt.  Brother [name] [place] designated if patient were incapacitated.    Fall risk - d/w pt.  She can walk a few steps slowly, with supervision and help.  Can transfer but not ambulate far.  She has done PT intermittently prev but w/o sig benefit- she likely needs more intensive PT, not just twice a week.    Depression score: 9, followed by [facility] clinic.  I'll defer, d/w pt and brother.  She and brother agree.    Mini-Cog score: 18/20.  She has short attention span and is followed by neuropsych at [facility].  She is on amantadine for motivation and concentration per psych clinic.    Hearing-failed but not needing hearing aids per patient report.   HLD.  D/w pt about labs.  TG elevation noted, with plan for weight reduction with diet and PT.  Recent labs d/w pt.    SZ per neuro, I'll defer.  No SZ in the last 9 months.   Seeing pulmonary re: noctural hypoxemia. On O2 at night.  I'll defer.    PMH and SH reviewed  ROS: Per HPI unless specifically indicated in ROS section   Meds, vitals, and allergies reviewed.   GEN: nad, alert and oriented but flat affect.  In wheelchair HEENT: mucous membranes moist NECK: supple w/o LA CV: rrr.  no murmur PULM: ctab, no inc wob ABD: soft, +bs EXT: no edema SKIN: fungal rash noted on the lower abd wall.   B hip flexor weakness noted.

## 2017-05-13 ENCOUNTER — Other Ambulatory Visit: Payer: Self-pay | Admitting: Family Medicine

## 2017-05-13 DIAGNOSIS — E785 Hyperlipidemia, unspecified: Secondary | ICD-10-CM | POA: Insufficient documentation

## 2017-05-13 DIAGNOSIS — R29898 Other symptoms and signs involving the musculoskeletal system: Secondary | ICD-10-CM

## 2017-05-13 NOTE — Assessment & Plan Note (Signed)
D/w pt and brother about this.  I'm going to check with neuro about MRI L spine and also getting PT set up.>25 minutes spent in face to face time with patient, >50% spent in counselling or coordination of care.  Likely that intermittent PT wouldn't help as much as daily PT.

## 2017-05-13 NOTE — Assessment & Plan Note (Signed)
Per outside clinic, I'll defer.

## 2017-05-13 NOTE — Assessment & Plan Note (Signed)
Per [facility] clinic, see above.  I'll defer.

## 2017-05-13 NOTE — Assessment & Plan Note (Signed)
Flu vaccine - today.   Mammogram - d/w pt.  See AVS.  PAP smear - done 11/14 and up to date as of today, d/w pt.  Reasonable for 5 year f/u if desired by patient but not due now.   Colonoscopy up to date.  Living will d/w pt.  Brother [name] [place] designated if patient were incapacitated.    Fall risk - d/w pt.  She can walk a few steps slowly, with supervision and help.  Can transfer but not ambulate far.  She has done PT intermittently prev but w/o sig benefit- she likely needs more intensive PT, not just twice a week.    Depression score: 9, followed by [facility] clinic.  I'll defer, d/w pt and brother.  She and brother agree.    Mini-Cog score: 18/20.  She has short attention span and is followed by neuropsych at [facility].  She is on amantadine for motivation and concentration per psych clinic.    Hearing-failed but not needing hearing aids per patient report.

## 2017-05-13 NOTE — Assessment & Plan Note (Signed)
Living will d/w pt.  Brother [name] [place] designated if patient were incapacitated.

## 2017-05-13 NOTE — Assessment & Plan Note (Signed)
No recent sz, I'll defer to neuro.

## 2017-05-13 NOTE — Assessment & Plan Note (Signed)
Her brother is going to help more on diet and that will help with weight reduction which should help with TG.  We'll check on getting PT set up to help with exercise.

## 2017-05-16 DIAGNOSIS — H524 Presbyopia: Secondary | ICD-10-CM | POA: Diagnosis not present

## 2017-05-16 DIAGNOSIS — H5203 Hypermetropia, bilateral: Secondary | ICD-10-CM | POA: Diagnosis not present

## 2017-05-16 DIAGNOSIS — H52223 Regular astigmatism, bilateral: Secondary | ICD-10-CM | POA: Diagnosis not present

## 2017-05-17 ENCOUNTER — Telehealth: Payer: Self-pay

## 2017-05-17 DIAGNOSIS — R29898 Other symptoms and signs involving the musculoskeletal system: Secondary | ICD-10-CM

## 2017-05-17 NOTE — Telephone Encounter (Signed)
Orders for MRI lumbar spine placed

## 2017-05-20 DIAGNOSIS — Z6838 Body mass index (BMI) 38.0-38.9, adult: Secondary | ICD-10-CM | POA: Diagnosis not present

## 2017-05-20 DIAGNOSIS — E669 Obesity, unspecified: Secondary | ICD-10-CM | POA: Diagnosis not present

## 2017-05-20 DIAGNOSIS — Z791 Long term (current) use of non-steroidal anti-inflammatories (NSAID): Secondary | ICD-10-CM | POA: Diagnosis not present

## 2017-05-20 DIAGNOSIS — Z85841 Personal history of malignant neoplasm of brain: Secondary | ICD-10-CM | POA: Diagnosis not present

## 2017-05-20 DIAGNOSIS — I69351 Hemiplegia and hemiparesis following cerebral infarction affecting right dominant side: Secondary | ICD-10-CM | POA: Diagnosis not present

## 2017-05-20 DIAGNOSIS — Z993 Dependence on wheelchair: Secondary | ICD-10-CM | POA: Diagnosis not present

## 2017-05-20 DIAGNOSIS — G40909 Epilepsy, unspecified, not intractable, without status epilepticus: Secondary | ICD-10-CM | POA: Diagnosis not present

## 2017-05-20 DIAGNOSIS — G4733 Obstructive sleep apnea (adult) (pediatric): Secondary | ICD-10-CM | POA: Diagnosis not present

## 2017-05-21 ENCOUNTER — Telehealth: Payer: Self-pay | Admitting: *Deleted

## 2017-05-21 NOTE — Telephone Encounter (Signed)
Please give the order.  Thanks.   

## 2017-05-21 NOTE — Telephone Encounter (Signed)
Audrey Turner with [facility] advised.

## 2017-05-21 NOTE — Telephone Encounter (Signed)
Copied from [place]. Topic: Inquiry >> May 21, 2017  8:32 AM Pricilla Handler wrote: Reason for CRM: Audrey Turner from [facility] (Cell # [phone], Office # [phone], FAX # [phone]) called requesting a verbal approval for PT (2 times a Week for Four Weeks) and requesting an OT referral for Right Upper Extremity Strengthening and Activities of Daily Living. Please call Jeani Hawking at [phone] or [phone].      Thank You!!!

## 2017-05-29 ENCOUNTER — Telehealth: Payer: Self-pay

## 2017-05-29 NOTE — Telephone Encounter (Signed)
Noted.  I await an update from OT.  Thanks.

## 2017-05-29 NOTE — Telephone Encounter (Signed)
Copied from [place] [phone]. Topic: Inquiry >> May 29, 2017  1:42 PM Corie Chiquito, [place] wrote: Reason for CRM: Izora Gala called to let Dr.Duncan know that the patient has agreed to the OT evaluation on 05-30-2017 but there is a delay per the patient. If he has any further questions she can be reached at [phone]

## 2017-05-30 ENCOUNTER — Telehealth: Payer: Self-pay | Admitting: Family Medicine

## 2017-05-30 DIAGNOSIS — I69351 Hemiplegia and hemiparesis following cerebral infarction affecting right dominant side: Secondary | ICD-10-CM | POA: Diagnosis not present

## 2017-05-30 DIAGNOSIS — Z993 Dependence on wheelchair: Secondary | ICD-10-CM | POA: Diagnosis not present

## 2017-05-30 DIAGNOSIS — Z791 Long term (current) use of non-steroidal anti-inflammatories (NSAID): Secondary | ICD-10-CM | POA: Diagnosis not present

## 2017-05-30 DIAGNOSIS — G4733 Obstructive sleep apnea (adult) (pediatric): Secondary | ICD-10-CM | POA: Diagnosis not present

## 2017-05-30 DIAGNOSIS — Z85841 Personal history of malignant neoplasm of brain: Secondary | ICD-10-CM | POA: Diagnosis not present

## 2017-05-30 DIAGNOSIS — Z6838 Body mass index (BMI) 38.0-38.9, adult: Secondary | ICD-10-CM | POA: Diagnosis not present

## 2017-05-30 DIAGNOSIS — E669 Obesity, unspecified: Secondary | ICD-10-CM | POA: Diagnosis not present

## 2017-05-30 DIAGNOSIS — G40909 Epilepsy, unspecified, not intractable, without status epilepticus: Secondary | ICD-10-CM | POA: Diagnosis not present

## 2017-05-30 NOTE — Telephone Encounter (Signed)
Copied from [place] [phone]. Topic: Inquiry >> May 30, 2017 10:16 AM Conception Chancy, NT wrote: Audrey Turner is calling from [facility], she is a occupational therapist. She is calling to inform Dr.Duncan that she was unable to do todays visit 05/30/17 due to patient forgot that nancy was coming. She states the OT evaluation will be delayed until next week. (no date at this time)  Audrey Turner [phone]

## 2017-05-31 DIAGNOSIS — I69351 Hemiplegia and hemiparesis following cerebral infarction affecting right dominant side: Secondary | ICD-10-CM | POA: Diagnosis not present

## 2017-05-31 DIAGNOSIS — Z791 Long term (current) use of non-steroidal anti-inflammatories (NSAID): Secondary | ICD-10-CM | POA: Diagnosis not present

## 2017-05-31 DIAGNOSIS — Z6838 Body mass index (BMI) 38.0-38.9, adult: Secondary | ICD-10-CM | POA: Diagnosis not present

## 2017-05-31 DIAGNOSIS — Z85841 Personal history of malignant neoplasm of brain: Secondary | ICD-10-CM | POA: Diagnosis not present

## 2017-05-31 DIAGNOSIS — G40909 Epilepsy, unspecified, not intractable, without status epilepticus: Secondary | ICD-10-CM | POA: Diagnosis not present

## 2017-05-31 DIAGNOSIS — G4733 Obstructive sleep apnea (adult) (pediatric): Secondary | ICD-10-CM | POA: Diagnosis not present

## 2017-05-31 DIAGNOSIS — E669 Obesity, unspecified: Secondary | ICD-10-CM | POA: Diagnosis not present

## 2017-05-31 DIAGNOSIS — Z993 Dependence on wheelchair: Secondary | ICD-10-CM | POA: Diagnosis not present

## 2017-05-31 NOTE — Telephone Encounter (Signed)
Okay. Noted.  

## 2017-06-05 DIAGNOSIS — J9610 Chronic respiratory failure, unspecified whether with hypoxia or hypercapnia: Secondary | ICD-10-CM | POA: Diagnosis not present

## 2017-06-07 DIAGNOSIS — I69351 Hemiplegia and hemiparesis following cerebral infarction affecting right dominant side: Secondary | ICD-10-CM | POA: Diagnosis not present

## 2017-06-07 DIAGNOSIS — Z85841 Personal history of malignant neoplasm of brain: Secondary | ICD-10-CM | POA: Diagnosis not present

## 2017-06-07 DIAGNOSIS — Z6838 Body mass index (BMI) 38.0-38.9, adult: Secondary | ICD-10-CM | POA: Diagnosis not present

## 2017-06-07 DIAGNOSIS — Z791 Long term (current) use of non-steroidal anti-inflammatories (NSAID): Secondary | ICD-10-CM | POA: Diagnosis not present

## 2017-06-07 DIAGNOSIS — G40909 Epilepsy, unspecified, not intractable, without status epilepticus: Secondary | ICD-10-CM | POA: Diagnosis not present

## 2017-06-07 DIAGNOSIS — G4733 Obstructive sleep apnea (adult) (pediatric): Secondary | ICD-10-CM | POA: Diagnosis not present

## 2017-06-07 DIAGNOSIS — E669 Obesity, unspecified: Secondary | ICD-10-CM | POA: Diagnosis not present

## 2017-06-07 DIAGNOSIS — Z993 Dependence on wheelchair: Secondary | ICD-10-CM | POA: Diagnosis not present

## 2017-06-10 ENCOUNTER — Telehealth: Payer: Self-pay | Admitting: Family Medicine

## 2017-06-10 NOTE — Telephone Encounter (Signed)
Copied from [place]. Topic: Quick Communication - See Telephone Encounter >> Jun 10, 2017  9:40 AM Robina Ade, Helene Kelp D wrote: CRM for notification. See Telephone encounter for: 06/10/17. Jobe Gibbon with [facility] called to have a verbal order for an approval for 2 missed PT. She can be reached at [phone].

## 2017-06-10 NOTE — Telephone Encounter (Signed)
Please give the order.  Thanks.   

## 2017-06-10 NOTE — Telephone Encounter (Signed)
Verbal order given to [facility] by telephone as instructed.

## 2017-06-11 DIAGNOSIS — I69351 Hemiplegia and hemiparesis following cerebral infarction affecting right dominant side: Secondary | ICD-10-CM | POA: Diagnosis not present

## 2017-06-11 DIAGNOSIS — G4733 Obstructive sleep apnea (adult) (pediatric): Secondary | ICD-10-CM | POA: Diagnosis not present

## 2017-06-11 DIAGNOSIS — G40909 Epilepsy, unspecified, not intractable, without status epilepticus: Secondary | ICD-10-CM | POA: Diagnosis not present

## 2017-06-11 DIAGNOSIS — E669 Obesity, unspecified: Secondary | ICD-10-CM | POA: Diagnosis not present

## 2017-06-18 DIAGNOSIS — C711 Malignant neoplasm of frontal lobe: Secondary | ICD-10-CM | POA: Diagnosis not present

## 2017-06-18 DIAGNOSIS — F0789 Other personality and behavioral disorders due to known physiological condition: Secondary | ICD-10-CM | POA: Diagnosis not present

## 2017-06-18 DIAGNOSIS — Z85841 Personal history of malignant neoplasm of brain: Secondary | ICD-10-CM | POA: Diagnosis not present

## 2017-06-18 DIAGNOSIS — F09 Unspecified mental disorder due to known physiological condition: Secondary | ICD-10-CM | POA: Diagnosis not present

## 2017-06-18 DIAGNOSIS — Z6838 Body mass index (BMI) 38.0-38.9, adult: Secondary | ICD-10-CM | POA: Diagnosis not present

## 2017-06-18 DIAGNOSIS — E669 Obesity, unspecified: Secondary | ICD-10-CM | POA: Diagnosis not present

## 2017-06-18 DIAGNOSIS — Z993 Dependence on wheelchair: Secondary | ICD-10-CM | POA: Diagnosis not present

## 2017-06-18 DIAGNOSIS — G4733 Obstructive sleep apnea (adult) (pediatric): Secondary | ICD-10-CM | POA: Diagnosis not present

## 2017-06-18 DIAGNOSIS — R69 Illness, unspecified: Secondary | ICD-10-CM | POA: Diagnosis not present

## 2017-06-18 DIAGNOSIS — G40909 Epilepsy, unspecified, not intractable, without status epilepticus: Secondary | ICD-10-CM | POA: Diagnosis not present

## 2017-06-18 DIAGNOSIS — Z791 Long term (current) use of non-steroidal anti-inflammatories (NSAID): Secondary | ICD-10-CM | POA: Diagnosis not present

## 2017-06-18 DIAGNOSIS — F339 Major depressive disorder, recurrent, unspecified: Secondary | ICD-10-CM | POA: Diagnosis not present

## 2017-06-18 DIAGNOSIS — I69351 Hemiplegia and hemiparesis following cerebral infarction affecting right dominant side: Secondary | ICD-10-CM | POA: Diagnosis not present

## 2017-06-20 DIAGNOSIS — G4733 Obstructive sleep apnea (adult) (pediatric): Secondary | ICD-10-CM | POA: Diagnosis not present

## 2017-06-20 DIAGNOSIS — I69351 Hemiplegia and hemiparesis following cerebral infarction affecting right dominant side: Secondary | ICD-10-CM | POA: Diagnosis not present

## 2017-06-20 DIAGNOSIS — Z993 Dependence on wheelchair: Secondary | ICD-10-CM | POA: Diagnosis not present

## 2017-06-20 DIAGNOSIS — Z6838 Body mass index (BMI) 38.0-38.9, adult: Secondary | ICD-10-CM | POA: Diagnosis not present

## 2017-06-20 DIAGNOSIS — Z85841 Personal history of malignant neoplasm of brain: Secondary | ICD-10-CM | POA: Diagnosis not present

## 2017-06-20 DIAGNOSIS — G40909 Epilepsy, unspecified, not intractable, without status epilepticus: Secondary | ICD-10-CM | POA: Diagnosis not present

## 2017-06-20 DIAGNOSIS — Z791 Long term (current) use of non-steroidal anti-inflammatories (NSAID): Secondary | ICD-10-CM | POA: Diagnosis not present

## 2017-06-20 DIAGNOSIS — E669 Obesity, unspecified: Secondary | ICD-10-CM | POA: Diagnosis not present

## 2017-06-27 ENCOUNTER — Telehealth: Payer: Self-pay | Admitting: Family Medicine

## 2017-06-27 DIAGNOSIS — G40909 Epilepsy, unspecified, not intractable, without status epilepticus: Secondary | ICD-10-CM | POA: Diagnosis not present

## 2017-06-27 DIAGNOSIS — I69351 Hemiplegia and hemiparesis following cerebral infarction affecting right dominant side: Secondary | ICD-10-CM | POA: Diagnosis not present

## 2017-06-27 DIAGNOSIS — Z6838 Body mass index (BMI) 38.0-38.9, adult: Secondary | ICD-10-CM | POA: Diagnosis not present

## 2017-06-27 DIAGNOSIS — E669 Obesity, unspecified: Secondary | ICD-10-CM | POA: Diagnosis not present

## 2017-06-27 DIAGNOSIS — Z791 Long term (current) use of non-steroidal anti-inflammatories (NSAID): Secondary | ICD-10-CM | POA: Diagnosis not present

## 2017-06-27 DIAGNOSIS — Z85841 Personal history of malignant neoplasm of brain: Secondary | ICD-10-CM | POA: Diagnosis not present

## 2017-06-27 DIAGNOSIS — Z993 Dependence on wheelchair: Secondary | ICD-10-CM | POA: Diagnosis not present

## 2017-06-27 DIAGNOSIS — G4733 Obstructive sleep apnea (adult) (pediatric): Secondary | ICD-10-CM | POA: Diagnosis not present

## 2017-06-27 NOTE — Telephone Encounter (Signed)
Copied from [place]. Topic: Quick Communication - See Telephone Encounter >> Jun 27, 2017  3:59 PM Neva Seat wrote: Jobe Gibbon PT w/ [facility] [phone]  Verbal Orders: 2 times a week for 3 weeks To Add OT

## 2017-06-28 NOTE — Telephone Encounter (Signed)
Please give the order.  Thanks.   

## 2017-06-28 NOTE — Telephone Encounter (Signed)
Left detailed message on voicemail of Erline Levine, PT with [facility].

## 2017-07-03 DIAGNOSIS — G4733 Obstructive sleep apnea (adult) (pediatric): Secondary | ICD-10-CM | POA: Diagnosis not present

## 2017-07-03 DIAGNOSIS — Z791 Long term (current) use of non-steroidal anti-inflammatories (NSAID): Secondary | ICD-10-CM | POA: Diagnosis not present

## 2017-07-03 DIAGNOSIS — Z993 Dependence on wheelchair: Secondary | ICD-10-CM | POA: Diagnosis not present

## 2017-07-03 DIAGNOSIS — E669 Obesity, unspecified: Secondary | ICD-10-CM | POA: Diagnosis not present

## 2017-07-03 DIAGNOSIS — G40909 Epilepsy, unspecified, not intractable, without status epilepticus: Secondary | ICD-10-CM | POA: Diagnosis not present

## 2017-07-03 DIAGNOSIS — Z6838 Body mass index (BMI) 38.0-38.9, adult: Secondary | ICD-10-CM | POA: Diagnosis not present

## 2017-07-03 DIAGNOSIS — Z85841 Personal history of malignant neoplasm of brain: Secondary | ICD-10-CM | POA: Diagnosis not present

## 2017-07-03 DIAGNOSIS — J9610 Chronic respiratory failure, unspecified whether with hypoxia or hypercapnia: Secondary | ICD-10-CM | POA: Diagnosis not present

## 2017-07-03 DIAGNOSIS — I69351 Hemiplegia and hemiparesis following cerebral infarction affecting right dominant side: Secondary | ICD-10-CM | POA: Diagnosis not present

## 2017-07-05 DIAGNOSIS — Z993 Dependence on wheelchair: Secondary | ICD-10-CM | POA: Diagnosis not present

## 2017-07-05 DIAGNOSIS — Z6838 Body mass index (BMI) 38.0-38.9, adult: Secondary | ICD-10-CM | POA: Diagnosis not present

## 2017-07-05 DIAGNOSIS — Z791 Long term (current) use of non-steroidal anti-inflammatories (NSAID): Secondary | ICD-10-CM | POA: Diagnosis not present

## 2017-07-05 DIAGNOSIS — Z85841 Personal history of malignant neoplasm of brain: Secondary | ICD-10-CM | POA: Diagnosis not present

## 2017-07-05 DIAGNOSIS — I69351 Hemiplegia and hemiparesis following cerebral infarction affecting right dominant side: Secondary | ICD-10-CM | POA: Diagnosis not present

## 2017-07-05 DIAGNOSIS — G4733 Obstructive sleep apnea (adult) (pediatric): Secondary | ICD-10-CM | POA: Diagnosis not present

## 2017-07-05 DIAGNOSIS — E669 Obesity, unspecified: Secondary | ICD-10-CM | POA: Diagnosis not present

## 2017-07-05 DIAGNOSIS — G40909 Epilepsy, unspecified, not intractable, without status epilepticus: Secondary | ICD-10-CM | POA: Diagnosis not present

## 2017-07-08 ENCOUNTER — Telehealth: Payer: Self-pay | Admitting: Family Medicine

## 2017-07-08 NOTE — Telephone Encounter (Signed)
Please give the order.  Thanks.   

## 2017-07-08 NOTE — Telephone Encounter (Signed)
Copied from [place] [phone]. Topic: Quick Communication - See Telephone Encounter >> Jul 08, 2017  1:44 PM Conception Chancy, NT wrote: CRM for notification. See Telephone encounter for: 07/08/17.  Audrey Turner is calling from [facility] she is a occupational therapist. She states the OT evaluation order was for last week and she states that her husband said come back this week. She states the order expired 07/05/17 and would like the order changed to this week. Please contact.  [phone]

## 2017-07-08 NOTE — Telephone Encounter (Signed)
Verbal order given to [facility] as instructed.

## 2017-07-10 DIAGNOSIS — Z993 Dependence on wheelchair: Secondary | ICD-10-CM | POA: Diagnosis not present

## 2017-07-10 DIAGNOSIS — G4733 Obstructive sleep apnea (adult) (pediatric): Secondary | ICD-10-CM | POA: Diagnosis not present

## 2017-07-10 DIAGNOSIS — G40909 Epilepsy, unspecified, not intractable, without status epilepticus: Secondary | ICD-10-CM | POA: Diagnosis not present

## 2017-07-10 DIAGNOSIS — Z6838 Body mass index (BMI) 38.0-38.9, adult: Secondary | ICD-10-CM | POA: Diagnosis not present

## 2017-07-10 DIAGNOSIS — Z791 Long term (current) use of non-steroidal anti-inflammatories (NSAID): Secondary | ICD-10-CM | POA: Diagnosis not present

## 2017-07-10 DIAGNOSIS — Z85841 Personal history of malignant neoplasm of brain: Secondary | ICD-10-CM | POA: Diagnosis not present

## 2017-07-10 DIAGNOSIS — E669 Obesity, unspecified: Secondary | ICD-10-CM | POA: Diagnosis not present

## 2017-07-10 DIAGNOSIS — I69351 Hemiplegia and hemiparesis following cerebral infarction affecting right dominant side: Secondary | ICD-10-CM | POA: Diagnosis not present

## 2017-07-11 DIAGNOSIS — E669 Obesity, unspecified: Secondary | ICD-10-CM | POA: Diagnosis not present

## 2017-07-11 DIAGNOSIS — Z85841 Personal history of malignant neoplasm of brain: Secondary | ICD-10-CM | POA: Diagnosis not present

## 2017-07-11 DIAGNOSIS — G4733 Obstructive sleep apnea (adult) (pediatric): Secondary | ICD-10-CM | POA: Diagnosis not present

## 2017-07-11 DIAGNOSIS — G40909 Epilepsy, unspecified, not intractable, without status epilepticus: Secondary | ICD-10-CM | POA: Diagnosis not present

## 2017-07-11 DIAGNOSIS — Z993 Dependence on wheelchair: Secondary | ICD-10-CM | POA: Diagnosis not present

## 2017-07-11 DIAGNOSIS — Z791 Long term (current) use of non-steroidal anti-inflammatories (NSAID): Secondary | ICD-10-CM | POA: Diagnosis not present

## 2017-07-11 DIAGNOSIS — I69351 Hemiplegia and hemiparesis following cerebral infarction affecting right dominant side: Secondary | ICD-10-CM | POA: Diagnosis not present

## 2017-07-11 DIAGNOSIS — Z6838 Body mass index (BMI) 38.0-38.9, adult: Secondary | ICD-10-CM | POA: Diagnosis not present

## 2017-07-12 DIAGNOSIS — Z85841 Personal history of malignant neoplasm of brain: Secondary | ICD-10-CM | POA: Diagnosis not present

## 2017-07-12 DIAGNOSIS — I69351 Hemiplegia and hemiparesis following cerebral infarction affecting right dominant side: Secondary | ICD-10-CM | POA: Diagnosis not present

## 2017-07-12 DIAGNOSIS — Z791 Long term (current) use of non-steroidal anti-inflammatories (NSAID): Secondary | ICD-10-CM | POA: Diagnosis not present

## 2017-07-12 DIAGNOSIS — Z993 Dependence on wheelchair: Secondary | ICD-10-CM | POA: Diagnosis not present

## 2017-07-12 DIAGNOSIS — E669 Obesity, unspecified: Secondary | ICD-10-CM | POA: Diagnosis not present

## 2017-07-12 DIAGNOSIS — G40909 Epilepsy, unspecified, not intractable, without status epilepticus: Secondary | ICD-10-CM | POA: Diagnosis not present

## 2017-07-12 DIAGNOSIS — Z6838 Body mass index (BMI) 38.0-38.9, adult: Secondary | ICD-10-CM | POA: Diagnosis not present

## 2017-07-12 DIAGNOSIS — G4733 Obstructive sleep apnea (adult) (pediatric): Secondary | ICD-10-CM | POA: Diagnosis not present

## 2017-07-15 ENCOUNTER — Telehealth: Payer: Self-pay | Admitting: Family Medicine

## 2017-07-15 NOTE — Telephone Encounter (Signed)
Copied from [place]. Topic: Inquiry >> Jul 15, 2017 11:32 AM Lennox Solders wrote: Reason for CRM: esther  from [facility] is calling requesting verbal order for 1 wk 1 for OT. Pt will needs re-cert on this Thursday 07-18-17. Sherlynn Stalls also would like another visit this Friday 07-19-17 for 1 wk 1 then esther needs 2 wk for 4wks

## 2017-07-15 NOTE — Telephone Encounter (Signed)
Order given to [facility] with [facility].

## 2017-07-15 NOTE — Telephone Encounter (Signed)
Please give the order.  Thanks.   

## 2017-07-16 ENCOUNTER — Telehealth: Payer: Self-pay | Admitting: Family Medicine

## 2017-07-16 DIAGNOSIS — E669 Obesity, unspecified: Secondary | ICD-10-CM | POA: Diagnosis not present

## 2017-07-16 DIAGNOSIS — Z791 Long term (current) use of non-steroidal anti-inflammatories (NSAID): Secondary | ICD-10-CM | POA: Diagnosis not present

## 2017-07-16 DIAGNOSIS — Z6838 Body mass index (BMI) 38.0-38.9, adult: Secondary | ICD-10-CM | POA: Diagnosis not present

## 2017-07-16 DIAGNOSIS — I69351 Hemiplegia and hemiparesis following cerebral infarction affecting right dominant side: Secondary | ICD-10-CM | POA: Diagnosis not present

## 2017-07-16 DIAGNOSIS — G4733 Obstructive sleep apnea (adult) (pediatric): Secondary | ICD-10-CM | POA: Diagnosis not present

## 2017-07-16 DIAGNOSIS — Z993 Dependence on wheelchair: Secondary | ICD-10-CM | POA: Diagnosis not present

## 2017-07-16 DIAGNOSIS — Z85841 Personal history of malignant neoplasm of brain: Secondary | ICD-10-CM | POA: Diagnosis not present

## 2017-07-16 DIAGNOSIS — G40909 Epilepsy, unspecified, not intractable, without status epilepticus: Secondary | ICD-10-CM | POA: Diagnosis not present

## 2017-07-16 NOTE — Telephone Encounter (Signed)
Copied from [place]. Topic: Inquiry >> Jul 15, 2017 11:32 AM Lennox Solders wrote: Reason for CRM: Audrey Turner  [facility] is calling requesting verbal order for 1 wk 1 for OT. Pt will needs re-cert on this Thursday 07-18-17. Audrey Turner also would like another visit this Friday 07-19-16 for 1 wk 1 then Audrey Turner needs 2 wk for 4wks >> Jul 16, 2017  8:54 AM Oneta Rack wrote: Audrey Turner name: Audrey Turner  Relation to pt : PT from [facility] Call back number: [phone]    Reason for call:  Requesting verbal orders recert PT and OT 2x 4

## 2017-07-17 NOTE — Telephone Encounter (Signed)
Verbal order given by telephone to [facility] as instructed.

## 2017-07-17 NOTE — Telephone Encounter (Signed)
Please give the order.  Thanks.   

## 2017-07-19 DIAGNOSIS — Z6838 Body mass index (BMI) 38.0-38.9, adult: Secondary | ICD-10-CM | POA: Diagnosis not present

## 2017-07-19 DIAGNOSIS — Z993 Dependence on wheelchair: Secondary | ICD-10-CM | POA: Diagnosis not present

## 2017-07-19 DIAGNOSIS — G4733 Obstructive sleep apnea (adult) (pediatric): Secondary | ICD-10-CM | POA: Diagnosis not present

## 2017-07-19 DIAGNOSIS — G40909 Epilepsy, unspecified, not intractable, without status epilepticus: Secondary | ICD-10-CM | POA: Diagnosis not present

## 2017-07-19 DIAGNOSIS — I69351 Hemiplegia and hemiparesis following cerebral infarction affecting right dominant side: Secondary | ICD-10-CM | POA: Diagnosis not present

## 2017-07-19 DIAGNOSIS — Z85841 Personal history of malignant neoplasm of brain: Secondary | ICD-10-CM | POA: Diagnosis not present

## 2017-07-19 DIAGNOSIS — Z791 Long term (current) use of non-steroidal anti-inflammatories (NSAID): Secondary | ICD-10-CM | POA: Diagnosis not present

## 2017-07-19 DIAGNOSIS — E669 Obesity, unspecified: Secondary | ICD-10-CM | POA: Diagnosis not present

## 2017-07-31 DIAGNOSIS — Z85841 Personal history of malignant neoplasm of brain: Secondary | ICD-10-CM | POA: Diagnosis not present

## 2017-07-31 DIAGNOSIS — G40909 Epilepsy, unspecified, not intractable, without status epilepticus: Secondary | ICD-10-CM | POA: Diagnosis not present

## 2017-07-31 DIAGNOSIS — E669 Obesity, unspecified: Secondary | ICD-10-CM | POA: Diagnosis not present

## 2017-07-31 DIAGNOSIS — Z6838 Body mass index (BMI) 38.0-38.9, adult: Secondary | ICD-10-CM | POA: Diagnosis not present

## 2017-07-31 DIAGNOSIS — G4733 Obstructive sleep apnea (adult) (pediatric): Secondary | ICD-10-CM | POA: Diagnosis not present

## 2017-07-31 DIAGNOSIS — Z791 Long term (current) use of non-steroidal anti-inflammatories (NSAID): Secondary | ICD-10-CM | POA: Diagnosis not present

## 2017-07-31 DIAGNOSIS — Z993 Dependence on wheelchair: Secondary | ICD-10-CM | POA: Diagnosis not present

## 2017-07-31 DIAGNOSIS — I69351 Hemiplegia and hemiparesis following cerebral infarction affecting right dominant side: Secondary | ICD-10-CM | POA: Diagnosis not present

## 2017-08-01 ENCOUNTER — Telehealth: Payer: Self-pay | Admitting: Family Medicine

## 2017-08-01 NOTE — Telephone Encounter (Signed)
Samantha advised 

## 2017-08-01 NOTE — Telephone Encounter (Signed)
Copied from [place]. Topic: Quick Communication - See Telephone Encounter >> Aug 01, 2017 12:11 PM Rutherford Nail, NT wrote: CRM for notification. See Telephone encounter for: 08/01/17. Aldona Bar, Physical therapy assistant, calling from [facility] stating the patient cancelled her appointment for yesterday (07/31/17) and is needing verbal orders to get this appointment rescheduled. CB#: [phone]

## 2017-08-01 NOTE — Telephone Encounter (Signed)
Please give the order.  Thanks.   

## 2017-08-02 ENCOUNTER — Telehealth: Payer: Self-pay | Admitting: Family Medicine

## 2017-08-02 DIAGNOSIS — Z6838 Body mass index (BMI) 38.0-38.9, adult: Secondary | ICD-10-CM | POA: Diagnosis not present

## 2017-08-02 DIAGNOSIS — I69351 Hemiplegia and hemiparesis following cerebral infarction affecting right dominant side: Secondary | ICD-10-CM | POA: Diagnosis not present

## 2017-08-02 DIAGNOSIS — G4733 Obstructive sleep apnea (adult) (pediatric): Secondary | ICD-10-CM | POA: Diagnosis not present

## 2017-08-02 DIAGNOSIS — Z993 Dependence on wheelchair: Secondary | ICD-10-CM | POA: Diagnosis not present

## 2017-08-02 DIAGNOSIS — Z85841 Personal history of malignant neoplasm of brain: Secondary | ICD-10-CM | POA: Diagnosis not present

## 2017-08-02 DIAGNOSIS — Z791 Long term (current) use of non-steroidal anti-inflammatories (NSAID): Secondary | ICD-10-CM | POA: Diagnosis not present

## 2017-08-02 DIAGNOSIS — G40909 Epilepsy, unspecified, not intractable, without status epilepticus: Secondary | ICD-10-CM | POA: Diagnosis not present

## 2017-08-02 DIAGNOSIS — E669 Obesity, unspecified: Secondary | ICD-10-CM | POA: Diagnosis not present

## 2017-08-02 NOTE — Telephone Encounter (Signed)
Brother advised.

## 2017-08-02 NOTE — Telephone Encounter (Signed)
If no injury, then would continue with PT.  If more falls or if any injury then let me know.  Thanks.

## 2017-08-02 NOTE — Telephone Encounter (Signed)
Copied from [place]. Topic: General - Other >> Aug 02, 2017  1:20 PM [name] wrote: Reason for CRM: calling to report a fall Saturday 07/27/17. Pt brother states she sort of slid out of her chair. No signs of injury or pain.

## 2017-08-03 DIAGNOSIS — J9610 Chronic respiratory failure, unspecified whether with hypoxia or hypercapnia: Secondary | ICD-10-CM | POA: Diagnosis not present

## 2017-08-05 DIAGNOSIS — Z791 Long term (current) use of non-steroidal anti-inflammatories (NSAID): Secondary | ICD-10-CM | POA: Diagnosis not present

## 2017-08-05 DIAGNOSIS — Z6838 Body mass index (BMI) 38.0-38.9, adult: Secondary | ICD-10-CM | POA: Diagnosis not present

## 2017-08-05 DIAGNOSIS — G4733 Obstructive sleep apnea (adult) (pediatric): Secondary | ICD-10-CM | POA: Diagnosis not present

## 2017-08-05 DIAGNOSIS — Z85841 Personal history of malignant neoplasm of brain: Secondary | ICD-10-CM | POA: Diagnosis not present

## 2017-08-05 DIAGNOSIS — Z993 Dependence on wheelchair: Secondary | ICD-10-CM | POA: Diagnosis not present

## 2017-08-05 DIAGNOSIS — I69351 Hemiplegia and hemiparesis following cerebral infarction affecting right dominant side: Secondary | ICD-10-CM | POA: Diagnosis not present

## 2017-08-05 DIAGNOSIS — E669 Obesity, unspecified: Secondary | ICD-10-CM | POA: Diagnosis not present

## 2017-08-05 DIAGNOSIS — G40909 Epilepsy, unspecified, not intractable, without status epilepticus: Secondary | ICD-10-CM | POA: Diagnosis not present

## 2017-08-06 ENCOUNTER — Other Ambulatory Visit: Payer: Self-pay

## 2017-08-06 ENCOUNTER — Telehealth: Payer: Self-pay | Admitting: Neurology

## 2017-08-06 DIAGNOSIS — G40209 Localization-related (focal) (partial) symptomatic epilepsy and epileptic syndromes with complex partial seizures, not intractable, without status epilepticus: Secondary | ICD-10-CM

## 2017-08-06 MED ORDER — LEVETIRACETAM 750 MG PO TABS
750.0000 mg | ORAL_TABLET | Freq: Two times a day (BID) | ORAL | 2 refills | Status: DC
Start: 2017-08-06 — End: 2018-02-28

## 2017-08-06 NOTE — Telephone Encounter (Signed)
Patient's brother called regarding Jazzie's medication Keppra. She runs out on Sunday. She said the Pharmacy told them it was denied. The last refill was for 6 months. [facility] since her last prescription. Please Call. Thanks

## 2017-08-06 NOTE — Telephone Encounter (Signed)
Spoke with pt's brother, [name].  Let him know that refill was denied due to pt not being seen in over a year.  [name] made f/u appointment for pt for November.  [facility] [place] [place]

## 2017-08-07 DIAGNOSIS — Z791 Long term (current) use of non-steroidal anti-inflammatories (NSAID): Secondary | ICD-10-CM | POA: Diagnosis not present

## 2017-08-07 DIAGNOSIS — I69351 Hemiplegia and hemiparesis following cerebral infarction affecting right dominant side: Secondary | ICD-10-CM | POA: Diagnosis not present

## 2017-08-07 DIAGNOSIS — G4733 Obstructive sleep apnea (adult) (pediatric): Secondary | ICD-10-CM | POA: Diagnosis not present

## 2017-08-07 DIAGNOSIS — Z6838 Body mass index (BMI) 38.0-38.9, adult: Secondary | ICD-10-CM | POA: Diagnosis not present

## 2017-08-07 DIAGNOSIS — Z993 Dependence on wheelchair: Secondary | ICD-10-CM | POA: Diagnosis not present

## 2017-08-07 DIAGNOSIS — E669 Obesity, unspecified: Secondary | ICD-10-CM | POA: Diagnosis not present

## 2017-08-07 DIAGNOSIS — G40909 Epilepsy, unspecified, not intractable, without status epilepticus: Secondary | ICD-10-CM | POA: Diagnosis not present

## 2017-08-07 DIAGNOSIS — Z85841 Personal history of malignant neoplasm of brain: Secondary | ICD-10-CM | POA: Diagnosis not present

## 2017-08-08 DIAGNOSIS — Z993 Dependence on wheelchair: Secondary | ICD-10-CM | POA: Diagnosis not present

## 2017-08-08 DIAGNOSIS — G4733 Obstructive sleep apnea (adult) (pediatric): Secondary | ICD-10-CM | POA: Diagnosis not present

## 2017-08-08 DIAGNOSIS — G40909 Epilepsy, unspecified, not intractable, without status epilepticus: Secondary | ICD-10-CM | POA: Diagnosis not present

## 2017-08-08 DIAGNOSIS — Z85841 Personal history of malignant neoplasm of brain: Secondary | ICD-10-CM | POA: Diagnosis not present

## 2017-08-08 DIAGNOSIS — Z6838 Body mass index (BMI) 38.0-38.9, adult: Secondary | ICD-10-CM | POA: Diagnosis not present

## 2017-08-08 DIAGNOSIS — I69351 Hemiplegia and hemiparesis following cerebral infarction affecting right dominant side: Secondary | ICD-10-CM | POA: Diagnosis not present

## 2017-08-08 DIAGNOSIS — Z791 Long term (current) use of non-steroidal anti-inflammatories (NSAID): Secondary | ICD-10-CM | POA: Diagnosis not present

## 2017-08-08 DIAGNOSIS — E669 Obesity, unspecified: Secondary | ICD-10-CM | POA: Diagnosis not present

## 2017-08-09 ENCOUNTER — Telehealth: Payer: Self-pay | Admitting: Family Medicine

## 2017-08-09 NOTE — Telephone Encounter (Signed)
Copied from [place] [id number]. Topic: Quick Communication - See Telephone Encounter >> Aug 09, 2017  3:24 PM Synthia Innocent wrote: CRM for notification. See Telephone encounter for: 08/09/17. Per [facility] patient had a fall on 08/08/17, no injury. Soft fall out of wheel chair, did complain of left leg pain. Doing fine, just sore.

## 2017-08-11 NOTE — Telephone Encounter (Signed)
Give routine fall cautions, continue as is o/w, thanks.

## 2017-08-12 DIAGNOSIS — Z993 Dependence on wheelchair: Secondary | ICD-10-CM | POA: Diagnosis not present

## 2017-08-12 DIAGNOSIS — G40909 Epilepsy, unspecified, not intractable, without status epilepticus: Secondary | ICD-10-CM | POA: Diagnosis not present

## 2017-08-12 DIAGNOSIS — Z85841 Personal history of malignant neoplasm of brain: Secondary | ICD-10-CM | POA: Diagnosis not present

## 2017-08-12 DIAGNOSIS — G4733 Obstructive sleep apnea (adult) (pediatric): Secondary | ICD-10-CM | POA: Diagnosis not present

## 2017-08-12 DIAGNOSIS — Z791 Long term (current) use of non-steroidal anti-inflammatories (NSAID): Secondary | ICD-10-CM | POA: Diagnosis not present

## 2017-08-12 DIAGNOSIS — Z6838 Body mass index (BMI) 38.0-38.9, adult: Secondary | ICD-10-CM | POA: Diagnosis not present

## 2017-08-12 DIAGNOSIS — E669 Obesity, unspecified: Secondary | ICD-10-CM | POA: Diagnosis not present

## 2017-08-12 DIAGNOSIS — I69351 Hemiplegia and hemiparesis following cerebral infarction affecting right dominant side: Secondary | ICD-10-CM | POA: Diagnosis not present

## 2017-08-12 NOTE — Telephone Encounter (Signed)
Spoke with Aldona Bar and advised.

## 2017-08-13 DIAGNOSIS — Z85841 Personal history of malignant neoplasm of brain: Secondary | ICD-10-CM | POA: Diagnosis not present

## 2017-08-13 DIAGNOSIS — Z6838 Body mass index (BMI) 38.0-38.9, adult: Secondary | ICD-10-CM | POA: Diagnosis not present

## 2017-08-13 DIAGNOSIS — G40909 Epilepsy, unspecified, not intractable, without status epilepticus: Secondary | ICD-10-CM | POA: Diagnosis not present

## 2017-08-13 DIAGNOSIS — E669 Obesity, unspecified: Secondary | ICD-10-CM | POA: Diagnosis not present

## 2017-08-13 DIAGNOSIS — Z791 Long term (current) use of non-steroidal anti-inflammatories (NSAID): Secondary | ICD-10-CM | POA: Diagnosis not present

## 2017-08-13 DIAGNOSIS — Z993 Dependence on wheelchair: Secondary | ICD-10-CM | POA: Diagnosis not present

## 2017-08-13 DIAGNOSIS — G4733 Obstructive sleep apnea (adult) (pediatric): Secondary | ICD-10-CM | POA: Diagnosis not present

## 2017-08-13 DIAGNOSIS — I69351 Hemiplegia and hemiparesis following cerebral infarction affecting right dominant side: Secondary | ICD-10-CM | POA: Diagnosis not present

## 2017-08-14 DIAGNOSIS — G4733 Obstructive sleep apnea (adult) (pediatric): Secondary | ICD-10-CM | POA: Diagnosis not present

## 2017-08-14 DIAGNOSIS — I69351 Hemiplegia and hemiparesis following cerebral infarction affecting right dominant side: Secondary | ICD-10-CM | POA: Diagnosis not present

## 2017-08-14 DIAGNOSIS — Z6838 Body mass index (BMI) 38.0-38.9, adult: Secondary | ICD-10-CM | POA: Diagnosis not present

## 2017-08-14 DIAGNOSIS — E669 Obesity, unspecified: Secondary | ICD-10-CM | POA: Diagnosis not present

## 2017-08-14 DIAGNOSIS — G40909 Epilepsy, unspecified, not intractable, without status epilepticus: Secondary | ICD-10-CM | POA: Diagnosis not present

## 2017-08-14 DIAGNOSIS — Z85841 Personal history of malignant neoplasm of brain: Secondary | ICD-10-CM | POA: Diagnosis not present

## 2017-08-14 DIAGNOSIS — Z993 Dependence on wheelchair: Secondary | ICD-10-CM | POA: Diagnosis not present

## 2017-08-14 DIAGNOSIS — Z791 Long term (current) use of non-steroidal anti-inflammatories (NSAID): Secondary | ICD-10-CM | POA: Diagnosis not present

## 2017-08-15 ENCOUNTER — Telehealth: Payer: Self-pay | Admitting: Family Medicine

## 2017-08-15 NOTE — Telephone Encounter (Signed)
Please give the order.  Thanks.   

## 2017-08-15 NOTE — Telephone Encounter (Signed)
Left detailed message on voicemail of Audrey Turner. 

## 2017-08-15 NOTE — Telephone Encounter (Signed)
Copied from [place] [phone]. Topic: General - Other >> Aug 15, 2017  2:53 PM [name] wrote: Requesting VO for OT for 2 week 4. Pt continues to make good progress.

## 2017-08-16 ENCOUNTER — Telehealth: Payer: Self-pay | Admitting: Family Medicine

## 2017-08-16 DIAGNOSIS — Z6838 Body mass index (BMI) 38.0-38.9, adult: Secondary | ICD-10-CM | POA: Diagnosis not present

## 2017-08-16 DIAGNOSIS — Z791 Long term (current) use of non-steroidal anti-inflammatories (NSAID): Secondary | ICD-10-CM | POA: Diagnosis not present

## 2017-08-16 DIAGNOSIS — E669 Obesity, unspecified: Secondary | ICD-10-CM | POA: Diagnosis not present

## 2017-08-16 DIAGNOSIS — Z993 Dependence on wheelchair: Secondary | ICD-10-CM | POA: Diagnosis not present

## 2017-08-16 DIAGNOSIS — Z85841 Personal history of malignant neoplasm of brain: Secondary | ICD-10-CM | POA: Diagnosis not present

## 2017-08-16 DIAGNOSIS — G40909 Epilepsy, unspecified, not intractable, without status epilepticus: Secondary | ICD-10-CM | POA: Diagnosis not present

## 2017-08-16 DIAGNOSIS — G4733 Obstructive sleep apnea (adult) (pediatric): Secondary | ICD-10-CM | POA: Diagnosis not present

## 2017-08-16 DIAGNOSIS — I69351 Hemiplegia and hemiparesis following cerebral infarction affecting right dominant side: Secondary | ICD-10-CM | POA: Diagnosis not present

## 2017-08-16 NOTE — Telephone Encounter (Signed)
Copied from [place] [phone]. Topic: Quick Communication - See Telephone Encounter >> Aug 16, 2017  3:21 PM Synthia Innocent wrote: CRM for notification. See Telephone encounter for: 08/16/17. Requesting verbals orders for PT, 2x a weeks till June 10th

## 2017-08-18 NOTE — Telephone Encounter (Signed)
Please give the order.  Thanks.   

## 2017-08-19 DIAGNOSIS — Z6838 Body mass index (BMI) 38.0-38.9, adult: Secondary | ICD-10-CM | POA: Diagnosis not present

## 2017-08-19 DIAGNOSIS — G4733 Obstructive sleep apnea (adult) (pediatric): Secondary | ICD-10-CM | POA: Diagnosis not present

## 2017-08-19 DIAGNOSIS — Z85841 Personal history of malignant neoplasm of brain: Secondary | ICD-10-CM | POA: Diagnosis not present

## 2017-08-19 DIAGNOSIS — I69351 Hemiplegia and hemiparesis following cerebral infarction affecting right dominant side: Secondary | ICD-10-CM | POA: Diagnosis not present

## 2017-08-19 DIAGNOSIS — E669 Obesity, unspecified: Secondary | ICD-10-CM | POA: Diagnosis not present

## 2017-08-19 DIAGNOSIS — G40909 Epilepsy, unspecified, not intractable, without status epilepticus: Secondary | ICD-10-CM | POA: Diagnosis not present

## 2017-08-19 DIAGNOSIS — Z791 Long term (current) use of non-steroidal anti-inflammatories (NSAID): Secondary | ICD-10-CM | POA: Diagnosis not present

## 2017-08-19 DIAGNOSIS — Z993 Dependence on wheelchair: Secondary | ICD-10-CM | POA: Diagnosis not present

## 2017-08-19 NOTE — Telephone Encounter (Signed)
Verbal order given to [facility] by telephone as instructed.

## 2017-08-21 DIAGNOSIS — G4733 Obstructive sleep apnea (adult) (pediatric): Secondary | ICD-10-CM | POA: Diagnosis not present

## 2017-08-21 DIAGNOSIS — Z85841 Personal history of malignant neoplasm of brain: Secondary | ICD-10-CM | POA: Diagnosis not present

## 2017-08-21 DIAGNOSIS — Z791 Long term (current) use of non-steroidal anti-inflammatories (NSAID): Secondary | ICD-10-CM | POA: Diagnosis not present

## 2017-08-21 DIAGNOSIS — I69351 Hemiplegia and hemiparesis following cerebral infarction affecting right dominant side: Secondary | ICD-10-CM | POA: Diagnosis not present

## 2017-08-21 DIAGNOSIS — Z993 Dependence on wheelchair: Secondary | ICD-10-CM | POA: Diagnosis not present

## 2017-08-21 DIAGNOSIS — G40909 Epilepsy, unspecified, not intractable, without status epilepticus: Secondary | ICD-10-CM | POA: Diagnosis not present

## 2017-08-21 DIAGNOSIS — Z6838 Body mass index (BMI) 38.0-38.9, adult: Secondary | ICD-10-CM | POA: Diagnosis not present

## 2017-08-21 DIAGNOSIS — E669 Obesity, unspecified: Secondary | ICD-10-CM | POA: Diagnosis not present

## 2017-08-27 DIAGNOSIS — G40909 Epilepsy, unspecified, not intractable, without status epilepticus: Secondary | ICD-10-CM | POA: Diagnosis not present

## 2017-08-27 DIAGNOSIS — Z85841 Personal history of malignant neoplasm of brain: Secondary | ICD-10-CM | POA: Diagnosis not present

## 2017-08-27 DIAGNOSIS — Z6838 Body mass index (BMI) 38.0-38.9, adult: Secondary | ICD-10-CM | POA: Diagnosis not present

## 2017-08-27 DIAGNOSIS — E669 Obesity, unspecified: Secondary | ICD-10-CM | POA: Diagnosis not present

## 2017-08-27 DIAGNOSIS — G4733 Obstructive sleep apnea (adult) (pediatric): Secondary | ICD-10-CM | POA: Diagnosis not present

## 2017-08-27 DIAGNOSIS — I69351 Hemiplegia and hemiparesis following cerebral infarction affecting right dominant side: Secondary | ICD-10-CM | POA: Diagnosis not present

## 2017-08-27 DIAGNOSIS — Z791 Long term (current) use of non-steroidal anti-inflammatories (NSAID): Secondary | ICD-10-CM | POA: Diagnosis not present

## 2017-08-27 DIAGNOSIS — Z993 Dependence on wheelchair: Secondary | ICD-10-CM | POA: Diagnosis not present

## 2017-08-29 DIAGNOSIS — Z993 Dependence on wheelchair: Secondary | ICD-10-CM | POA: Diagnosis not present

## 2017-08-29 DIAGNOSIS — G40909 Epilepsy, unspecified, not intractable, without status epilepticus: Secondary | ICD-10-CM | POA: Diagnosis not present

## 2017-08-29 DIAGNOSIS — Z85841 Personal history of malignant neoplasm of brain: Secondary | ICD-10-CM | POA: Diagnosis not present

## 2017-08-29 DIAGNOSIS — Z791 Long term (current) use of non-steroidal anti-inflammatories (NSAID): Secondary | ICD-10-CM | POA: Diagnosis not present

## 2017-08-29 DIAGNOSIS — Z6838 Body mass index (BMI) 38.0-38.9, adult: Secondary | ICD-10-CM | POA: Diagnosis not present

## 2017-08-29 DIAGNOSIS — E669 Obesity, unspecified: Secondary | ICD-10-CM | POA: Diagnosis not present

## 2017-08-29 DIAGNOSIS — G4733 Obstructive sleep apnea (adult) (pediatric): Secondary | ICD-10-CM | POA: Diagnosis not present

## 2017-08-29 DIAGNOSIS — I69351 Hemiplegia and hemiparesis following cerebral infarction affecting right dominant side: Secondary | ICD-10-CM | POA: Diagnosis not present

## 2017-09-02 DIAGNOSIS — J9610 Chronic respiratory failure, unspecified whether with hypoxia or hypercapnia: Secondary | ICD-10-CM | POA: Diagnosis not present

## 2017-09-04 DIAGNOSIS — Z6838 Body mass index (BMI) 38.0-38.9, adult: Secondary | ICD-10-CM | POA: Diagnosis not present

## 2017-09-04 DIAGNOSIS — Z85841 Personal history of malignant neoplasm of brain: Secondary | ICD-10-CM | POA: Diagnosis not present

## 2017-09-04 DIAGNOSIS — I69351 Hemiplegia and hemiparesis following cerebral infarction affecting right dominant side: Secondary | ICD-10-CM | POA: Diagnosis not present

## 2017-09-04 DIAGNOSIS — G4733 Obstructive sleep apnea (adult) (pediatric): Secondary | ICD-10-CM | POA: Diagnosis not present

## 2017-09-04 DIAGNOSIS — Z791 Long term (current) use of non-steroidal anti-inflammatories (NSAID): Secondary | ICD-10-CM | POA: Diagnosis not present

## 2017-09-04 DIAGNOSIS — G40909 Epilepsy, unspecified, not intractable, without status epilepticus: Secondary | ICD-10-CM | POA: Diagnosis not present

## 2017-09-04 DIAGNOSIS — Z993 Dependence on wheelchair: Secondary | ICD-10-CM | POA: Diagnosis not present

## 2017-09-04 DIAGNOSIS — E669 Obesity, unspecified: Secondary | ICD-10-CM | POA: Diagnosis not present

## 2017-09-05 DIAGNOSIS — Z791 Long term (current) use of non-steroidal anti-inflammatories (NSAID): Secondary | ICD-10-CM | POA: Diagnosis not present

## 2017-09-05 DIAGNOSIS — Z85841 Personal history of malignant neoplasm of brain: Secondary | ICD-10-CM | POA: Diagnosis not present

## 2017-09-05 DIAGNOSIS — I69351 Hemiplegia and hemiparesis following cerebral infarction affecting right dominant side: Secondary | ICD-10-CM | POA: Diagnosis not present

## 2017-09-05 DIAGNOSIS — E669 Obesity, unspecified: Secondary | ICD-10-CM | POA: Diagnosis not present

## 2017-09-05 DIAGNOSIS — G40909 Epilepsy, unspecified, not intractable, without status epilepticus: Secondary | ICD-10-CM | POA: Diagnosis not present

## 2017-09-05 DIAGNOSIS — Z6838 Body mass index (BMI) 38.0-38.9, adult: Secondary | ICD-10-CM | POA: Diagnosis not present

## 2017-09-05 DIAGNOSIS — Z993 Dependence on wheelchair: Secondary | ICD-10-CM | POA: Diagnosis not present

## 2017-09-05 DIAGNOSIS — G4733 Obstructive sleep apnea (adult) (pediatric): Secondary | ICD-10-CM | POA: Diagnosis not present

## 2017-09-09 DIAGNOSIS — I69351 Hemiplegia and hemiparesis following cerebral infarction affecting right dominant side: Secondary | ICD-10-CM | POA: Diagnosis not present

## 2017-09-09 DIAGNOSIS — G4733 Obstructive sleep apnea (adult) (pediatric): Secondary | ICD-10-CM | POA: Diagnosis not present

## 2017-09-09 DIAGNOSIS — Z993 Dependence on wheelchair: Secondary | ICD-10-CM | POA: Diagnosis not present

## 2017-09-09 DIAGNOSIS — E669 Obesity, unspecified: Secondary | ICD-10-CM | POA: Diagnosis not present

## 2017-09-09 DIAGNOSIS — G40909 Epilepsy, unspecified, not intractable, without status epilepticus: Secondary | ICD-10-CM | POA: Diagnosis not present

## 2017-09-09 DIAGNOSIS — Z85841 Personal history of malignant neoplasm of brain: Secondary | ICD-10-CM | POA: Diagnosis not present

## 2017-09-09 DIAGNOSIS — Z6838 Body mass index (BMI) 38.0-38.9, adult: Secondary | ICD-10-CM | POA: Diagnosis not present

## 2017-09-09 DIAGNOSIS — Z791 Long term (current) use of non-steroidal anti-inflammatories (NSAID): Secondary | ICD-10-CM | POA: Diagnosis not present

## 2017-09-11 DIAGNOSIS — Z85841 Personal history of malignant neoplasm of brain: Secondary | ICD-10-CM | POA: Diagnosis not present

## 2017-09-11 DIAGNOSIS — I69351 Hemiplegia and hemiparesis following cerebral infarction affecting right dominant side: Secondary | ICD-10-CM | POA: Diagnosis not present

## 2017-09-11 DIAGNOSIS — G40909 Epilepsy, unspecified, not intractable, without status epilepticus: Secondary | ICD-10-CM | POA: Diagnosis not present

## 2017-09-11 DIAGNOSIS — G4733 Obstructive sleep apnea (adult) (pediatric): Secondary | ICD-10-CM | POA: Diagnosis not present

## 2017-09-11 DIAGNOSIS — Z993 Dependence on wheelchair: Secondary | ICD-10-CM | POA: Diagnosis not present

## 2017-09-11 DIAGNOSIS — Z6838 Body mass index (BMI) 38.0-38.9, adult: Secondary | ICD-10-CM | POA: Diagnosis not present

## 2017-09-11 DIAGNOSIS — E669 Obesity, unspecified: Secondary | ICD-10-CM | POA: Diagnosis not present

## 2017-09-11 DIAGNOSIS — Z791 Long term (current) use of non-steroidal anti-inflammatories (NSAID): Secondary | ICD-10-CM | POA: Diagnosis not present

## 2017-09-13 ENCOUNTER — Telehealth: Payer: Self-pay | Admitting: Family Medicine

## 2017-09-13 ENCOUNTER — Telehealth: Payer: Self-pay | Admitting: *Deleted

## 2017-09-13 DIAGNOSIS — E669 Obesity, unspecified: Secondary | ICD-10-CM | POA: Diagnosis not present

## 2017-09-13 DIAGNOSIS — Z85841 Personal history of malignant neoplasm of brain: Secondary | ICD-10-CM | POA: Diagnosis not present

## 2017-09-13 DIAGNOSIS — G4733 Obstructive sleep apnea (adult) (pediatric): Secondary | ICD-10-CM | POA: Diagnosis not present

## 2017-09-13 DIAGNOSIS — Z6838 Body mass index (BMI) 38.0-38.9, adult: Secondary | ICD-10-CM | POA: Diagnosis not present

## 2017-09-13 DIAGNOSIS — I69351 Hemiplegia and hemiparesis following cerebral infarction affecting right dominant side: Secondary | ICD-10-CM | POA: Diagnosis not present

## 2017-09-13 DIAGNOSIS — Z791 Long term (current) use of non-steroidal anti-inflammatories (NSAID): Secondary | ICD-10-CM | POA: Diagnosis not present

## 2017-09-13 DIAGNOSIS — G40909 Epilepsy, unspecified, not intractable, without status epilepticus: Secondary | ICD-10-CM | POA: Diagnosis not present

## 2017-09-13 DIAGNOSIS — Z993 Dependence on wheelchair: Secondary | ICD-10-CM | POA: Diagnosis not present

## 2017-09-13 NOTE — Telephone Encounter (Signed)
Audrey Turner, OT with [facility] asks for verbal orders for: 1) Recertification of OT 2 x / 4 2) Social Worker for financial issues 3) Speech Order for delayed responses

## 2017-09-13 NOTE — Telephone Encounter (Signed)
Stacy with [facility] advised.

## 2017-09-13 NOTE — Telephone Encounter (Signed)
Please give the order.  Thanks.   

## 2017-09-13 NOTE — Telephone Encounter (Signed)
Esther with [facility] advised.

## 2017-09-13 NOTE — Telephone Encounter (Signed)
Copied from [place] [phone]. Topic: Quick Communication - See Telephone Encounter >> Sep 13, 2017  3:42 PM Robina Ade, Helene Kelp D wrote: CRM for notification. See Telephone encounter for: 09/13/17. Marcelle Smiling PT with [facility] called to get verbal order for patient for re-certification for all services,OT and speech. She can be reached at [phone].

## 2017-09-17 DIAGNOSIS — F070 Personality change due to known physiological condition: Secondary | ICD-10-CM | POA: Diagnosis not present

## 2017-09-17 DIAGNOSIS — F0789 Other personality and behavioral disorders due to known physiological condition: Secondary | ICD-10-CM | POA: Diagnosis not present

## 2017-09-17 DIAGNOSIS — C711 Malignant neoplasm of frontal lobe: Secondary | ICD-10-CM | POA: Diagnosis not present

## 2017-09-17 DIAGNOSIS — F09 Unspecified mental disorder due to known physiological condition: Secondary | ICD-10-CM | POA: Diagnosis not present

## 2017-09-17 DIAGNOSIS — R69 Illness, unspecified: Secondary | ICD-10-CM | POA: Diagnosis not present

## 2017-09-18 DIAGNOSIS — E669 Obesity, unspecified: Secondary | ICD-10-CM | POA: Diagnosis not present

## 2017-09-18 DIAGNOSIS — Z85841 Personal history of malignant neoplasm of brain: Secondary | ICD-10-CM | POA: Diagnosis not present

## 2017-09-18 DIAGNOSIS — Z993 Dependence on wheelchair: Secondary | ICD-10-CM | POA: Diagnosis not present

## 2017-09-18 DIAGNOSIS — G40909 Epilepsy, unspecified, not intractable, without status epilepticus: Secondary | ICD-10-CM | POA: Diagnosis not present

## 2017-09-18 DIAGNOSIS — Z6838 Body mass index (BMI) 38.0-38.9, adult: Secondary | ICD-10-CM | POA: Diagnosis not present

## 2017-09-18 DIAGNOSIS — I69351 Hemiplegia and hemiparesis following cerebral infarction affecting right dominant side: Secondary | ICD-10-CM | POA: Diagnosis not present

## 2017-09-18 DIAGNOSIS — G4733 Obstructive sleep apnea (adult) (pediatric): Secondary | ICD-10-CM | POA: Diagnosis not present

## 2017-09-18 DIAGNOSIS — Z791 Long term (current) use of non-steroidal anti-inflammatories (NSAID): Secondary | ICD-10-CM | POA: Diagnosis not present

## 2017-09-19 ENCOUNTER — Ambulatory Visit: Payer: Medicare HMO | Admitting: Internal Medicine

## 2017-09-23 DIAGNOSIS — Z6838 Body mass index (BMI) 38.0-38.9, adult: Secondary | ICD-10-CM | POA: Diagnosis not present

## 2017-09-23 DIAGNOSIS — Z791 Long term (current) use of non-steroidal anti-inflammatories (NSAID): Secondary | ICD-10-CM | POA: Diagnosis not present

## 2017-09-23 DIAGNOSIS — I69351 Hemiplegia and hemiparesis following cerebral infarction affecting right dominant side: Secondary | ICD-10-CM | POA: Diagnosis not present

## 2017-09-23 DIAGNOSIS — G4733 Obstructive sleep apnea (adult) (pediatric): Secondary | ICD-10-CM | POA: Diagnosis not present

## 2017-09-23 DIAGNOSIS — G40909 Epilepsy, unspecified, not intractable, without status epilepticus: Secondary | ICD-10-CM | POA: Diagnosis not present

## 2017-09-23 DIAGNOSIS — E669 Obesity, unspecified: Secondary | ICD-10-CM | POA: Diagnosis not present

## 2017-09-23 DIAGNOSIS — Z85841 Personal history of malignant neoplasm of brain: Secondary | ICD-10-CM | POA: Diagnosis not present

## 2017-09-23 DIAGNOSIS — Z993 Dependence on wheelchair: Secondary | ICD-10-CM | POA: Diagnosis not present

## 2017-09-25 DIAGNOSIS — Z85841 Personal history of malignant neoplasm of brain: Secondary | ICD-10-CM | POA: Diagnosis not present

## 2017-09-25 DIAGNOSIS — Z791 Long term (current) use of non-steroidal anti-inflammatories (NSAID): Secondary | ICD-10-CM | POA: Diagnosis not present

## 2017-09-25 DIAGNOSIS — Z993 Dependence on wheelchair: Secondary | ICD-10-CM | POA: Diagnosis not present

## 2017-09-25 DIAGNOSIS — G40909 Epilepsy, unspecified, not intractable, without status epilepticus: Secondary | ICD-10-CM | POA: Diagnosis not present

## 2017-09-25 DIAGNOSIS — I69351 Hemiplegia and hemiparesis following cerebral infarction affecting right dominant side: Secondary | ICD-10-CM | POA: Diagnosis not present

## 2017-09-25 DIAGNOSIS — G4733 Obstructive sleep apnea (adult) (pediatric): Secondary | ICD-10-CM | POA: Diagnosis not present

## 2017-09-25 DIAGNOSIS — Z6838 Body mass index (BMI) 38.0-38.9, adult: Secondary | ICD-10-CM | POA: Diagnosis not present

## 2017-09-25 DIAGNOSIS — E669 Obesity, unspecified: Secondary | ICD-10-CM | POA: Diagnosis not present

## 2017-09-26 ENCOUNTER — Telehealth: Payer: Self-pay | Admitting: Family Medicine

## 2017-09-26 NOTE — Telephone Encounter (Signed)
Left detailed message on voicemail of Audrey Turner.

## 2017-09-26 NOTE — Telephone Encounter (Signed)
Copied from [place] [phone]. Topic: Quick Communication - See Telephone Encounter >> Sep 26, 2017  8:51 AM Ivar Drape wrote: CRM for notification. See Telephone encounter for: 09/26/17. [name] Glory Buff a Speech Therapist w/[facility] [phone] would like a verbal order Home Health Speech Therapy  1w1, 2w4 and approval to take orders from her Neurologist, Dr. Carles Collet.

## 2017-09-26 NOTE — Telephone Encounter (Signed)
Please give the order.  Thanks.   

## 2017-10-02 DIAGNOSIS — Z85841 Personal history of malignant neoplasm of brain: Secondary | ICD-10-CM | POA: Diagnosis not present

## 2017-10-02 DIAGNOSIS — Z993 Dependence on wheelchair: Secondary | ICD-10-CM | POA: Diagnosis not present

## 2017-10-02 DIAGNOSIS — G4733 Obstructive sleep apnea (adult) (pediatric): Secondary | ICD-10-CM | POA: Diagnosis not present

## 2017-10-02 DIAGNOSIS — Z6838 Body mass index (BMI) 38.0-38.9, adult: Secondary | ICD-10-CM | POA: Diagnosis not present

## 2017-10-02 DIAGNOSIS — G40909 Epilepsy, unspecified, not intractable, without status epilepticus: Secondary | ICD-10-CM | POA: Diagnosis not present

## 2017-10-02 DIAGNOSIS — E669 Obesity, unspecified: Secondary | ICD-10-CM | POA: Diagnosis not present

## 2017-10-02 DIAGNOSIS — I69351 Hemiplegia and hemiparesis following cerebral infarction affecting right dominant side: Secondary | ICD-10-CM | POA: Diagnosis not present

## 2017-10-02 DIAGNOSIS — Z791 Long term (current) use of non-steroidal anti-inflammatories (NSAID): Secondary | ICD-10-CM | POA: Diagnosis not present

## 2017-10-03 DIAGNOSIS — J9610 Chronic respiratory failure, unspecified whether with hypoxia or hypercapnia: Secondary | ICD-10-CM | POA: Diagnosis not present

## 2017-10-04 DIAGNOSIS — I69351 Hemiplegia and hemiparesis following cerebral infarction affecting right dominant side: Secondary | ICD-10-CM | POA: Diagnosis not present

## 2017-10-04 DIAGNOSIS — Z791 Long term (current) use of non-steroidal anti-inflammatories (NSAID): Secondary | ICD-10-CM | POA: Diagnosis not present

## 2017-10-04 DIAGNOSIS — Z6838 Body mass index (BMI) 38.0-38.9, adult: Secondary | ICD-10-CM | POA: Diagnosis not present

## 2017-10-04 DIAGNOSIS — E669 Obesity, unspecified: Secondary | ICD-10-CM | POA: Diagnosis not present

## 2017-10-04 DIAGNOSIS — Z993 Dependence on wheelchair: Secondary | ICD-10-CM | POA: Diagnosis not present

## 2017-10-04 DIAGNOSIS — G4733 Obstructive sleep apnea (adult) (pediatric): Secondary | ICD-10-CM | POA: Diagnosis not present

## 2017-10-04 DIAGNOSIS — Z85841 Personal history of malignant neoplasm of brain: Secondary | ICD-10-CM | POA: Diagnosis not present

## 2017-10-04 DIAGNOSIS — G40909 Epilepsy, unspecified, not intractable, without status epilepticus: Secondary | ICD-10-CM | POA: Diagnosis not present

## 2017-10-07 DIAGNOSIS — Z85841 Personal history of malignant neoplasm of brain: Secondary | ICD-10-CM | POA: Diagnosis not present

## 2017-10-07 DIAGNOSIS — Z993 Dependence on wheelchair: Secondary | ICD-10-CM | POA: Diagnosis not present

## 2017-10-07 DIAGNOSIS — G40909 Epilepsy, unspecified, not intractable, without status epilepticus: Secondary | ICD-10-CM | POA: Diagnosis not present

## 2017-10-07 DIAGNOSIS — G4733 Obstructive sleep apnea (adult) (pediatric): Secondary | ICD-10-CM | POA: Diagnosis not present

## 2017-10-07 DIAGNOSIS — Z6838 Body mass index (BMI) 38.0-38.9, adult: Secondary | ICD-10-CM | POA: Diagnosis not present

## 2017-10-07 DIAGNOSIS — Z791 Long term (current) use of non-steroidal anti-inflammatories (NSAID): Secondary | ICD-10-CM | POA: Diagnosis not present

## 2017-10-07 DIAGNOSIS — I69351 Hemiplegia and hemiparesis following cerebral infarction affecting right dominant side: Secondary | ICD-10-CM | POA: Diagnosis not present

## 2017-10-07 DIAGNOSIS — E669 Obesity, unspecified: Secondary | ICD-10-CM | POA: Diagnosis not present

## 2017-10-08 ENCOUNTER — Telehealth: Payer: Self-pay | Admitting: Family Medicine

## 2017-10-08 NOTE — Telephone Encounter (Signed)
Copied from [place] [phone]. Topic: Quick Communication - See Telephone Encounter >> Oct 08, 2017  2:05 PM Bea Graff, NT wrote: CRM for notification. See Telephone encounter for: 10/08/17. [name], speech therapy with [facility] calling to let the dr know she had to cancel a visit with the patient due to [name] being sick last week. CB#: [phone]

## 2017-10-09 DIAGNOSIS — Z6838 Body mass index (BMI) 38.0-38.9, adult: Secondary | ICD-10-CM | POA: Diagnosis not present

## 2017-10-09 DIAGNOSIS — Z85841 Personal history of malignant neoplasm of brain: Secondary | ICD-10-CM | POA: Diagnosis not present

## 2017-10-09 DIAGNOSIS — Z993 Dependence on wheelchair: Secondary | ICD-10-CM | POA: Diagnosis not present

## 2017-10-09 DIAGNOSIS — E669 Obesity, unspecified: Secondary | ICD-10-CM | POA: Diagnosis not present

## 2017-10-09 DIAGNOSIS — G40909 Epilepsy, unspecified, not intractable, without status epilepticus: Secondary | ICD-10-CM | POA: Diagnosis not present

## 2017-10-09 DIAGNOSIS — Z791 Long term (current) use of non-steroidal anti-inflammatories (NSAID): Secondary | ICD-10-CM | POA: Diagnosis not present

## 2017-10-09 DIAGNOSIS — G4733 Obstructive sleep apnea (adult) (pediatric): Secondary | ICD-10-CM | POA: Diagnosis not present

## 2017-10-09 DIAGNOSIS — I69351 Hemiplegia and hemiparesis following cerebral infarction affecting right dominant side: Secondary | ICD-10-CM | POA: Diagnosis not present

## 2017-10-09 NOTE — Telephone Encounter (Signed)
Noted. Thanks.

## 2017-10-16 DIAGNOSIS — E669 Obesity, unspecified: Secondary | ICD-10-CM | POA: Diagnosis not present

## 2017-10-16 DIAGNOSIS — Z85841 Personal history of malignant neoplasm of brain: Secondary | ICD-10-CM | POA: Diagnosis not present

## 2017-10-16 DIAGNOSIS — I69351 Hemiplegia and hemiparesis following cerebral infarction affecting right dominant side: Secondary | ICD-10-CM | POA: Diagnosis not present

## 2017-10-16 DIAGNOSIS — G40909 Epilepsy, unspecified, not intractable, without status epilepticus: Secondary | ICD-10-CM | POA: Diagnosis not present

## 2017-10-16 DIAGNOSIS — Z993 Dependence on wheelchair: Secondary | ICD-10-CM | POA: Diagnosis not present

## 2017-10-16 DIAGNOSIS — G4733 Obstructive sleep apnea (adult) (pediatric): Secondary | ICD-10-CM | POA: Diagnosis not present

## 2017-10-16 DIAGNOSIS — Z6838 Body mass index (BMI) 38.0-38.9, adult: Secondary | ICD-10-CM | POA: Diagnosis not present

## 2017-10-16 DIAGNOSIS — Z791 Long term (current) use of non-steroidal anti-inflammatories (NSAID): Secondary | ICD-10-CM | POA: Diagnosis not present

## 2017-10-17 ENCOUNTER — Telehealth: Payer: Self-pay | Admitting: *Deleted

## 2017-10-17 DIAGNOSIS — R29898 Other symptoms and signs involving the musculoskeletal system: Secondary | ICD-10-CM

## 2017-10-17 NOTE — Telephone Encounter (Signed)
Copied from [place] [phone]. Topic: Quick Communication - See Telephone Encounter >> Oct 17, 2017  3:10 PM Antonieta Iba C wrote: CRM for notification. See Telephone encounter for: 10/17/17.  Fernando Salinas w/ [facility] - [phone]   Called in to make provider aware that she and pt has plato with PT. They would like to know if provider would put in a referral to oupt PT at [facility]. She feels that pt would benefit from it.

## 2017-10-18 DIAGNOSIS — G4733 Obstructive sleep apnea (adult) (pediatric): Secondary | ICD-10-CM | POA: Diagnosis not present

## 2017-10-18 DIAGNOSIS — I69351 Hemiplegia and hemiparesis following cerebral infarction affecting right dominant side: Secondary | ICD-10-CM | POA: Diagnosis not present

## 2017-10-18 DIAGNOSIS — Z85841 Personal history of malignant neoplasm of brain: Secondary | ICD-10-CM | POA: Diagnosis not present

## 2017-10-18 DIAGNOSIS — E669 Obesity, unspecified: Secondary | ICD-10-CM | POA: Diagnosis not present

## 2017-10-18 DIAGNOSIS — Z993 Dependence on wheelchair: Secondary | ICD-10-CM | POA: Diagnosis not present

## 2017-10-18 DIAGNOSIS — Z6838 Body mass index (BMI) 38.0-38.9, adult: Secondary | ICD-10-CM | POA: Diagnosis not present

## 2017-10-18 DIAGNOSIS — Z791 Long term (current) use of non-steroidal anti-inflammatories (NSAID): Secondary | ICD-10-CM | POA: Diagnosis not present

## 2017-10-18 DIAGNOSIS — G40909 Epilepsy, unspecified, not intractable, without status epilepticus: Secondary | ICD-10-CM | POA: Diagnosis not present

## 2017-10-18 NOTE — Telephone Encounter (Signed)
Ordered. Thanks

## 2017-10-18 NOTE — Telephone Encounter (Signed)
Placed Referral on [place]. Notified patients niece that REferral was sent over and they could call directly to schedule. Left message on patients cell.

## 2017-10-18 NOTE — Addendum Note (Signed)
Addended by: Tonia Ghent on: 10/18/2017 06:23 AM   Modules accepted: Orders

## 2017-10-21 DIAGNOSIS — G40909 Epilepsy, unspecified, not intractable, without status epilepticus: Secondary | ICD-10-CM | POA: Diagnosis not present

## 2017-10-21 DIAGNOSIS — I69351 Hemiplegia and hemiparesis following cerebral infarction affecting right dominant side: Secondary | ICD-10-CM | POA: Diagnosis not present

## 2017-10-21 DIAGNOSIS — G4733 Obstructive sleep apnea (adult) (pediatric): Secondary | ICD-10-CM | POA: Diagnosis not present

## 2017-10-21 DIAGNOSIS — Z85841 Personal history of malignant neoplasm of brain: Secondary | ICD-10-CM | POA: Diagnosis not present

## 2017-10-21 DIAGNOSIS — Z6838 Body mass index (BMI) 38.0-38.9, adult: Secondary | ICD-10-CM | POA: Diagnosis not present

## 2017-10-21 DIAGNOSIS — Z791 Long term (current) use of non-steroidal anti-inflammatories (NSAID): Secondary | ICD-10-CM | POA: Diagnosis not present

## 2017-10-21 DIAGNOSIS — E669 Obesity, unspecified: Secondary | ICD-10-CM | POA: Diagnosis not present

## 2017-10-21 DIAGNOSIS — Z993 Dependence on wheelchair: Secondary | ICD-10-CM | POA: Diagnosis not present

## 2017-10-24 ENCOUNTER — Telehealth: Payer: Self-pay | Admitting: Family Medicine

## 2017-10-24 NOTE — Telephone Encounter (Signed)
Copied from [place] [phone]. Topic: General - Other >> Oct 24, 2017  9:27 AM Carolyn Stare wrote:  [name] a speech therapy with [facility] call to say pt is going out of town and will cancel 1 of her visits for this week. [name] is asking for verbal orders move cancel visit to next week and add 2 more visits the week after that   [phone]

## 2017-10-24 NOTE — Telephone Encounter (Signed)
Ok to do this. Thanks. 

## 2017-10-24 NOTE — Telephone Encounter (Signed)
Left message for [name] with [facility] notifying her Dr. Darnell Level is ok with changing pt's visits.

## 2017-10-25 NOTE — Telephone Encounter (Signed)
Yes, please give the order.  Thanks.  

## 2017-10-25 NOTE — Telephone Encounter (Signed)
[name] advised.  

## 2017-11-01 DIAGNOSIS — I69351 Hemiplegia and hemiparesis following cerebral infarction affecting right dominant side: Secondary | ICD-10-CM | POA: Diagnosis not present

## 2017-11-01 DIAGNOSIS — Z791 Long term (current) use of non-steroidal anti-inflammatories (NSAID): Secondary | ICD-10-CM | POA: Diagnosis not present

## 2017-11-01 DIAGNOSIS — Z6838 Body mass index (BMI) 38.0-38.9, adult: Secondary | ICD-10-CM | POA: Diagnosis not present

## 2017-11-01 DIAGNOSIS — Z85841 Personal history of malignant neoplasm of brain: Secondary | ICD-10-CM | POA: Diagnosis not present

## 2017-11-01 DIAGNOSIS — Z993 Dependence on wheelchair: Secondary | ICD-10-CM | POA: Diagnosis not present

## 2017-11-01 DIAGNOSIS — E669 Obesity, unspecified: Secondary | ICD-10-CM | POA: Diagnosis not present

## 2017-11-01 DIAGNOSIS — G40909 Epilepsy, unspecified, not intractable, without status epilepticus: Secondary | ICD-10-CM | POA: Diagnosis not present

## 2017-11-01 DIAGNOSIS — G4733 Obstructive sleep apnea (adult) (pediatric): Secondary | ICD-10-CM | POA: Diagnosis not present

## 2017-11-02 DIAGNOSIS — J9610 Chronic respiratory failure, unspecified whether with hypoxia or hypercapnia: Secondary | ICD-10-CM | POA: Diagnosis not present

## 2017-11-05 DIAGNOSIS — I69351 Hemiplegia and hemiparesis following cerebral infarction affecting right dominant side: Secondary | ICD-10-CM | POA: Diagnosis not present

## 2017-11-05 DIAGNOSIS — Z993 Dependence on wheelchair: Secondary | ICD-10-CM | POA: Diagnosis not present

## 2017-11-05 DIAGNOSIS — G40909 Epilepsy, unspecified, not intractable, without status epilepticus: Secondary | ICD-10-CM | POA: Diagnosis not present

## 2017-11-05 DIAGNOSIS — Z791 Long term (current) use of non-steroidal anti-inflammatories (NSAID): Secondary | ICD-10-CM | POA: Diagnosis not present

## 2017-11-05 DIAGNOSIS — G4733 Obstructive sleep apnea (adult) (pediatric): Secondary | ICD-10-CM | POA: Diagnosis not present

## 2017-11-05 DIAGNOSIS — Z85841 Personal history of malignant neoplasm of brain: Secondary | ICD-10-CM | POA: Diagnosis not present

## 2017-11-05 DIAGNOSIS — E669 Obesity, unspecified: Secondary | ICD-10-CM | POA: Diagnosis not present

## 2017-11-05 DIAGNOSIS — Z6838 Body mass index (BMI) 38.0-38.9, adult: Secondary | ICD-10-CM | POA: Diagnosis not present

## 2017-11-07 DIAGNOSIS — E669 Obesity, unspecified: Secondary | ICD-10-CM | POA: Diagnosis not present

## 2017-11-07 DIAGNOSIS — G40909 Epilepsy, unspecified, not intractable, without status epilepticus: Secondary | ICD-10-CM | POA: Diagnosis not present

## 2017-11-07 DIAGNOSIS — Z791 Long term (current) use of non-steroidal anti-inflammatories (NSAID): Secondary | ICD-10-CM | POA: Diagnosis not present

## 2017-11-07 DIAGNOSIS — Z993 Dependence on wheelchair: Secondary | ICD-10-CM | POA: Diagnosis not present

## 2017-11-07 DIAGNOSIS — I69351 Hemiplegia and hemiparesis following cerebral infarction affecting right dominant side: Secondary | ICD-10-CM | POA: Diagnosis not present

## 2017-11-07 DIAGNOSIS — Z85841 Personal history of malignant neoplasm of brain: Secondary | ICD-10-CM | POA: Diagnosis not present

## 2017-11-07 DIAGNOSIS — G4733 Obstructive sleep apnea (adult) (pediatric): Secondary | ICD-10-CM | POA: Diagnosis not present

## 2017-11-07 DIAGNOSIS — Z6838 Body mass index (BMI) 38.0-38.9, adult: Secondary | ICD-10-CM | POA: Diagnosis not present

## 2017-11-08 ENCOUNTER — Telehealth: Payer: Self-pay | Admitting: Family Medicine

## 2017-11-08 NOTE — Telephone Encounter (Signed)
Please give the order.  Thanks.   

## 2017-11-08 NOTE — Telephone Encounter (Signed)
Copied from [place] [phone]. Topic: General - Other >> Nov 08, 2017 11:09 AM Carolyn Stare wrote:  [name] with [facility] call to ask for verbal orders home health speech therapy 2 x 4   [phone]

## 2017-11-08 NOTE — Telephone Encounter (Signed)
[name] Advised.

## 2017-11-12 DIAGNOSIS — Z6838 Body mass index (BMI) 38.0-38.9, adult: Secondary | ICD-10-CM | POA: Diagnosis not present

## 2017-11-12 DIAGNOSIS — G4733 Obstructive sleep apnea (adult) (pediatric): Secondary | ICD-10-CM | POA: Diagnosis not present

## 2017-11-12 DIAGNOSIS — E669 Obesity, unspecified: Secondary | ICD-10-CM | POA: Diagnosis not present

## 2017-11-12 DIAGNOSIS — Z993 Dependence on wheelchair: Secondary | ICD-10-CM | POA: Diagnosis not present

## 2017-11-12 DIAGNOSIS — Z791 Long term (current) use of non-steroidal anti-inflammatories (NSAID): Secondary | ICD-10-CM | POA: Diagnosis not present

## 2017-11-12 DIAGNOSIS — Z85841 Personal history of malignant neoplasm of brain: Secondary | ICD-10-CM | POA: Diagnosis not present

## 2017-11-12 DIAGNOSIS — I69351 Hemiplegia and hemiparesis following cerebral infarction affecting right dominant side: Secondary | ICD-10-CM | POA: Diagnosis not present

## 2017-11-12 DIAGNOSIS — G40909 Epilepsy, unspecified, not intractable, without status epilepticus: Secondary | ICD-10-CM | POA: Diagnosis not present

## 2017-11-14 DIAGNOSIS — R69 Illness, unspecified: Secondary | ICD-10-CM | POA: Diagnosis not present

## 2017-11-14 DIAGNOSIS — F09 Unspecified mental disorder due to known physiological condition: Secondary | ICD-10-CM | POA: Diagnosis not present

## 2017-11-14 DIAGNOSIS — C711 Malignant neoplasm of frontal lobe: Secondary | ICD-10-CM | POA: Diagnosis not present

## 2017-11-14 DIAGNOSIS — F339 Major depressive disorder, recurrent, unspecified: Secondary | ICD-10-CM | POA: Diagnosis not present

## 2017-11-14 DIAGNOSIS — F0789 Other personality and behavioral disorders due to known physiological condition: Secondary | ICD-10-CM | POA: Diagnosis not present

## 2017-11-15 DIAGNOSIS — Z85841 Personal history of malignant neoplasm of brain: Secondary | ICD-10-CM | POA: Diagnosis not present

## 2017-11-15 DIAGNOSIS — Z791 Long term (current) use of non-steroidal anti-inflammatories (NSAID): Secondary | ICD-10-CM | POA: Diagnosis not present

## 2017-11-15 DIAGNOSIS — Z993 Dependence on wheelchair: Secondary | ICD-10-CM | POA: Diagnosis not present

## 2017-11-15 DIAGNOSIS — G4733 Obstructive sleep apnea (adult) (pediatric): Secondary | ICD-10-CM | POA: Diagnosis not present

## 2017-11-15 DIAGNOSIS — Z6838 Body mass index (BMI) 38.0-38.9, adult: Secondary | ICD-10-CM | POA: Diagnosis not present

## 2017-11-15 DIAGNOSIS — G40909 Epilepsy, unspecified, not intractable, without status epilepticus: Secondary | ICD-10-CM | POA: Diagnosis not present

## 2017-11-15 DIAGNOSIS — I69351 Hemiplegia and hemiparesis following cerebral infarction affecting right dominant side: Secondary | ICD-10-CM | POA: Diagnosis not present

## 2017-11-15 DIAGNOSIS — E669 Obesity, unspecified: Secondary | ICD-10-CM | POA: Diagnosis not present

## 2017-11-19 ENCOUNTER — Telehealth: Payer: Self-pay | Admitting: Family Medicine

## 2017-11-19 NOTE — Telephone Encounter (Signed)
Copied from [place] [id number]. Topic: Quick Communication - See Telephone Encounter >> Nov 19, 2017  3:30 PM Gardiner Ramus wrote: CRM for notification. See Telephone encounter for: 11/19/17. [name] from [facility] called and stated that pt brother called and canceled  speech therapy visit because the pt was not felling well.

## 2017-11-20 NOTE — Telephone Encounter (Signed)
[name] notified as instructed by telephone.

## 2017-11-20 NOTE — Telephone Encounter (Signed)
Noted. Thanks.  Update me as needed.

## 2017-11-21 NOTE — Telephone Encounter (Signed)
[name] speech therapist w/[facility] [phone] stated that the patient is still not feeling well so they cancelled today's visit as well.  Next week they have requested to cancel one of the visits because they will be out of town. So [name] needs verbal orders to make up for those three visits at the end of the plan of care.

## 2017-11-22 NOTE — Telephone Encounter (Signed)
Please give the order.  Thanks.   

## 2017-11-22 NOTE — Telephone Encounter (Signed)
[name] with speech therapy, [facility] advised.

## 2017-11-27 DIAGNOSIS — E669 Obesity, unspecified: Secondary | ICD-10-CM | POA: Diagnosis not present

## 2017-11-27 DIAGNOSIS — Z993 Dependence on wheelchair: Secondary | ICD-10-CM | POA: Diagnosis not present

## 2017-11-27 DIAGNOSIS — Z6838 Body mass index (BMI) 38.0-38.9, adult: Secondary | ICD-10-CM | POA: Diagnosis not present

## 2017-11-27 DIAGNOSIS — I69351 Hemiplegia and hemiparesis following cerebral infarction affecting right dominant side: Secondary | ICD-10-CM | POA: Diagnosis not present

## 2017-11-27 DIAGNOSIS — G4733 Obstructive sleep apnea (adult) (pediatric): Secondary | ICD-10-CM | POA: Diagnosis not present

## 2017-11-27 DIAGNOSIS — Z791 Long term (current) use of non-steroidal anti-inflammatories (NSAID): Secondary | ICD-10-CM | POA: Diagnosis not present

## 2017-11-27 DIAGNOSIS — G40909 Epilepsy, unspecified, not intractable, without status epilepticus: Secondary | ICD-10-CM | POA: Diagnosis not present

## 2017-11-27 DIAGNOSIS — Z85841 Personal history of malignant neoplasm of brain: Secondary | ICD-10-CM | POA: Diagnosis not present

## 2017-12-03 DIAGNOSIS — J9610 Chronic respiratory failure, unspecified whether with hypoxia or hypercapnia: Secondary | ICD-10-CM | POA: Diagnosis not present

## 2017-12-04 DIAGNOSIS — I69351 Hemiplegia and hemiparesis following cerebral infarction affecting right dominant side: Secondary | ICD-10-CM | POA: Diagnosis not present

## 2017-12-04 DIAGNOSIS — G40909 Epilepsy, unspecified, not intractable, without status epilepticus: Secondary | ICD-10-CM | POA: Diagnosis not present

## 2017-12-04 DIAGNOSIS — Z791 Long term (current) use of non-steroidal anti-inflammatories (NSAID): Secondary | ICD-10-CM | POA: Diagnosis not present

## 2017-12-04 DIAGNOSIS — G4733 Obstructive sleep apnea (adult) (pediatric): Secondary | ICD-10-CM | POA: Diagnosis not present

## 2017-12-04 DIAGNOSIS — Z6838 Body mass index (BMI) 38.0-38.9, adult: Secondary | ICD-10-CM | POA: Diagnosis not present

## 2017-12-04 DIAGNOSIS — Z993 Dependence on wheelchair: Secondary | ICD-10-CM | POA: Diagnosis not present

## 2017-12-04 DIAGNOSIS — Z85841 Personal history of malignant neoplasm of brain: Secondary | ICD-10-CM | POA: Diagnosis not present

## 2017-12-04 DIAGNOSIS — E669 Obesity, unspecified: Secondary | ICD-10-CM | POA: Diagnosis not present

## 2017-12-06 ENCOUNTER — Ambulatory Visit: Payer: Medicare HMO | Admitting: Internal Medicine

## 2017-12-09 DIAGNOSIS — Z85841 Personal history of malignant neoplasm of brain: Secondary | ICD-10-CM | POA: Diagnosis not present

## 2017-12-09 DIAGNOSIS — Z6838 Body mass index (BMI) 38.0-38.9, adult: Secondary | ICD-10-CM | POA: Diagnosis not present

## 2017-12-09 DIAGNOSIS — Z993 Dependence on wheelchair: Secondary | ICD-10-CM

## 2017-12-09 DIAGNOSIS — Z791 Long term (current) use of non-steroidal anti-inflammatories (NSAID): Secondary | ICD-10-CM | POA: Diagnosis not present

## 2017-12-09 DIAGNOSIS — I69351 Hemiplegia and hemiparesis following cerebral infarction affecting right dominant side: Secondary | ICD-10-CM | POA: Diagnosis not present

## 2017-12-09 DIAGNOSIS — E669 Obesity, unspecified: Secondary | ICD-10-CM | POA: Diagnosis not present

## 2017-12-09 DIAGNOSIS — G40909 Epilepsy, unspecified, not intractable, without status epilepticus: Secondary | ICD-10-CM | POA: Diagnosis not present

## 2017-12-09 DIAGNOSIS — G4733 Obstructive sleep apnea (adult) (pediatric): Secondary | ICD-10-CM | POA: Diagnosis not present

## 2017-12-11 ENCOUNTER — Telehealth: Payer: Self-pay | Admitting: Family Medicine

## 2017-12-11 NOTE — Telephone Encounter (Signed)
Please give the order.  Thanks.   

## 2017-12-11 NOTE — Telephone Encounter (Signed)
Copied from [place] [phone]. Topic: Quick Communication - See Telephone Encounter >> Dec 11, 2017  9:55 AM Bea Graff, NT wrote: CRM for notification. See Telephone encounter for: 12/11/17. [name] with [facility] speech therapy requesting verbal orders to make up a visit the pt cancelled last week. Verbal orders for a 1 time visit for 1 week. CB#: [phone]

## 2017-12-11 NOTE — Telephone Encounter (Signed)
Verbal order given to [name] as instructed.

## 2017-12-18 DIAGNOSIS — Z993 Dependence on wheelchair: Secondary | ICD-10-CM | POA: Diagnosis not present

## 2017-12-18 DIAGNOSIS — Z85841 Personal history of malignant neoplasm of brain: Secondary | ICD-10-CM | POA: Diagnosis not present

## 2017-12-18 DIAGNOSIS — E669 Obesity, unspecified: Secondary | ICD-10-CM | POA: Diagnosis not present

## 2017-12-18 DIAGNOSIS — G40909 Epilepsy, unspecified, not intractable, without status epilepticus: Secondary | ICD-10-CM | POA: Diagnosis not present

## 2017-12-18 DIAGNOSIS — I69351 Hemiplegia and hemiparesis following cerebral infarction affecting right dominant side: Secondary | ICD-10-CM | POA: Diagnosis not present

## 2017-12-18 DIAGNOSIS — G4733 Obstructive sleep apnea (adult) (pediatric): Secondary | ICD-10-CM | POA: Diagnosis not present

## 2017-12-18 DIAGNOSIS — Z6838 Body mass index (BMI) 38.0-38.9, adult: Secondary | ICD-10-CM | POA: Diagnosis not present

## 2017-12-18 DIAGNOSIS — Z791 Long term (current) use of non-steroidal anti-inflammatories (NSAID): Secondary | ICD-10-CM | POA: Diagnosis not present

## 2017-12-19 ENCOUNTER — Telehealth: Payer: Self-pay | Admitting: Family Medicine

## 2017-12-19 NOTE — Telephone Encounter (Signed)
Left detailed message on voicemail of [name] with [facility].

## 2017-12-19 NOTE — Telephone Encounter (Signed)
Please give the order.  Thanks.   

## 2017-12-19 NOTE — Telephone Encounter (Signed)
Copied from [place] [phone]. Topic: Quick Communication - See Telephone Encounter >> Dec 19, 2017 10:13 AM Reyne Dumas L wrote: CRM for notification. See Telephone encounter for: 12/19/17.  [name] from [facility] calling:  Usually pt is seen 2x a week.  Pt has asked for 2nd visit to be moved to next week - meaning one visit this week and one visit next week. [name] can be reached at [phone]

## 2017-12-26 NOTE — Telephone Encounter (Signed)
Please give the order.  Thanks.   

## 2017-12-26 NOTE — Telephone Encounter (Signed)
[name] with [facility] speech calling for verbal to move pt's ST visits to next week  1 visit  ([name] was sick this week and pt went out of town end of week)

## 2017-12-27 NOTE — Telephone Encounter (Signed)
[name] advised.  

## 2018-01-01 DIAGNOSIS — G4733 Obstructive sleep apnea (adult) (pediatric): Secondary | ICD-10-CM | POA: Diagnosis not present

## 2018-01-01 DIAGNOSIS — G40909 Epilepsy, unspecified, not intractable, without status epilepticus: Secondary | ICD-10-CM | POA: Diagnosis not present

## 2018-01-01 DIAGNOSIS — E669 Obesity, unspecified: Secondary | ICD-10-CM | POA: Diagnosis not present

## 2018-01-01 DIAGNOSIS — Z85841 Personal history of malignant neoplasm of brain: Secondary | ICD-10-CM | POA: Diagnosis not present

## 2018-01-01 DIAGNOSIS — Z993 Dependence on wheelchair: Secondary | ICD-10-CM | POA: Diagnosis not present

## 2018-01-01 DIAGNOSIS — Z6838 Body mass index (BMI) 38.0-38.9, adult: Secondary | ICD-10-CM | POA: Diagnosis not present

## 2018-01-01 DIAGNOSIS — I69351 Hemiplegia and hemiparesis following cerebral infarction affecting right dominant side: Secondary | ICD-10-CM | POA: Diagnosis not present

## 2018-01-01 DIAGNOSIS — Z791 Long term (current) use of non-steroidal anti-inflammatories (NSAID): Secondary | ICD-10-CM | POA: Diagnosis not present

## 2018-01-03 DIAGNOSIS — J9610 Chronic respiratory failure, unspecified whether with hypoxia or hypercapnia: Secondary | ICD-10-CM | POA: Diagnosis not present

## 2018-02-02 DIAGNOSIS — J9610 Chronic respiratory failure, unspecified whether with hypoxia or hypercapnia: Secondary | ICD-10-CM | POA: Diagnosis not present

## 2018-02-06 ENCOUNTER — Ambulatory Visit: Payer: Medicare HMO | Admitting: Internal Medicine

## 2018-02-28 ENCOUNTER — Telehealth: Payer: Self-pay | Admitting: Neurology

## 2018-02-28 ENCOUNTER — Other Ambulatory Visit: Payer: Self-pay

## 2018-02-28 ENCOUNTER — Encounter: Payer: Self-pay | Admitting: Neurology

## 2018-02-28 ENCOUNTER — Ambulatory Visit (INDEPENDENT_AMBULATORY_CARE_PROVIDER_SITE_OTHER): Payer: Medicare HMO | Admitting: Neurology

## 2018-02-28 VITALS — BP 128/70 | HR 69 | Ht 59.0 in | Wt 180.0 lb

## 2018-02-28 DIAGNOSIS — G40209 Localization-related (focal) (partial) symptomatic epilepsy and epileptic syndromes with complex partial seizures, not intractable, without status epilepticus: Secondary | ICD-10-CM

## 2018-02-28 DIAGNOSIS — Z85841 Personal history of malignant neoplasm of brain: Secondary | ICD-10-CM

## 2018-02-28 MED ORDER — LEVETIRACETAM 750 MG PO TABS: 750.0000 mg | ORAL_TABLET | Freq: Two times a day (BID) | ORAL | 3 refills | Status: DC

## 2018-02-28 NOTE — Telephone Encounter (Signed)
ERROR- Paperwork for a wheelchair is in [employer].

## 2018-02-28 NOTE — Patient Instructions (Signed)
Great seeing you! Continue Keppra 750mg  twice a day. Continue follow-up at [facility]. Follow-up in 1 year, call for any changes.  Seizure Precautions: 1. If medication has been prescribed for you to prevent seizures, take it exactly as directed.  Do not stop taking the medicine without talking to your doctor first, even if you have not had a seizure in a long time.   2. Avoid activities in which a seizure would cause danger to yourself or to others.  Don't operate dangerous machinery, swim alone, or climb in high or dangerous places, such as on ladders, roofs, or girders.  Do not drive unless your doctor says you may.  3. If you have any warning that you may have a seizure, lay down in a safe place where you can't hurt yourself.    4.  No driving for 6 months from last seizure, as per [facility].   Please refer to the following link on the [facility] website for more information: http://www.epilepsyfoundation.org/answerplace/Social/driving/drivingu.cfm   5.  Maintain good sleep hygiene. Avoid alcohol.  6.  Contact your doctor if you have any problems that may be related to the medicine you are taking.  7.  Call 911 and bring the patient back to the ED if:        A.  The seizure lasts longer than 5 minutes.       B.  The patient doesn't awaken shortly after the seizure  C.  The patient has new problems such as difficulty seeing, speaking or moving  D.  The patient was injured during the seizure  E.  The patient has a temperature over 102 F (39C)  F.  The patient vomited and now is having trouble breathing

## 2018-02-28 NOTE — Progress Notes (Addendum)
NEUROLOGY FOLLOW UP OFFICE NOTE  Audrey Turner [id number] 09/07/1960  HISTORY OF PRESENT ILLNESS: I had the pleasure of seeing Audrey Turner in follow-up in the neurology clinic on 02/28/2018.  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 She is again accompanied by her brother who helps provide additional history today. Records and images were personally reviewed where available. Her last MRI brain with and without contrast at [facility] done 09/2017 showed stable exam without evidence of disease progression, post-op changes in the left frontal region with surrounding increased signal, no new enhancement, periventricular FLAIR signal unchanged, thin curvilinear enhancement along the dura on left frontal lobe unchanged. Her brother denies any seizures since February 2018 where she had an episode of right arm jerking. She is tolerating Keppra 750mg  BID without side effects. He denies any unresponsive episodes, sometimes she is "spacey" but would respond, he feels it is related to depression. She continues to see the Neuropsychologist at [facility] regularly. Right hand tremor is unchanged. She had PT and OT a few months ago which helped her, they are waiting for the holidays to finish and plan to go to outpatient rehab at [facility]. She lives with her brother who helps her with transfers. She had 2 slow falls with transfers the past year. Her brother reports she tends to lean to the left, but OT exercises have helped. She denies any headaches, dizziness, vision changes. The bowel incontinence appears to have stopped, her brother denies bowel incontinence, she has occasional bladder incontinence.  Diagnostic Data: 24-hour EEG which captured 2 episodes of being dazed, no EEG changes seen. EEG showed focal left hemisphere slowing, no epileptiform discharges. 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 There was left frontal lobe gliosis surrounding the resection cavity, additional patchy white matter changes. There were subcentimeter T2 bright cysts in the left corona radiata and right parietal lobe, favoring post-treatment changes.   HPI reviewed from Dr. Doristine Devoid note and per brother: This is a 57 year old right-handed woman with a history of left frontal astrocytoma s/p multiple resections. Her first resection was in October, 2007, followed by a repeat in August, 2008. She has been followed by [facility] for this. She is also being seen at [facility] by psychotherapy for frontal lobe syndrome/cognitive dysfunction. Her brother reports she did well for 5 years post-surgery, then for since 2014, she has been declining, to the point that he had to move in with her in July 2017. She was in in her usual state of health on 12/09/2015 when she and her brother went to the mattress store (had done PT before that). Upon exiting her vehicle her brother noted she suddenly stopped and could not move, then started having right arm jerking. Her brother states that her left arm shook some but not like the right. She was helped to the ground. Her eyes rolled back and lasted about 3 minutes. EMS was contacted and once shaking stopped and waking up, she had trouble moving the right arm. By the time she got to the hospital, the R arm was stronger and back to baseline. . Patient has no prior history of seizure. She was started on Keppra 500 mg twice a day. After her first seizure, she did not  follow-up with PT, her brother reported she does not follow PT advice. She has become mostly wheelchair-bound. She is alone during the day but brother lives with her at night. Brother states that she does nothing but watch TV during the day. Seems to get  confused/dazed by 8pm. Has EDS. Hx of OSA but refused CPAP years ago, she was Dr. Annamaria Boots who noted nocturnal hypoxemia. Brother also reported day/night reversal.  Treatment History via review of [facility] records:  01/10/06: Craniotomy and debulking performed at outside hospital revealing anaplastic astrocytoma. This was followed by Temozolomide and radiation and then six months of Temozolomide with last cycle of Temozolomide initiated on 08/10/06.  09/30/06: Radiographic evidence of new rim enhancing lesion butting off of the original resection cavity consistent with progressive disease.  10/15/06: New patient evaluation at [facility] [place]. 10/17/06: Craniotomy with resection of tumor with Dr. Derrill Memo, and pathology confirming anaplastic astrocytoma.  11/19/06: Patient initiated on Zactima, Imatinib and Hydroxyurea per protocol.  01/20/07: Patient has completed two cycles on protocol utilizing Zactima, Gleevec, and Hydrea.  02/24/07: Patient is now status post three cycles of Zactima/Gleevec/Hydrea on protocol. She has had a dose reduction for cycle 3 with Zactima at 100 mg p.o. DAILY and Gleevec 300 mg p.o. DAILY due to problems with diarrhea.  03/24/07: Patient is status post four cycles of therapy on protocol with Zactima, Gleevec and Hydrea.  05/19/07: Patient is status post five cycles of Zactima, Gleevec, and Hydroxyurea on protocol; last dose on 04/18/07 and has been off therapy since that time secondary to requiring surgery on her craniotomy site. We will await recommendations from Dr. Beverely Low in Plastic Surgery prior to continuing with therapy. She is clinically and radiographically stable at this time.  09/22/07: Patient is status post four cycles of VP-16 x 21 days per 28-day cycle.  05/26/08: Status post twelve cycles of Etoposide; off therapy.    PAST MEDICAL HISTORY: Past Medical History:  Diagnosis Date  . Acute sinusitis, unspecified   .  Astrocytoma [facility]) 01/2006 /09/2006   grade 3 atrocytoma s/p chemo/radiation/surgery at [place], in remission since at least 2011  . Cancer ([place])    Brain   . Diarrhea   . Drug induced neutropenia(288.03)   . Primary exertional headache   . Recurrent depression ([place])   . Vaginitis     MEDICATIONS: Current Outpatient Medications on File Prior to Visit  Medication Sig Dispense Refill  . acetaminophen (TYLENOL) 500 MG tablet Take 500 mg by mouth as needed.    . Amantadine HCl 100 MG tablet Take 1 tab qam and 1 tab qnoon    . ARIPiprazole (ABILIFY) 5 MG tablet Take 5 mg by mouth daily.    . benzocaine-resorcinol (VAGISIL) 5-2 % vaginal cream as needed as needed    . levETIRAcetam (KEPPRA) 750 MG tablet Take 1 tablet (750 mg total) by mouth 2 (two) times daily. 180 tablet 2  . loratadine-pseudoephedrine (CLARITIN-D 24 HOUR) 10-240 MG per 24 hr tablet Take 10-240 mg by mouth as needed.    . Multiple Vitamin (MULTIVITAMIN) capsule Take 1 capsule by mouth daily.      Marland Kitchen nystatin (MYCOSTATIN/NYSTOP) powder Apply topically 3 (three) times daily. 60 g 2  . sertraline (ZOLOFT) 100 MG tablet Take 2 tabs po qday     No current facility-administered medications on file prior to visit.     ALLERGIES: Allergies  Allergen Reactions  . Dilantin [Phenytoin Sodium Extended] Rash  .  Penicillins Rash    FAMILY HISTORY: Family History  Problem Relation Age of Onset  . Hypertension Mother   . Hypertension Father   . COPD Father   . Sleep apnea Father   . Breast cancer Paternal Aunt        41's  . Brain cancer Paternal Aunt   . Ovarian cancer Maternal Aunt   . Colon cancer Neg Hx     SOCIAL HISTORY: Social History   Socioeconomic History  . Marital status: Divorced    Spouse name: Not on file  . Number of children: 3  . Years of education: Not on file  . Highest education level: Not on file  Occupational History  . Occupation: stay at home mother  Social Needs  . Financial resource  strain: Not on file  . Food insecurity:    Worry: Not on file    Inability: Not on file  . Transportation needs:    Medical: Not on file    Non-medical: Not on file  Tobacco Use  . Smoking status: Never Smoker  . Smokeless tobacco: Never Used  Substance and Sexual Activity  . Alcohol use: No  . Drug use: No  . Sexual activity: Never    Birth control/protection: None  Lifestyle  . Physical activity:    Days per week: Not on file    Minutes per session: Not on file  . Stress: Not on file  Relationships  . Social connections:    Talks on phone: Not on file    Gets together: Not on file    Attends religious service: Not on file    Active member of club or organization: Not on file    Attends meetings of clubs or organizations: Not on file    Relationship status: Not on file  . Intimate partner violence:    Fear of current or ex partner: Not on file    Emotionally abused: Not on file    Physically abused: Not on file    Forced sexual activity: Not on file  Other Topics Concern  . Not on file  Social History Narrative   Divorced   From [place], [place]    REVIEW OF SYSTEMS: Constitutional: No fevers, chills, or sweats, no generalized fatigue, change in appetite Eyes: No visual changes, double vision, eye pain Ear, nose and throat: No hearing loss, ear pain, nasal congestion, sore throat Cardiovascular: No chest pain, palpitations Respiratory:  No shortness of breath at rest or with exertion, wheezes GastrointestinaI: No nausea, vomiting, diarrhea, abdominal pain, fecal incontinence Genitourinary:  No dysuria, urinary retention or frequency Musculoskeletal:  No neck pain, back pain Integumentary: No rash, pruritus, skin lesions Neurological: as above Psychiatric: + depression, no insomnia, anxiety Endocrine: No palpitations, fatigue, diaphoresis, mood swings, change in appetite, change in weight, increased thirst Hematologic/Lymphatic:  No anemia, purpura,  petechiae. Allergic/Immunologic: no itchy/runny eyes, nasal congestion, recent allergic reactions, rashes  PHYSICAL EXAM: Vitals:   02/28/18 1510  BP: 128/70  Pulse: 69  SpO2: 97%   General: No acute distress, flat affect, again slow to respond but answers appropriately and follows 2-step commands Head:  s/p craniotomy, well-healed scar Neck: supple, no paraspinal tenderness, full range of motion Heart:  Regular rate and rhythm Lungs:  Clear to auscultation bilaterally Back: No paraspinal tenderness Skin/Extremities: No rash, no edema Neurological Exam: alert and oriented x 3. Cranial nerves: There is good facial symmetry but again note of facial hypomimia (similar to prior). The pupils are equal round  and reactive to light bilaterally. Extraocular muscles are intact with mild upgaze paresis. Visual fields full. Speech is fluent and clear but lacks spontaneity of speech (similar to prior).Hearing is intact to conversational tone. Tone: Tone is increased on the right UE with cogwheeling R>L. Sensation is intact to light touch throughout. Coordination: No incoordination on finger to nose testing but bradykinetic. Strength is 5/5 in both UE, at least 3/5 both LE. Gait: not tested, she is wheelchair bound with 2-person assist needed.  IMPRESSION: This is a pleasant 57 yo woman with a history of left frontal astrocytoma s/p resection x 2, chemotherapy and radiation, with frontal lobe syndrome with cognitive impairment, who has had 2 focal seizures since September 2017 with right-sided shaking and post-ictal Todd's paralysis. No seizures since February 2018 on Keppra 750mg  BID, no side effects. She continues to see Neuropsychiatry at [facility] for frontal lobe syndrome. Continue home PT exercises and plan for outpatient PT/OT after the holidays. She requires a wheelchair at home, requirements reviewed, she has mobility limitation significantly impairing ADLs, her brother helps her with all transfers.  Mobility cannot be resolved by walker/cane. Their home has adequate access for maneuvering space. The manual wheelchair will help her participate with her brother helping with ADLs and has been used on a regular basis at home. She has not expressed an unwillingness for use of wheelchair. She does have sufficient upper extremity function to self-propel, although her brother helps her a lot. She does not drive. Follow-up in 1 year, they know to call for any changes.   Thank you for allowing me to participate in her care.  Please do not hesitate to call for any questions or concerns.  The duration of this appointment visit was 26 minutes of face-to-face time with the patient.  Greater than 50% of this time was spent in counseling, explanation of diagnosis, planning of further management, and coordination of care.   Ellouise Newer, M.D.   CC: Dr. Damita Dunnings

## 2018-03-04 NOTE — Telephone Encounter (Signed)
Patient's brother is calling in making sure you got the paperwork for the wheelchair. They wanted to check the status of that. Please call him back at [phone]. Thank you!

## 2018-03-05 DIAGNOSIS — J9610 Chronic respiratory failure, unspecified whether with hypoxia or hypercapnia: Secondary | ICD-10-CM | POA: Diagnosis not present

## 2018-03-05 MED ORDER — AMBULATORY NON FORMULARY MEDICATION: 0 refills | Status: DC

## 2018-03-05 NOTE — Telephone Encounter (Signed)
Rx written.

## 2018-04-04 DIAGNOSIS — J9610 Chronic respiratory failure, unspecified whether with hypoxia or hypercapnia: Secondary | ICD-10-CM | POA: Diagnosis not present

## 2018-04-14 DIAGNOSIS — R69 Illness, unspecified: Secondary | ICD-10-CM | POA: Diagnosis not present

## 2018-04-14 DIAGNOSIS — F419 Anxiety disorder, unspecified: Secondary | ICD-10-CM | POA: Diagnosis not present

## 2018-04-14 DIAGNOSIS — Z8249 Family history of ischemic heart disease and other diseases of the circulatory system: Secondary | ICD-10-CM | POA: Diagnosis not present

## 2018-04-14 DIAGNOSIS — Z825 Family history of asthma and other chronic lower respiratory diseases: Secondary | ICD-10-CM | POA: Diagnosis not present

## 2018-04-14 DIAGNOSIS — R32 Unspecified urinary incontinence: Secondary | ICD-10-CM | POA: Diagnosis not present

## 2018-04-14 DIAGNOSIS — R569 Unspecified convulsions: Secondary | ICD-10-CM | POA: Diagnosis not present

## 2018-04-14 DIAGNOSIS — I69319 Unspecified symptoms and signs involving cognitive functions following cerebral infarction: Secondary | ICD-10-CM | POA: Diagnosis not present

## 2018-04-14 DIAGNOSIS — I69353 Hemiplegia and hemiparesis following cerebral infarction affecting right non-dominant side: Secondary | ICD-10-CM | POA: Diagnosis not present

## 2018-04-14 DIAGNOSIS — Z823 Family history of stroke: Secondary | ICD-10-CM | POA: Diagnosis not present

## 2018-04-14 DIAGNOSIS — Z7722 Contact with and (suspected) exposure to environmental tobacco smoke (acute) (chronic): Secondary | ICD-10-CM | POA: Diagnosis not present

## 2018-04-25 ENCOUNTER — Ambulatory Visit: Payer: Medicare HMO | Admitting: Internal Medicine

## 2018-04-29 DIAGNOSIS — C711 Malignant neoplasm of frontal lobe: Secondary | ICD-10-CM | POA: Diagnosis not present

## 2018-04-29 DIAGNOSIS — F339 Major depressive disorder, recurrent, unspecified: Secondary | ICD-10-CM | POA: Diagnosis not present

## 2018-04-29 DIAGNOSIS — F0789 Other personality and behavioral disorders due to known physiological condition: Secondary | ICD-10-CM | POA: Diagnosis not present

## 2018-04-29 DIAGNOSIS — R69 Illness, unspecified: Secondary | ICD-10-CM | POA: Diagnosis not present

## 2018-04-29 DIAGNOSIS — F09 Unspecified mental disorder due to known physiological condition: Secondary | ICD-10-CM | POA: Diagnosis not present

## 2018-05-01 ENCOUNTER — Encounter: Payer: Self-pay | Admitting: Internal Medicine

## 2018-05-01 ENCOUNTER — Ambulatory Visit: Payer: Medicare HMO | Admitting: Internal Medicine

## 2018-05-01 VITALS — BP 108/62 | HR 65

## 2018-05-01 DIAGNOSIS — G4733 Obstructive sleep apnea (adult) (pediatric): Secondary | ICD-10-CM | POA: Diagnosis not present

## 2018-05-01 DIAGNOSIS — G4734 Idiopathic sleep related nonobstructive alveolar hypoventilation: Secondary | ICD-10-CM

## 2018-05-01 NOTE — Patient Instructions (Signed)
Order- We can continue O2 2L for sleep  Order- flu vax standard  Please call if we can help

## 2018-05-01 NOTE — Progress Notes (Signed)
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  NPSG 02/26/16- AHI 3.7/hour (WNL) with mean oxygen saturation 93.4% and nadir 65%, moderate snoring, body weight 168 pounds. Office Spirometry 07/03/2016-unreliable, submaximal effort read as "moderate restriction". FVC 1.90/66%, FEV1 1.43/63%, ratio 0.75, FEF 25-75% 1.26/53%  -------------------------------------------------------------------------------------------------------------   09/19/16-58 year old female followed for sleep associated hypoxemia,, complicated by cognitive dysfunction treated with Ritalin.,  after resection of astrocytoma/XRT/chemotherapy 2 month follow for hypoxia. Was placed on O2 during the night. States breathing has been ok since on oxygen.  Overnight oximetry 07/05/16-qualified for sleep O2 Room air saturation today 95% She reports that with home oxygen at night she now sleeps through the night, much more restfully and without waking. Her brother says the noise of her breathing at night is much quieter and less labored as heard on the bedside monitor in his room. He also notices that she is no longer waking during the night to eat a snack, because she is sleeping through. She has begun to gain weight again and they both understand this might take her back to  obstructive sleep apnea as noted on her first sleep study. CXR 07/03/16 IMPRESSION: Negative aside from mild nonspecific increased pulmonary interstitial markings, such is due to a degree of chronic pulmonary interstitial disease.  05/01/2018- 58 year old female followed for sleep associated hypoxemia,, complicated by cognitive dysfunction treated with Ritalin.,  after resection of astrocytoma /XRT/ chemo.  -----OSA and Hypoxia: Pt uses O2 QHS only through [place]. Pt denies any  wheezing, cough, or congestion. Also denies any issues with sleep as well.  No CPAP- uses O2 only. Lost weight. Unable to stand for weight and height. Brother here to assist.  O2 L sleep/ [employer] Still sleeping through the night on O2, without wheeze.  ROS-see HPI    "+"  = positive Constitutional:    weight loss, night sweats, fevers, chills, fatigue, lassitude. HEENT:    headaches, difficulty swallowing, tooth/dental problems, sore throat,       sneezing, itching, ear ache, nasal congestion, post nasal drip, snoring CV:    chest pain, orthopnea, PND, swelling in lower extremities, anasarca,                                                      dizziness, palpitations Resp:   shortness of breath with exertion or at rest.                productive cough,   non-productive cough, coughing up of blood.              change in color of mucus.  wheezing.   Skin:    rash or lesions. GI:  No-   heartburn, indigestion, abdominal pain, nausea, vomiting, diarrhea,                 change in bowel habits, loss of appetite GU: dysuria, change in color of urine, no urgency or frequency.   flank pain. MS:   joint pain, stiffness, decreased range of motion, back pain. Neuro-     HPI/ per Neurolgy Psych:  HPI  OBJ- Physical Exam General- Alert, Oriented, Affect-flat, Distress- none acute. Wheelchair/ semi recumbent. + Obese Skin- rash-none, lesions-  none, excoriation- none Lymphadenopathy- none Head- atraumatic            Eyes- Gross vision intact, PERRLA, conjunctivae and secretions clear            Ears- Hearing, canals-normal            Nose- Clear, no-Septal dev, mucus, polyps, erosion, perforation             Throat- Mallampati II , mucosa clear , drainage- none, tonsils- atrophic Neck- flexible , trachea midline, no stridor , thyroid nl, carotid no bruit Chest - symmetrical excursion , unlabored           Heart/CV- RRR , no murmur , no gallop  , no rub, nl s1 s2                           - JVD+ 1  , edema +1, stasis changes- none, varices- none           Lung- +few crackles L base/ unlabored, wheeze- none, cough- none , dullness-none, rub- none           Chest wall-  Abd-  Br/ Gen/ Rectal- Not done, not indicated Extrem- cyanosis- none, clubbing, none, atrophy- none, strength- nl Neuro- +passive, responsive. + tremor R hand

## 2018-05-05 DIAGNOSIS — J9610 Chronic respiratory failure, unspecified whether with hypoxia or hypercapnia: Secondary | ICD-10-CM | POA: Diagnosis not present

## 2018-05-12 ENCOUNTER — Other Ambulatory Visit: Payer: Self-pay | Admitting: Family Medicine

## 2018-05-12 DIAGNOSIS — R739 Hyperglycemia, unspecified: Secondary | ICD-10-CM

## 2018-05-12 DIAGNOSIS — E785 Hyperlipidemia, unspecified: Secondary | ICD-10-CM

## 2018-05-13 ENCOUNTER — Ambulatory Visit: Payer: Medicare HMO

## 2018-05-16 ENCOUNTER — Ambulatory Visit: Payer: Medicare HMO

## 2018-05-16 ENCOUNTER — Other Ambulatory Visit (INDEPENDENT_AMBULATORY_CARE_PROVIDER_SITE_OTHER): Payer: Medicare HMO

## 2018-05-16 DIAGNOSIS — R739 Hyperglycemia, unspecified: Secondary | ICD-10-CM | POA: Diagnosis not present

## 2018-05-16 DIAGNOSIS — E785 Hyperlipidemia, unspecified: Secondary | ICD-10-CM

## 2018-05-16 LAB — COMPREHENSIVE METABOLIC PANEL
ALBUMIN: 3.9 g/dL (ref 3.5–5.2)
ALT: 22 U/L (ref 0–35)
AST: 24 U/L (ref 0–37)
Alkaline Phosphatase: 88 U/L (ref 39–117)
BILIRUBIN TOTAL: 0.5 mg/dL (ref 0.2–1.2)
BUN: 11 mg/dL (ref 6–23)
CO2: 32 mEq/L (ref 19–32)
CREATININE: 0.77 mg/dL (ref 0.40–1.20)
Calcium: 9.2 mg/dL (ref 8.4–10.5)
Chloride: 100 mEq/L (ref 96–112)
GFR: 77.69 mL/min (ref >60.00)
Glucose, Bld: 96 mg/dL (ref 70–99)
Potassium: 3.9 mEq/L (ref 3.5–5.1)
Sodium: 139 mEq/L (ref 135–145)
TOTAL PROTEIN: 6.9 g/dL (ref 6.0–8.3)

## 2018-05-16 LAB — CBC WITH DIFFERENTIAL/PLATELET
BASOS ABS: 0 10*3/uL (ref 0.0–0.1)
Basophils Relative: 0.5 % (ref 0.0–3.0)
EOS ABS: 0.1 10*3/uL (ref 0.0–0.7)
Eosinophils Relative: 2 % (ref 0.0–5.0)
HEMATOCRIT: 41.7 % (ref 36.0–46.0)
HEMOGLOBIN: 13.8 g/dL (ref 12.0–15.0)
LYMPHS PCT: 47.3 % — AB (ref 12.0–46.0)
Lymphs Abs: 3.5 10*3/uL (ref 0.7–4.0)
MCHC: 33.1 g/dL (ref 30.0–36.0)
MCV: 78.4 fl (ref 78.0–100.0)
MONO ABS: 0.4 10*3/uL (ref 0.1–1.0)
Monocytes Relative: 4.9 % (ref 3.0–12.0)
Neutro Abs: 3.4 10*3/uL (ref 1.4–7.7)
Neutrophils Relative %: 45.3 % (ref 43.0–77.0)
PLATELETS: 307 10*3/uL (ref 150.0–400.0)
RBC: 5.31 Mil/uL — ABNORMAL HIGH (ref 3.87–5.11)
RDW: 15.9 % — ABNORMAL HIGH (ref 11.5–15.5)
WBC: 7.4 10*3/uL (ref 4.0–10.5)

## 2018-05-16 LAB — LIPID PANEL
CHOL/HDL RATIO: 7
CHOLESTEROL: 242 mg/dL — AB (ref 0–200)
HDL: 33.4 mg/dL — ABNORMAL LOW (ref >39.00)
NonHDL: 208.78
TRIGLYCERIDES: 328 mg/dL — AB (ref 0.0–149.0)
VLDL: 65.6 mg/dL — ABNORMAL HIGH (ref 0.0–40.0)

## 2018-05-16 LAB — LDL CHOLESTEROL, DIRECT: LDL DIRECT: 72 mg/dL

## 2018-05-16 LAB — HEMOGLOBIN A1C: HEMOGLOBIN A1C: 5.2 % (ref 4.6–6.5)

## 2018-05-20 ENCOUNTER — Encounter: Payer: Medicare HMO | Admitting: Family Medicine

## 2018-05-30 ENCOUNTER — Encounter: Payer: Medicare HMO | Admitting: Family Medicine

## 2018-06-05 DIAGNOSIS — C711 Malignant neoplasm of frontal lobe: Secondary | ICD-10-CM | POA: Diagnosis not present

## 2018-06-05 DIAGNOSIS — F09 Unspecified mental disorder due to known physiological condition: Secondary | ICD-10-CM | POA: Diagnosis not present

## 2018-06-05 DIAGNOSIS — F339 Major depressive disorder, recurrent, unspecified: Secondary | ICD-10-CM | POA: Diagnosis not present

## 2018-06-05 DIAGNOSIS — J9610 Chronic respiratory failure, unspecified whether with hypoxia or hypercapnia: Secondary | ICD-10-CM | POA: Diagnosis not present

## 2018-06-05 DIAGNOSIS — R69 Illness, unspecified: Secondary | ICD-10-CM | POA: Diagnosis not present

## 2018-06-05 DIAGNOSIS — F0789 Other personality and behavioral disorders due to known physiological condition: Secondary | ICD-10-CM | POA: Diagnosis not present

## 2018-06-10 ENCOUNTER — Encounter: Payer: Self-pay | Admitting: Family Medicine

## 2018-06-10 ENCOUNTER — Ambulatory Visit (INDEPENDENT_AMBULATORY_CARE_PROVIDER_SITE_OTHER): Payer: Medicare HMO | Admitting: Family Medicine

## 2018-06-10 VITALS — BP 126/66 | HR 63 | Temp 97.7°F | Ht 59.0 in | Wt 180.0 lb

## 2018-06-10 DIAGNOSIS — R569 Unspecified convulsions: Secondary | ICD-10-CM | POA: Diagnosis not present

## 2018-06-10 DIAGNOSIS — Z7189 Other specified counseling: Secondary | ICD-10-CM

## 2018-06-10 DIAGNOSIS — G4734 Idiopathic sleep related nonobstructive alveolar hypoventilation: Secondary | ICD-10-CM | POA: Diagnosis not present

## 2018-06-10 DIAGNOSIS — R600 Localized edema: Secondary | ICD-10-CM

## 2018-06-10 DIAGNOSIS — F339 Major depressive disorder, recurrent, unspecified: Secondary | ICD-10-CM | POA: Diagnosis not present

## 2018-06-10 DIAGNOSIS — R69 Illness, unspecified: Secondary | ICD-10-CM | POA: Diagnosis not present

## 2018-06-10 DIAGNOSIS — Z85841 Personal history of malignant neoplasm of brain: Secondary | ICD-10-CM | POA: Diagnosis not present

## 2018-06-10 DIAGNOSIS — Z0000 Encounter for general adult medical examination without abnormal findings: Secondary | ICD-10-CM | POA: Diagnosis not present

## 2018-06-10 NOTE — Patient Instructions (Addendum)
You can call for a mammogram at [place]  Update me as needed.  Take care.  Glad to see you.

## 2018-06-10 NOTE — Progress Notes (Signed)
I have personally reviewed the Medicare Annual Wellness questionnaire and have noted 1. The patient's medical and social history 2. Their use of alcohol, tobacco or illicit drugs 3. Their current medications and supplements 4. The patient's functional ability including ADL's, fall risks, home safety risks and hearing or visual             impairment. 5. Diet and physical activities 6. Evidence for depression or mood disorders  The patients weight, height, BMI have been recorded in the chart and visual acuity is per eye clinic.  I have made referrals, counseling and provided education to the patient based review of the above and I have provided the pt with a written personalized care plan for preventive services.  Provider list updated- see scanned forms.  Routine anticipatory guidance given to patient.  See health maintenance. The possibility exists that previously documented standard health maintenance information may have been brought forward from a previous encounter into this note.  If needed, that same information has been updated to reflect the current situation based on today's encounter.    Flu 05/01/2018 Shingles out of stock.  PNA due at 67.  Discussed with patient. Tetanus 2014 Colonoscopy 2015 Breast cancer screening discussed with patient.  She can call about follow-up, family can help her get to the appointment. DXA deferred given her age.  Advance directive-brother [name] designated if patient were incapacitated Pap smear deferred for now.  She declined.  Discussed.  No previous abnormals. Cognitive function addressed- see scanned forms- and if abnormal then additional documentation follows.   Fall risk - d/w pt.  She can walk a few steps slowly, with supervision and help.  Can transfer but not ambulate far.  She has done PT intermittently prev but w/o sig benefit.   Fall cautions discussed.  Depression:changed to pristiq, followed by [facility] [facility].  I'll defer, d/w pt and  brother.  She and brother agree. Pristiq helped, she was more alert on med compared to sertraline.    Good long term memory but short term memory is still affected, some better with pristiq.  She has short attention span and is followed by neuropsych at [facility].  She is on amantadine for motivation and concentration per psych clinic.    Not needing hearing aids per patient report.   O2 at night, not in the day.  Sleeps better with use.    She is remained seizure-free in the interval.  Compliant with medication.  She has bilateral lower extremity edema that is worse when she has been sitting up for most of the day.  It resolves with elevation.  PMH and SH reviewed  Meds, vitals, and allergies reviewed.   ROS: Per HPI.  Unless specifically indicated otherwise in HPI, the patient denies:  General: fever. Eyes: acute vision changes ENT: sore throat Cardiovascular: chest pain Respiratory: SOB GI: vomiting GU: dysuria Musculoskeletal: acute back pain Derm: acute rash Neuro: acute motor dysfunction Psych: worsening mood Endocrine: polydipsia Heme: bleeding Allergy: hayfever  GEN: nad, alert and oriented except as described below, in wheelchair. HEENT: mucous membranes moist NECK: supple w/o LA CV: rrr. PULM: ctab, no inc wob ABD: soft, +bs EXT: !+ BLE edema SKIN: no acute rash She thought it was 2019 instead of 2020.  She did know the month but she was 1 day off on the day of the week.  2 out of 3 recall.

## 2018-06-11 DIAGNOSIS — R600 Localized edema: Secondary | ICD-10-CM | POA: Insufficient documentation

## 2018-06-11 DIAGNOSIS — Z0000 Encounter for general adult medical examination without abnormal findings: Secondary | ICD-10-CM | POA: Insufficient documentation

## 2018-06-11 NOTE — Assessment & Plan Note (Signed)
She was changed to pristiq, followed by [facility] clinic.  I'll defer, d/w pt and brother.  She and brother agree. Pristiq helped, she was more alert on med compared to sertraline.

## 2018-06-11 NOTE — Assessment & Plan Note (Signed)
O2 at night, not in the day.  Sleeps better with use.  Continue as is.

## 2018-06-11 NOTE — Assessment & Plan Note (Signed)
She has bilateral lower extremity edema that is worse when she has been sitting up for most of the day.  It resolves with elevation.  Continue elevation as needed.  Update me as needed.  Lungs are still clear.

## 2018-06-11 NOTE — Assessment & Plan Note (Signed)
Flu 05/01/2018 Shingles out of stock.  PNA due at 67.  Discussed with patient. Tetanus 2014 Colonoscopy 2015 Breast cancer screening discussed with patient.  She can call about follow-up, family can help her get to the appointment. DXA deferred given her age.  Advance directive-brother [name] designated if patient were incapacitated Pap smear deferred for now.  She declined.  Discussed.  No previous abnormals. Cognitive function addressed- see scanned forms- and if abnormal then additional documentation follows.

## 2018-06-11 NOTE — Assessment & Plan Note (Signed)
Compliant with medications.  Seizure-free in the interval.  Continue as is.

## 2018-06-11 NOTE — Assessment & Plan Note (Signed)
Advance directive-brother [name] designated if patient were incapacitated

## 2018-06-11 NOTE — Assessment & Plan Note (Signed)
With subsequent frontal lobe syndrome.  Good long term memory but short term memory is still affected, some better with pristiq.  She has short attention span and is followed by neuropsych at [facility].  She is on amantadine for motivation and concentration per psych clinic.    She has family support.

## 2018-07-04 DIAGNOSIS — J9610 Chronic respiratory failure, unspecified whether with hypoxia or hypercapnia: Secondary | ICD-10-CM | POA: Diagnosis not present

## 2018-07-07 NOTE — Assessment & Plan Note (Signed)
Continues to benefit from O2 2L for sleep lan- continue O2, flu vax

## 2018-07-07 NOTE — Assessment & Plan Note (Signed)
Resolved by weight loss as of 2017 study. Plan- advised to avoid weight gain, encourage sleep on side.

## 2018-07-22 DIAGNOSIS — R69 Illness, unspecified: Secondary | ICD-10-CM | POA: Diagnosis not present

## 2018-08-04 DIAGNOSIS — J9610 Chronic respiratory failure, unspecified whether with hypoxia or hypercapnia: Secondary | ICD-10-CM | POA: Diagnosis not present

## 2018-09-03 DIAGNOSIS — J9610 Chronic respiratory failure, unspecified whether with hypoxia or hypercapnia: Secondary | ICD-10-CM | POA: Diagnosis not present

## 2018-10-02 DIAGNOSIS — R69 Illness, unspecified: Secondary | ICD-10-CM | POA: Diagnosis not present

## 2018-10-04 DIAGNOSIS — J9610 Chronic respiratory failure, unspecified whether with hypoxia or hypercapnia: Secondary | ICD-10-CM | POA: Diagnosis not present

## 2018-11-03 DIAGNOSIS — J9610 Chronic respiratory failure, unspecified whether with hypoxia or hypercapnia: Secondary | ICD-10-CM | POA: Diagnosis not present

## 2018-11-12 ENCOUNTER — Telehealth: Payer: Self-pay | Admitting: Neurology

## 2018-11-12 DIAGNOSIS — G40209 Localization-related (focal) (partial) symptomatic epilepsy and epileptic syndromes with complex partial seizures, not intractable, without status epilepticus: Secondary | ICD-10-CM

## 2018-11-12 MED ORDER — LEVETIRACETAM 100 MG/ML PO SOLN: 750.0000 mg | Freq: Two times a day (BID) | ORAL | 5 refills | Status: DC

## 2018-11-12 NOTE — Telephone Encounter (Signed)
Received message that Keppra was too big for patient to swallow. [facility] [phone] and verified with [place] that they do have available in liquid form.   Current dose is Keppra 750mg  BID prescribed by Dr. Delice Lesch at last  office visit 02/28/18 and has annual follow up scheduled for 03/02/19.   Brother stated past two months it is gradually harder to get patient to swallow it. Has not missed any doses but it is taking up to 20 minutes to get her to take it in pill form. Need a refill today and they are requesting if the new refill can please be in elixir form.   Brother also wanted to know if there would be anything they would need to know about if this worked differently taking in liquid vs pill form. Thanks.

## 2018-11-12 NOTE — Telephone Encounter (Signed)
Ok to send in elixir form, same dose and instructions, just have brother make sure to shake the bottle well before administering. Thanks

## 2018-11-12 NOTE — Telephone Encounter (Signed)
Order placed for Keppra elixir same dose as previously ordered (750 mg BID).  [facility] [place]. Brother made aware script has been sent and also to make sure to shake the bottle well before administering.

## 2018-11-12 NOTE — Telephone Encounter (Signed)
Keppra too big for patient to swollow. He was wanting to know if there is another form she can take or something else that isn't so big. Please call him back and let him know or send something into pharm on file. She is unable to take her medication at this time. Thanks!

## 2018-12-04 DIAGNOSIS — J9610 Chronic respiratory failure, unspecified whether with hypoxia or hypercapnia: Secondary | ICD-10-CM | POA: Diagnosis not present

## 2018-12-18 ENCOUNTER — Ambulatory Visit: Payer: Medicare HMO | Admitting: Family Medicine

## 2018-12-22 ENCOUNTER — Encounter: Payer: Self-pay | Admitting: Family Medicine

## 2018-12-22 ENCOUNTER — Other Ambulatory Visit: Payer: Self-pay

## 2018-12-22 ENCOUNTER — Ambulatory Visit (INDEPENDENT_AMBULATORY_CARE_PROVIDER_SITE_OTHER): Payer: Medicare HMO | Admitting: Family Medicine

## 2018-12-22 VITALS — BP 96/72 | HR 63 | Temp 97.8°F | Ht 59.0 in

## 2018-12-22 DIAGNOSIS — Z85841 Personal history of malignant neoplasm of brain: Secondary | ICD-10-CM

## 2018-12-22 LAB — CBC WITH DIFFERENTIAL/PLATELET
Basophils Absolute: 0 10*3/uL (ref 0.0–0.1)
Basophils Relative: 0.7 % (ref 0.0–3.0)
Eosinophils Absolute: 0.1 10*3/uL (ref 0.0–0.7)
Eosinophils Relative: 1.8 % (ref 0.0–5.0)
HCT: 41.3 % (ref 36.0–46.0)
Hemoglobin: 13.9 g/dL (ref 12.0–15.0)
Lymphocytes Relative: 49.5 % — ABNORMAL HIGH (ref 12.0–46.0)
Lymphs Abs: 3 10*3/uL (ref 0.7–4.0)
MCHC: 33.6 g/dL (ref 30.0–36.0)
MCV: 79.9 fl (ref 78.0–100.0)
Monocytes Absolute: 0.3 10*3/uL (ref 0.1–1.0)
Monocytes Relative: 5.2 % (ref 3.0–12.0)
Neutro Abs: 2.6 10*3/uL (ref 1.4–7.7)
Neutrophils Relative %: 42.8 % — ABNORMAL LOW (ref 43.0–77.0)
Platelets: 269 10*3/uL (ref 150.0–400.0)
RBC: 5.17 Mil/uL — ABNORMAL HIGH (ref 3.87–5.11)
RDW: 14.1 % (ref 11.5–15.5)
WBC: 6.2 10*3/uL (ref 4.0–10.5)

## 2018-12-22 LAB — COMPREHENSIVE METABOLIC PANEL
ALT: 28 U/L (ref 0–35)
AST: 20 U/L (ref 0–37)
Albumin: 3.7 g/dL (ref 3.5–5.2)
Alkaline Phosphatase: 82 U/L (ref 39–117)
BUN: 11 mg/dL (ref 6–23)
CO2: 31 mEq/L (ref 19–32)
Calcium: 9.2 mg/dL (ref 8.4–10.5)
Chloride: 101 mEq/L (ref 96–112)
Creatinine, Ser: 0.66 mg/dL (ref 0.40–1.20)
GFR: 92.62 mL/min (ref >60.00)
Glucose, Bld: 141 mg/dL — ABNORMAL HIGH (ref 70–99)
Potassium: 3.8 mEq/L (ref 3.5–5.1)
Sodium: 140 mEq/L (ref 135–145)
Total Bilirubin: 0.5 mg/dL (ref 0.2–1.2)
Total Protein: 6.4 g/dL (ref 6.0–8.3)

## 2018-12-22 LAB — TSH: TSH: 3.11 u[IU]/mL (ref 0.35–4.50)

## 2018-12-22 NOTE — Progress Notes (Signed)
She isn't lightheaded.  Still living with her brother, he is usually working from home.  No new med changes.    For the last 6-7 weeks, she has been having "setbacks."  She has been whispering more with talking.  She prev had change to being L hand dominant but now has more trouble eating with L hand, trouble getting her L hand to her mouth.  Brother is having to help feed her and that is a change.  She has been leaning to the L when sitting.  Concern for fall out of a chair if the chair doesn't have arms.    Covid cautions d/w pt.  Patient has less social interaction in the meantime.    No FCNAVD.    PMH and SH reviewed  ROS: Per HPI unless specifically indicated in ROS section   Meds, vitals, and allergies reviewed.   GEN: nad, alert and oriented, in wheelchair, leaning slightly to the left. HEENT: ncat, no obvious facial droop noted. NECK: supple w/o LA CV: rrr. PULM: ctab, no inc wob ABD: soft, +bs EXT: no edema SKIN: no acute rash She has relatively flat but symmetric deep tendon reflexes in the extremities x4. Her grip is intact in the bilateral hands but she has decreased extension in the right hand at baseline.

## 2018-12-22 NOTE — Patient Instructions (Signed)
Go to the lab on the way out.  We'll contact you with your lab report. Let me update neurology about your situation and possible imaging and therapy.  Take care.  Glad to see you.

## 2018-12-22 NOTE — Progress Notes (Signed)
Pre visit review using our clinic review tool, if applicable. No additional management support is needed unless otherwise documented below in the visit note. 

## 2018-12-28 NOTE — Assessment & Plan Note (Signed)
Now with changes over the last 6 to 7 weeks.  She is whispering more with talking and she leans more to the left.  She has had more trouble with dexterity with the left hand.  This is different from her baseline.  Given her history is reasonable to recheck routine labs today and then see about imaging.  See orders.  I will update neurology.  Given the 6 to 7 weeks of symptoms and lack of emergent symptoms at this point she does not need emergency room evaluation. >25 minutes spent in face to face time with patient, >50% spent in counselling or coordination of care.

## 2019-01-04 DIAGNOSIS — J9610 Chronic respiratory failure, unspecified whether with hypoxia or hypercapnia: Secondary | ICD-10-CM | POA: Diagnosis not present

## 2019-01-06 ENCOUNTER — Other Ambulatory Visit: Payer: Self-pay

## 2019-01-06 ENCOUNTER — Emergency Department (HOSPITAL_COMMUNITY): Payer: Medicare HMO

## 2019-01-06 ENCOUNTER — Emergency Department (HOSPITAL_COMMUNITY)
Admission: EM | Admit: 2019-01-06 | Discharge: 2019-01-06 | Disposition: A | Discharge status: Home / Self Care | Payer: Medicare HMO | Attending: Emergency Medicine | Admitting: Emergency Medicine

## 2019-01-06 ENCOUNTER — Telehealth: Payer: Self-pay | Admitting: Neurology

## 2019-01-06 DIAGNOSIS — Z85841 Personal history of malignant neoplasm of brain: Secondary | ICD-10-CM | POA: Insufficient documentation

## 2019-01-06 DIAGNOSIS — G40209 Localization-related (focal) (partial) symptomatic epilepsy and epileptic syndromes with complex partial seizures, not intractable, without status epilepticus: Secondary | ICD-10-CM

## 2019-01-06 DIAGNOSIS — I959 Hypotension, unspecified: Secondary | ICD-10-CM | POA: Diagnosis not present

## 2019-01-06 DIAGNOSIS — R404 Transient alteration of awareness: Secondary | ICD-10-CM | POA: Diagnosis not present

## 2019-01-06 DIAGNOSIS — R569 Unspecified convulsions: Secondary | ICD-10-CM | POA: Diagnosis not present

## 2019-01-06 DIAGNOSIS — R4182 Altered mental status, unspecified: Secondary | ICD-10-CM | POA: Diagnosis not present

## 2019-01-06 DIAGNOSIS — R29898 Other symptoms and signs involving the musculoskeletal system: Secondary | ICD-10-CM

## 2019-01-06 DIAGNOSIS — R0689 Other abnormalities of breathing: Secondary | ICD-10-CM | POA: Diagnosis not present

## 2019-01-06 LAB — CBC
HCT: 40.9 % (ref 36.0–46.0)
Hemoglobin: 13.7 g/dL (ref 12.0–15.0)
MCH: 26.8 pg (ref 26.0–34.0)
MCHC: 33.5 g/dL (ref 30.0–36.0)
MCV: 80 fL (ref 80.0–100.0)
Platelets: 292 10*3/uL (ref 150–400)
RBC: 5.11 MIL/uL (ref 3.87–5.11)
RDW: 13.6 % (ref 11.5–15.5)
WBC: 8 10*3/uL (ref 4.0–10.5)
nRBC: 0 % (ref 0.0–0.2)

## 2019-01-06 LAB — COMPREHENSIVE METABOLIC PANEL
ALT: 26 U/L (ref 0–44)
AST: 26 U/L (ref 15–41)
Albumin: 3.2 g/dL — ABNORMAL LOW (ref 3.5–5.0)
Alkaline Phosphatase: 75 U/L (ref 38–126)
Anion gap: 7 (ref 5–15)
BUN: 10 mg/dL (ref 6–20)
CO2: 32 mmol/L (ref 22–32)
Calcium: 9 mg/dL (ref 8.9–10.3)
Chloride: 102 mmol/L (ref 98–111)
Creatinine, Ser: 0.8 mg/dL (ref 0.44–1.00)
GFR calc Af Amer: >60 mL/min (ref >60)
GFR calc non Af Amer: >60 mL/min (ref >60)
Glucose, Bld: 97 mg/dL (ref 70–99)
Potassium: 3.9 mmol/L (ref 3.5–5.1)
Sodium: 141 mmol/L (ref 135–145)
Total Bilirubin: 0.9 mg/dL (ref 0.3–1.2)
Total Protein: 6.4 g/dL — ABNORMAL LOW (ref 6.5–8.1)

## 2019-01-06 MED ORDER — SODIUM CHLORIDE 0.9% FLUSH: 3.0000 mL | Freq: Once | INTRAVENOUS | Status: DC

## 2019-01-06 MED ORDER — ARIPIPRAZOLE 5 MG PO TABS
5.0000 mg | ORAL_TABLET | Freq: Every day | ORAL | Status: DC
  Administered 2019-01-06: 5 mg via ORAL
  Filled 2019-01-06: qty 1

## 2019-01-06 MED ORDER — LEVETIRACETAM 100 MG/ML PO SOLN
1000.0000 mg | Freq: Two times a day (BID) | ORAL | Status: DC
  Administered 2019-01-06: 20:00:00 1000 mg via ORAL
  Filled 2019-01-06: qty 10

## 2019-01-06 MED ORDER — VENLAFAXINE HCL ER 75 MG PO CP24
75.0000 mg | ORAL_CAPSULE | Freq: Once | ORAL | Status: AC
  Administered 2019-01-06: 75 mg via ORAL
  Filled 2019-01-06: qty 1

## 2019-01-06 MED ORDER — AMANTADINE HCL 100 MG PO CAPS
100.0000 mg | ORAL_CAPSULE | Freq: Once | ORAL | Status: AC
  Administered 2019-01-06: 100 mg via ORAL
  Filled 2019-01-06: qty 1

## 2019-01-06 NOTE — ED Triage Notes (Signed)
Pt arrives from home where she lives with her brother following brain cancer- pt is nonambulatory and brother was concerned due to her not being as responsive as she normally is.  Pt on arrival to ER is alert and oriented to person place and time she states she feels like she might have had a seizure.

## 2019-01-06 NOTE — ED Provider Notes (Signed)
[facility] EMERGENCY DEPARTMENT Provider Note   CSN: [id number] Arrival date & time: 01/06/19  1115     History   Chief Complaint Chief Complaint  Patient presents with  . Altered Mental Status    HPI Audrey Turner is a 58 y.o. female.     HPI Patient presents with her brother provides much of the HPI. Patient has history of brain tumor, status post several surgeries, also seizures, currently on Keppra. They note that about 4 weeks ago the patient started having decline in overall status. She has baseline deficiencies, including new left-handed focus after losing some capacity to her right side following the tumor. She has had worsening generalized weakness, more difficulty with ambulation, which is typically done with assistance. Changes were mild, generalized until today.  Where the patient typically awakens easily, today she did not.  With essentially unresponsive behavior in the morning, brother called EMS. Patient has slowly returned back close to normal She currently denies any pain, new weakness, new discomfort, new numbness.  She scheduled for MRI in 3 days.  Past Medical History:  Diagnosis Date  . Acute sinusitis, unspecified   . Astrocytoma [facility]) 01/2006 /09/2006   grade 3 atrocytoma s/p chemo/radiation/surgery at [place], in remission since at least 2011  . Cancer ([place])    Brain   . Diarrhea   . Drug induced neutropenia(288.03)   . Primary exertional headache   . Recurrent depression ([place])   . Vaginitis     Patient Active Problem List   Diagnosis Date Noted  . Medicare annual wellness visit, subsequent 06/11/2018  . Lower extremity edema 06/11/2018  . HLD (hyperlipidemia) 05/13/2017  . Localization-related symptomatic epilepsy and epileptic syndromes with complex partial seizures, not intractable, without status epilepticus ([place]) 07/17/2016  . Hx of astrocytoma 07/17/2016  . Hypoxia, sleep related 07/03/2016  . Convulsions/seizures  ([place]) 12/14/2015  . Advance care planning 12/13/2015  . Muscular deconditioning 11/10/2015  . Hyperglycemia 03/12/2013  . Astrocytoma ([place]) 11/20/2011  . Obstructive sleep apnea 12/22/2009  . Depression, recurrent ([place]) 08/14/2007    Past Surgical History:  Procedure Laterality Date  . [facility].  . d&c miscarriage  1993  . left frontal craniotomy w/ left subtotal resection of left frontal brain tumor  01/10/2006  . [facility] brain tumor surgery  01/13/2006  . NSVD x 3 - premie at 22 wks    . resection residual tumor  10/08/2006   Dr. Tommi Rumps: [facility]  . skull surg  2009/2010/2012   3 times past brain surg to close up opening     OB History    Gravida  4   Para  3   Term  3   Preterm  0   AB  1   Living        SAB  1   TAB  0   Ectopic  0   Multiple      Live Births               Home Medications    Prior to Admission medications   Medication Sig Start Date End Date Taking? Authorizing Provider  Amantadine HCl 100 MG tablet Take 1 tab qam and 1 tab qnoon 03/05/17  Yes [provider]  desvenlafaxine (PRISTIQ) 50 MG 24 hr tablet Take 50 mg by mouth daily.   Yes [provider]  levETIRAcetam (KEPPRA) 100 MG/ML solution Take 7.5 mLs (750 mg total) by mouth 2 (  two) times daily. Shake well prior to administering. 11/12/18  Yes Cameron Sprang, MD  acetaminophen (TYLENOL) 500 MG tablet Take 500 mg by mouth as needed. 09/22/08   [provider]  ARIPiprazole (ABILIFY) 5 MG tablet Take 5 mg by mouth daily. 03/29/16 05/01/18  [provider]  benzocaine-resorcinol (VAGISIL) 5-2 % vaginal cream as needed as needed 10/19/09   [provider]  loratadine-pseudoephedrine (CLARITIN-D 24 HOUR) 10-240 MG per 24 hr tablet Take 10-240 mg by mouth as needed. 09/22/08   [provider]  Multiple Vitamin (MULTIVITAMIN) capsule Take 1 capsule by mouth daily.      [provider]  nystatin  (MYCOSTATIN/NYSTOP) powder Apply topically 3 (three) times daily. 05/10/17   Tonia Ghent, MD    Family History Family History  Problem Relation Age of Onset  . Hypertension Mother   . Hypertension Father   . COPD Father   . Sleep apnea Father   . Breast cancer Paternal Aunt        58's  . Brain cancer Paternal Aunt   . Ovarian cancer Maternal Aunt   . Colon cancer Neg Hx     Social History Social History   Tobacco Use  . Smoking status: Never Smoker  . Smokeless tobacco: Never Used  Substance Use Topics  . Alcohol use: No  . Drug use: No     Allergies   Dilantin [phenytoin sodium extended] and Penicillins   Review of Systems Review of Systems  Constitutional:       Per HPI, otherwise negative  HENT:       Per HPI, otherwise negative  Respiratory:       Per HPI, otherwise negative  Cardiovascular:       Per HPI, otherwise negative  Gastrointestinal: Negative for vomiting.  Endocrine:       Negative aside from HPI  Genitourinary:       Neg aside from HPI   Musculoskeletal:       Per HPI, otherwise negative  Skin: Negative.   Neurological: Positive for seizures, weakness and numbness. Negative for syncope.     Physical Exam Updated Vital Signs BP 124/84   Pulse 61   Temp 98.5 F (36.9 C) (Oral)   Resp (!) 22   Ht 4\' 11"  (1.499 m)   Wt 72.6 kg   SpO2 100%   BMI 32.32 kg/m   Physical Exam Vitals signs and nursing note reviewed.  Constitutional:      General: She is not in acute distress.    Appearance: She is well-developed.     Comments: Ill-appearing adult female listening to much of the conversation, interacting occasionally.  HENT:     Head: Normocephalic and atraumatic.     Comments: Multiple scars, notable alopecia. Eyes:     Conjunctiva/sclera: Conjunctivae normal.  Cardiovascular:     Rate and Rhythm: Normal rate and regular rhythm.  Pulmonary:     Effort: Pulmonary effort is normal. No respiratory distress.     Breath sounds:  Normal breath sounds. No stridor.  Abdominal:     General: There is no distension.  Skin:    General: Skin is warm and dry.  Neurological:     Mental Status: She is alert and oriented to person, place, and time.     Cranial Nerves: No cranial nerve deficit.     Comments: Grossly diminished tone, though the patient does move all extremities spontaneously, strength is 3+/5 throughout. Speech is brief, clear, appropriate.  No facial asymmetry.      ED Treatments / Results  Labs (all labs ordered are listed, but only abnormal results are displayed) Labs Reviewed  COMPREHENSIVE METABOLIC PANEL - Abnormal; Notable for the following components:      Result Value   Total Protein 6.4 (*)    Albumin 3.2 (*)    All other components within normal limits  CBC  CBG MONITORING, ED    EKG EKG Interpretation  Date/Time:  Tuesday January 06 2019 11:17:07 EDT Ventricular Rate:  66 PR Interval:  162 QRS Duration: 84 QT Interval:  424 QTC Calculation: 444 R Axis:   12 Text Interpretation:  Normal sinus rhythm Cannot rule out Anterior infarct , age undetermined Artifact Abnormal ECG Confirmed by Carmin Muskrat [phone]) on 01/06/2019 3:53:20 PM   Radiology Mr Brain Wo Contrast (neuro Protocol)  Result Date: 01/06/2019 CLINICAL DATA:  58 year old female with altered mental status. History of treated frontal astrocytoma. EXAM: MRI HEAD WITHOUT CONTRAST TECHNIQUE: Multiplanar, multiecho pulse sequences of the brain and surrounding structures were obtained without intravenous contrast. COMPARISON:  Brain MRI 01/26/2016. FINDINGS: Brain: Chronic left superior frontal gyrus resection cavity with underlying ex vacuo enlargement of the left lateral ventricle. Cavity size plus regional T2 and FLAIR hyperintensity is stable since October 2017. No interval gyral enlargement or mass effect. Regional hemosiderin is stable. No suspicious diffusion changes in the treatment area. No contrast was administered  today. Chronic susceptibility artifact over the right vertex. Stable cerebral volume. No restricted diffusion to suggest acute infarction. No midline shift, mass effect, or acute intracranial hemorrhage. Cervicomedullary junction and pituitary are within normal limits. Outside of the left frontal treatment area confluent cerebral white matter T2 and FLAIR hyperintensity is stable and might be XRT related. No new signal abnormality. Vascular: Major intracranial vascular flow voids are stable since 2017. Skull and upper cervical spine: Negative visible cervical spine. Superior cranioplasty. Visualized bone marrow signal is within normal limits. Sinuses/Orbits: Stable and negative. Other: Mastoids remain clear. Visible internal auditory structures appear normal. No acute scalp soft tissue findings. IMPRESSION: Stable non-contrast post treatment appearance of the brain since 2017. No adverse features, and no new or acute intracranial abnormality identified. Electronically Signed   By: Genevie Ann M.D.   On: 01/06/2019 19:32    Procedures Procedures (including critical care time)  Medications Ordered in ED Medications  sodium chloride flush (NS) 0.9 % injection 3 mL (has no administration in time range)  ARIPiprazole (ABILIFY) tablet 5 mg (5 mg Oral Given 01/06/19 1942)  levETIRAcetam (KEPPRA) 100 MG/ML solution 1,000 mg (1,000 mg Oral Given 01/06/19 1943)  venlafaxine XR (EFFEXOR-XR) 24 hr capsule 75 mg (75 mg Oral Given 01/06/19 1941)  amantadine (SYMMETREL) capsule 100 mg (100 mg Oral Given 01/06/19 1939)     Initial Impression / Assessment and Plan / ED Course  I have reviewed the triage vital signs and the nursing notes.  Pertinent labs & imaging results that were available during my care of the patient were reviewed by me and considered in my medical decision making (see chart for details).        8:22 PM On repeat exam the patient is awake and alert. Patient's brother now notes that she has  improved back to baseline, has continued to be interactive, verbal in a normal fashion.  On this report improvement following a period of listlessness suggests seizure. MRI results discussed at length, no evidence for return of cancer, nor stroke. With otherwise reassuring labs, there  is some suspicion for seizure, the patient will follow-up closely with her neurologist for consideration of medication adjustments. Absent other complaints, with hemodynamic stability, reassuring labs, MRI as above, patient was discharged in stable condition.  Final Clinical Impressions(s) / ED Diagnoses   Final diagnoses:  Transient alteration of awareness    ED Discharge Orders    None       Carmin Muskrat, MD 01/06/19 2023

## 2019-01-06 NOTE — Discharge Instructions (Addendum)
As discussed, your evaluation today has been largely reassuring.  But, it is important that you monitor your condition carefully, and do not hesitate to return to the ED if you develop new, or concerning changes in your condition.  Otherwise, please follow-up with your physician for appropriate ongoing care.  Your MRI results from tonight are below: IMPRESSION: Stable non-contrast post treatment appearance of the brain since 2017. No adverse features, and no new or acute intracranial abnormality identified.

## 2019-01-06 NOTE — ED Notes (Signed)
Brother [name] [phone]

## 2019-01-06 NOTE — Telephone Encounter (Signed)
Patient's brother called very concerned regarding his sister Audrey Turner. He found her Unresponsive this morning. She was taken to the ER where he said she is in the waiting area by herself and they will not let him see her or tell him anything. He said he is waiting outside the ER. He would like for some one to let him know what is going on. Please Call. Thanks

## 2019-01-06 NOTE — ED Notes (Signed)
Pt to MRI

## 2019-01-06 NOTE — Telephone Encounter (Signed)
Called and spoke to patient's brother [name] (on [place]). He went to wake her up this am and she was hard to arouse. She wears 2L [place] O2 - he said she was breathing just unresponsive. He called 911 and when they got there she was arouseable and able to answer questions appropriately. He said he can see her in the ER waiting room window and he was told they are waiting for an ER bed to open up. Brother is having to wait outside due to [place].  He was wanting to know what the plan was for her as he was worried if she had eaten and knows hasnt had her meds yet today. He wondered if Dr. Delice Lesch would be involved in her care while in the hospital - I informed him that the ER docs/hospitalists would be her providers while there. Told him I will make Dr. Delice Lesch aware she was taken to the ER.   Offered to call ER and ask if her nurse can call and update the brother. Spoke to [employer] in ER and she said they are just assigning patient to a room and she will call brother and make him aware now.

## 2019-01-06 NOTE — ED Notes (Signed)
MD in room with patient at this time.

## 2019-01-06 NOTE — ED Notes (Signed)
No changes in pt status.  Calm, alert and without sx noted.

## 2019-01-06 NOTE — ED Notes (Signed)
Patient verbalizes understanding of discharge instructions. Opportunity for questioning and answers were provided. Armband removed by staff, pt discharged from ED.  

## 2019-01-06 NOTE — ED Notes (Signed)
Pt wnl for her, states some weakness noted to R side.  States seizure today and brother called EMS.  Patient alert and oriented with slow, quiet speech.  States last seizure was 4 months ago.

## 2019-01-07 MED ORDER — LEVETIRACETAM 100 MG/ML PO SOLN: 1000.0000 mg | Freq: Two times a day (BID) | ORAL | 3 refills | Status: DC

## 2019-01-07 NOTE — Telephone Encounter (Addendum)
Spoke with brother and confirmed patient is taking the liquid keppra (she is) and brother states she consistently gets it and has not missed any doses. She is getting Keppra 750 mg BID.   Brother stated she is resting today and doing well other than being worn out from yesterday. He is willing to increase her to 1000mg  BID and will start that tonight.   [facility] [place].   Also made brother aware of Dr. Amparo Bristol note below regarding she most likely had a seizure and that Dr. Delice Lesch had reviewed MRI.  Next appt with Dr. Delice Lesch is 03/02/19.

## 2019-01-07 NOTE — Telephone Encounter (Signed)
Pls let brother know that I reviewed the ER notes and they feel she had a seizure because she was back to her usual self once in the ER. MRI brain no new changes. Has she missed any doses of the Keppra? If not, recommend increasing dose to 1000mg  BID if he is agreeable. Pls confirm if she is on the liquid keppra. Thanks

## 2019-01-07 NOTE — Addendum Note (Signed)
Addended by: Jesse Fall on: 01/07/2019 02:08 PM   Modules accepted: Orders

## 2019-01-07 NOTE — Telephone Encounter (Signed)
Thanks, pls send in updated Rx

## 2019-01-08 ENCOUNTER — Telehealth: Payer: Self-pay | Admitting: Family Medicine

## 2019-01-08 NOTE — Telephone Encounter (Signed)
Brother called  to ask me to cancel the MRI that patient is scheduled for on 01/12/19 . Patient went to the ED and they did the MRI while at the hospital. She apparently had a seizure and wouldn't wake up so he called 911. Dr Aquino's office called patients brother to followup and changed medicine. She is back to normal now and feeling better. She will see Dr Delice Lesch on 03/02/19 for followup. I have cancelled the MRI

## 2019-01-09 NOTE — Telephone Encounter (Signed)
Please check with her brother.  I saw the inpatient notes.  I saw that the imaging was done.  Please let me know if I can do anything in the meantime.  I hope she is feeling better.  Thanks.

## 2019-01-09 NOTE — Telephone Encounter (Signed)
Brother advised.

## 2019-01-12 ENCOUNTER — Ambulatory Visit: Payer: Medicare HMO

## 2019-01-16 DIAGNOSIS — R69 Illness, unspecified: Secondary | ICD-10-CM | POA: Diagnosis not present

## 2019-01-16 DIAGNOSIS — F330 Major depressive disorder, recurrent, mild: Secondary | ICD-10-CM | POA: Diagnosis not present

## 2019-01-16 DIAGNOSIS — F0789 Other personality and behavioral disorders due to known physiological condition: Secondary | ICD-10-CM | POA: Diagnosis not present

## 2019-02-03 DIAGNOSIS — J9610 Chronic respiratory failure, unspecified whether with hypoxia or hypercapnia: Secondary | ICD-10-CM | POA: Diagnosis not present

## 2019-03-02 ENCOUNTER — Ambulatory Visit: Payer: Medicare HMO | Admitting: Neurology

## 2019-03-06 DIAGNOSIS — J9610 Chronic respiratory failure, unspecified whether with hypoxia or hypercapnia: Secondary | ICD-10-CM | POA: Diagnosis not present

## 2019-03-24 ENCOUNTER — Other Ambulatory Visit: Payer: Self-pay | Admitting: Neurology

## 2019-03-24 DIAGNOSIS — Z85841 Personal history of malignant neoplasm of brain: Secondary | ICD-10-CM

## 2019-03-24 DIAGNOSIS — G40209 Localization-related (focal) (partial) symptomatic epilepsy and epileptic syndromes with complex partial seizures, not intractable, without status epilepticus: Secondary | ICD-10-CM

## 2019-03-24 DIAGNOSIS — R29898 Other symptoms and signs involving the musculoskeletal system: Secondary | ICD-10-CM

## 2019-03-27 DIAGNOSIS — F330 Major depressive disorder, recurrent, mild: Secondary | ICD-10-CM | POA: Diagnosis not present

## 2019-03-27 DIAGNOSIS — F0789 Other personality and behavioral disorders due to known physiological condition: Secondary | ICD-10-CM | POA: Diagnosis not present

## 2019-03-27 DIAGNOSIS — R69 Illness, unspecified: Secondary | ICD-10-CM | POA: Diagnosis not present

## 2019-04-05 DIAGNOSIS — J9610 Chronic respiratory failure, unspecified whether with hypoxia or hypercapnia: Secondary | ICD-10-CM | POA: Diagnosis not present

## 2019-05-04 ENCOUNTER — Other Ambulatory Visit: Payer: Self-pay

## 2019-05-04 ENCOUNTER — Ambulatory Visit (INDEPENDENT_AMBULATORY_CARE_PROVIDER_SITE_OTHER): Payer: Medicare HMO | Admitting: Internal Medicine

## 2019-05-04 ENCOUNTER — Encounter: Payer: Self-pay | Admitting: Internal Medicine

## 2019-05-04 DIAGNOSIS — G4734 Idiopathic sleep related nonobstructive alveolar hypoventilation: Secondary | ICD-10-CM | POA: Diagnosis not present

## 2019-05-04 DIAGNOSIS — R569 Unspecified convulsions: Secondary | ICD-10-CM | POA: Diagnosis not present

## 2019-05-04 NOTE — Patient Instructions (Signed)
I'm glad to hear that your breathing seems comfortable, and that wearing Oxygen at night does help sleep. We can continue Oxygen at 2L for sleep.  Please call if we can help.

## 2019-05-04 NOTE — Assessment & Plan Note (Signed)
Managed by Neurology on Keppra

## 2019-05-04 NOTE — Progress Notes (Signed)
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  NPSG 02/26/16- AHI 3.7/hour (WNL) with mean oxygen saturation 93.4% and nadir 65%, moderate snoring, body weight 168 pounds. Office Spirometry 07/03/2016-unreliable, submaximal effort read as "moderate restriction". FVC 1.90/66%, FEV1 1.43/63%, ratio 0.75, FEF 25-75% 1.26/53% Overnight oximetry 07/05/16-qualified for sleep O2 -------------------------------------------------------------------------------------------------------------    05/01/2018- 59 year old female followed for sleep associated hypoxemia,, complicated by cognitive dysfunction treated with Ritalin.,  after resection of astrocytoma /XRT/ chemo.  -----OSA and Hypoxia: Pt uses O2 QHS only through [place]. Pt denies any wheezing, cough, or congestion. Also denies any issues with sleep as well.  No CPAP- uses O2 only. Lost weight. Unable to stand for weight and height. Brother here to assist.  O2 L sleep/ [employer] Still sleeping through the night on O2, without wheeze.  05/04/19- Virtual Visit via Telephone Note  I connected with Woodfin Ganja on 05/04/19 at 11:00 AM EST by telephone and verified that I am speaking with the correct person using two identifiers.  Location: Patient: H Provider: O   I discussed the limitations, risks, security and privacy concerns of performing an evaluation and management service by telephone and the availability of in person appointments. I also discussed with the patient that there may be a patient responsible charge related to this service. The patient expressed understanding and agreed to proceed.   History of Present Illness: 59 year old female followed for sleep associated Hypoxemia,, complicated by cognitive dysfunction treated with Ritalin.,  after  resection of Astrocytoma /XRT/ chemo, Seizure, Hyperlipiemia,  Brother is caregiver and contact for this visit. She was on the line briefly for "yes and no" answers to simple questions, saying she is comfortable with breathing and oxygen helps sleep at night. Brother concurred from his observation. Seizure in October, on Keppra, followed by Neurology.    Observations/Objective:   Assessment and Plan: Nocturnal Hypoxemia- benefits from O2 2L sleep Seizure Disorder after resection brain tumor- no recognized aspiration.   Follow Up Instructions: 1 year   I discussed the assessment and treatment plan with the patient. The patient was provided an opportunity to ask questions and all were answered. The patient agreed with the plan and demonstrated an understanding of the instructions.   The patient was advised to call back or seek an in-person evaluation if the symptoms worsen or if the condition fails to improve as anticipated.  I provided 17 minutes of non-face-to-face time during this encounter.   Baird Lyons, MD    ROS-see HPI    "+"  = positive Constitutional:    weight loss, night sweats, fevers, chills, fatigue, lassitude. HEENT:    headaches, difficulty swallowing, tooth/dental problems, sore throat,       sneezing, itching, ear ache, nasal congestion, post nasal drip, snoring CV:    chest pain, orthopnea, PND, swelling in lower extremities, anasarca,                                                      dizziness, palpitations Resp:   shortness of breath with exertion or at rest.                productive cough,   non-productive cough, coughing up  of blood.              change in color of mucus.  wheezing.   Skin:    rash or lesions. GI:  No-   heartburn, indigestion, abdominal pain, nausea, vomiting, diarrhea,                 change in bowel habits, loss of appetite GU: dysuria, change in color of urine, no urgency or frequency.   flank pain. MS:   joint pain, stiffness,  decreased range of motion, back pain. Neuro-     HPI/ per Neurolgy Psych:  HPI  OBJ- Physical Exam General- Alert, Oriented, Affect-flat, Distress- none acute. Wheelchair/ semi recumbent. + Obese Skin- rash-none, lesions- none, excoriation- none Lymphadenopathy- none Head- atraumatic            Eyes- Gross vision intact, PERRLA, conjunctivae and secretions clear            Ears- Hearing, canals-normal            Nose- Clear, no-Septal dev, mucus, polyps, erosion, perforation             Throat- Mallampati II , mucosa clear , drainage- none, tonsils- atrophic Neck- flexible , trachea midline, no stridor , thyroid nl, carotid no bruit Chest - symmetrical excursion , unlabored           Heart/CV- RRR , no murmur , no gallop  , no rub, nl s1 s2                           - JVD+ 1 , edema +1, stasis changes- none, varices- none           Lung- +few crackles L base/ unlabored, wheeze- none, cough- none , dullness-none, rub- none           Chest wall-  Abd-  Br/ Gen/ Rectal- Not done, not indicated Extrem- cyanosis- none, clubbing, none, atrophy- none, strength- nl Neuro- +passive, responsive. + tremor R hand

## 2019-05-04 NOTE — Assessment & Plan Note (Signed)
Continues to benefit from O2 2L during sleep Plan- no change

## 2019-05-06 DIAGNOSIS — J9610 Chronic respiratory failure, unspecified whether with hypoxia or hypercapnia: Secondary | ICD-10-CM | POA: Diagnosis not present

## 2019-05-11 ENCOUNTER — Other Ambulatory Visit: Payer: Self-pay

## 2019-05-11 ENCOUNTER — Telehealth: Payer: Medicare HMO | Admitting: Neurology

## 2019-05-17 DIAGNOSIS — K5900 Constipation, unspecified: Secondary | ICD-10-CM | POA: Diagnosis not present

## 2019-05-17 DIAGNOSIS — I69351 Hemiplegia and hemiparesis following cerebral infarction affecting right dominant side: Secondary | ICD-10-CM | POA: Diagnosis not present

## 2019-05-17 DIAGNOSIS — R69 Illness, unspecified: Secondary | ICD-10-CM | POA: Diagnosis not present

## 2019-05-17 DIAGNOSIS — Z008 Encounter for other general examination: Secondary | ICD-10-CM | POA: Diagnosis not present

## 2019-05-17 DIAGNOSIS — J9610 Chronic respiratory failure, unspecified whether with hypoxia or hypercapnia: Secondary | ICD-10-CM | POA: Diagnosis not present

## 2019-05-17 DIAGNOSIS — I69319 Unspecified symptoms and signs involving cognitive functions following cerebral infarction: Secondary | ICD-10-CM | POA: Diagnosis not present

## 2019-05-17 DIAGNOSIS — R569 Unspecified convulsions: Secondary | ICD-10-CM | POA: Diagnosis not present

## 2019-05-17 DIAGNOSIS — E669 Obesity, unspecified: Secondary | ICD-10-CM | POA: Diagnosis not present

## 2019-05-17 DIAGNOSIS — I739 Peripheral vascular disease, unspecified: Secondary | ICD-10-CM | POA: Diagnosis not present

## 2019-06-02 DIAGNOSIS — R69 Illness, unspecified: Secondary | ICD-10-CM | POA: Diagnosis not present

## 2019-06-06 DIAGNOSIS — J9610 Chronic respiratory failure, unspecified whether with hypoxia or hypercapnia: Secondary | ICD-10-CM | POA: Diagnosis not present

## 2019-06-12 DIAGNOSIS — R32 Unspecified urinary incontinence: Secondary | ICD-10-CM | POA: Insufficient documentation

## 2019-06-12 DIAGNOSIS — S0100XA Unspecified open wound of scalp, initial encounter: Secondary | ICD-10-CM | POA: Insufficient documentation

## 2019-06-16 ENCOUNTER — Encounter: Payer: Self-pay | Admitting: Neurology

## 2019-06-16 ENCOUNTER — Telehealth (INDEPENDENT_AMBULATORY_CARE_PROVIDER_SITE_OTHER): Payer: Medicare HMO | Admitting: Neurology

## 2019-06-16 ENCOUNTER — Other Ambulatory Visit: Payer: Self-pay

## 2019-06-16 DIAGNOSIS — R29898 Other symptoms and signs involving the musculoskeletal system: Secondary | ICD-10-CM

## 2019-06-16 DIAGNOSIS — G40909 Epilepsy, unspecified, not intractable, without status epilepticus: Secondary | ICD-10-CM

## 2019-06-16 DIAGNOSIS — Z85841 Personal history of malignant neoplasm of brain: Secondary | ICD-10-CM | POA: Diagnosis not present

## 2019-06-16 DIAGNOSIS — G40209 Localization-related (focal) (partial) symptomatic epilepsy and epileptic syndromes with complex partial seizures, not intractable, without status epilepticus: Secondary | ICD-10-CM

## 2019-06-16 MED ORDER — LEVETIRACETAM 100 MG/ML PO SOLN: 1000.0000 mg | Freq: Two times a day (BID) | ORAL | 3 refills | Status: DC

## 2019-06-16 NOTE — Progress Notes (Signed)
Virtual Visit via Video Note The purpose of this virtual visit is to provide medical care while limiting exposure to the novel coronavirus.    Consent was obtained for video visit:  Yes.   Answered questions that patient had about telehealth interaction:  Yes.   I discussed the limitations, risks, security and privacy concerns of performing an evaluation and management service by telemedicine. I also discussed with the patient that there may be a patient responsible charge related to this service. The patient expressed understanding and agreed to proceed.  Pt location: Home Physician Location: office Name of referring provider:  Tonia Ghent, MD I connected with Audrey Turner at patients initiation/request on 06/16/2019 at  4:00 PM EST by video enabled telemedicine application and verified that I am speaking with the correct person using two identifiers. Pt MRN:  [id number] Pt DOB:  [birth year] Video Participants:  Audrey Turner;  Audrey Turner (brother)   History of Present Illness:  The patient had a virtual video visit on 06/16/19. She is 59 years old. 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 Her brother is present during the visit to provide information. Records and images were personally reviewed. Since her last visit, she was in the ER in 12/2018 for unresponsiveness. She had been having a decline in overall status, with worsening generalized weakness. She had an MRI brain without contrast which I personally reviewed, no acute changes, stable non-contrast post-treatment appearance since 2017. It was felt that she had a seizure. Levetiracetam dose increased to 1000mg  BID. Her brother reports that she has not fully recovered in terms of mobility since then. She has not recovered use in her left hand, with trouble maneuvering. She can open and close her hand, but has trouble brushing her teeth. Her brother has also  noted her left shoulder is drooping a little and wondered about peripheral artery disease. She has minimal verbal output but denies any headaches, dizziness, neck pain, focal numbness/tingling. Her brother had a visit from a physician from her insurance 3 weeks ago who recommended OT.   HPI reviewed from Dr. Doristine Devoid note and per brother: This is a 24 year old right-handed woman with a history of left frontal astrocytoma s/p multiple resections. Her first resection was in October, 2007, followed by a repeat in August, 2008. She has been followed by [facility] for this. She is also being seen at [facility] by psychotherapy for frontal lobe syndrome/cognitive dysfunction. Her brother reports she did well for 5 years post-surgery, then for since 2014, she has been declining, to the point that he had to move in with her in July 2017. She was in in her usual state of health on 12/09/2015 when she and her brother went to the mattress store (had done PT before that). Upon exiting her vehicle her brother noted she suddenly stopped and could not move, then started having right arm jerking. Her brother states that her left arm shook some but not like the right. She was helped to the ground. Her eyes rolled back and lasted about 3 minutes. EMS was contacted and once shaking stopped and waking up, she had trouble moving the right arm. By the time she got to the hospital, the R arm was stronger and back to baseline. . Patient has no prior history of seizure. She was started on Keppra 500 mg twice a day. After her first seizure, she did not follow-up with PT, her brother reported  she does not follow PT advice. She has become mostly wheelchair-bound. She is alone during the day but brother lives with her at night. Brother states that she does nothing but watch TV during the day. Seems to get confused/dazed by 8pm. Has EDS. Hx of OSA but refused CPAP years ago, she was Dr. Annamaria Boots who noted nocturnal hypoxemia. Brother also  reported day/night reversal.  Treatment History via review of [facility] records:  01/10/06: Craniotomy and debulking performed at outside hospital revealing anaplastic astrocytoma. This was followed by Temozolomide and radiation and then six months of Temozolomide with last cycle of Temozolomide initiated on 08/10/06.  09/30/06: Radiographic evidence of new rim enhancing lesion butting off of the original resection cavity consistent with progressive disease.  10/15/06: New patient evaluation at [facility] [place]. 10/17/06: Craniotomy with resection of tumor with Dr. Derrill Memo, and pathology confirming anaplastic astrocytoma.  11/19/06: Patient initiated on Zactima, Imatinib and Hydroxyurea per protocol.  01/20/07: Patient has completed two cycles on protocol utilizing Zactima, Gleevec, and Hydrea.  02/24/07: Patient is now status post three cycles of Zactima/Gleevec/Hydrea on protocol. She has had a dose reduction for cycle 3 with Zactima at 100 mg p.o. DAILY and Gleevec 300 mg p.o. DAILY due to problems with diarrhea.  03/24/07: Patient is status post four cycles of therapy on protocol with Zactima, Gleevec and Hydrea.  05/19/07: Patient is status post five cycles of Zactima, Gleevec, and Hydroxyurea on protocol; last dose on 04/18/07 and has been off therapy since that time secondary to requiring surgery on her craniotomy site. We will await recommendations from Dr. Beverely Low in Plastic Surgery prior to continuing with therapy. She is clinically and radiographically stable at this time.  09/22/07: Patient is status post four cycles of VP-16 x 21 days per 28-day cycle.  05/26/08: Status post twelve cycles of Etoposide; off therapy.   Diagnostic Data: 24-hour EEG which captured 2 episodes of being dazed, no EEG changes seen. EEG showed focal left hemisphere slowing, no epileptiform discharges. 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 There was left frontal lobe gliosis surrounding the resection cavity, additional patchy white matter changes. There were subcentimeter T2 bright cysts in the left corona radiata and right parietal lobe, favoring post-treatment changes.    Outpatient Encounter Medications as of 06/16/2019  Medication Sig  . Amantadine HCl 100 MG tablet Take 1 tab qam and 1 tab qnoon  . ARIPiprazole (ABILIFY) 5 MG tablet Take 5 mg by mouth daily.  Marland Kitchen desvenlafaxine (PRISTIQ) 50 MG 24 hr tablet Take 50 mg by mouth daily.  Marland Kitchen levETIRAcetam (KEPPRA) 100 MG/ML solution Take 10 mLs (1,000 mg total) by mouth 2 (two) times daily. Shake well prior to administering.  . Multiple Vitamin (MULTIVITAMIN) capsule Take 1 capsule by mouth daily.         No facility-administered encounter medications on file as of 06/16/2019.      Observations/Objective:   GEN:  The patient appears stated age and is in NAD.  Neurological examination: Patient is awake, alert, oriented to person, place. No aphasia, reduced fluency. Able to follow simple commands. Remote and recent memory impaired. Cranial nerves: Extraocular movements intact with no nystagmus. No facial asymmetry. Motor: Right hemiparesis with right hand spastic contracture, able to extend fingers. She  has difficulty with finger taps on the left hand, unable to do on right hand. She has difficulty lifting left arm above shoulder level. Gait not tested   Assessment and Plan:   This is a pleasant 59 yo woman with a history of left frontal astrocytoma s/p resection x 2, chemotherapy and radiation, with frontal lobe syndrome with cognitive impairment, who has had 2 focal seizures since September 2017 with right-sided shaking and post-ictal Todd's paralysis. She had a transient episode of unresponsiveness  last 12/2018 felt due to seizure. MRI brain no acute changes, stable from 2017. Levetiracetam dose increased to 1000mg  BID, no further similar episodes however she has not regained prior mobility and brother notes left upper extremity difficulties, she is unable to lift arm above shoulder level with difficulty maneuvering. Discussed doing an xray of the shoulder, her brother would like to discuss this with PCP, and if negative, would image cervical spine. Agree with OT. Follow-up in 6 months, they know to call for any changes.    Follow Up Instructions:   -I discussed the assessment and treatment plan with the patient/brother. The patient/brother were provided an opportunity to ask questions and all were answered. The patient/brother agreed with the plan and demonstrated an understanding of the instructions.   The patient/brother were advised to call back or seek an in-person evaluation if the symptoms worsen or if the condition fails to improve as anticipated.    Cameron Sprang, MD

## 2019-07-04 DIAGNOSIS — J9610 Chronic respiratory failure, unspecified whether with hypoxia or hypercapnia: Secondary | ICD-10-CM | POA: Diagnosis not present

## 2019-07-15 ENCOUNTER — Other Ambulatory Visit: Payer: Self-pay | Admitting: Family Medicine

## 2019-07-15 DIAGNOSIS — Z1231 Encounter for screening mammogram for malignant neoplasm of breast: Secondary | ICD-10-CM

## 2019-07-16 ENCOUNTER — Telehealth: Payer: Self-pay | Admitting: Family Medicine

## 2019-07-16 NOTE — Telephone Encounter (Signed)
Left message with pt brother, [name] (caregiver). He will call back to schedule Medicare Annual Wellness Visit (AWV) either virtually or audio only. Will also need to schedule labs and CPE with PCP. Thanks  Last AWV 06/10/18

## 2019-08-04 DIAGNOSIS — J9610 Chronic respiratory failure, unspecified whether with hypoxia or hypercapnia: Secondary | ICD-10-CM | POA: Diagnosis not present

## 2019-08-06 ENCOUNTER — Other Ambulatory Visit: Payer: Self-pay | Admitting: Family Medicine

## 2019-08-06 DIAGNOSIS — E785 Hyperlipidemia, unspecified: Secondary | ICD-10-CM

## 2019-08-06 DIAGNOSIS — R739 Hyperglycemia, unspecified: Secondary | ICD-10-CM

## 2019-08-07 ENCOUNTER — Other Ambulatory Visit: Payer: Self-pay

## 2019-08-07 ENCOUNTER — Other Ambulatory Visit (INDEPENDENT_AMBULATORY_CARE_PROVIDER_SITE_OTHER): Payer: Medicare HMO

## 2019-08-07 ENCOUNTER — Telehealth: Payer: Self-pay

## 2019-08-07 ENCOUNTER — Ambulatory Visit: Payer: Medicare HMO

## 2019-08-07 DIAGNOSIS — R739 Hyperglycemia, unspecified: Secondary | ICD-10-CM | POA: Diagnosis not present

## 2019-08-07 DIAGNOSIS — E785 Hyperlipidemia, unspecified: Secondary | ICD-10-CM

## 2019-08-07 LAB — CBC WITH DIFFERENTIAL/PLATELET
Basophils Absolute: 0 10*3/uL (ref 0.0–0.1)
Basophils Relative: 0.5 % (ref 0.0–3.0)
Eosinophils Absolute: 0.1 10*3/uL (ref 0.0–0.7)
Eosinophils Relative: 1.5 % (ref 0.0–5.0)
HCT: 41.3 % (ref 36.0–46.0)
Hemoglobin: 13.6 g/dL (ref 12.0–15.0)
Lymphocytes Relative: 47.6 % — ABNORMAL HIGH (ref 12.0–46.0)
Lymphs Abs: 2.8 10*3/uL (ref 0.7–4.0)
MCHC: 33 g/dL (ref 30.0–36.0)
MCV: 81.5 fl (ref 78.0–100.0)
Monocytes Absolute: 0.3 10*3/uL (ref 0.1–1.0)
Monocytes Relative: 5.1 % (ref 3.0–12.0)
Neutro Abs: 2.6 10*3/uL (ref 1.4–7.7)
Neutrophils Relative %: 45.3 % (ref 43.0–77.0)
Platelets: 268 10*3/uL (ref 150.0–400.0)
RBC: 5.07 Mil/uL (ref 3.87–5.11)
RDW: 14.1 % (ref 11.5–15.5)
WBC: 5.8 10*3/uL (ref 4.0–10.5)

## 2019-08-07 LAB — COMPREHENSIVE METABOLIC PANEL
ALT: 17 U/L (ref 0–35)
AST: 16 U/L (ref 0–37)
Albumin: 3.6 g/dL (ref 3.5–5.2)
Alkaline Phosphatase: 81 U/L (ref 39–117)
BUN: 12 mg/dL (ref 6–23)
CO2: 34 mEq/L — ABNORMAL HIGH (ref 19–32)
Calcium: 9 mg/dL (ref 8.4–10.5)
Chloride: 102 mEq/L (ref 96–112)
Creatinine, Ser: 0.7 mg/dL (ref 0.40–1.20)
GFR: 86.34 mL/min (ref >60.00)
Glucose, Bld: 103 mg/dL — ABNORMAL HIGH (ref 70–99)
Potassium: 3.9 mEq/L (ref 3.5–5.1)
Sodium: 142 mEq/L (ref 135–145)
Total Bilirubin: 0.4 mg/dL (ref 0.2–1.2)
Total Protein: 6.6 g/dL (ref 6.0–8.3)

## 2019-08-07 LAB — LIPID PANEL
Cholesterol: 165 mg/dL (ref 0–200)
HDL: 36.1 mg/dL — ABNORMAL LOW (ref >39.00)
LDL Cholesterol: 97 mg/dL (ref 0–99)
NonHDL: 128.61
Total CHOL/HDL Ratio: 5
Triglycerides: 160 mg/dL — ABNORMAL HIGH (ref 0.0–149.0)
VLDL: 32 mg/dL (ref 0.0–40.0)

## 2019-08-07 LAB — HEMOGLOBIN A1C: Hgb A1c MFr Bld: 4.9 % (ref 4.6–6.5)

## 2019-08-07 NOTE — Telephone Encounter (Signed)
FYI- Called patient to complete her Medicare visit and her brother [name]) stated that he was not aware (or either doesn't remember) this visit being scheduled. Patient was not really prepared at the time to complete it. Stated it would be easier for them to complete this with the doctor when she comes in for her physical on 08/14/2019. Appointment was cancelled.

## 2019-08-08 NOTE — Telephone Encounter (Signed)
Noted. Thanks.

## 2019-08-14 ENCOUNTER — Encounter: Payer: Medicare HMO | Admitting: Family Medicine

## 2019-08-18 ENCOUNTER — Ambulatory Visit (INDEPENDENT_AMBULATORY_CARE_PROVIDER_SITE_OTHER): Payer: Medicare HMO

## 2019-08-18 DIAGNOSIS — Z0000 Encounter for general adult medical examination without abnormal findings: Secondary | ICD-10-CM

## 2019-08-18 NOTE — Patient Instructions (Signed)
[name] , Thank you for taking time to come for your Medicare Wellness Visit. I appreciate your ongoing commitment to your health goals. Please review the following plan we discussed and let me know if I can assist you in the future.   Screening recommendations/referrals: Colonoscopy: Up to date, completed 06/02/2013 Mammogram: scheduled for 08/28/2019 Bone Density: at age 59 Recommended yearly ophthalmology/optometry visit for glaucoma screening and checkup Recommended yearly dental visit for hygiene and checkup  Vaccinations: Influenza vaccine: Fall 2021 Pneumococcal vaccine: at age 70 Tdap vaccine: Up to date, completed 03/12/2013 Shingles vaccine: discussed    Advanced directives: Please bring a copy of your POA (Power of [place]) and/or Living Will to your next appointment.   Conditions/risks identified: hyperlipidemia  Next appointment: 08/31/2019 @ 2:15 pm   Preventive Care 40-64 Years, Female Preventive care refers to lifestyle choices and visits with your health care provider that can promote health and wellness. What does preventive care include?  A yearly physical exam. This is also called an annual well check.  Dental exams once or twice a year.  Routine eye exams. Ask your health care provider how often you should have your eyes checked.  Personal lifestyle choices, including:  Daily care of your teeth and gums.  Regular physical activity.  Eating a healthy diet.  Avoiding tobacco and drug use.  Limiting alcohol use.  Practicing safe sex.  Taking low-dose aspirin daily starting at age 11.  Taking vitamin and mineral supplements as recommended by your health care provider. What happens during an annual well check? The services and screenings done by your health care provider during your annual well check will depend on your age, overall health, lifestyle risk factors, and family history of disease. Counseling  Your health care provider may ask you  questions about your:  Alcohol use.  Tobacco use.  Drug use.  Emotional well-being.  Home and relationship well-being.  Sexual activity.  Eating habits.  Work and work Statistician.  Method of birth control.  Menstrual cycle.  Pregnancy history. Screening  You may have the following tests or measurements:  Height, weight, and BMI.  Blood pressure.  Lipid and cholesterol levels. These may be checked every 5 years, or more frequently if you are over 55 years old.  Skin check.  Lung cancer screening. You may have this screening every year starting at age 62 if you have a 30-pack-year history of smoking and currently smoke or have quit within the past 15 years.  Fecal occult blood test (FOBT) of the stool. You may have this test every year starting at age 110.  Flexible sigmoidoscopy or colonoscopy. You may have a sigmoidoscopy every 5 years or a colonoscopy every 10 years starting at age 72.  Hepatitis C blood test.  Hepatitis B blood test.  Sexually transmitted disease (STD) testing.  Diabetes screening. This is done by checking your blood sugar (glucose) after you have not eaten for a while (fasting). You may have this done every 1-3 years.  Mammogram. This may be done every 1-2 years. Talk to your health care provider about when you should start having regular mammograms. This may depend on whether you have a family history of breast cancer.  BRCA-related cancer screening. This may be done if you have a family history of breast, ovarian, tubal, or peritoneal cancers.  Pelvic exam and Pap test. This may be done every 3 years starting at age 34. Starting at age 55, this may be done every 5 years  if you have a Pap test in combination with an HPV test.  Bone density scan. This is done to screen for osteoporosis. You may have this scan if you are at high risk for osteoporosis. Discuss your test results, treatment options, and if necessary, the need for more tests with  your health care provider. Vaccines  Your health care provider may recommend certain vaccines, such as:  Influenza vaccine. This is recommended every year.  Tetanus, diphtheria, and acellular pertussis (Tdap, Td) vaccine. You may need a Td booster every 10 years.  Zoster vaccine. You may need this after age 39.  Pneumococcal 13-valent conjugate (PCV13) vaccine. You may need this if you have certain conditions and were not previously vaccinated.  Pneumococcal polysaccharide (PPSV23) vaccine. You may need one or two doses if you smoke cigarettes or if you have certain conditions. Talk to your health care provider about which screenings and vaccines you need and how often you need them. This information is not intended to replace advice given to you by your health care provider. Make sure you discuss any questions you have with your health care provider. Document Released: 04/22/2015 Document Revised: 12/14/2015 Document Reviewed: 01/25/2015 Elsevier Interactive Patient Education  2017 [place] Prevention in the Home Falls can cause injuries. They can happen to people of all ages. There are many things you can do to make your home safe and to help prevent falls. What can I do on the outside of my home?  Regularly fix the edges of walkways and driveways and fix any cracks.  Remove anything that might make you trip as you walk through a door, such as a raised step or threshold.  Trim any bushes or trees on the path to your home.  Use bright outdoor lighting.  Clear any walking paths of anything that might make someone trip, such as rocks or tools.  Regularly check to see if handrails are loose or broken. Make sure that both sides of any steps have handrails.  Any raised decks and porches should have guardrails on the edges.  Have any leaves, snow, or ice cleared regularly.  Use sand or salt on walking paths during winter.  Clean up any spills in your garage right  away. This includes oil or grease spills. What can I do in the bathroom?  Use night lights.  Install grab bars by the toilet and in the tub and shower. Do not use towel bars as grab bars.  Use non-skid mats or decals in the tub or shower.  If you need to sit down in the shower, use a plastic, non-slip stool.  Keep the floor dry. Clean up any water that spills on the floor as soon as it happens.  Remove soap buildup in the tub or shower regularly.  Attach bath mats securely with double-sided non-slip rug tape.  Do not have throw rugs and other things on the floor that can make you trip. What can I do in the bedroom?  Use night lights.  Make sure that you have a light by your bed that is easy to reach.  Do not use any sheets or blankets that are too big for your bed. They should not hang down onto the floor.  Have a firm chair that has side arms. You can use this for support while you get dressed.  Do not have throw rugs and other things on the floor that can make you trip. What can I do in  the kitchen?  Clean up any spills right away.  Avoid walking on wet floors.  Keep items that you use a lot in easy-to-reach places.  If you need to reach something above you, use a strong step stool that has a grab bar.  Keep electrical cords out of the way.  Do not use floor polish or wax that makes floors slippery. If you must use wax, use non-skid floor wax.  Do not have throw rugs and other things on the floor that can make you trip. What can I do with my stairs?  Do not leave any items on the stairs.  Make sure that there are handrails on both sides of the stairs and use them. Fix handrails that are broken or loose. Make sure that handrails are as long as the stairways.  Check any carpeting to make sure that it is firmly attached to the stairs. Fix any carpet that is loose or worn.  Avoid having throw rugs at the top or bottom of the stairs. If you do have throw rugs, attach  them to the floor with carpet tape.  Make sure that you have a light switch at the top of the stairs and the bottom of the stairs. If you do not have them, ask someone to add them for you. What else can I do to help prevent falls?  Wear shoes that:  Do not have high heels.  Have rubber bottoms.  Are comfortable and fit you well.  Are closed at the toe. Do not wear sandals.  If you use a stepladder:  Make sure that it is fully opened. Do not climb a closed stepladder.  Make sure that both sides of the stepladder are locked into place.  Ask someone to hold it for you, if possible.  Clearly mark and make sure that you can see:  Any grab bars or handrails.  First and last steps.  Where the edge of each step is.  Use tools that help you move around (mobility aids) if they are needed. These include:  Canes.  Walkers.  Scooters.  Crutches.  Turn on the lights when you go into a dark area. Replace any light bulbs as soon as they burn out.  Set up your furniture so you have a clear path. Avoid moving your furniture around.  If any of your floors are uneven, fix them.  If there are any pets around you, be aware of where they are.  Review your medicines with your doctor. Some medicines can make you feel dizzy. This can increase your chance of falling. Ask your doctor what other things that you can do to help prevent falls. This information is not intended to replace advice given to you by your health care provider. Make sure you discuss any questions you have with your health care provider. Document Released: 01/20/2009 Document Revised: 09/01/2015 Document Reviewed: 04/30/2014 Elsevier Interactive Patient Education  2017 [employer].

## 2019-08-18 NOTE — Progress Notes (Addendum)
Subjective:   Audrey Turner is a 59 y.o. female who presents for Medicare Annual (Subsequent) preventive examination.  Review of Systems: N/A   This visit is being conducted through telemedicine via telephone at the nurse health advisor's home address due to the COVID-19 pandemic. This patient has given me verbal consent via doximity to conduct this visit, patient states they are participating from their home address. Audrey Turner (brother) (DPR/HIPAA verified) assisted patient with the call. Audrey Turner, patient and myself are on the telephone call. There is no referral for this visit. Some vital signs may be absent or patient reported.    Patient identification: identified by name, DOB, and current address   Cardiac Risk Factors include: dyslipidemia     Objective:     Vitals: There were no vitals taken for this visit.  There is no height or weight on file to calculate BMI.  Advanced Directives 08/18/2019 06/15/2019 01/06/2019 05/03/2017 02/26/2016 12/09/2015 11/16/2015  Does Patient Have a Medical Advance Directive? Yes Yes Yes Yes No No Yes  Type of Paramedic of [place];Living will [place];Living will Living will [place];Living will - - [place]on [place]  Does patient want to make changes to medical advance directive? - - No - Patient declined - - - -  Copy of [place] in Chart? No - copy requested - - No - copy requested - - No - copy requested  Would patient like information on creating a medical advance directive? - - - - Yes - Educational materials given No - patient declined information -    Tobacco Social History   Tobacco Use  Smoking Status Never Smoker  Smokeless Tobacco Never Used     Counseling given: Not Answered   Clinical Intake:  Pre-visit preparation completed: Yes  Pain : No/denies pain     Nutritional Risks: None Diabetes: No  How often do you need to have  someone help you when you read instructions, pamphlets, or other written materials from your doctor or pharmacy?: 1 - Never What is the last grade level you completed in school?: BS degree  Interpreter Needed?: No  Information entered by :: CJohnson, LPN  Past Medical History:  Diagnosis Date  . Acute sinusitis, unspecified   . Astrocytoma [facility]) 01/2006 /09/2006   grade 3 atrocytoma s/p chemo/radiation/surgery at [facility], in remission since at least 2011  . Cancer ([place])    Brain   . Diarrhea   . Drug induced neutropenia(288.03)   . Primary exertional headache   . Recurrent depression ([place])   . Vaginitis    Past Surgical History:  Procedure Laterality Date  . [facility].  . d&c miscarriage  1993  . left frontal craniotomy w/ left subtotal resection of left frontal brain tumor  01/10/2006  . [facility] brain tumor surgery  01/13/2006  . NSVD x 3 - premie at 22 wks    . resection residual tumor  10/08/2006   Dr. Tommi Rumps: [facility]  . skull surg  2009/2010/2012   3 times past brain surg to close up opening   Family History  Problem Relation Age of Onset  . Hypertension Mother   . Hypertension Father   . COPD Father   . Sleep apnea Father   . Breast cancer Paternal Aunt        66's  . Brain cancer Paternal Aunt   . Ovarian cancer Maternal Aunt   .  Colon cancer Neg Hx    Social History   Socioeconomic History  . Marital status: Divorced    Spouse name: Not on file  . Number of children: 3  . Years of education: Not on file  . Highest education level: Not on file  Occupational History  . Occupation: stay at home mother  Tobacco Use  . Smoking status: Never Smoker  . Smokeless tobacco: Never Used  Substance and Sexual Activity  . Alcohol use: No  . Drug use: No  . Sexual activity: Never    Birth control/protection: None  Other Topics Concern  . Not on file  Social History Narrative   Divorced   From [place], [place]   Lives with brother.     Single  story house, with ramp.     Social Determinants of Health   Financial Resource Strain: Low Risk   . Difficulty of Paying Living Expenses: Not hard at all  Food Insecurity: No Food Insecurity  . Worried About Charity fundraiser in the Last Year: Never true  . Ran Out of Food in the Last Year: Never true  Transportation Needs: No Transportation Needs  . Lack of Transportation (Medical): No  . Lack of Transportation (Non-Medical): No  Physical Activity: Inactive  . Days of Exercise per Week: 0 days  . Minutes of Exercise per Session: 0 min  Stress: No Stress Concern Present  . Feeling of Stress : Not at all  Social Connections:   . Frequency of Communication with Friends and Family:   . Frequency of Social Gatherings with Friends and Family:   . Attends Religious Services:   . Active Member of Clubs or Organizations:   . Attends Archivist Meetings:   Marland Kitchen Marital Status:     Outpatient Encounter Medications as of 08/18/2019  Medication Sig  . Amantadine HCl 100 MG tablet Take 1 tab qam and 1 tab qnoon  . desvenlafaxine (PRISTIQ) 50 MG 24 hr tablet Take 50 mg by mouth daily.  Marland Kitchen levETIRAcetam (KEPPRA) 100 MG/ML solution Take 10 mLs (1,000 mg total) by mouth 2 (two) times daily. Shake well prior to administering.  . Multiple Vitamin (MULTIVITAMIN) capsule Take 1 capsule by mouth daily.    . ARIPiprazole (ABILIFY) 5 MG tablet Take 5 mg by mouth daily.   No facility-administered encounter medications on file as of 08/18/2019.    Activities of Daily Living In your present state of health, do you have any difficulty performing the following activities: 08/18/2019  Hearing? N  Vision? N  Difficulty concentrating or making decisions? N  Walking or climbing stairs? N  Dressing or bathing? Y  Doing errands, shopping? Y  Preparing Food and eating ? Y  Using the Toilet? N  In the past six months, have you accidently leaked urine? N  Do you have problems with loss of bowel  control? N  Managing your Medications? Y  Managing your Finances? Y  Housekeeping or managing your Housekeeping? Y  Some recent data might be hidden    Patient Care Team: Tonia Ghent, MD as PCP - General (Family Medicine) Ranell Patrick, MD as Referring Physician (Psychiatry) Thelma Comp, [place] as Referring Physician (Optometry) Encarnacion Slates, MD as Referring Physician (Neurology) Phineas Real, Belinda Block, MD (Inactive) as Consulting Physician (Gynecology) Henreitta Leber, DDS as Referring Physician (Dentistry) Cameron Sprang, MD as Consulting Physician (Neurology)    Assessment:   This is a routine wellness examination for [place].  Exercise Activities and Dietary  recommendations Current Exercise Habits: The patient does not participate in regular exercise at present, Exercise limited by: None identified  Goals    . Follow up with Primary Care Provider     Starting 05/03/2017, I will continue to take medications as prescribed and to keep appointments with PCP as scheduled.     . Patient Stated     08/18/2019, I will maintain and continue medications as prescribed        Fall Risk Fall Risk  08/18/2019 06/15/2019 02/28/2018 05/03/2017 07/17/2016  Falls in the past year? 0 0 0 Yes Yes  Comment - - - 3 assisted falls; caregivers allowed patient to slide down to floor -  Number falls in past yr: 0 0 0 2 or more 1  Injury with Fall? 0 0 0 No No  Risk Factor Category  - - - - -  Risk for fall due to : Medication side effect;Impaired mobility Impaired mobility - Impaired mobility;Impaired balance/gait;Mental status change -  Follow up Falls evaluation completed;Falls prevention discussed - - - -   Is the patient's home free of loose throw rugs in walkways, pet beds, electrical cords, etc?   yes      Grab bars in the bathroom? yes      Handrails on the stairs?   yes      Adequate lighting?   yes  Timed Get Up and Go performed: N/A  Depression Screen PHQ 2/9 Scores  08/18/2019 05/03/2017  PHQ - 2 Score 6 3  PHQ- 9 Score 6 9     Cognitive Function MMSE - Mini Mental State Exam 08/18/2019 05/03/2017  Not completed: Unable to complete -  Orientation to time - 5  Orientation to Place - 5  Registration - 3  Attention/ Calculation - 0  Recall - 1  Recall-comments - unable to recall 2 of 3 words  Language- name 2 objects - 0  Language- repeat - 1  Language- follow 3 step command - 3  Language- read & follow direction - 0  Write a sentence - 0  Copy design - 0  Total score - 18  Mini Cog  Mini-Cog screen was not completed. Patient kept falling asleep. Maximum score is 22. A value of 0 denotes this part of the MMSE was not completed or the patient failed this part of the Mini-Cog screening.       Immunization History  Administered Date(s) Administered  . Influenza,inj,Quad PF,6+ Mos 03/12/2013, 05/10/2017  . Influenza-Unspecified 04/03/2013, 05/01/2018  . PFIZER SARS-COV-2 Vaccination 06/21/2019, 07/16/2019  . Td 09/17/2002  . Tdap 03/12/2013  . Zoster Recombinat (Shingrix) 06/02/2019    Qualifies for Shingles Vaccine: Yes  Screening Tests Health Maintenance  Topic Date Due  . MAMMOGRAM  08/21/2014  . PAP SMEAR-Modifier  02/27/2016  . INFLUENZA VACCINE  11/08/2019  . TETANUS/TDAP  03/13/2023  . COLONOSCOPY  06/03/2023  . COVID-19 Vaccine  Completed  . Hepatitis C Screening  Completed  . HIV Screening  Completed    Cancer Screenings: Lung: Low Dose CT Chest recommended if Age 60-80 years, 30 pack-year currently smoking OR have quit w/in 15 years. Patient does not qualify. Breast:  Up to date on Mammogram: scheduled for 08/28/2019   Up to date of Bone Density/Dexa: N/A, starts at age 62 Colorectal: completed 06/02/2013  Additional Screenings:  Hepatitis C Screening: 04/09/2005     Plan:  Patient will maintain and continue medications as prescribed.    . Functional ability and status .  Nutritional status . Physical activity .  Advanced directives . List of other physicians . Hospitalizations, surgeries, and ER visits in previous 12 months . Vitals . Screenings to include cognitive, depression, and falls . Referrals and appointments  In addition, I have reviewed and discussed with patient certain preventive protocols, quality metrics, and best practice recommendations. A written personalized care plan for preventive services as well as general preventive health recommendations were provided to patient.     Andrez Grime, LPN  [id number]

## 2019-08-18 NOTE — Progress Notes (Signed)
PCP notes:  Health Maintenance: Pap-due   Abnormal Screenings: none   Patient concerns: Constipation issues Discuss PT/OT services   Nurse concerns: none   Next PCP appt.: 08/31/2019 @ 2:15 pm

## 2019-08-28 ENCOUNTER — Ambulatory Visit: Payer: Medicare HMO

## 2019-08-31 ENCOUNTER — Other Ambulatory Visit: Payer: Self-pay

## 2019-08-31 ENCOUNTER — Ambulatory Visit (INDEPENDENT_AMBULATORY_CARE_PROVIDER_SITE_OTHER): Payer: Medicare HMO | Admitting: Family Medicine

## 2019-08-31 ENCOUNTER — Encounter: Payer: Self-pay | Admitting: Family Medicine

## 2019-08-31 VITALS — BP 130/80 | HR 74 | Temp 96.9°F

## 2019-08-31 DIAGNOSIS — M24549 Contracture, unspecified hand: Secondary | ICD-10-CM | POA: Diagnosis not present

## 2019-08-31 DIAGNOSIS — R29898 Other symptoms and signs involving the musculoskeletal system: Secondary | ICD-10-CM | POA: Diagnosis not present

## 2019-08-31 DIAGNOSIS — C711 Malignant neoplasm of frontal lobe: Secondary | ICD-10-CM

## 2019-08-31 DIAGNOSIS — Z85841 Personal history of malignant neoplasm of brain: Secondary | ICD-10-CM | POA: Diagnosis not present

## 2019-08-31 DIAGNOSIS — G4734 Idiopathic sleep related nonobstructive alveolar hypoventilation: Secondary | ICD-10-CM | POA: Diagnosis not present

## 2019-08-31 DIAGNOSIS — Z7189 Other specified counseling: Secondary | ICD-10-CM

## 2019-08-31 DIAGNOSIS — R569 Unspecified convulsions: Secondary | ICD-10-CM

## 2019-08-31 DIAGNOSIS — Z0000 Encounter for general adult medical examination without abnormal findings: Secondary | ICD-10-CM

## 2019-08-31 NOTE — Patient Instructions (Addendum)
It would be okay to use miralax and/or metamucil daily as needed.   I will check on getting OT and PT set up.   I'll update neurology.  Take care.  Glad to see you.

## 2019-08-31 NOTE — Progress Notes (Signed)
This visit occurred during the SARS-CoV-2 public health emergency.  Safety protocols were in place, including screening questions prior to the visit, additional usage of staff PPE, and extensive cleaning of exam room while observing appropriate contact time as indicated for disinfecting solutions.  Constipation.  Has been taking miralax, used a few days in a row to get a BM.  No BM in the last 2 days.  Discussed options.  No abdominal pain.  See plan.  Still on O2 at night per pulmonary.  Compliant.  Seizure d/o per neuro w/o events in the interval since fall 2020.  She had SZ in 01/2019 with B arm weakness after that, with R>L weakness (developing R hand contracture).  She now leans to the L at baseline with sitting. She has more trouble feeding herself with her L hand.  No recent acute changes.  H/o astrocytoma per [facility] clinic.  Still at home with her brother, who is her primary caretaker.    Flu 05/01/2018 Shingles d/w pt.  To get 2nd dose today.   PNA due at 67.  Discussed with patient. covid vaccine 2021   Tetanus 2014 Colonoscopy 2015 Breast cancer screening pending DXA deferred given her age.  Advance directive-brother [name] designated if patient were incapacitated Pap smear deferred for now.  She declined.  Discussed.  No previous abnormals.  Meds, vitals, and allergies reviewed.   ROS: Per HPI unless specifically indicated in ROS section   nad ncat In wheelchair at baseline Neck supple, no lymphadenopathy rrr ctab abd soft, nontender, normal bowel sounds R hand contracture noted. R>L side grip weakness-this not an acute change. Skin without acute rash but she does have some chronic hemosiderin staining on the B ankles.    At least 30 minutes were devoted to patient care in this encounter (this can potentially include time spent reviewing the patient's file/history, interviewing and examining the patient, counseling/reviewing plan with patient, ordering referrals, ordering  tests, reviewing relevant laboratory or x-ray data, and documenting the encounter).

## 2019-09-03 DIAGNOSIS — J9610 Chronic respiratory failure, unspecified whether with hypoxia or hypercapnia: Secondary | ICD-10-CM | POA: Diagnosis not present

## 2019-09-08 ENCOUNTER — Telehealth: Payer: Self-pay | Admitting: Family Medicine

## 2019-09-08 DIAGNOSIS — M24549 Contracture, unspecified hand: Secondary | ICD-10-CM | POA: Insufficient documentation

## 2019-09-08 DIAGNOSIS — Z0000 Encounter for general adult medical examination without abnormal findings: Secondary | ICD-10-CM | POA: Insufficient documentation

## 2019-09-08 NOTE — Assessment & Plan Note (Signed)
History of, per [facility] clinic.  I will defer.  Patient agrees.

## 2019-09-08 NOTE — Assessment & Plan Note (Signed)
Refer for OT PT by home health.

## 2019-09-08 NOTE — Assessment & Plan Note (Addendum)
No seizures in the interval.  Compliant with her medications, Keppra.  Continue as is.  Followed by neurology.  I will update neurology in the meantime.

## 2019-09-08 NOTE — Assessment & Plan Note (Deleted)
History of, per [facility] clinic.

## 2019-09-08 NOTE — Telephone Encounter (Signed)
FYI.  I saw her recently in clinic and told her I would update you.  Her recent labs were unremarkable.  She does have right hand contracture and we are going to refer her to home health OT PT.  She has not had any new events since the fall.  Thanks.

## 2019-09-08 NOTE — Assessment & Plan Note (Signed)
Advance directive-brother [name] designated if patient were incapacitated

## 2019-09-08 NOTE — Assessment & Plan Note (Signed)
Without acute new findings but she does have weakness in grip and also contracture of the right hand.  Refer for OT PT.

## 2019-09-08 NOTE — Assessment & Plan Note (Signed)
Flu 05/01/2018 Shingles d/w pt.  To get 2nd dose today.   PNA due at 67.  Discussed with patient. covid vaccine 2021   Tetanus 2014 Colonoscopy 2015 Breast cancer screening pending DXA deferred given her age.  Advance directive-brother [name] designated if patient were incapacitated Pap smear deferred for now.  She declined.  Discussed.  No previous abnormals.

## 2019-09-08 NOTE — Assessment & Plan Note (Signed)
Continue nocturnal oxygen 

## 2019-09-09 ENCOUNTER — Ambulatory Visit
Admission: RE | Admit: 2019-09-09 | Discharge: 2019-09-09 | Disposition: A | Discharge status: Home / Self Care | Payer: Medicare HMO | Source: Ambulatory Visit | Attending: Family Medicine | Admitting: Family Medicine

## 2019-09-09 ENCOUNTER — Other Ambulatory Visit: Payer: Self-pay

## 2019-09-09 DIAGNOSIS — Z1231 Encounter for screening mammogram for malignant neoplasm of breast: Secondary | ICD-10-CM

## 2019-09-09 NOTE — Telephone Encounter (Signed)
Thank you for the update!

## 2019-09-10 ENCOUNTER — Telehealth: Payer: Self-pay

## 2019-09-10 NOTE — Telephone Encounter (Signed)
Audrey Turner with [facility] left v/m; Audrey Pimple with [facility] has been trying to see pt for [facility] admission. pts brother put off [facility] admission appt until 09/15/19. Is that OK with Dr Damita Dunnings? Audrey Pimple request cb.

## 2019-09-11 NOTE — Telephone Encounter (Signed)
[facility] advised.

## 2019-09-11 NOTE — Telephone Encounter (Signed)
Okay with me. Thanks 

## 2019-09-15 ENCOUNTER — Telehealth: Payer: Self-pay | Admitting: Family Medicine

## 2019-09-15 DIAGNOSIS — M24541 Contracture, right hand: Secondary | ICD-10-CM | POA: Diagnosis not present

## 2019-09-15 DIAGNOSIS — Z85841 Personal history of malignant neoplasm of brain: Secondary | ICD-10-CM | POA: Diagnosis not present

## 2019-09-15 DIAGNOSIS — G40909 Epilepsy, unspecified, not intractable, without status epilepticus: Secondary | ICD-10-CM | POA: Diagnosis not present

## 2019-09-15 DIAGNOSIS — G4733 Obstructive sleep apnea (adult) (pediatric): Secondary | ICD-10-CM | POA: Diagnosis not present

## 2019-09-15 DIAGNOSIS — R69 Illness, unspecified: Secondary | ICD-10-CM | POA: Diagnosis not present

## 2019-09-15 DIAGNOSIS — Z993 Dependence on wheelchair: Secondary | ICD-10-CM | POA: Diagnosis not present

## 2019-09-15 DIAGNOSIS — Z9181 History of falling: Secondary | ICD-10-CM | POA: Diagnosis not present

## 2019-09-15 DIAGNOSIS — K5900 Constipation, unspecified: Secondary | ICD-10-CM | POA: Diagnosis not present

## 2019-09-15 NOTE — Telephone Encounter (Signed)
Audrey Turner, [facility], called.  She needs verbal orders for physical thereapy 2 x a week for 6 weeks, 1 x a week for  3 weeks. A detailed message may be left on Audrey Turner's voice mail.

## 2019-09-16 NOTE — Telephone Encounter (Signed)
Please give the order.  Thanks.   

## 2019-09-17 NOTE — Telephone Encounter (Signed)
VO given to Lilly with [facility]

## 2019-09-17 NOTE — Telephone Encounter (Signed)
Audrey Turner with [facility] left v/m requesting verbal orders for [facility] PT 2 x a wk for 6 wks and 1 x a wk for 3 wks. FYI to [place].

## 2019-09-18 ENCOUNTER — Telehealth: Payer: Self-pay

## 2019-09-18 DIAGNOSIS — G4733 Obstructive sleep apnea (adult) (pediatric): Secondary | ICD-10-CM | POA: Diagnosis not present

## 2019-09-18 DIAGNOSIS — G40909 Epilepsy, unspecified, not intractable, without status epilepticus: Secondary | ICD-10-CM | POA: Diagnosis not present

## 2019-09-18 DIAGNOSIS — Z85841 Personal history of malignant neoplasm of brain: Secondary | ICD-10-CM | POA: Diagnosis not present

## 2019-09-18 DIAGNOSIS — Z993 Dependence on wheelchair: Secondary | ICD-10-CM | POA: Diagnosis not present

## 2019-09-18 DIAGNOSIS — Z9181 History of falling: Secondary | ICD-10-CM | POA: Diagnosis not present

## 2019-09-18 DIAGNOSIS — K5900 Constipation, unspecified: Secondary | ICD-10-CM | POA: Diagnosis not present

## 2019-09-18 DIAGNOSIS — R69 Illness, unspecified: Secondary | ICD-10-CM | POA: Diagnosis not present

## 2019-09-18 DIAGNOSIS — M24541 Contracture, right hand: Secondary | ICD-10-CM | POA: Diagnosis not present

## 2019-09-18 NOTE — Telephone Encounter (Signed)
Sherlynn Stalls, OT with [facility], left message on triage vm asking for extended OT Orders 1 x week for 1 week and 2 x week for 3 weeks. Please leave orders on VM if she does not answer.

## 2019-09-19 NOTE — Telephone Encounter (Signed)
Please give the order.  Thanks.   

## 2019-09-21 DIAGNOSIS — G4733 Obstructive sleep apnea (adult) (pediatric): Secondary | ICD-10-CM | POA: Diagnosis not present

## 2019-09-21 DIAGNOSIS — Z993 Dependence on wheelchair: Secondary | ICD-10-CM | POA: Diagnosis not present

## 2019-09-21 DIAGNOSIS — R69 Illness, unspecified: Secondary | ICD-10-CM | POA: Diagnosis not present

## 2019-09-21 DIAGNOSIS — M24541 Contracture, right hand: Secondary | ICD-10-CM | POA: Diagnosis not present

## 2019-09-21 DIAGNOSIS — K5900 Constipation, unspecified: Secondary | ICD-10-CM | POA: Diagnosis not present

## 2019-09-21 DIAGNOSIS — G40909 Epilepsy, unspecified, not intractable, without status epilepticus: Secondary | ICD-10-CM | POA: Diagnosis not present

## 2019-09-21 DIAGNOSIS — Z9181 History of falling: Secondary | ICD-10-CM | POA: Diagnosis not present

## 2019-09-21 DIAGNOSIS — Z85841 Personal history of malignant neoplasm of brain: Secondary | ICD-10-CM | POA: Diagnosis not present

## 2019-09-21 NOTE — Telephone Encounter (Signed)
Left detailed message on voicemail of Sherlynn Stalls, OT with [facility].

## 2019-09-22 DIAGNOSIS — G40909 Epilepsy, unspecified, not intractable, without status epilepticus: Secondary | ICD-10-CM | POA: Diagnosis not present

## 2019-09-22 DIAGNOSIS — R69 Illness, unspecified: Secondary | ICD-10-CM | POA: Diagnosis not present

## 2019-09-22 DIAGNOSIS — Z993 Dependence on wheelchair: Secondary | ICD-10-CM | POA: Diagnosis not present

## 2019-09-22 DIAGNOSIS — K5900 Constipation, unspecified: Secondary | ICD-10-CM | POA: Diagnosis not present

## 2019-09-22 DIAGNOSIS — M24541 Contracture, right hand: Secondary | ICD-10-CM | POA: Diagnosis not present

## 2019-09-22 DIAGNOSIS — G4733 Obstructive sleep apnea (adult) (pediatric): Secondary | ICD-10-CM | POA: Diagnosis not present

## 2019-09-22 DIAGNOSIS — Z9181 History of falling: Secondary | ICD-10-CM | POA: Diagnosis not present

## 2019-09-22 DIAGNOSIS — Z85841 Personal history of malignant neoplasm of brain: Secondary | ICD-10-CM | POA: Diagnosis not present

## 2019-09-23 DIAGNOSIS — G40909 Epilepsy, unspecified, not intractable, without status epilepticus: Secondary | ICD-10-CM | POA: Diagnosis not present

## 2019-09-23 DIAGNOSIS — K5900 Constipation, unspecified: Secondary | ICD-10-CM | POA: Diagnosis not present

## 2019-09-23 DIAGNOSIS — R69 Illness, unspecified: Secondary | ICD-10-CM | POA: Diagnosis not present

## 2019-09-23 DIAGNOSIS — M24541 Contracture, right hand: Secondary | ICD-10-CM | POA: Diagnosis not present

## 2019-09-23 DIAGNOSIS — Z993 Dependence on wheelchair: Secondary | ICD-10-CM | POA: Diagnosis not present

## 2019-09-23 DIAGNOSIS — G4733 Obstructive sleep apnea (adult) (pediatric): Secondary | ICD-10-CM | POA: Diagnosis not present

## 2019-09-23 DIAGNOSIS — Z9181 History of falling: Secondary | ICD-10-CM | POA: Diagnosis not present

## 2019-09-23 DIAGNOSIS — Z85841 Personal history of malignant neoplasm of brain: Secondary | ICD-10-CM | POA: Diagnosis not present

## 2019-09-24 DIAGNOSIS — M24541 Contracture, right hand: Secondary | ICD-10-CM | POA: Diagnosis not present

## 2019-09-24 DIAGNOSIS — Z993 Dependence on wheelchair: Secondary | ICD-10-CM | POA: Diagnosis not present

## 2019-09-24 DIAGNOSIS — R69 Illness, unspecified: Secondary | ICD-10-CM | POA: Diagnosis not present

## 2019-09-24 DIAGNOSIS — G40909 Epilepsy, unspecified, not intractable, without status epilepticus: Secondary | ICD-10-CM | POA: Diagnosis not present

## 2019-09-24 DIAGNOSIS — Z85841 Personal history of malignant neoplasm of brain: Secondary | ICD-10-CM | POA: Diagnosis not present

## 2019-09-24 DIAGNOSIS — G4733 Obstructive sleep apnea (adult) (pediatric): Secondary | ICD-10-CM | POA: Diagnosis not present

## 2019-09-24 DIAGNOSIS — K5900 Constipation, unspecified: Secondary | ICD-10-CM | POA: Diagnosis not present

## 2019-09-24 DIAGNOSIS — Z9181 History of falling: Secondary | ICD-10-CM | POA: Diagnosis not present

## 2019-09-25 DIAGNOSIS — F0789 Other personality and behavioral disorders due to known physiological condition: Secondary | ICD-10-CM | POA: Diagnosis not present

## 2019-09-25 DIAGNOSIS — R69 Illness, unspecified: Secondary | ICD-10-CM | POA: Diagnosis not present

## 2019-09-25 DIAGNOSIS — F330 Major depressive disorder, recurrent, mild: Secondary | ICD-10-CM | POA: Diagnosis not present

## 2019-09-27 DIAGNOSIS — G4733 Obstructive sleep apnea (adult) (pediatric): Secondary | ICD-10-CM | POA: Diagnosis not present

## 2019-09-27 DIAGNOSIS — G40909 Epilepsy, unspecified, not intractable, without status epilepticus: Secondary | ICD-10-CM | POA: Diagnosis not present

## 2019-09-27 DIAGNOSIS — K5900 Constipation, unspecified: Secondary | ICD-10-CM | POA: Diagnosis not present

## 2019-09-27 DIAGNOSIS — F329 Major depressive disorder, single episode, unspecified: Secondary | ICD-10-CM

## 2019-09-27 DIAGNOSIS — Z993 Dependence on wheelchair: Secondary | ICD-10-CM

## 2019-09-27 DIAGNOSIS — Z9181 History of falling: Secondary | ICD-10-CM

## 2019-09-27 DIAGNOSIS — Z85841 Personal history of malignant neoplasm of brain: Secondary | ICD-10-CM

## 2019-09-27 DIAGNOSIS — R69 Illness, unspecified: Secondary | ICD-10-CM | POA: Diagnosis not present

## 2019-09-28 DIAGNOSIS — G4733 Obstructive sleep apnea (adult) (pediatric): Secondary | ICD-10-CM | POA: Diagnosis not present

## 2019-09-28 DIAGNOSIS — K5900 Constipation, unspecified: Secondary | ICD-10-CM | POA: Diagnosis not present

## 2019-09-28 DIAGNOSIS — Z85841 Personal history of malignant neoplasm of brain: Secondary | ICD-10-CM | POA: Diagnosis not present

## 2019-09-28 DIAGNOSIS — G40909 Epilepsy, unspecified, not intractable, without status epilepticus: Secondary | ICD-10-CM | POA: Diagnosis not present

## 2019-09-28 DIAGNOSIS — M24541 Contracture, right hand: Secondary | ICD-10-CM | POA: Diagnosis not present

## 2019-09-28 DIAGNOSIS — Z993 Dependence on wheelchair: Secondary | ICD-10-CM | POA: Diagnosis not present

## 2019-09-28 DIAGNOSIS — R69 Illness, unspecified: Secondary | ICD-10-CM | POA: Diagnosis not present

## 2019-09-28 DIAGNOSIS — Z9181 History of falling: Secondary | ICD-10-CM | POA: Diagnosis not present

## 2019-09-29 DIAGNOSIS — Z9181 History of falling: Secondary | ICD-10-CM | POA: Diagnosis not present

## 2019-09-29 DIAGNOSIS — Z85841 Personal history of malignant neoplasm of brain: Secondary | ICD-10-CM | POA: Diagnosis not present

## 2019-09-29 DIAGNOSIS — R69 Illness, unspecified: Secondary | ICD-10-CM | POA: Diagnosis not present

## 2019-09-29 DIAGNOSIS — K5900 Constipation, unspecified: Secondary | ICD-10-CM | POA: Diagnosis not present

## 2019-09-29 DIAGNOSIS — Z993 Dependence on wheelchair: Secondary | ICD-10-CM | POA: Diagnosis not present

## 2019-09-29 DIAGNOSIS — M24541 Contracture, right hand: Secondary | ICD-10-CM | POA: Diagnosis not present

## 2019-09-29 DIAGNOSIS — G4733 Obstructive sleep apnea (adult) (pediatric): Secondary | ICD-10-CM | POA: Diagnosis not present

## 2019-09-29 DIAGNOSIS — G40909 Epilepsy, unspecified, not intractable, without status epilepticus: Secondary | ICD-10-CM | POA: Diagnosis not present

## 2019-09-30 DIAGNOSIS — M24541 Contracture, right hand: Secondary | ICD-10-CM | POA: Diagnosis not present

## 2019-09-30 DIAGNOSIS — R69 Illness, unspecified: Secondary | ICD-10-CM | POA: Diagnosis not present

## 2019-09-30 DIAGNOSIS — Z993 Dependence on wheelchair: Secondary | ICD-10-CM | POA: Diagnosis not present

## 2019-09-30 DIAGNOSIS — Z85841 Personal history of malignant neoplasm of brain: Secondary | ICD-10-CM | POA: Diagnosis not present

## 2019-09-30 DIAGNOSIS — Z9181 History of falling: Secondary | ICD-10-CM | POA: Diagnosis not present

## 2019-09-30 DIAGNOSIS — G4733 Obstructive sleep apnea (adult) (pediatric): Secondary | ICD-10-CM | POA: Diagnosis not present

## 2019-09-30 DIAGNOSIS — G40909 Epilepsy, unspecified, not intractable, without status epilepticus: Secondary | ICD-10-CM | POA: Diagnosis not present

## 2019-09-30 DIAGNOSIS — K5900 Constipation, unspecified: Secondary | ICD-10-CM | POA: Diagnosis not present

## 2019-10-01 DIAGNOSIS — Z9181 History of falling: Secondary | ICD-10-CM | POA: Diagnosis not present

## 2019-10-01 DIAGNOSIS — G4733 Obstructive sleep apnea (adult) (pediatric): Secondary | ICD-10-CM | POA: Diagnosis not present

## 2019-10-01 DIAGNOSIS — Z993 Dependence on wheelchair: Secondary | ICD-10-CM | POA: Diagnosis not present

## 2019-10-01 DIAGNOSIS — R69 Illness, unspecified: Secondary | ICD-10-CM | POA: Diagnosis not present

## 2019-10-01 DIAGNOSIS — K5900 Constipation, unspecified: Secondary | ICD-10-CM | POA: Diagnosis not present

## 2019-10-01 DIAGNOSIS — Z85841 Personal history of malignant neoplasm of brain: Secondary | ICD-10-CM | POA: Diagnosis not present

## 2019-10-01 DIAGNOSIS — M24541 Contracture, right hand: Secondary | ICD-10-CM | POA: Diagnosis not present

## 2019-10-01 DIAGNOSIS — G40909 Epilepsy, unspecified, not intractable, without status epilepticus: Secondary | ICD-10-CM | POA: Diagnosis not present

## 2019-10-04 DIAGNOSIS — J9610 Chronic respiratory failure, unspecified whether with hypoxia or hypercapnia: Secondary | ICD-10-CM | POA: Diagnosis not present

## 2019-10-05 DIAGNOSIS — Z9181 History of falling: Secondary | ICD-10-CM | POA: Diagnosis not present

## 2019-10-05 DIAGNOSIS — K5900 Constipation, unspecified: Secondary | ICD-10-CM | POA: Diagnosis not present

## 2019-10-05 DIAGNOSIS — G40909 Epilepsy, unspecified, not intractable, without status epilepticus: Secondary | ICD-10-CM | POA: Diagnosis not present

## 2019-10-05 DIAGNOSIS — Z85841 Personal history of malignant neoplasm of brain: Secondary | ICD-10-CM | POA: Diagnosis not present

## 2019-10-05 DIAGNOSIS — R69 Illness, unspecified: Secondary | ICD-10-CM | POA: Diagnosis not present

## 2019-10-05 DIAGNOSIS — G4733 Obstructive sleep apnea (adult) (pediatric): Secondary | ICD-10-CM | POA: Diagnosis not present

## 2019-10-05 DIAGNOSIS — M24541 Contracture, right hand: Secondary | ICD-10-CM | POA: Diagnosis not present

## 2019-10-05 DIAGNOSIS — Z993 Dependence on wheelchair: Secondary | ICD-10-CM | POA: Diagnosis not present

## 2019-10-06 DIAGNOSIS — M24541 Contracture, right hand: Secondary | ICD-10-CM | POA: Diagnosis not present

## 2019-10-06 DIAGNOSIS — G40909 Epilepsy, unspecified, not intractable, without status epilepticus: Secondary | ICD-10-CM | POA: Diagnosis not present

## 2019-10-06 DIAGNOSIS — G4733 Obstructive sleep apnea (adult) (pediatric): Secondary | ICD-10-CM | POA: Diagnosis not present

## 2019-10-06 DIAGNOSIS — Z993 Dependence on wheelchair: Secondary | ICD-10-CM | POA: Diagnosis not present

## 2019-10-06 DIAGNOSIS — K5900 Constipation, unspecified: Secondary | ICD-10-CM | POA: Diagnosis not present

## 2019-10-06 DIAGNOSIS — Z9181 History of falling: Secondary | ICD-10-CM | POA: Diagnosis not present

## 2019-10-06 DIAGNOSIS — R69 Illness, unspecified: Secondary | ICD-10-CM | POA: Diagnosis not present

## 2019-10-06 DIAGNOSIS — Z85841 Personal history of malignant neoplasm of brain: Secondary | ICD-10-CM | POA: Diagnosis not present

## 2019-10-07 DIAGNOSIS — M24541 Contracture, right hand: Secondary | ICD-10-CM | POA: Diagnosis not present

## 2019-10-07 DIAGNOSIS — Z9181 History of falling: Secondary | ICD-10-CM | POA: Diagnosis not present

## 2019-10-07 DIAGNOSIS — Z993 Dependence on wheelchair: Secondary | ICD-10-CM | POA: Diagnosis not present

## 2019-10-07 DIAGNOSIS — G4733 Obstructive sleep apnea (adult) (pediatric): Secondary | ICD-10-CM | POA: Diagnosis not present

## 2019-10-07 DIAGNOSIS — K5900 Constipation, unspecified: Secondary | ICD-10-CM | POA: Diagnosis not present

## 2019-10-07 DIAGNOSIS — Z85841 Personal history of malignant neoplasm of brain: Secondary | ICD-10-CM | POA: Diagnosis not present

## 2019-10-07 DIAGNOSIS — R69 Illness, unspecified: Secondary | ICD-10-CM | POA: Diagnosis not present

## 2019-10-07 DIAGNOSIS — G40909 Epilepsy, unspecified, not intractable, without status epilepticus: Secondary | ICD-10-CM | POA: Diagnosis not present

## 2019-10-08 ENCOUNTER — Telehealth: Payer: Self-pay

## 2019-10-08 DIAGNOSIS — G4733 Obstructive sleep apnea (adult) (pediatric): Secondary | ICD-10-CM | POA: Diagnosis not present

## 2019-10-08 DIAGNOSIS — K5900 Constipation, unspecified: Secondary | ICD-10-CM | POA: Diagnosis not present

## 2019-10-08 DIAGNOSIS — Z993 Dependence on wheelchair: Secondary | ICD-10-CM | POA: Diagnosis not present

## 2019-10-08 DIAGNOSIS — M24541 Contracture, right hand: Secondary | ICD-10-CM | POA: Diagnosis not present

## 2019-10-08 DIAGNOSIS — R69 Illness, unspecified: Secondary | ICD-10-CM | POA: Diagnosis not present

## 2019-10-08 DIAGNOSIS — Z9181 History of falling: Secondary | ICD-10-CM | POA: Diagnosis not present

## 2019-10-08 DIAGNOSIS — G40909 Epilepsy, unspecified, not intractable, without status epilepticus: Secondary | ICD-10-CM | POA: Diagnosis not present

## 2019-10-08 DIAGNOSIS — Z85841 Personal history of malignant neoplasm of brain: Secondary | ICD-10-CM | POA: Diagnosis not present

## 2019-10-08 NOTE — Telephone Encounter (Signed)
Please give the order.  Thanks.   

## 2019-10-08 NOTE — Telephone Encounter (Signed)
Esther OT with [facility] left v/m requesting verbal orders to extend [facility] OT orders 2 x a wk for 4 wks.Please advise.

## 2019-10-08 NOTE — Telephone Encounter (Signed)
Audrey Turner with [facility] advised.

## 2019-10-14 DIAGNOSIS — G40909 Epilepsy, unspecified, not intractable, without status epilepticus: Secondary | ICD-10-CM | POA: Diagnosis not present

## 2019-10-14 DIAGNOSIS — Z85841 Personal history of malignant neoplasm of brain: Secondary | ICD-10-CM | POA: Diagnosis not present

## 2019-10-14 DIAGNOSIS — R69 Illness, unspecified: Secondary | ICD-10-CM | POA: Diagnosis not present

## 2019-10-14 DIAGNOSIS — K5900 Constipation, unspecified: Secondary | ICD-10-CM | POA: Diagnosis not present

## 2019-10-14 DIAGNOSIS — G4733 Obstructive sleep apnea (adult) (pediatric): Secondary | ICD-10-CM | POA: Diagnosis not present

## 2019-10-14 DIAGNOSIS — M24541 Contracture, right hand: Secondary | ICD-10-CM | POA: Diagnosis not present

## 2019-10-14 DIAGNOSIS — Z9181 History of falling: Secondary | ICD-10-CM | POA: Diagnosis not present

## 2019-10-14 DIAGNOSIS — Z993 Dependence on wheelchair: Secondary | ICD-10-CM | POA: Diagnosis not present

## 2019-10-16 DIAGNOSIS — M24541 Contracture, right hand: Secondary | ICD-10-CM | POA: Diagnosis not present

## 2019-10-16 DIAGNOSIS — K5900 Constipation, unspecified: Secondary | ICD-10-CM | POA: Diagnosis not present

## 2019-10-16 DIAGNOSIS — R69 Illness, unspecified: Secondary | ICD-10-CM | POA: Diagnosis not present

## 2019-10-16 DIAGNOSIS — Z85841 Personal history of malignant neoplasm of brain: Secondary | ICD-10-CM | POA: Diagnosis not present

## 2019-10-16 DIAGNOSIS — Z993 Dependence on wheelchair: Secondary | ICD-10-CM | POA: Diagnosis not present

## 2019-10-16 DIAGNOSIS — G4733 Obstructive sleep apnea (adult) (pediatric): Secondary | ICD-10-CM | POA: Diagnosis not present

## 2019-10-16 DIAGNOSIS — G40909 Epilepsy, unspecified, not intractable, without status epilepticus: Secondary | ICD-10-CM | POA: Diagnosis not present

## 2019-10-16 DIAGNOSIS — Z9181 History of falling: Secondary | ICD-10-CM | POA: Diagnosis not present

## 2019-10-19 DIAGNOSIS — K5900 Constipation, unspecified: Secondary | ICD-10-CM | POA: Diagnosis not present

## 2019-10-19 DIAGNOSIS — Z993 Dependence on wheelchair: Secondary | ICD-10-CM | POA: Diagnosis not present

## 2019-10-19 DIAGNOSIS — G4733 Obstructive sleep apnea (adult) (pediatric): Secondary | ICD-10-CM | POA: Diagnosis not present

## 2019-10-19 DIAGNOSIS — M24541 Contracture, right hand: Secondary | ICD-10-CM | POA: Diagnosis not present

## 2019-10-19 DIAGNOSIS — R69 Illness, unspecified: Secondary | ICD-10-CM | POA: Diagnosis not present

## 2019-10-19 DIAGNOSIS — Z85841 Personal history of malignant neoplasm of brain: Secondary | ICD-10-CM | POA: Diagnosis not present

## 2019-10-19 DIAGNOSIS — Z9181 History of falling: Secondary | ICD-10-CM | POA: Diagnosis not present

## 2019-10-19 DIAGNOSIS — G40909 Epilepsy, unspecified, not intractable, without status epilepticus: Secondary | ICD-10-CM | POA: Diagnosis not present

## 2019-10-21 DIAGNOSIS — M24541 Contracture, right hand: Secondary | ICD-10-CM | POA: Diagnosis not present

## 2019-10-21 DIAGNOSIS — Z85841 Personal history of malignant neoplasm of brain: Secondary | ICD-10-CM | POA: Diagnosis not present

## 2019-10-21 DIAGNOSIS — Z993 Dependence on wheelchair: Secondary | ICD-10-CM | POA: Diagnosis not present

## 2019-10-21 DIAGNOSIS — G4733 Obstructive sleep apnea (adult) (pediatric): Secondary | ICD-10-CM | POA: Diagnosis not present

## 2019-10-21 DIAGNOSIS — R69 Illness, unspecified: Secondary | ICD-10-CM | POA: Diagnosis not present

## 2019-10-21 DIAGNOSIS — K5900 Constipation, unspecified: Secondary | ICD-10-CM | POA: Diagnosis not present

## 2019-10-21 DIAGNOSIS — Z9181 History of falling: Secondary | ICD-10-CM | POA: Diagnosis not present

## 2019-10-21 DIAGNOSIS — G40909 Epilepsy, unspecified, not intractable, without status epilepticus: Secondary | ICD-10-CM | POA: Diagnosis not present

## 2019-10-26 DIAGNOSIS — M24541 Contracture, right hand: Secondary | ICD-10-CM | POA: Diagnosis not present

## 2019-10-26 DIAGNOSIS — R69 Illness, unspecified: Secondary | ICD-10-CM | POA: Diagnosis not present

## 2019-10-26 DIAGNOSIS — Z993 Dependence on wheelchair: Secondary | ICD-10-CM | POA: Diagnosis not present

## 2019-10-26 DIAGNOSIS — G40909 Epilepsy, unspecified, not intractable, without status epilepticus: Secondary | ICD-10-CM | POA: Diagnosis not present

## 2019-10-26 DIAGNOSIS — K5900 Constipation, unspecified: Secondary | ICD-10-CM | POA: Diagnosis not present

## 2019-10-26 DIAGNOSIS — G4733 Obstructive sleep apnea (adult) (pediatric): Secondary | ICD-10-CM | POA: Diagnosis not present

## 2019-10-26 DIAGNOSIS — Z9181 History of falling: Secondary | ICD-10-CM | POA: Diagnosis not present

## 2019-10-26 DIAGNOSIS — Z85841 Personal history of malignant neoplasm of brain: Secondary | ICD-10-CM | POA: Diagnosis not present

## 2019-10-28 DIAGNOSIS — M24541 Contracture, right hand: Secondary | ICD-10-CM | POA: Diagnosis not present

## 2019-10-28 DIAGNOSIS — Z9181 History of falling: Secondary | ICD-10-CM | POA: Diagnosis not present

## 2019-10-28 DIAGNOSIS — G40909 Epilepsy, unspecified, not intractable, without status epilepticus: Secondary | ICD-10-CM | POA: Diagnosis not present

## 2019-10-28 DIAGNOSIS — Z85841 Personal history of malignant neoplasm of brain: Secondary | ICD-10-CM | POA: Diagnosis not present

## 2019-10-28 DIAGNOSIS — Z993 Dependence on wheelchair: Secondary | ICD-10-CM | POA: Diagnosis not present

## 2019-10-28 DIAGNOSIS — G4733 Obstructive sleep apnea (adult) (pediatric): Secondary | ICD-10-CM | POA: Diagnosis not present

## 2019-10-28 DIAGNOSIS — K5900 Constipation, unspecified: Secondary | ICD-10-CM | POA: Diagnosis not present

## 2019-10-28 DIAGNOSIS — R69 Illness, unspecified: Secondary | ICD-10-CM | POA: Diagnosis not present

## 2019-11-02 DIAGNOSIS — K5900 Constipation, unspecified: Secondary | ICD-10-CM | POA: Diagnosis not present

## 2019-11-02 DIAGNOSIS — M24541 Contracture, right hand: Secondary | ICD-10-CM | POA: Diagnosis not present

## 2019-11-02 DIAGNOSIS — Z993 Dependence on wheelchair: Secondary | ICD-10-CM | POA: Diagnosis not present

## 2019-11-02 DIAGNOSIS — Z9181 History of falling: Secondary | ICD-10-CM | POA: Diagnosis not present

## 2019-11-02 DIAGNOSIS — Z85841 Personal history of malignant neoplasm of brain: Secondary | ICD-10-CM | POA: Diagnosis not present

## 2019-11-02 DIAGNOSIS — G40909 Epilepsy, unspecified, not intractable, without status epilepticus: Secondary | ICD-10-CM | POA: Diagnosis not present

## 2019-11-02 DIAGNOSIS — R69 Illness, unspecified: Secondary | ICD-10-CM | POA: Diagnosis not present

## 2019-11-02 DIAGNOSIS — G4733 Obstructive sleep apnea (adult) (pediatric): Secondary | ICD-10-CM | POA: Diagnosis not present

## 2019-11-03 ENCOUNTER — Telehealth: Payer: Self-pay | Admitting: Family Medicine

## 2019-11-03 DIAGNOSIS — J9610 Chronic respiratory failure, unspecified whether with hypoxia or hypercapnia: Secondary | ICD-10-CM | POA: Diagnosis not present

## 2019-11-03 NOTE — Telephone Encounter (Signed)
[name] (brother) advised and Rx left at front desk for pickup.

## 2019-11-03 NOTE — Telephone Encounter (Signed)
Patient's brother called in stating he is needing to see if PCP will write an order for TED hose, as patient currently has PT and OT working with her. PT noticed her legs are swelling some and recommended TED hose. Pt's sibling is inquiring if they need an order or can buy anywhere. Please advise.

## 2019-11-03 NOTE — Telephone Encounter (Signed)
I wrote the order on a hard copy rx for 20-58mm Hg knee high compression stockings, disp 2 pair, Dx R60.0.  They can try using that at DME store or they can try getting OTC stockings.  Either would be an okay option initially.  Thanks.

## 2019-11-04 DIAGNOSIS — K5900 Constipation, unspecified: Secondary | ICD-10-CM | POA: Diagnosis not present

## 2019-11-04 DIAGNOSIS — M24541 Contracture, right hand: Secondary | ICD-10-CM | POA: Diagnosis not present

## 2019-11-04 DIAGNOSIS — G4733 Obstructive sleep apnea (adult) (pediatric): Secondary | ICD-10-CM | POA: Diagnosis not present

## 2019-11-04 DIAGNOSIS — Z993 Dependence on wheelchair: Secondary | ICD-10-CM | POA: Diagnosis not present

## 2019-11-04 DIAGNOSIS — Z85841 Personal history of malignant neoplasm of brain: Secondary | ICD-10-CM | POA: Diagnosis not present

## 2019-11-04 DIAGNOSIS — R69 Illness, unspecified: Secondary | ICD-10-CM | POA: Diagnosis not present

## 2019-11-04 DIAGNOSIS — Z9181 History of falling: Secondary | ICD-10-CM | POA: Diagnosis not present

## 2019-11-04 DIAGNOSIS — G40909 Epilepsy, unspecified, not intractable, without status epilepticus: Secondary | ICD-10-CM | POA: Diagnosis not present

## 2019-11-05 ENCOUNTER — Telehealth: Payer: Self-pay | Admitting: Family Medicine

## 2019-11-05 NOTE — Telephone Encounter (Signed)
Home Health Verbal Orders - Caller/Agency: Luz Brazen Rule Number: [phone] Requesting OT/PT/Skilled Nursing/Social Work/Speech Therapy: OT Frequency: 2 week 1

## 2019-11-06 NOTE — Telephone Encounter (Signed)
Please give the order.  Thanks.   

## 2019-11-06 NOTE — Telephone Encounter (Signed)
Order given to [employer] [employer].

## 2019-11-12 DIAGNOSIS — R69 Illness, unspecified: Secondary | ICD-10-CM | POA: Diagnosis not present

## 2019-11-12 DIAGNOSIS — Z85841 Personal history of malignant neoplasm of brain: Secondary | ICD-10-CM | POA: Diagnosis not present

## 2019-11-12 DIAGNOSIS — Z993 Dependence on wheelchair: Secondary | ICD-10-CM | POA: Diagnosis not present

## 2019-11-12 DIAGNOSIS — Z9181 History of falling: Secondary | ICD-10-CM | POA: Diagnosis not present

## 2019-11-12 DIAGNOSIS — M24541 Contracture, right hand: Secondary | ICD-10-CM | POA: Diagnosis not present

## 2019-11-12 DIAGNOSIS — G40909 Epilepsy, unspecified, not intractable, without status epilepticus: Secondary | ICD-10-CM | POA: Diagnosis not present

## 2019-11-12 DIAGNOSIS — K5900 Constipation, unspecified: Secondary | ICD-10-CM | POA: Diagnosis not present

## 2019-11-12 DIAGNOSIS — G4733 Obstructive sleep apnea (adult) (pediatric): Secondary | ICD-10-CM | POA: Diagnosis not present

## 2019-12-04 DIAGNOSIS — J9610 Chronic respiratory failure, unspecified whether with hypoxia or hypercapnia: Secondary | ICD-10-CM | POA: Diagnosis not present

## 2020-01-04 DIAGNOSIS — J9610 Chronic respiratory failure, unspecified whether with hypoxia or hypercapnia: Secondary | ICD-10-CM | POA: Diagnosis not present

## 2020-02-03 DIAGNOSIS — J9610 Chronic respiratory failure, unspecified whether with hypoxia or hypercapnia: Secondary | ICD-10-CM | POA: Diagnosis not present

## 2020-03-05 DIAGNOSIS — J9610 Chronic respiratory failure, unspecified whether with hypoxia or hypercapnia: Secondary | ICD-10-CM | POA: Diagnosis not present

## 2020-04-04 DIAGNOSIS — J9610 Chronic respiratory failure, unspecified whether with hypoxia or hypercapnia: Secondary | ICD-10-CM | POA: Diagnosis not present

## 2020-05-05 ENCOUNTER — Ambulatory Visit: Payer: Medicare HMO | Admitting: Internal Medicine

## 2020-05-05 DIAGNOSIS — J9610 Chronic respiratory failure, unspecified whether with hypoxia or hypercapnia: Secondary | ICD-10-CM | POA: Diagnosis not present

## 2020-06-05 DIAGNOSIS — J9610 Chronic respiratory failure, unspecified whether with hypoxia or hypercapnia: Secondary | ICD-10-CM | POA: Diagnosis not present

## 2020-06-06 ENCOUNTER — Other Ambulatory Visit: Payer: Self-pay | Admitting: Neurology

## 2020-06-06 DIAGNOSIS — G40209 Localization-related (focal) (partial) symptomatic epilepsy and epileptic syndromes with complex partial seizures, not intractable, without status epilepticus: Secondary | ICD-10-CM

## 2020-06-06 DIAGNOSIS — Z85841 Personal history of malignant neoplasm of brain: Secondary | ICD-10-CM

## 2020-06-07 ENCOUNTER — Telehealth: Payer: Self-pay | Admitting: Neurology

## 2020-06-07 ENCOUNTER — Other Ambulatory Visit: Payer: Self-pay

## 2020-06-07 DIAGNOSIS — Z85841 Personal history of malignant neoplasm of brain: Secondary | ICD-10-CM

## 2020-06-07 DIAGNOSIS — G40209 Localization-related (focal) (partial) symptomatic epilepsy and epileptic syndromes with complex partial seizures, not intractable, without status epilepticus: Secondary | ICD-10-CM

## 2020-06-07 MED ORDER — LEVETIRACETAM 100 MG/ML PO SOLN: 1000.0000 mg | Freq: Two times a day (BID) | ORAL | 1 refills | Status: DC

## 2020-06-07 NOTE — Telephone Encounter (Signed)
Refill called in to pharmacy on file

## 2020-06-07 NOTE — Telephone Encounter (Signed)
Patient's brother called in and stated the patient needs a refill on her Keppra. I scheduled an appointment for her to follow up with Dr. Delice Lesch on 02/13/21. He said she will run out of the [place] on Thursday.

## 2020-06-27 ENCOUNTER — Ambulatory Visit: Payer: Medicare HMO | Admitting: Internal Medicine

## 2020-07-03 DIAGNOSIS — J9610 Chronic respiratory failure, unspecified whether with hypoxia or hypercapnia: Secondary | ICD-10-CM | POA: Diagnosis not present

## 2020-07-21 ENCOUNTER — Ambulatory Visit: Payer: Medicare HMO | Admitting: Internal Medicine

## 2020-08-03 DIAGNOSIS — J9610 Chronic respiratory failure, unspecified whether with hypoxia or hypercapnia: Secondary | ICD-10-CM | POA: Diagnosis not present

## 2020-08-17 NOTE — Progress Notes (Signed)
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  NPSG 02/26/16- AHI 3.7/hour (WNL) with mean oxygen saturation 93.4% and nadir 65%, moderate snoring, body weight 168 pounds. Office Spirometry 07/03/2016-unreliable, submaximal effort read as "moderate restriction". FVC 1.90/66%, FEV1 1.43/63%, ratio 0.75, FEF 25-75% 1.26/53% Overnight oximetry 07/05/16-qualified for sleep O2 ------------------------------------------------------------------------------------------------------------- 05/04/19- Televisit History of Present Illness: 60 year old female followed for sleep associated Hypoxemia,, complicated by cognitive dysfunction treated with Ritalin.,  after resection of Astrocytoma /XRT/ chemo, Seizure, Hyperlipiemia,  Brother is caregiver and contact for this visit. She was on the line briefly for "yes and no" answers to simple questions, saying she is comfortable with breathing and oxygen helps sleep at night. Brother concurred from his observation. Seizure in October, on Keppra, followed by Neurology.    Observations/Objective:   Assessment and Plan: Nocturnal Hypoxemia- benefits from O2 2L sleep Seizure Disorder after resection brain tumor- no recognized aspiration.   Follow Up Instructions: 1 year   08/18/20-60 year old female never smoker followed for sleep associated Hypoxemia,, complicated by cognitive dysfunction treated with Ritalin.,  after resection of Astrocytoma /XRT/ chemo, Seizure, Hyperlipiemia,  Brother is caregiver and contact for this visit. O2 2L sleep/ [employer] Covid vax- -----No complaints.   Denies any change in breathing or any acute event.Occ problem falling asleep. Brother sleeps in separate room but has monitor and says she breathes quietly.  ROS-see HPI    "+"  =  positive Constitutional:    weight loss, night sweats, fevers, chills, fatigue, lassitude. HEENT:    headaches, difficulty swallowing, tooth/dental problems, sore throat,       sneezing, itching, ear ache, nasal congestion, post nasal drip, snoring CV:    chest pain, orthopnea, PND, swelling in lower extremities, anasarca,                                                      dizziness, palpitations Resp:   shortness of breath with exertion or at rest.                productive cough,   non-productive cough, coughing up of blood.              change in color of mucus.  wheezing.   Skin:    rash or lesions. GI:  No-   heartburn, indigestion, abdominal pain, nausea, vomiting, diarrhea,                 change in bowel habits, loss of appetite GU: dysuria, change in color of urine, no urgency or frequency.   flank pain. MS:   joint pain, stiffness, decreased range of motion, back pain. Neuro-     HPI/ per Neurolgy Psych:  HPI  OBJ- Physical Exam General- Alert, Oriented, Affect-flat, Distress- none acute. +Wheelchair/ semi recumbent. + Obese Skin- rash-none, lesions- none, excoriation- none Lymphadenopathy- none Head- atraumatic            Eyes- Gross vision intact, PERRLA, conjunctivae and secretions clear            Ears- Hearing, canals-normal            Nose- Clear, no-Septal dev, mucus, polyps, erosion, perforation  Throat- Mallampati II , mucosa clear , drainage- none, tonsils- atrophic Neck- flexible , trachea midline, no stridor , thyroid nl, carotid no bruit Chest - symmetrical excursion , unlabored           Heart/CV- RRR , no murmur , no gallop  , no rub, nl s1 s2                           - JVD+ 1 , edema +1, stasis changes- none, varices- none           Lung- +clear/ unlabored, wheeze- none, cough- none , dullness-none, rub- none           Chest wall-  Abd-  Br/ Gen/ Rectal- Not done, not indicated Extrem- cyanosis- none, clubbing, none, atrophy- none, strength-  nl Neuro- +minimally interactive

## 2020-08-18 ENCOUNTER — Ambulatory Visit: Payer: Medicare HMO | Admitting: Internal Medicine

## 2020-08-18 ENCOUNTER — Encounter: Payer: Self-pay | Admitting: Internal Medicine

## 2020-08-18 ENCOUNTER — Other Ambulatory Visit: Payer: Self-pay

## 2020-08-18 DIAGNOSIS — G4734 Idiopathic sleep related nonobstructive alveolar hypoventilation: Secondary | ICD-10-CM | POA: Diagnosis not present

## 2020-08-18 DIAGNOSIS — G4701 Insomnia due to medical condition: Secondary | ICD-10-CM

## 2020-08-18 MED ORDER — ZALEPLON 5 MG PO CAPS: 5.0000 mg | ORAL_CAPSULE | Freq: Every evening | ORAL | 5 refills | Status: DC | PRN

## 2020-08-18 NOTE — Patient Instructions (Signed)
We can continue oxygen 2-3 liters for sleep and as needed  Script sent for Sonata 5 mg to take at bedtime for sleep if needed  Please call if we can help

## 2020-09-02 DIAGNOSIS — J9610 Chronic respiratory failure, unspecified whether with hypoxia or hypercapnia: Secondary | ICD-10-CM | POA: Diagnosis not present

## 2020-09-22 DIAGNOSIS — F330 Major depressive disorder, recurrent, mild: Secondary | ICD-10-CM | POA: Diagnosis not present

## 2020-09-22 DIAGNOSIS — R69 Illness, unspecified: Secondary | ICD-10-CM | POA: Diagnosis not present

## 2020-10-03 DIAGNOSIS — J9610 Chronic respiratory failure, unspecified whether with hypoxia or hypercapnia: Secondary | ICD-10-CM | POA: Diagnosis not present

## 2020-11-02 DIAGNOSIS — J9610 Chronic respiratory failure, unspecified whether with hypoxia or hypercapnia: Secondary | ICD-10-CM | POA: Diagnosis not present

## 2020-11-08 ENCOUNTER — Other Ambulatory Visit: Payer: Self-pay

## 2020-11-08 ENCOUNTER — Telehealth: Payer: Self-pay | Admitting: Neurology

## 2020-11-08 DIAGNOSIS — G40209 Localization-related (focal) (partial) symptomatic epilepsy and epileptic syndromes with complex partial seizures, not intractable, without status epilepticus: Secondary | ICD-10-CM

## 2020-11-08 DIAGNOSIS — Z85841 Personal history of malignant neoplasm of brain: Secondary | ICD-10-CM

## 2020-11-08 MED ORDER — LEVETIRACETAM 100 MG/ML PO SOLN: 1000.0000 mg | Freq: Two times a day (BID) | ORAL | 0 refills | Status: DC

## 2020-11-08 NOTE — Telephone Encounter (Signed)
Refill sent in for pt. 

## 2020-11-08 NOTE — Telephone Encounter (Signed)
Audrey Turner needs a refill on her keppra. She has 2-3 days left, her appt is in November.

## 2020-12-03 DIAGNOSIS — J9610 Chronic respiratory failure, unspecified whether with hypoxia or hypercapnia: Secondary | ICD-10-CM | POA: Diagnosis not present

## 2021-01-03 DIAGNOSIS — J9610 Chronic respiratory failure, unspecified whether with hypoxia or hypercapnia: Secondary | ICD-10-CM | POA: Diagnosis not present

## 2021-01-22 ENCOUNTER — Other Ambulatory Visit: Payer: Self-pay | Admitting: Neurology

## 2021-01-22 DIAGNOSIS — Z85841 Personal history of malignant neoplasm of brain: Secondary | ICD-10-CM

## 2021-01-22 DIAGNOSIS — G4700 Insomnia, unspecified: Secondary | ICD-10-CM | POA: Insufficient documentation

## 2021-01-22 DIAGNOSIS — G40209 Localization-related (focal) (partial) symptomatic epilepsy and epileptic syndromes with complex partial seizures, not intractable, without status epilepticus: Secondary | ICD-10-CM

## 2021-01-22 NOTE — Assessment & Plan Note (Signed)
Appears stable with supplemental O2. Presumed night time issue is obesity hypoventilation Plan- continue O2 2L for sleep

## 2021-01-22 NOTE — Assessment & Plan Note (Signed)
Lack of physical activity, hx brain tumor probably contribute. I think we can give some help with low dose sonata without unduly suppressing breathing. Plan- Sonata 5 mg

## 2021-02-02 DIAGNOSIS — J9610 Chronic respiratory failure, unspecified whether with hypoxia or hypercapnia: Secondary | ICD-10-CM | POA: Diagnosis not present

## 2021-02-13 ENCOUNTER — Other Ambulatory Visit: Payer: Self-pay

## 2021-02-13 ENCOUNTER — Encounter: Payer: Self-pay | Admitting: Neurology

## 2021-02-13 ENCOUNTER — Ambulatory Visit: Payer: Medicare HMO | Admitting: Neurology

## 2021-02-13 DIAGNOSIS — G40209 Localization-related (focal) (partial) symptomatic epilepsy and epileptic syndromes with complex partial seizures, not intractable, without status epilepticus: Secondary | ICD-10-CM | POA: Diagnosis not present

## 2021-02-13 DIAGNOSIS — Z85841 Personal history of malignant neoplasm of brain: Secondary | ICD-10-CM | POA: Diagnosis not present

## 2021-02-13 MED ORDER — LEVETIRACETAM 100 MG/ML PO SOLN: 1000.0000 mg | Freq: Two times a day (BID) | ORAL | 3 refills | Status: DC

## 2021-02-13 NOTE — Patient Instructions (Signed)
Good to see you. Continue all your medications. Please contact your neuro-oncologist to ask about repeating brain MRI. Follow-up in 1 year, call for any changes.  Seizure Precautions: 1. If medication has been prescribed for you to prevent seizures, take it exactly as directed.  Do not stop taking the medicine without talking to your doctor first, even if you have not had a seizure in a long time.   2. Avoid activities in which a seizure would cause danger to yourself or to others.  Don't operate dangerous machinery, swim alone, or climb in high or dangerous places, such as on ladders, roofs, or girders.  Do not drive unless your doctor says you may.  3. If you have any warning that you may have a seizure, lay down in a safe place where you can't hurt yourself.    4.  No driving for 6 months from last seizure, as per [facility].   Please refer to the following link on the [place] website for more information: http://www.epilepsyfoundation.org/answerplace/Social/driving/drivingu.cfm   5.  Maintain good sleep hygiene.  6.  Contact your doctor if you have any problems that may be related to the medicine you are taking.  7.  Call 911 and bring the patient back to the ED if:        A.  The seizure lasts longer than 5 minutes.       B.  The patient doesn't awaken shortly after the seizure  C.  The patient has new problems such as difficulty seeing, speaking or moving  D.  The patient was injured during the seizure  E.  The patient has a temperature over 102 F (39C)  F.  The patient vomited and now is having trouble breathing

## 2021-02-13 NOTE — Progress Notes (Signed)
NEUROLOGY FOLLOW UP OFFICE NOTE  Audrey Turner [id number] [birth year]  HISTORY OF PRESENT ILLNESS: I had the pleasure of seeing Audrey Turner in follow-up in the neurology clinic on 02/13/2021.  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 She is again accompanied by her brother Audrey Turner who helps supplement the history today.  Records and images were personally reviewed where available.  She continues to follow-up with her Neuropsychiatrist at [place], on Abilify, Pristiq, and Amantadine. Her brother denies any seizures since 12/2018. She is on Levetiracetam 1000mg  BID without significant side effects. He denies any loss of consciousness. She has started speaking in whispers, but he has heard her speak up and yell when she needs to. He states she goes through bouts of depression. She says she is sleeping okay, her brother agrees she does well for the most part, but every 1-2 weeks she has a hard time sleeping, thinking a lot. She was given Zaleplon for sleep, but he was concerned and has tried low dose melatonin that does work. She denies any pain, no headaches, dizziness. He feeds her, she can do it with her left hand but it takes too long. No falls.    HPI reviewed from Dr. Doristine Devoid note and per brother: This is a 61 year old right-handed woman with a history of left frontal astrocytoma s/p multiple resections.  Her first resection was in October, 2007, followed by a repeat in August, 2008.  She has been [facility] by [facility] for this. She is also being seen at [facility] by psychotherapy for frontal lobe syndrome/cognitive dysfunction. Her brother reports she did well for 5 years post-surgery, then for since 2014, she has been declining, to the point that he had to move in with her in July 2017. She was in in her usual state of health on 12/09/2015 when she and her brother went to the mattress store (had done PT before that).   Upon exiting  her vehicle her brother noted she suddenly stopped and could not move, then started having right arm jerking.   Her brother states that her left arm shook some but not like the right. She was helped to the ground.  Her eyes rolled back and lasted about 3 minutes.  EMS was contacted and once shaking stopped and waking up, she had trouble moving the right arm.  By the time she got to the hospital, the R arm was stronger and back to baseline.   . Patient has no prior history of seizure.  She was started on Keppra 500 mg twice a day. After her first seizure, she did not follow-up with PT, her brother reported she does not follow PT advice. She has become mostly wheelchair-bound. She is alone during the day but brother lives with her at night.  Brother states that she does nothing but watch TV during the day.  Seems to get confused/dazed by 8pm.  Has EDS.  Hx of OSA but refused CPAP years ago, she was Dr. Annamaria Boots who noted nocturnal hypoxemia. Brother also reported day/night reversal.   Treatment History via review of [facility] records:  01/10/06: Craniotomy and debulking performed at outside hospital revealing anaplastic astrocytoma. This was followed by Temozolomide and radiation and then six months of Temozolomide with last cycle of Temozolomide initiated on 08/10/06.  09/30/06: Radiographic evidence of new rim enhancing lesion butting off of the original resection cavity consistent with progressive disease.  10/15/06: New patient evaluation  at [facility] [place]. 10/17/06: Craniotomy with resection of tumor with Dr. Derrill Memo, and pathology confirming anaplastic astrocytoma.  11/19/06: Patient initiated on Zactima, Imatinib and Hydroxyurea per protocol.  01/20/07: Patient has completed two cycles on protocol utilizing Zactima, Gleevec, and Hydrea.  02/24/07: Patient is now status post three cycles of Zactima/Gleevec/Hydrea on protocol. She has had a dose reduction for cycle 3  with Zactima at 100 mg p.o. DAILY and Gleevec 300 mg p.o. DAILY due to problems with diarrhea.  03/24/07: Patient is status post four cycles of therapy on protocol with Zactima, Gleevec and Hydrea.  05/19/07: Patient is status post five cycles of Zactima, Gleevec, and Hydroxyurea on protocol; last dose on 04/18/07 and has been off therapy since that time secondary to requiring surgery on her craniotomy site. We will await recommendations from Dr. Beverely Low in Plastic Surgery prior to continuing with therapy. She is clinically and radiographically stable at this time.  09/22/07: Patient is status post four cycles of VP-16 x 21 days per 28-day cycle.  05/26/08: Status post twelve cycles of Etoposide; off therapy.   Diagnostic Data: 24-hour EEG which captured 2 episodes of being dazed, no EEG changes seen. EEG showed focal left hemisphere slowing, no epileptiform discharges. 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 There was left frontal lobe gliosis surrounding the resection cavity, additional patchy white matter changes. There were subcentimeter T2 bright cysts in the left corona radiata and right parietal lobe, favoring post-treatment changes.    PAST MEDICAL HISTORY: Past Medical History:  Diagnosis Date   Acute sinusitis, unspecified    Astrocytoma ([place]) 01/2006 /09/2006   grade 3 atrocytoma s/p chemo/radiation/surgery at [facility], in remission since at least 2011   Cancer [facility])    Brain    Diarrhea    Drug induced neutropenia(288.03)    Primary exertional headache    Recurrent depression ([place])    Vaginitis     MEDICATIONS: Current Outpatient Medications on File Prior to Visit  Medication Sig Dispense Refill   Amantadine HCl 100 MG tablet Take 1 tab qam and 1 tab qnoon      ARIPiprazole (ABILIFY) 5 MG tablet Take 5 mg by mouth daily.     desvenlafaxine (PRISTIQ) 50 MG 24 hr tablet Take 50 mg by mouth daily.     levETIRAcetam (KEPPRA) 100 MG/ML solution TAKE 10 MLS (1,000 MG TOTAL) BY MOUTH 2 (TWO) TIMES DAILY. SHAKE WELL PRIOR TO ADMINISTERING. 1419 mL 1   MELATONIN KIDS PO Take by mouth.     Multiple Vitamin (MULTIVITAMIN) capsule Take 1 capsule by mouth daily.     zaleplon (SONATA) 5 MG capsule Take 1 capsule (5 mg total) by mouth at bedtime as needed for sleep. (Patient not taking: Reported on 02/13/2021) 30 capsule 5   No current facility-administered medications on file prior to visit.    ALLERGIES: Allergies  Allergen Reactions   Dilantin [Phenytoin Sodium Extended] Rash   Penicillins Rash    FAMILY HISTORY: Family History  Problem Relation Age of Onset   Hypertension Mother    Hypertension Father    COPD Father    Sleep apnea Father    Breast cancer Paternal Aunt        69's   Brain cancer Paternal Aunt  Ovarian cancer Maternal Aunt    Colon cancer Neg Hx     SOCIAL HISTORY: Social History   Socioeconomic History   Marital status: Divorced    Spouse name: Not on file   Number of children: 3   Years of education: Not on file   Highest education level: Not on file  Occupational History   Occupation: stay at home mother  Tobacco Use   Smoking status: Never   Smokeless tobacco: Never  Vaping Use   Vaping Use: Never used  Substance and Sexual Activity   Alcohol use: No   Drug use: No   Sexual activity: Never    Birth control/protection: None  Other Topics Concern   Not on file  Social History Narrative   Divorced   From [place], [place]   Lives with brother.     Single story house, with ramp.     Social Determinants of Health   Financial Resource Strain: Not on file  Food Insecurity: Not on file  Transportation Needs: Not on file  Physical Activity: Not on file  Stress: Not on file  Social Connections: Not on file   Intimate Partner Violence: Not on file     PHYSICAL EXAM: Vitals:   02/13/21 1032  BP: (!) 142/80  Pulse: (!) 50  SpO2: 98%   General: No acute distress, poor eye contact Head:  Normocephalic/atraumatic Skin/Extremities: No rash, no edema Neurological Exam: alert and awake. She smiles slightly but otherwise has flat affect and answers in whispers. Cranial nerves: Pupils equal, round. Extraocular movements intact with no nystagmus. Visual fields full.  No facial asymmetry.  Motor: spastic contracture on right UE, 0/5 right UE, 3/5 proximal left UE, 5/5 distal left UE. 2/5 right LE, 3-/5 left LE. Gait not tested, sitting on wheelchair.   IMPRESSION: This is a pleasant 60 yo woman with a history of left frontal astrocytoma s/p resection x 2, chemotherapy and radiation, with frontal lobe syndrome with cognitive impairment and subsequent seizures with right-sided shaking and post-ictal Todd's paralysis in 2017. She had a transient episode of unresponsiveness last 12/2018 felt due to seizure. MRI brain no acute changes, stable from 2017.  She has been event-free since 12/2018 on Levetiracetam 1000mg  BID, refills sent. She continues to follow-up at [facility] and will contact them regarding when to repeat brain MRI. Continue follow-up with Psychiatry. Continue 24/7 care. Follow-up in 1 year, they know to call for any changes.   Thank you for allowing me to participate in her care.  Please do not hesitate to call for any questions or concerns.   Ellouise Newer, M.D.   CC: Dr. Damita Dunnings

## 2021-03-05 DIAGNOSIS — J9610 Chronic respiratory failure, unspecified whether with hypoxia or hypercapnia: Secondary | ICD-10-CM | POA: Diagnosis not present

## 2021-03-09 DIAGNOSIS — K219 Gastro-esophageal reflux disease without esophagitis: Secondary | ICD-10-CM | POA: Diagnosis not present

## 2021-03-09 DIAGNOSIS — Z8249 Family history of ischemic heart disease and other diseases of the circulatory system: Secondary | ICD-10-CM | POA: Diagnosis not present

## 2021-03-09 DIAGNOSIS — N3941 Urge incontinence: Secondary | ICD-10-CM | POA: Diagnosis not present

## 2021-03-09 DIAGNOSIS — R69 Illness, unspecified: Secondary | ICD-10-CM | POA: Diagnosis not present

## 2021-03-09 DIAGNOSIS — J9610 Chronic respiratory failure, unspecified whether with hypoxia or hypercapnia: Secondary | ICD-10-CM | POA: Diagnosis not present

## 2021-03-09 DIAGNOSIS — G4700 Insomnia, unspecified: Secondary | ICD-10-CM | POA: Diagnosis not present

## 2021-03-09 DIAGNOSIS — K5900 Constipation, unspecified: Secondary | ICD-10-CM | POA: Diagnosis not present

## 2021-03-09 DIAGNOSIS — E669 Obesity, unspecified: Secondary | ICD-10-CM | POA: Diagnosis not present

## 2021-03-09 DIAGNOSIS — G40909 Epilepsy, unspecified, not intractable, without status epilepticus: Secondary | ICD-10-CM | POA: Diagnosis not present

## 2021-03-09 DIAGNOSIS — R251 Tremor, unspecified: Secondary | ICD-10-CM | POA: Diagnosis not present

## 2021-03-09 DIAGNOSIS — F419 Anxiety disorder, unspecified: Secondary | ICD-10-CM | POA: Diagnosis not present

## 2021-03-09 DIAGNOSIS — G8111 Spastic hemiplegia affecting right dominant side: Secondary | ICD-10-CM | POA: Diagnosis not present

## 2021-03-09 DIAGNOSIS — F324 Major depressive disorder, single episode, in partial remission: Secondary | ICD-10-CM | POA: Diagnosis not present

## 2021-04-04 DIAGNOSIS — J9610 Chronic respiratory failure, unspecified whether with hypoxia or hypercapnia: Secondary | ICD-10-CM | POA: Diagnosis not present

## 2021-05-05 DIAGNOSIS — J9610 Chronic respiratory failure, unspecified whether with hypoxia or hypercapnia: Secondary | ICD-10-CM | POA: Diagnosis not present

## 2021-05-20 ENCOUNTER — Other Ambulatory Visit: Payer: Self-pay | Admitting: Neurology

## 2021-05-20 DIAGNOSIS — Z85841 Personal history of malignant neoplasm of brain: Secondary | ICD-10-CM

## 2021-05-20 DIAGNOSIS — G40209 Localization-related (focal) (partial) symptomatic epilepsy and epileptic syndromes with complex partial seizures, not intractable, without status epilepticus: Secondary | ICD-10-CM

## 2021-05-22 NOTE — Progress Notes (Signed)
Subjective:   Audrey Turner is a 61 y.o. female who presents for Medicare Annual (Subsequent) preventive examination.  I connected with Dione Housekeeper today by telephone and verified that I am speaking with the correct person using two identifiers. Location patient: home Location provider: work Persons participating in the virtual visit: patient, brother [name], Marine scientist.    I discussed the limitations, risks, security and privacy concerns of performing an evaluation and management service by telephone and the availability of in person appointments. I also discussed with the patient that there may be a patient responsible charge related to this service. The patient expressed understanding and verbally consented to this telephonic visit.    Interactive audio and video telecommunications were attempted between this provider and patient, however failed, due to patient having technical difficulties OR patient did not have access to video capability.  We continued and completed visit with audio only.  Some vital signs may be absent or patient reported.   Time Spent with patient on telephone encounter: 25 minutes   Review of Systems     Cardiac Risk Factors include: advanced age (>50men, >62 women);dyslipidemia     Objective:    Today's Vitals   05/23/21 1159  Weight: 170 lb (77.1 kg)  Height: 5' (1.524 m)   Body mass index is 33.2 kg/m.  Advanced Directives 05/23/2021 02/13/2021 08/18/2019 06/15/2019 01/06/2019 05/03/2017 02/26/2016  Does Patient Have a Medical Advance Directive? Yes Yes Yes Yes Yes Yes No  Type of Paramedic of [place];Living will - [place];Living will [place];Living will Living will [place];Living will -  Does patient want to make changes to medical advance directive? Yes (MAU/Ambulatory/Procedural Areas - Information given) - - - No - Patient declined - -   Copy of [place] in Chart? - - No - copy requested - - No - copy requested -  Would patient like information on creating a medical advance directive? - - - - - - Yes - Educational materials given    Current Medications (verified) Outpatient Encounter Medications as of 05/23/2021  Medication Sig   Amantadine HCl 100 MG tablet Take 1 tab qam and 1 tab qnoon   ARIPiprazole (ABILIFY) 5 MG tablet Take 5 mg by mouth daily.   desvenlafaxine (PRISTIQ) 50 MG 24 hr tablet Take 50 mg by mouth daily.   levETIRAcetam (KEPPRA) 100 MG/ML solution TAKE 10 MLS (1,000 MG TOTAL) BY MOUTH 2 (TWO) TIMES DAILY. SHAKE WELL PRIOR TO ADMINISTERING.   MELATONIN KIDS PO Take by mouth.   Multiple Vitamin (MULTIVITAMIN) capsule Take 1 capsule by mouth daily.   zaleplon (SONATA) 5 MG capsule Take 1 capsule (5 mg total) by mouth at bedtime as needed for sleep. (Patient not taking: Reported on 02/13/2021)   No facility-administered encounter medications on file as of 05/23/2021.    Allergies (verified) Dilantin [phenytoin sodium extended] and Penicillins   History: Past Medical History:  Diagnosis Date   Acute sinusitis, unspecified    Astrocytoma ([place]) 01/2006 /09/2006   grade 3 atrocytoma s/p chemo/radiation/surgery at [facility], in remission since at least 2011   Cancer [facility])    Brain    Diarrhea    Drug induced neutropenia(288.03)    Primary exertional headache    Recurrent depression ([place])    Vaginitis    Past Surgical History:  Procedure Laterality Date   [facility].   d&c miscarriage  1993   left frontal craniotomy w/ left subtotal resection of left frontal brain tumor  01/10/2006   [facility] brain tumor surgery  01/13/2006   NSVD x 3 - premie at 22 wks     resection residual tumor  10/08/2006   Dr. Tommi Rumps: [facility]   skull surg  2009/2010/2012   3 times past brain surg to close up opening   Family History  Problem Relation Age of Onset   Hypertension Mother     Hypertension Father    COPD Father    Sleep apnea Father    Breast cancer Paternal Aunt        61's   Brain cancer Paternal Aunt    Ovarian cancer Maternal Aunt    Colon cancer Neg Hx    Social History   Socioeconomic History   Marital status: Divorced    Spouse name: Not on file   Number of children: 3   Years of education: Not on file   Highest education level: Not on file  Occupational History   Occupation: stay at home mother  Tobacco Use   Smoking status: Never   Smokeless tobacco: Never  Vaping Use   Vaping Use: Never used  Substance and Sexual Activity   Alcohol use: No   Drug use: No   Sexual activity: Never    Birth control/protection: None  Other Topics Concern   Not on file  Social History Narrative   Divorced   From [place], [place]   Lives with brother.     Single story house, with ramp.     Social Determinants of Health   Financial Resource Strain: Low Risk    Difficulty of Paying Living Expenses: Not hard at all  Food Insecurity: No Food Insecurity   Worried About Charity fundraiser in the Last Year: Never true   [place] in the Last Year: Never true  Transportation Needs: No Transportation Needs   Lack of Transportation (Medical): No   Lack of Transportation (Non-Medical): No  Physical Activity: Inactive   Days of Exercise per Week: 0 days   Minutes of Exercise per Session: 0 min  Stress: No Stress Concern Present   Feeling of Stress : Not at all  Social Connections: Moderately Isolated   Frequency of Communication with Friends and Family: More than three times a week   Frequency of Social Gatherings with Friends and Family: Twice a week   Attends Religious Services: More than 4 times per year   Active Member of [employer] or Organizations: No   Attends Music therapist: Never   Marital Status: Divorced    Tobacco Counseling Counseling given: Not Answered   Clinical Intake:  Pre-visit preparation completed: Yes  Pain :  No/denies pain     BMI - recorded: 33.2 Nutritional Status: BMI > 30  Obese Nutritional Risks: None Diabetes: No  How often do you need to have someone help you when you read instructions, pamphlets, or other written materials from your doctor or pharmacy?: 1 - Never  Diabetic? No  Interpreter Needed?: No  Information entered by :: Orrin Brigham LPN   Activities of Daily Living In your present state of health, do you have any difficulty performing the following activities: 05/23/2021  Hearing? N  Vision? N  Difficulty concentrating or making decisions? N  Comment some short term memory loss- Patient sees neurologist  Walking or climbing stairs? Y  Dressing or bathing? N  Doing errands, shopping? Darreld Mclean  Comment [place] and eating ? N  Using the Toilet? N  In the past six months, have you accidently leaked urine? Y  Comment wears pads  Do you have problems with loss of bowel control? N  Managing your Medications? Y  Comment Brother assist  Managing your Finances? Y  Comment Brother Writer or managing your Housekeeping? Y  Comment Brother assist  Some recent data might be hidden    Patient Care Team: Tonia Ghent, MD as PCP - General (Family Medicine) Ranell Patrick, MD as Referring Physician (Psychiatry) Thelma Comp, Alsey as Referring Physician (Optometry) Encarnacion Slates, MD as Referring Physician (Neurology) Phineas Real, Belinda Block, MD (Inactive) as Consulting Physician (Gynecology) Henreitta Leber, DDS as Referring Physician (Dentistry) Cameron Sprang, MD as Consulting Physician (Neurology)  Indicate any recent Medical Services you may have received from other than [facility] providers in the past year (date may be approximate).     Assessment:   This is a routine wellness examination for [place].  Hearing/Vision screen Hearing Screening - Comments:: No issues Vision Screening - Comments:: Last exam 2021, [facility]  Dietary issues and exercise activities discussed: Current Exercise Habits: The patient does not participate in regular exercise at present   Goals Addressed             This Visit's Progress    Patient Stated       Would like to exercise more        Depression Screen PHQ 2/9 Scores 05/23/2021 08/18/2019 05/03/2017  PHQ - 2 Score 1 6 3   PHQ- 9 Score - 6 9    Fall Risk Fall Risk  05/23/2021 02/13/2021 08/18/2019 06/15/2019 02/28/2018  Falls in the past year? 1 0 0 0 0  Comment - - - - -  Number falls in past yr: 0 0 0 0 0  Injury with Fall? 0 0 0 0 0  Risk Factor Category  - - - - -  Risk for fall due to : - - Medication side effect;Impaired mobility Impaired mobility -  Follow up Falls prevention discussed - Falls evaluation completed;Falls prevention discussed - -    FALL RISK PREVENTION PERTAINING TO THE HOME:  Any stairs in or around the home? Yes  If so, are there any without handrails? No  Home free of loose throw rugs in walkways, pet beds, electrical cords, etc? Yes  Adequate lighting in your home to reduce risk of falls? Yes   ASSISTIVE DEVICES UTILIZED TO PREVENT FALLS:  Life alert? No  Use of a cane, walker or w/c? Yes , walker, wheel chair Grab bars in the bathroom? Yes  Shower chair or bench in shower? Yes  Elevated toilet seat or a handicapped toilet? Yes   TIMED UP AND GO:  Was the test performed? No .    Cognitive Function: Normal cognitive status assessed by this Nurse Health Advisor. No abnormalities found.   MMSE - Mini Mental State Exam 08/18/2019 05/03/2017  Not completed: Unable to complete -  Orientation to time - 5  Orientation to Place - 5  Registration - 3  Attention/ Calculation - 0  Recall - 1  Recall-comments - unable to recall 2 of 3 words  Language- name 2 objects - 0  Language- repeat - 1  Language- follow 3 step command - 3  Language- read & follow direction - 0  Write a sentence - 0  Copy design - 0  Total score -  18         Immunizations Immunization History  Administered Date(s) Administered   Influenza,inj,Quad PF,6+ Mos 03/12/2013, 05/10/2017   Influenza-Unspecified 04/03/2013, 05/01/2018   PFIZER(Purple Top)SARS-COV-2 Vaccination 06/21/2019, 07/16/2019   Td 09/17/2002   Tdap 03/12/2013   Zoster Recombinat (Shingrix) 06/02/2019, 08/31/2019    TDAP status: Up to date  Flu Vaccine status: Due, Education has been provided regarding the importance of this vaccine. Advised may receive this vaccine at local pharmacy or Health Dept. Aware to provide a copy of the vaccination record if obtained from local pharmacy or Health Dept. Verbalized acceptance and understanding.  Pneumococcal Vaccine: Not yet eligible  Covid-19 vaccine status: Information provided on how to obtain vaccines.   Qualifies for Shingles Vaccine? Yes   Zostavax completed No   Shingrix Completed?: Yes  Screening Tests Health Maintenance  Topic Date Due   PAP SMEAR-Modifier  02/27/2016   COVID-19 Vaccine (3 - Pfizer risk series) 08/13/2019   INFLUENZA VACCINE  11/07/2020   MAMMOGRAM  09/08/2021   TETANUS/TDAP  03/13/2023   COLONOSCOPY (Pts 45-51yrs Insurance coverage will need to be confirmed)  06/03/2023   Hepatitis C Screening  Completed   HIV Screening  Completed   Zoster Vaccines- Shingrix  Completed   HPV VACCINES  Aged Out    Health Maintenance  Health Maintenance Due  Topic Date Due   PAP SMEAR-Modifier  02/27/2016   COVID-19 Vaccine (3 - Pfizer risk series) 08/13/2019   INFLUENZA VACCINE  11/07/2020    Colorectal cancer screening: Type of screening: Colonoscopy. Completed 06/02/13. Repeat every 10 years  Mammogram status: Ordered 05/23/21. Pt provided with contact info and advised to call to schedule appt.   Bone Density status: Not yet eligible   Lung Cancer Screening: (Low Dose CT Chest recommended if Age 74-80 years, 30 pack-year currently smoking OR have quit w/in 15years.) does not qualify.      Additional Screening:  Hepatitis C Screening: does qualify  Vision Screening: Recommended annual ophthalmology exams for early detection of glaucoma and other disorders of the eye. Is the patient up to date with their annual eye exam?  No  Who is the provider or what is the name of the office in which the patient attends annual eye exams? [facility]   Dental Screening: Recommended annual dental exams for proper oral hygiene  Community Resource Referral / Chronic Care Management: CRR required this visit?  No   CCM required this visit?  No      Plan:     I have personally reviewed and noted the following in the patients chart:   Medical and social history Use of alcohol, tobacco or illicit drugs  Current medications and supplements including opioid prescriptions.  Functional ability and status Nutritional status Physical activity Advanced directives List of other physicians Hospitalizations, surgeries, and ER visits in previous 12 months Vitals Screenings to include cognitive, depression, and falls Referrals and appointments  In addition, I have reviewed and discussed with patient certain preventive protocols, quality metrics, and best practice recommendations. A written personalized care plan for preventive services as well as general preventive health recommendations were provided to patient.   Due to this being a telephonic visit, the after visit summary with patients personalized plan was offered to patient via mail or my-chart.  Patient would like to access on my-chart.   Loma Messing, LPN   3/50/0938   Nurse Health Advisor  Nurse Notes: none

## 2021-05-23 ENCOUNTER — Ambulatory Visit (INDEPENDENT_AMBULATORY_CARE_PROVIDER_SITE_OTHER): Payer: Medicare HMO

## 2021-05-23 VITALS — Ht 60.0 in | Wt 170.0 lb

## 2021-05-23 DIAGNOSIS — Z1231 Encounter for screening mammogram for malignant neoplasm of breast: Secondary | ICD-10-CM

## 2021-05-23 DIAGNOSIS — Z0000 Encounter for general adult medical examination without abnormal findings: Secondary | ICD-10-CM | POA: Diagnosis not present

## 2021-05-23 NOTE — Patient Instructions (Signed)
Audrey Turner , Thank you for taking time to complete your Medicare Wellness Visit. I appreciate your ongoing commitment to your health goals. Please review the following plan we discussed and let me know if I can assist you in the future.   Screening recommendations/referrals: Colonoscopy: up to date, completed 06/02/13, due 06/03/23 Mammogram: due, last completed 09/09/19, ordered today someone will call to schedule an appointment Bone Density: not yet eligible  Recommended yearly ophthalmology/optometry visit for glaucoma screening and checkup Recommended yearly dental visit for hygiene and checkup  Vaccinations: Influenza vaccine: Due-May obtain vaccine at our office or your local pharmacy. Pneumococcal vaccine: not yet eligible for Prevnar 13 and Pneumovax 23 ( 65+)  Tdap vaccine: up to date Shingles vaccine: up to date   Covid-19: newest booster available at your local pharmacy   Advanced directives: Please bring a copy of Living Will and/or [place] for your chart.   Conditions/risks identified: see problem list   Next appointment: Follow up in one year for your annual wellness visit. 05/24/22 @ 12:00pm, this will be a telephone visit   Preventive Care 40-64 Years, Female Preventive care refers to lifestyle choices and visits with your health care provider that can promote health and wellness. What does preventive care include? A yearly physical exam. This is also called an annual well check. Dental exams once or twice a year. Routine eye exams. Ask your health care provider how often you should have your eyes checked. Personal lifestyle choices, including: Daily care of your teeth and gums. Regular physical activity. Eating a healthy diet. Avoiding tobacco and drug use. Limiting alcohol use. Practicing safe sex. Taking low-dose aspirin daily starting at age 13. Taking vitamin and mineral supplements as recommended by your health care provider. What happens  during an annual well check? The services and screenings done by your health care provider during your annual well check will depend on your age, overall health, lifestyle risk factors, and family history of disease. Counseling  Your health care provider may ask you questions about your: Alcohol use. Tobacco use. Drug use. Emotional well-being. Home and relationship well-being. Sexual activity. Eating habits. Work and work Statistician. Method of birth control. Menstrual cycle. Pregnancy history. Screening  You may have the following tests or measurements: Height, weight, and BMI. Blood pressure. Lipid and cholesterol levels. These may be checked every 5 years, or more frequently if you are over 21 years old. Skin check. Lung cancer screening. You may have this screening every year starting at age 16 if you have a 30-pack-year history of smoking and currently smoke or have quit within the past 15 years. Fecal occult blood test (FOBT) of the stool. You may have this test every year starting at age 71. Flexible sigmoidoscopy or colonoscopy. You may have a sigmoidoscopy every 5 years or a colonoscopy every 10 years starting at age 69. Hepatitis C blood test. Hepatitis B blood test. Sexually transmitted disease (STD) testing. Diabetes screening. This is done by checking your blood sugar (glucose) after you have not eaten for a while (fasting). You may have this done every 1-3 years. Mammogram. This may be done every 1-2 years. Talk to your health care provider about when you should start having regular mammograms. This may depend on whether you have a family history of breast cancer. BRCA-related cancer screening. This may be done if you have a family history of breast, ovarian, tubal, or peritoneal cancers. Pelvic exam and Pap test. This may be done every  3 years starting at age 2. Starting at age 63, this may be done every 5 years if you have a Pap test in combination with an HPV  test. Bone density scan. This is done to screen for osteoporosis. You may have this scan if you are at high risk for osteoporosis. Discuss your test results, treatment options, and if necessary, the need for more tests with your health care provider. Vaccines  Your health care provider may recommend certain vaccines, such as: Influenza vaccine. This is recommended every year. Tetanus, diphtheria, and acellular pertussis (Tdap, Td) vaccine. You may need a Td booster every 10 years. Zoster vaccine. You may need this after age 52. Pneumococcal 13-valent conjugate (PCV13) vaccine. You may need this if you have certain conditions and were not previously vaccinated. Pneumococcal polysaccharide (PPSV23) vaccine. You may need one or two doses if you smoke cigarettes or if you have certain conditions. Talk to your health care provider about which screenings and vaccines you need and how often you need them. This information is not intended to replace advice given to you by your health care provider. Make sure you discuss any questions you have with your health care provider. Document Released: 04/22/2015 Document Revised: 12/14/2015 Document Reviewed: 01/25/2015 Elsevier Interactive Patient Education  2017 [place] Prevention in the Home Falls can cause injuries. They can happen to people of all ages. There are many things you can do to make your home safe and to help prevent falls. What can I do on the outside of my home? Regularly fix the edges of walkways and driveways and fix any cracks. Remove anything that might make you trip as you walk through a door, such as a raised step or threshold. Trim any bushes or trees on the path to your home. Use bright outdoor lighting. Clear any walking paths of anything that might make someone trip, such as rocks or tools. Regularly check to see if handrails are loose or broken. Make sure that both sides of any steps have handrails. Any raised  decks and porches should have guardrails on the edges. Have any leaves, snow, or ice cleared regularly. Use sand or salt on walking paths during winter. Clean up any spills in your garage right away. This includes oil or grease spills. What can I do in the bathroom? Use night lights. Install grab bars by the toilet and in the tub and shower. Do not use towel bars as grab bars. Use non-skid mats or decals in the tub or shower. If you need to sit down in the shower, use a plastic, non-slip stool. Keep the floor dry. Clean up any water that spills on the floor as soon as it happens. Remove soap buildup in the tub or shower regularly. Attach bath mats securely with double-sided non-slip rug tape. Do not have throw rugs and other things on the floor that can make you trip. What can I do in the bedroom? Use night lights. Make sure that you have a light by your bed that is easy to reach. Do not use any sheets or blankets that are too big for your bed. They should not hang down onto the floor. Have a firm chair that has side arms. You can use this for support while you get dressed. Do not have throw rugs and other things on the floor that can make you trip. What can I do in the kitchen? Clean up any spills right away. Avoid walking on wet  floors. Keep items that you use a lot in easy-to-reach places. If you need to reach something above you, use a strong step stool that has a grab bar. Keep electrical cords out of the way. Do not use floor polish or wax that makes floors slippery. If you must use wax, use non-skid floor wax. Do not have throw rugs and other things on the floor that can make you trip. What can I do with my stairs? Do not leave any items on the stairs. Make sure that there are handrails on both sides of the stairs and use them. Fix handrails that are broken or loose. Make sure that handrails are as long as the stairways. Check any carpeting to make sure that it is firmly attached  to the stairs. Fix any carpet that is loose or worn. Avoid having throw rugs at the top or bottom of the stairs. If you do have throw rugs, attach them to the floor with carpet tape. Make sure that you have a light switch at the top of the stairs and the bottom of the stairs. If you do not have them, ask someone to add them for you. What else can I do to help prevent falls? Wear shoes that: Do not have high heels. Have rubber bottoms. Are comfortable and fit you well. Are closed at the toe. Do not wear sandals. If you use a stepladder: Make sure that it is fully opened. Do not climb a closed stepladder. Make sure that both sides of the stepladder are locked into place. Ask someone to hold it for you, if possible. Clearly mark and make sure that you can see: Any grab bars or handrails. First and last steps. Where the edge of each step is. Use tools that help you move around (mobility aids) if they are needed. These include: Canes. Walkers. Scooters. Crutches. Turn on the lights when you go into a dark area. Replace any light bulbs as soon as they burn out. Set up your furniture so you have a clear path. Avoid moving your furniture around. If any of your floors are uneven, fix them. If there are any pets around you, be aware of where they are. Review your medicines with your doctor. Some medicines can make you feel dizzy. This can increase your chance of falling. Ask your doctor what other things that you can do to help prevent falls. This information is not intended to replace advice given to you by your health care provider. Make sure you discuss any questions you have with your health care provider. Document Released: 01/20/2009 Document Revised: 09/01/2015 Document Reviewed: 04/30/2014 Elsevier Interactive Patient Education  2017 [employer].

## 2021-05-28 ENCOUNTER — Other Ambulatory Visit: Payer: Self-pay | Admitting: Family Medicine

## 2021-05-28 DIAGNOSIS — E785 Hyperlipidemia, unspecified: Secondary | ICD-10-CM

## 2021-05-28 DIAGNOSIS — R739 Hyperglycemia, unspecified: Secondary | ICD-10-CM

## 2021-05-30 ENCOUNTER — Other Ambulatory Visit (INDEPENDENT_AMBULATORY_CARE_PROVIDER_SITE_OTHER): Payer: Medicare HMO

## 2021-05-30 ENCOUNTER — Other Ambulatory Visit: Payer: Self-pay

## 2021-05-30 DIAGNOSIS — R739 Hyperglycemia, unspecified: Secondary | ICD-10-CM | POA: Diagnosis not present

## 2021-05-30 DIAGNOSIS — E785 Hyperlipidemia, unspecified: Secondary | ICD-10-CM

## 2021-05-30 LAB — HEMOGLOBIN A1C: Hgb A1c MFr Bld: 4.9 % (ref 4.6–6.5)

## 2021-05-30 LAB — CBC WITH DIFFERENTIAL/PLATELET
Basophils Absolute: 0 10*3/uL (ref 0.0–0.1)
Basophils Relative: 0.7 % (ref 0.0–3.0)
Eosinophils Absolute: 0.1 10*3/uL (ref 0.0–0.7)
Eosinophils Relative: 1.5 % (ref 0.0–5.0)
HCT: 41.9 % (ref 36.0–46.0)
Hemoglobin: 13.4 g/dL (ref 12.0–15.0)
Lymphocytes Relative: 45.2 % (ref 12.0–46.0)
Lymphs Abs: 2.7 10*3/uL (ref 0.7–4.0)
MCHC: 32 g/dL (ref 30.0–36.0)
MCV: 81.3 fl (ref 78.0–100.0)
Monocytes Absolute: 0.3 10*3/uL (ref 0.1–1.0)
Monocytes Relative: 5.7 % (ref 3.0–12.0)
Neutro Abs: 2.8 10*3/uL (ref 1.4–7.7)
Neutrophils Relative %: 46.9 % (ref 43.0–77.0)
Platelets: 254 10*3/uL (ref 150.0–400.0)
RBC: 5.15 Mil/uL — ABNORMAL HIGH (ref 3.87–5.11)
RDW: 13.9 % (ref 11.5–15.5)
WBC: 5.9 10*3/uL (ref 4.0–10.5)

## 2021-05-30 LAB — COMPREHENSIVE METABOLIC PANEL
ALT: 25 U/L (ref 0–35)
AST: 31 U/L (ref 0–37)
Albumin: 3.8 g/dL (ref 3.5–5.2)
Alkaline Phosphatase: 74 U/L (ref 39–117)
BUN: 13 mg/dL (ref 6–23)
CO2: 34 mEq/L — ABNORMAL HIGH (ref 19–32)
Calcium: 9.1 mg/dL (ref 8.4–10.5)
Chloride: 104 mEq/L (ref 96–112)
Creatinine, Ser: 0.6 mg/dL (ref 0.40–1.20)
GFR: 98.88 mL/min (ref >60.00)
Glucose, Bld: 84 mg/dL (ref 70–99)
Potassium: 4.3 mEq/L (ref 3.5–5.1)
Sodium: 141 mEq/L (ref 135–145)
Total Bilirubin: 0.4 mg/dL (ref 0.2–1.2)
Total Protein: 6.7 g/dL (ref 6.0–8.3)

## 2021-05-30 LAB — LIPID PANEL
Cholesterol: 169 mg/dL (ref 0–200)
HDL: 38.7 mg/dL — ABNORMAL LOW (ref >39.00)
LDL Cholesterol: 94 mg/dL (ref 0–99)
NonHDL: 130.52
Total CHOL/HDL Ratio: 4
Triglycerides: 181 mg/dL — ABNORMAL HIGH (ref 0.0–149.0)
VLDL: 36.2 mg/dL (ref 0.0–40.0)

## 2021-06-05 DIAGNOSIS — J9610 Chronic respiratory failure, unspecified whether with hypoxia or hypercapnia: Secondary | ICD-10-CM | POA: Diagnosis not present

## 2021-06-06 ENCOUNTER — Encounter: Payer: Self-pay | Admitting: Family Medicine

## 2021-06-06 ENCOUNTER — Other Ambulatory Visit: Payer: Self-pay

## 2021-06-06 ENCOUNTER — Ambulatory Visit (INDEPENDENT_AMBULATORY_CARE_PROVIDER_SITE_OTHER): Payer: Medicare HMO | Admitting: Family Medicine

## 2021-06-06 VITALS — BP 116/72 | HR 78 | Temp 98.4°F | Ht 60.0 in | Wt 165.0 lb

## 2021-06-06 DIAGNOSIS — G4734 Idiopathic sleep related nonobstructive alveolar hypoventilation: Secondary | ICD-10-CM | POA: Diagnosis not present

## 2021-06-06 DIAGNOSIS — C711 Malignant neoplasm of frontal lobe: Secondary | ICD-10-CM | POA: Diagnosis not present

## 2021-06-06 DIAGNOSIS — R209 Unspecified disturbances of skin sensation: Secondary | ICD-10-CM | POA: Diagnosis not present

## 2021-06-06 DIAGNOSIS — G4701 Insomnia due to medical condition: Secondary | ICD-10-CM

## 2021-06-06 DIAGNOSIS — M24549 Contracture, unspecified hand: Secondary | ICD-10-CM

## 2021-06-06 DIAGNOSIS — R569 Unspecified convulsions: Secondary | ICD-10-CM | POA: Diagnosis not present

## 2021-06-06 NOTE — Progress Notes (Signed)
This visit occurred during the SARS-CoV-2 public health emergency.  Safety protocols were in place, including screening questions prior to the visit, additional usage of staff PPE, and extensive cleaning of exam room while observing appropriate contact time as indicated for disinfecting solutions.  She is off sonata in the meantime, didn't use prev.  Has used prn melatonin, every few weeks, if needed for insomnia.  Melatonin has been helpful and effective when needed. Still using O2 at night, only at night.  Her bother checks her orientation daily.    She is a grandmother as of 04/18/2021.  Her grandson was born this year.  Congratulations offered.  History of seizure.  Compliant with Keppra.  No SZ in the meantime.  Compliant.  Living at home with brother.   She was asking if she needed a follow-up MRI and I can check with the [facility] about that.  See following phone note.  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  She needs help getting a wheelchair for home use.  See following phone note.  She still has right hand contracture at baseline.  Discussed getting OT set up again at home.  Mammogram pev ordered.    Brother had noted a few times prev in cold weather that her fingers and toes were cold in spite of the house being warm.  Not noted in the last few weeks.  It wasn't consistent, was only episodic at the time.  See exam below.  Discussed her living situation.  She may be moving back to [place] in the spring of 2023.  I'll be glad to see patient whenever and however possible.  D/w pt.    Meds, vitals, and allergies reviewed.   ROS: Per HPI unless specifically indicated in ROS section   nad In wheelchair.  Responds in 1-2 words at a time.  Responsive.  Eye tracks.  Speech is soft. Neck supple, no LA Rrr Ctab Abdomen soft, not tender. Trace BLE edema.   Skin well perfused.  Normal radial pulse and normal capillary refill in the bilateral hands. Right hand  contracture at baseline.

## 2021-06-06 NOTE — Patient Instructions (Addendum)
Let me check with the [facility] clinic about a possible follow up MRI.    I will work on OT and your wheelchair order.   Take care.  Glad to see you. Update me as needed.    Please can call for a mammogram at the [facility] [phone]

## 2021-06-07 ENCOUNTER — Telehealth: Payer: Self-pay | Admitting: Family Medicine

## 2021-06-07 DIAGNOSIS — R209 Unspecified disturbances of skin sensation: Secondary | ICD-10-CM | POA: Insufficient documentation

## 2021-06-07 DIAGNOSIS — R269 Unspecified abnormalities of gait and mobility: Secondary | ICD-10-CM

## 2021-06-07 DIAGNOSIS — Z85841 Personal history of malignant neoplasm of brain: Secondary | ICD-10-CM

## 2021-06-07 NOTE — Assessment & Plan Note (Signed)
We will see about getting home health OT set up. ?

## 2021-06-07 NOTE — Assessment & Plan Note (Signed)
No seizures using Keppra.  Would continue as is. ?

## 2021-06-07 NOTE — Assessment & Plan Note (Signed)
Continue melatonin as is. ?

## 2021-06-07 NOTE — Assessment & Plan Note (Signed)
Continue nighttime O2 use as is. ?

## 2021-06-07 NOTE — Assessment & Plan Note (Signed)
She still going to keep her routine follow-up at [facility].  We will check to see if she needs any follow-up MRI surveillance at this point.  She does not have any new neurologic symptoms.  Still okay for outpatient follow-up.  She has right hand contracture at baseline and we will see about getting OT set up for that at home.  See orders.  She needs a manual wheelchair that her brother can help her use at home.  We will check on getting an order for that. ? ?She may be moving closer to other family members in [place], [place]) but I will be glad to see this patient whenever possible. ?

## 2021-06-07 NOTE — Telephone Encounter (Signed)
Please contact the [facility] and see if she is going to need any surveillance MRI at this point.  Patient was asking about it and I need input from [facility]. ? ? [facility]   ?[place]   ?[address]   ?[facility] [place], [place] [address]   ?[phone]    ? ? ?She needs a manual wheelchair.  Please send the DME order.  Supplies current are through [facility] in [place].  Diagnosis gait abnormality, history of astrocytoma. ? ? ?I put in the order for home health OT and they should get a call about that soon.  Thanks. ?

## 2021-06-07 NOTE — Assessment & Plan Note (Signed)
Episodically noted previously but not consistently.  Normal perfusion now.  This could be a Raynaud's phenomenon but I do not see any evidence of an ominous process currently.  I would observe for now.  Brother agrees.  They can update me as needed. ?

## 2021-06-09 ENCOUNTER — Telehealth: Payer: Self-pay | Admitting: Family Medicine

## 2021-06-09 NOTE — Telephone Encounter (Signed)
Called and spoke with [facility] as requested. They have not seen patient since 2016 and have no advice to give in this situation. Patient has no showed many times since then and has not been followed since.  ? ?I have placed the DME order for her wheelchair.  ?

## 2021-06-09 NOTE — Addendum Note (Signed)
Addended by: Sherrilee Gilles B on: 06/09/2021 04:37 PM ? ? Modules accepted: Orders ? ?

## 2021-06-09 NOTE — Telephone Encounter (Signed)
Kelly with [facility] called stating that pt wanting to delay her home Health Aid until 06/15/21 or 06/16/21. ?

## 2021-06-11 NOTE — Telephone Encounter (Signed)
Noted.  Fine with me.  I will defer.  Thanks. ?

## 2021-06-12 NOTE — Telephone Encounter (Signed)
Spoke with patients brother Audrey Turner about below message from Dr. Damita Dunnings. [name] verbalized understanding and will speak with her current doctor at her next appt next month. ?

## 2021-06-12 NOTE — Telephone Encounter (Signed)
Please update patient's brother.  She has been following up with the neuropsychological/cognitive clinic and they may be able to offer some advice when she has her next appointment.  Thanks. ?

## 2021-06-12 NOTE — Telephone Encounter (Signed)
Spoke with Claiborne Billings and let her know Dr. Damita Dunnings was okay with patient wanting to wait until 06/16/21 to have home health come in.  ?

## 2021-06-30 ENCOUNTER — Ambulatory Visit
Admission: RE | Admit: 2021-06-30 | Discharge: 2021-06-30 | Disposition: A | Discharge status: Home / Self Care | Payer: Medicare HMO | Source: Ambulatory Visit | Attending: Family Medicine | Admitting: Family Medicine

## 2021-06-30 DIAGNOSIS — Z1231 Encounter for screening mammogram for malignant neoplasm of breast: Secondary | ICD-10-CM | POA: Diagnosis not present

## 2021-07-03 DIAGNOSIS — J9610 Chronic respiratory failure, unspecified whether with hypoxia or hypercapnia: Secondary | ICD-10-CM | POA: Diagnosis not present

## 2021-08-03 DIAGNOSIS — J9610 Chronic respiratory failure, unspecified whether with hypoxia or hypercapnia: Secondary | ICD-10-CM | POA: Diagnosis not present

## 2021-08-21 ENCOUNTER — Ambulatory Visit: Payer: Medicare HMO | Admitting: Internal Medicine

## 2021-09-02 DIAGNOSIS — J9610 Chronic respiratory failure, unspecified whether with hypoxia or hypercapnia: Secondary | ICD-10-CM | POA: Diagnosis not present

## 2021-09-22 ENCOUNTER — Telehealth: Payer: Self-pay | Admitting: Neurology

## 2021-09-22 DIAGNOSIS — G40209 Localization-related (focal) (partial) symptomatic epilepsy and epileptic syndromes with complex partial seizures, not intractable, without status epilepticus: Secondary | ICD-10-CM

## 2021-09-22 DIAGNOSIS — Z85841 Personal history of malignant neoplasm of brain: Secondary | ICD-10-CM

## 2021-09-22 MED ORDER — LEVETIRACETAM 100 MG/ML PO SOLN: 1000.0000 mg | Freq: Two times a day (BID) | ORAL | 0 refills | Status: DC

## 2021-09-22 NOTE — Telephone Encounter (Signed)
Pt called informed that prescription was sent to pharmacy requested

## 2021-09-22 NOTE — Telephone Encounter (Signed)
Patient needs a refill on Keppra, they only have enough for today.  They are out of town so need it sent to [facility] [place], [facility] [address].

## 2021-09-27 ENCOUNTER — Other Ambulatory Visit: Payer: Self-pay | Admitting: Neurology

## 2021-09-27 DIAGNOSIS — G40209 Localization-related (focal) (partial) symptomatic epilepsy and epileptic syndromes with complex partial seizures, not intractable, without status epilepticus: Secondary | ICD-10-CM

## 2021-09-27 DIAGNOSIS — Z85841 Personal history of malignant neoplasm of brain: Secondary | ICD-10-CM

## 2021-10-01 NOTE — Progress Notes (Signed)
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  NPSG 02/26/16- AHI 3.7/hour (WNL) with mean oxygen saturation 93.4% and nadir 65%, moderate snoring, body weight 168 pounds. Office Spirometry 07/03/2016-unreliable, submaximal effort read as "moderate restriction". FVC 1.90/66%, FEV1 1.43/63%, ratio 0.75, FEF 25-75% 1.26/53% Overnight oximetry 07/05/16-qualified for sleep O2 -------------------------------------------------------------------------------------------------------------  08/18/20-61 year old female never smoker followed for sleep associated Hypoxemia,, complicated by cognitive dysfunction treated with Ritalin.,  after resection of Astrocytoma /XRT/ chemo, Seizure, Hyperlipiemia,  Brother is caregiver and contact for this visit. O2 2L sleep/ [employer] Covid vax- -----No complaints.   Denies any change in breathing or any acute event.Occ problem falling asleep. Brother sleeps in separate room but has monitor and says she breathes quietly.  10/03/21- 61 year old female never smoker followed for sleep associated Hypoxemia,, complicated by cognitive dysfunction treated with Ritalin.,  after resection of Astrocytoma /XRT/ chemo, Seizure, Spastic Hemiplegia,  Hyperlipiemia,  Brother is caregiver and contact for this visit. O2 2L sleep/ [employer] Covid vax-3 [employer] -----Patient feels like she has been sleeping good. Brother states she has had trouble falling asleep recently and he has been giving her a child's dose melatonin and she goes to sleep in 5 minutes.  Noise of oxygen concentrator sometimes bothersome but does sleep with it. Rarely she gets choked and then brother notes she has strong and effective cough.   ROS-see HPI    "+"  = positive Constitutional:    weight loss, night sweats, fevers, chills,  fatigue, lassitude. HEENT:    headaches, difficulty swallowing, tooth/dental problems, sore throat,       sneezing, itching, ear ache, nasal congestion, post nasal drip, snoring CV:    chest pain, orthopnea, PND, swelling in lower extremities, anasarca,                                                      dizziness, palpitations Resp:   shortness of breath with exertion or at rest.                productive cough,   non-productive cough, coughing up of blood.              change in color of mucus.  wheezing.   Skin:    rash or lesions. GI:  No-   heartburn, indigestion, abdominal pain, nausea, vomiting, diarrhea,                 change in bowel habits, loss of appetite GU: dysuria, change in color of urine, no urgency or frequency.   flank pain. MS:   joint pain, stiffness, decreased range of motion, back pain. Neuro-     HPI/ per Neurolgy Psych:  HPI  OBJ- Physical Exam General- Alert, Oriented, Affect-flat, Distress- none acute. +Wheelchair/ semi recumbent. + Obese Skin- rash-none, lesions- none, excoriation- none Lymphadenopathy- none Head- atraumatic            Eyes- Gross vision intact, PERRLA, conjunctivae and secretions clear            Ears- Hearing, canals-normal            Nose- Clear, no-Septal dev, mucus, polyps, erosion, perforation  Throat- Mallampati II , mucosa clear , drainage- none, tonsils- atrophic Neck- flexible , trachea midline, no stridor , thyroid nl, carotid no bruit Chest - symmetrical excursion , unlabored           Heart/CV- RRR , no murmur , no gallop  , no rub, nl s1 s2                           - JVD+ 1 , edema +1, stasis changes- none, varices- none           Lung- +clear/ unlabored, wheeze- none, cough- none , dullness-none, rub- none           Chest wall-  Abd-  Br/ Gen/ Rectal- Not done, not indicated Extrem- cyanosis- none, clubbing, none, atrophy- none, strength- nl Neuro- +says little, but smiles interactively and tracks with her  eyes

## 2021-10-03 ENCOUNTER — Ambulatory Visit: Payer: Medicare HMO | Admitting: Internal Medicine

## 2021-10-03 ENCOUNTER — Encounter: Payer: Self-pay | Admitting: Internal Medicine

## 2021-10-03 DIAGNOSIS — G4734 Idiopathic sleep related nonobstructive alveolar hypoventilation: Secondary | ICD-10-CM | POA: Diagnosis not present

## 2021-10-03 DIAGNOSIS — G4733 Obstructive sleep apnea (adult) (pediatric): Secondary | ICD-10-CM

## 2021-10-03 DIAGNOSIS — J9610 Chronic respiratory failure, unspecified whether with hypoxia or hypercapnia: Secondary | ICD-10-CM | POA: Diagnosis not present

## 2021-10-12 ENCOUNTER — Encounter: Payer: Self-pay | Admitting: Family Medicine

## 2021-10-12 ENCOUNTER — Ambulatory Visit (INDEPENDENT_AMBULATORY_CARE_PROVIDER_SITE_OTHER): Payer: Medicare HMO | Admitting: Family Medicine

## 2021-10-12 DIAGNOSIS — R040 Epistaxis: Secondary | ICD-10-CM | POA: Diagnosis not present

## 2021-10-12 NOTE — Patient Instructions (Signed)
Let me check with [employer] about a humidifier and see if that takes care of it.  If you continue to have trouble then let me know.  Take care.  Glad to see you.

## 2021-10-12 NOTE — Progress Notes (Signed)
Epistaxis.  No aspirin or blood thinners.  Bleeding noted L nostril.  Usually didn't have bleeds at baseline but 3 this spring.  One in April, then another about 2 weeks ago.  Then 3rd episode 3 days ago.  Bleeds and then usually stops after about 30 seconds.  No trigger, no trauma. No other bleeding.  No fevers.  No facial pain.  No ST or cough.  She is using O2 at night w/o a humidifier.   Meds, vitals, and allergies reviewed.   ROS: Per HPI unless specifically indicated in ROS section   GEN: nad, in wheelchair at baseline.  HEENT: ncat,  TM WNL B, nasal exam normal without any bleeding or lesions noted. NECK: supple w/o LA CV: rrr. PULM: ctab, no inc wob

## 2021-10-15 ENCOUNTER — Telehealth: Payer: Self-pay | Admitting: Family Medicine

## 2021-10-15 DIAGNOSIS — R040 Epistaxis: Secondary | ICD-10-CM

## 2021-10-15 NOTE — Telephone Encounter (Signed)
Please check with [employer]-her home O2 supplier.. Needs humidifier order for her nighttime O2.  Please give verbal order.  Diagnosis for the humidifier is epistaxis.  R04.0.

## 2021-10-15 NOTE — Assessment & Plan Note (Signed)
Normal exam and I question if her symptoms are exacerbated by nocturnal O2 use via nasal cannula.  We talked about getting a humidifier order for her nighttime O2.  [employer] is her supplier.  I will check with staff about this.  If symptoms continue then I want patient/family to update me.  I do not suspect an ominous diagnosis as she has no other bleeding.

## 2021-10-16 NOTE — Addendum Note (Signed)
Addended by: Sherrilee Gilles B on: 10/16/2021 04:14 PM   Modules accepted: Orders

## 2021-10-16 NOTE — Telephone Encounter (Signed)
DME order has been placed and faxed to [facility] for humidifier.

## 2021-11-02 DIAGNOSIS — J9610 Chronic respiratory failure, unspecified whether with hypoxia or hypercapnia: Secondary | ICD-10-CM | POA: Diagnosis not present

## 2021-11-09 ENCOUNTER — Encounter: Payer: Self-pay | Admitting: Internal Medicine

## 2021-11-09 NOTE — Assessment & Plan Note (Addendum)
Not documented on repeat study 2017

## 2021-11-09 NOTE — Assessment & Plan Note (Addendum)
Continues to wear O2 2L, but doesn't like noisy concentrator. 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  Plan- continue O2 2l for sleep

## 2021-11-20 ENCOUNTER — Other Ambulatory Visit: Payer: Self-pay

## 2021-11-20 DIAGNOSIS — Z85841 Personal history of malignant neoplasm of brain: Secondary | ICD-10-CM

## 2021-11-20 DIAGNOSIS — G40209 Localization-related (focal) (partial) symptomatic epilepsy and epileptic syndromes with complex partial seizures, not intractable, without status epilepticus: Secondary | ICD-10-CM

## 2021-11-20 MED ORDER — LEVETIRACETAM 100 MG/ML PO SOLN: 1000.0000 mg | Freq: Two times a day (BID) | ORAL | 0 refills | Status: DC

## 2021-11-22 ENCOUNTER — Other Ambulatory Visit: Payer: Self-pay

## 2021-11-22 DIAGNOSIS — Z85841 Personal history of malignant neoplasm of brain: Secondary | ICD-10-CM

## 2021-11-22 DIAGNOSIS — G40209 Localization-related (focal) (partial) symptomatic epilepsy and epileptic syndromes with complex partial seizures, not intractable, without status epilepticus: Secondary | ICD-10-CM

## 2021-11-22 MED ORDER — LEVETIRACETAM 100 MG/ML PO SOLN: 1000.0000 mg | Freq: Two times a day (BID) | ORAL | 0 refills | Status: DC

## 2021-11-29 ENCOUNTER — Other Ambulatory Visit: Payer: Self-pay | Admitting: Neurology

## 2021-11-29 DIAGNOSIS — G40209 Localization-related (focal) (partial) symptomatic epilepsy and epileptic syndromes with complex partial seizures, not intractable, without status epilepticus: Secondary | ICD-10-CM

## 2021-11-29 DIAGNOSIS — Z85841 Personal history of malignant neoplasm of brain: Secondary | ICD-10-CM

## 2021-12-03 DIAGNOSIS — J9610 Chronic respiratory failure, unspecified whether with hypoxia or hypercapnia: Secondary | ICD-10-CM | POA: Diagnosis not present

## 2022-01-02 ENCOUNTER — Other Ambulatory Visit: Payer: Self-pay | Admitting: Neurology

## 2022-01-02 DIAGNOSIS — G40209 Localization-related (focal) (partial) symptomatic epilepsy and epileptic syndromes with complex partial seizures, not intractable, without status epilepticus: Secondary | ICD-10-CM

## 2022-01-02 DIAGNOSIS — Z85841 Personal history of malignant neoplasm of brain: Secondary | ICD-10-CM

## 2022-02-13 ENCOUNTER — Ambulatory Visit (INDEPENDENT_AMBULATORY_CARE_PROVIDER_SITE_OTHER): Payer: Self-pay | Admitting: Neurology

## 2022-02-13 ENCOUNTER — Encounter: Payer: Self-pay | Admitting: Neurology

## 2022-02-13 VITALS — BP 147/84 | HR 68

## 2022-02-13 DIAGNOSIS — Z85841 Personal history of malignant neoplasm of brain: Secondary | ICD-10-CM

## 2022-02-13 DIAGNOSIS — G40209 Localization-related (focal) (partial) symptomatic epilepsy and epileptic syndromes with complex partial seizures, not intractable, without status epilepticus: Secondary | ICD-10-CM

## 2022-02-13 MED ORDER — LEVETIRACETAM 100 MG/ML PO SOLN: 1000.0000 mg | Freq: Two times a day (BID) | ORAL | 3 refills | Status: DC

## 2022-02-13 NOTE — Progress Notes (Signed)
NEUROLOGY FOLLOW UP OFFICE NOTE  Audrey Turner [id number] 12-May-1960  HISTORY OF PRESENT ILLNESS: I had the pleasure of seeing Audrey Turner in follow-up in the neurology clinic on 02/13/2022.  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 She is again accompanied by her brother Audrey Turner who helps supplement the history today.  Since her last visit, she continues to do well from a seizure standpoint with no seizures since 12/2018 on Levetiracetam '1000mg'$  BID. She has not been back to [facility] for follow-up on mood issues and repeat MRI scheduling. Audrey Turner reports that she does not talk much at all now. Their adopted sister did pass away 2 weeks ago and they were very close. She is more alert in the morning, then does not talk much in the evening. She speaks in whispers. He knows it is not her vocal cords, because they do hear her scream out loud to their mother or brother when riled up. She is sleeping well, for a time she had sleep difficulties but Audrey Turner would give 1/2 dose chidren's melatonin with good effect. Sometimes she takes it twice a week, sometimes she does not need it for 2-3 months. She denies any headaches, dizziness, vision changes, no falls. Audrey Turner helps with transfers, she can stand and step with her left leg when transferring, he uses a lift belt to transfer to bed and toilet. She can feed herself sometimes. He is wondering about the petechiae on her left hand. They currently move back and forth from their home to their mother's home in [place]. Currently her daughter, son-in-law, and 63 month old twin grandchildren are staying with them.   HPI reviewed from Dr. Doristine Devoid note and per brother: This is a 61 year old right-handed woman with a history of left frontal astrocytoma s/p multiple resections.  Her first resection was in October, 2007, followed by a repeat in August, 2008.  She has been followed by [facility] for this. She is  also being seen at [facility] by psychotherapy for frontal lobe syndrome/cognitive dysfunction. Her brother reports she did well for 5 years post-surgery, then for since 2014, she has been declining, to the point that he had to move in with her in July 2017. She was in in her usual state of health on 12/09/2015 when she and her brother went to the mattress store (had done PT before that).   Upon exiting her vehicle her brother noted she suddenly stopped and could not move, then started having right arm jerking.   Her brother states that her left arm shook some but not like the right. She was helped to the ground.  Her eyes rolled back and lasted about 3 minutes.  EMS was contacted and once shaking stopped and waking up, she had trouble moving the right arm.  By the time she got to the hospital, the R arm was stronger and back to baseline.   . Patient has no prior history of seizure.  She was started on Keppra 500 mg twice a day. After her first seizure, she did not follow-up with PT, her brother reported she does not follow PT advice. She has become mostly wheelchair-bound. She is alone during the day but brother lives with her at night.  Brother states that she does nothing but watch TV during the day.  Seems to get confused/dazed by 8pm.  Has EDS.  Hx of OSA but refused CPAP years ago, she was Dr. Annamaria Boots who  noted nocturnal hypoxemia. Brother also reported day/night reversal.   Treatment History via review of [facility] records:  01/10/06: Craniotomy and debulking performed at outside hospital revealing anaplastic astrocytoma. This was followed by Temozolomide and radiation and then six months of Temozolomide with last cycle of Temozolomide initiated on 08/10/06.  09/30/06: Radiographic evidence of new rim enhancing lesion butting off of the original resection cavity consistent with progressive disease.  10/15/06: New patient evaluation at [facility] [place]. 10/17/06: Craniotomy with  resection of tumor with Dr. Derrill Memo, and pathology confirming anaplastic astrocytoma.  11/19/06: Patient initiated on Zactima, Imatinib and Hydroxyurea per protocol.  01/20/07: Patient has completed two cycles on protocol utilizing Zactima, Gleevec, and Hydrea.  02/24/07: Patient is now status post three cycles of Zactima/Gleevec/Hydrea on protocol. She has had a dose reduction for cycle 3 with Zactima at 100 mg p.o. DAILY and Gleevec 300 mg p.o. DAILY due to problems with diarrhea.  03/24/07: Patient is status post four cycles of therapy on protocol with Zactima, Gleevec and Hydrea.  05/19/07: Patient is status post five cycles of Zactima, Gleevec, and Hydroxyurea on protocol; last dose on 04/18/07 and has been off therapy since that time secondary to requiring surgery on her craniotomy site. We will await recommendations from Dr. Beverely Low in Plastic Surgery prior to continuing with therapy. She is clinically and radiographically stable at this time.  09/22/07: Patient is status post four cycles of VP-16 x 21 days per 28-day cycle.  05/26/08: Status post twelve cycles of Etoposide; off therapy.   Diagnostic Data: 24-hour EEG which captured 2 episodes of being dazed, no EEG changes seen. EEG showed focal left hemisphere slowing, no epileptiform discharges. 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 There was left frontal lobe gliosis surrounding the resection cavity, additional patchy white matter changes. There were subcentimeter T2 bright cysts in the left corona radiata and right parietal lobe, favoring post-treatment changes.    PAST MEDICAL HISTORY: Past Medical History:  Diagnosis Date   Acute sinusitis, unspecified    Astrocytoma ([place]) 01/2006 /09/2006   grade 3 atrocytoma  s/p chemo/radiation/surgery at [facility], in remission since at least 2011   Cancer [facility])    Brain    Diarrhea    Drug induced neutropenia(288.03)    Primary exertional headache    Recurrent depression ([place])    Vaginitis     MEDICATIONS: Current Outpatient Medications on File Prior to Visit  Medication Sig Dispense Refill   Amantadine HCl 100 MG tablet Take 1 tab qam and 1 tab qnoon     ARIPiprazole (ABILIFY) 5 MG tablet Take 5 mg by mouth daily.     desvenlafaxine (PRISTIQ) 50 MG 24 hr tablet Take 50 mg by mouth daily.     levETIRAcetam (KEPPRA) 100 MG/ML solution TAKE 10 MLS (1,000 MG TOTAL) BY MOUTH 2 (TWO) TIMES DAILY. SHAKE WELL PRIOR TO ADMINISTERING. 1419 mL 0   MELATONIN KIDS PO Take by mouth.     Multiple Vitamin (MULTIVITAMIN) capsule Take 1 capsule by mouth daily.     No current facility-administered medications on file prior to visit.    ALLERGIES: Allergies  Allergen Reactions   Dilantin [Phenytoin Sodium Extended] Rash   Penicillins Rash    FAMILY HISTORY: Family History  Problem Relation Age  of Onset   Hypertension Mother    Hypertension Father    COPD Father    Sleep apnea Father    Breast cancer Paternal Aunt        56's   Brain cancer Paternal Aunt    Ovarian cancer Maternal Aunt    Colon cancer Neg Hx     SOCIAL HISTORY: Social History   Socioeconomic History   Marital status: Divorced    Spouse name: Not on file   Number of children: 3   Years of education: Not on file   Highest education level: Not on file  Occupational History   Occupation: stay at home mother  Tobacco Use   Smoking status: Never   Smokeless tobacco: Never  Vaping Use   Vaping Use: Never used  Substance and Sexual Activity   Alcohol use: No   Drug use: No   Sexual activity: Never    Birth control/protection: None  Other Topics Concern   Not on file  Social History Narrative   Divorced   From [place], [place]   Lives with brother.     Single story house, with ramp.      Social Determinants of Health   Financial Resource Strain: Low Risk  (05/23/2021)   Overall Financial Resource Strain (CARDIA)    Difficulty of Paying Living Expenses: Not hard at all  Food Insecurity: No Food Insecurity (05/23/2021)   Hunger Vital Sign    Worried About Running Out of Food in the Last Year: Never true    Ran Out of Food in the Last Year: Never true  Transportation Needs: No Transportation Needs (05/23/2021)   PRAPARE - Hydrologist (Medical): No    Lack of Transportation (Non-Medical): No  Physical Activity: Inactive (05/23/2021)   Exercise Vital Sign    Days of Exercise per Week: 0 days    Minutes of Exercise per Session: 0 min  Stress: No Stress Concern Present (05/23/2021)   [place]    Feeling of Stress : Not at all  Social Connections: Moderately Isolated (05/23/2021)   Social Connection and Isolation Panel [NHANES]    Frequency of Communication with Friends and Family: More than three times a week    Frequency of Social Gatherings with Friends and Family: Twice a week    Attends Religious Services: More than 4 times per year    Active Member of [employer] or Organizations: No    Attends Archivist Meetings: Never    Marital Status: Divorced  Human resources officer Violence: Not At Risk (05/23/2021)   Humiliation, Afraid, Rape, and Kick questionnaire    Fear of Current or Ex-Partner: No    Emotionally Abused: No    Physically Abused: No    Sexually Abused: No     PHYSICAL EXAM: Vitals:   02/13/22 1447  BP: (!) 147/84  Pulse: 68  SpO2: 98%   General: No acute distress, flat affect Head:  Normocephalic/atraumatic Skin/Extremities: petechiae on dorsum of left hand Neurological Exam: drowsy and closes eyes when not stimulated, easily arousable to answer questions where she speaks in a whisper, able to follow simple commands with bradykinesia. Cranial nerves:  Pupils equal, round. Extraocular movements intact with no nystagmus. Visual fields full.  No facial asymmetry.  Motor: right hand contracture, increased tone on both LE. 3/5 right UE, 4-/5 left UE, at least 3/5 both LE (poor effort). Gait not tested. There is a mild resting  tremor of the fingers on her left hand.    IMPRESSION: This is a pleasant 61 yo woman with a history of left frontal astrocytoma s/p resection x 2, chemotherapy and radiation, with frontal lobe syndrome with cognitive impairment and subsequent seizures with right-sided shaking and post-ictal Todd's paralysis in 2017. She had a transient episode of unresponsiveness last 12/2018 felt due to seizure. MRI brain no acute changes, stable from 2017.  She has been event-free since 12/2018, continue Levetiracetam '1000mg'$  BID. Her brother reports less interaction/speech, discussed reaching out to [facility] for repeat MRI and her Neuropsychiatrist to discuss if symptoms are related to worsening depression. Continue 24/7 care. Follow-up in 1 year, call for any changes.    Thank you for allowing me to participate in her care.  Please do not hesitate to call for any questions or concerns.    Ellouise Newer, M.D.   CC: Dr. Damita Dunnings

## 2022-02-13 NOTE — Patient Instructions (Signed)
Good to see you. Continue Keppra 15m twice a day.   [facility].  Follow-up in 1 year, call for any changes   Seizure Precautions: 1. If medication has been prescribed for you to prevent seizures, take it exactly as directed.  Do not stop taking the medicine without talking to your doctor first, even if you have not had a seizure in a long time.   2. Avoid activities in which a seizure would cause danger to yourself or to others.  Don't operate dangerous machinery, swim alone, or climb in high or dangerous places, such as on ladders, roofs, or girders.  Do not drive unless your doctor says you may.  3. If you have any warning that you may have a seizure, lay down in a safe place where you can't hurt yourself.    4.  No driving for 6 months from last seizure, [facility]   Please refer to the following link on the E[employer]averwebsite for more information: http://www.epilepsyfoundation.org/answerplace/Social/driving/drivingu.cfm   5.  Maintain good sleep hygiene.  6.  Contact your doctor if you have any problems that may be related to the medicine you are taking.  7.  Call 911 and bring the patient back to the ED if:        A.  The seizure lasts longer than 5 minutes.       B.  The patient doesn't awaken shortly after the seizure  C.  The patient has new problems such as difficulty seeing, speaking or moving  D.  The patient was injured during the seizure  E.  The patient has a temperature over 102 F (39C)  F.  The patient vomited and now is having trouble breathing

## 2022-03-20 ENCOUNTER — Other Ambulatory Visit: Payer: Self-pay | Admitting: Neurology

## 2022-03-20 DIAGNOSIS — G40209 Localization-related (focal) (partial) symptomatic epilepsy and epileptic syndromes with complex partial seizures, not intractable, without status epilepticus: Secondary | ICD-10-CM

## 2022-03-20 DIAGNOSIS — Z85841 Personal history of malignant neoplasm of brain: Secondary | ICD-10-CM

## 2022-07-02 ENCOUNTER — Telehealth: Payer: Self-pay | Admitting: Family Medicine

## 2022-07-02 NOTE — Telephone Encounter (Signed)
Contacted [place] to schedule their annual wellness visit. Appointment made for 07/24/2022.  [place] Direct Dial: [phone]

## 2022-07-20 ENCOUNTER — Telehealth: Payer: Self-pay | Admitting: Anesthesiology

## 2022-07-20 DIAGNOSIS — G40209 Localization-related (focal) (partial) symptomatic epilepsy and epileptic syndromes with complex partial seizures, not intractable, without status epilepticus: Secondary | ICD-10-CM

## 2022-07-20 DIAGNOSIS — Z85841 Personal history of malignant neoplasm of brain: Secondary | ICD-10-CM

## 2022-07-20 MED ORDER — LEVETIRACETAM 100 MG/ML PO SOLN: 1000.0000 mg | Freq: Two times a day (BID) | ORAL | 1 refills | Status: DC

## 2022-07-20 NOTE — Telephone Encounter (Signed)
RX sent to new pharmacy

## 2022-07-20 NOTE — Telephone Encounter (Signed)
Pt's brother called stating pt needs a refill on her medication and needs to go to a new pharmacy  1. Which medications need refilled? Levetiracetam 100 mg/solution  2. Which pharmacy/location is medication to be sent to? [facility] [address] [place], [place]  3. Do they need a 30 day or 90 day supply? 90 day supply

## 2022-07-21 ENCOUNTER — Other Ambulatory Visit: Payer: Self-pay | Admitting: Neurology

## 2022-07-21 DIAGNOSIS — G40209 Localization-related (focal) (partial) symptomatic epilepsy and epileptic syndromes with complex partial seizures, not intractable, without status epilepticus: Secondary | ICD-10-CM

## 2022-07-21 DIAGNOSIS — Z85841 Personal history of malignant neoplasm of brain: Secondary | ICD-10-CM

## 2022-09-24 ENCOUNTER — Other Ambulatory Visit: Payer: Self-pay | Admitting: Neurology

## 2022-09-24 DIAGNOSIS — Z85841 Personal history of malignant neoplasm of brain: Secondary | ICD-10-CM

## 2022-09-24 DIAGNOSIS — G40209 Localization-related (focal) (partial) symptomatic epilepsy and epileptic syndromes with complex partial seizures, not intractable, without status epilepticus: Secondary | ICD-10-CM

## 2023-01-12 ENCOUNTER — Other Ambulatory Visit: Payer: Self-pay | Admitting: Neurology

## 2023-01-12 DIAGNOSIS — Z85841 Personal history of malignant neoplasm of brain: Secondary | ICD-10-CM

## 2023-01-12 DIAGNOSIS — G40209 Localization-related (focal) (partial) symptomatic epilepsy and epileptic syndromes with complex partial seizures, not intractable, without status epilepticus: Secondary | ICD-10-CM

## 2023-01-29 ENCOUNTER — Other Ambulatory Visit: Payer: Self-pay | Admitting: Neurology

## 2023-01-29 DIAGNOSIS — Z85841 Personal history of malignant neoplasm of brain: Secondary | ICD-10-CM

## 2023-01-29 DIAGNOSIS — G40209 Localization-related (focal) (partial) symptomatic epilepsy and epileptic syndromes with complex partial seizures, not intractable, without status epilepticus: Secondary | ICD-10-CM

## 2023-02-15 ENCOUNTER — Ambulatory Visit: Payer: Self-pay | Admitting: Neurology

## 2023-02-25 ENCOUNTER — Encounter: Payer: Self-pay | Admitting: Neurology

## 2023-03-05 ENCOUNTER — Other Ambulatory Visit: Payer: Self-pay | Admitting: Neurology

## 2023-03-05 DIAGNOSIS — Z85841 Personal history of malignant neoplasm of brain: Secondary | ICD-10-CM

## 2023-03-05 DIAGNOSIS — G40209 Localization-related (focal) (partial) symptomatic epilepsy and epileptic syndromes with complex partial seizures, not intractable, without status epilepticus: Secondary | ICD-10-CM

## 2023-03-14 ENCOUNTER — Encounter: Payer: Self-pay | Admitting: Family Medicine

## 2023-03-14 ENCOUNTER — Ambulatory Visit (INDEPENDENT_AMBULATORY_CARE_PROVIDER_SITE_OTHER): Payer: Self-pay | Admitting: Family Medicine

## 2023-03-14 VITALS — BP 128/70 | HR 100 | Temp 97.2°F | Ht 60.0 in | Wt 172.0 lb

## 2023-03-14 DIAGNOSIS — J069 Acute upper respiratory infection, unspecified: Secondary | ICD-10-CM

## 2023-03-14 MED ORDER — AMANTADINE HCL 100 MG PO TABS: 100.0000 mg | ORAL_TABLET | Freq: Two times a day (BID) | ORAL | Status: DC

## 2023-03-14 NOTE — Patient Instructions (Signed)
Continue with soft foods and let me know if not gradually getting better.  Take care.  Glad to see you.

## 2023-03-14 NOTE — Progress Notes (Addendum)
Recent med change noted.  Decrease in abilify, down to 2.5mg , per [facility].  She had been less verbal prev.    Recent ST/dysphagia.  Brother recently with URI sx- that predates patient's sx. She had recent nasal congestion but not chest congestion.  She had difficulty with swallowing liquids or some solids.  She has been taking mashed potatoes and gravy, pasta, soft foods.    No fevers, no cough.  Brother tried to check her throat at home, saw a reddish area per his report.  Here for eval today.  Brother noted the patient was more alert today, not back to baseline but improved from the last few days. The plan after the office visit is for her to go with her brother to a [employer] and get grits and eggs.  Meds, vitals, and allergies reviewed.   ROS: Per HPI unless specifically indicated in ROS section   Nad In wheelchair.   Appears more tired than prev baseline She is able to speak 1-2 words at a time without respiratory distress.  Has volitional movement still follows the conversation.  Voice is hoarse. MMM Mild posterior OP irritation, with some post nasal gtt noted.  No exudates. No ulceration  Neck without lymphadenopathy. Rrr Ctab, no stridor Abd soft. Skin well-perfused.

## 2023-03-16 ENCOUNTER — Emergency Department (HOSPITAL_COMMUNITY): Payer: Medicare Other

## 2023-03-16 ENCOUNTER — Other Ambulatory Visit: Payer: Self-pay

## 2023-03-16 ENCOUNTER — Inpatient Hospital Stay (HOSPITAL_COMMUNITY)
Admission: EM | Admit: 2023-03-16 | Discharge: 2023-04-10 | DRG: 870 | Disposition: A | Discharge status: Hospice | Payer: Medicare Other | Attending: Emergency Medicine | Admitting: Emergency Medicine

## 2023-03-16 ENCOUNTER — Emergency Department (HOSPITAL_COMMUNITY): Payer: Self-pay

## 2023-03-16 ENCOUNTER — Inpatient Hospital Stay (HOSPITAL_COMMUNITY): Payer: Self-pay

## 2023-03-16 DIAGNOSIS — I429 Cardiomyopathy, unspecified: Secondary | ICD-10-CM | POA: Diagnosis present

## 2023-03-16 DIAGNOSIS — D638 Anemia in other chronic diseases classified elsewhere: Secondary | ICD-10-CM | POA: Diagnosis present

## 2023-03-16 DIAGNOSIS — E870 Hyperosmolality and hypernatremia: Secondary | ICD-10-CM | POA: Diagnosis present

## 2023-03-16 DIAGNOSIS — R9082 White matter disease, unspecified: Secondary | ICD-10-CM | POA: Diagnosis present

## 2023-03-16 DIAGNOSIS — Z8041 Family history of malignant neoplasm of ovary: Secondary | ICD-10-CM

## 2023-03-16 DIAGNOSIS — I69819 Unspecified symptoms and signs involving cognitive functions following other cerebrovascular disease: Secondary | ICD-10-CM

## 2023-03-16 DIAGNOSIS — R4182 Altered mental status, unspecified: Secondary | ICD-10-CM | POA: Diagnosis not present

## 2023-03-16 DIAGNOSIS — N179 Acute kidney failure, unspecified: Secondary | ICD-10-CM | POA: Diagnosis present

## 2023-03-16 DIAGNOSIS — J15211 Pneumonia due to Methicillin susceptible Staphylococcus aureus: Secondary | ICD-10-CM | POA: Diagnosis present

## 2023-03-16 DIAGNOSIS — R6521 Severe sepsis with septic shock: Secondary | ICD-10-CM | POA: Diagnosis present

## 2023-03-16 DIAGNOSIS — F0393 Unspecified dementia, unspecified severity, with mood disturbance: Secondary | ICD-10-CM | POA: Diagnosis present

## 2023-03-16 DIAGNOSIS — E872 Acidosis, unspecified: Secondary | ICD-10-CM | POA: Diagnosis not present

## 2023-03-16 DIAGNOSIS — Z79899 Other long term (current) drug therapy: Secondary | ICD-10-CM

## 2023-03-16 DIAGNOSIS — E43 Unspecified severe protein-calorie malnutrition: Secondary | ICD-10-CM | POA: Diagnosis present

## 2023-03-16 DIAGNOSIS — M6250 Muscle wasting and atrophy, not elsewhere classified, unspecified site: Secondary | ICD-10-CM | POA: Diagnosis present

## 2023-03-16 DIAGNOSIS — G9341 Metabolic encephalopathy: Secondary | ICD-10-CM | POA: Diagnosis present

## 2023-03-16 DIAGNOSIS — R4701 Aphasia: Secondary | ICD-10-CM | POA: Diagnosis present

## 2023-03-16 DIAGNOSIS — A4101 Sepsis due to Methicillin susceptible Staphylococcus aureus: Principal | ICD-10-CM | POA: Diagnosis present

## 2023-03-16 DIAGNOSIS — L89150 Pressure ulcer of sacral region, unstageable: Secondary | ICD-10-CM | POA: Diagnosis present

## 2023-03-16 DIAGNOSIS — Z8249 Family history of ischemic heart disease and other diseases of the circulatory system: Secondary | ICD-10-CM

## 2023-03-16 DIAGNOSIS — Z825 Family history of asthma and other chronic lower respiratory diseases: Secondary | ICD-10-CM

## 2023-03-16 DIAGNOSIS — Z808 Family history of malignant neoplasm of other organs or systems: Secondary | ICD-10-CM

## 2023-03-16 DIAGNOSIS — Z515 Encounter for palliative care: Secondary | ICD-10-CM

## 2023-03-16 DIAGNOSIS — Z7189 Other specified counseling: Secondary | ICD-10-CM | POA: Diagnosis not present

## 2023-03-16 DIAGNOSIS — I110 Hypertensive heart disease with heart failure: Secondary | ICD-10-CM | POA: Diagnosis present

## 2023-03-16 DIAGNOSIS — I5042 Chronic combined systolic (congestive) and diastolic (congestive) heart failure: Secondary | ICD-10-CM | POA: Diagnosis present

## 2023-03-16 DIAGNOSIS — R197 Diarrhea, unspecified: Secondary | ICD-10-CM | POA: Diagnosis not present

## 2023-03-16 DIAGNOSIS — L89621 Pressure ulcer of left heel, stage 1: Secondary | ICD-10-CM | POA: Diagnosis present

## 2023-03-16 DIAGNOSIS — G40901 Epilepsy, unspecified, not intractable, with status epilepticus: Secondary | ICD-10-CM | POA: Diagnosis present

## 2023-03-16 DIAGNOSIS — A419 Sepsis, unspecified organism: Principal | ICD-10-CM | POA: Diagnosis present

## 2023-03-16 DIAGNOSIS — Z888 Allergy status to other drugs, medicaments and biological substances status: Secondary | ICD-10-CM

## 2023-03-16 DIAGNOSIS — L89313 Pressure ulcer of right buttock, stage 3: Secondary | ICD-10-CM | POA: Diagnosis present

## 2023-03-16 DIAGNOSIS — Z85841 Personal history of malignant neoplasm of brain: Secondary | ICD-10-CM

## 2023-03-16 DIAGNOSIS — I69811 Memory deficit following other cerebrovascular disease: Secondary | ICD-10-CM

## 2023-03-16 DIAGNOSIS — E876 Hypokalemia: Secondary | ICD-10-CM | POA: Diagnosis not present

## 2023-03-16 DIAGNOSIS — F339 Major depressive disorder, recurrent, unspecified: Secondary | ICD-10-CM | POA: Diagnosis present

## 2023-03-16 DIAGNOSIS — R471 Dysarthria and anarthria: Secondary | ICD-10-CM | POA: Diagnosis present

## 2023-03-16 DIAGNOSIS — Z66 Do not resuscitate: Secondary | ICD-10-CM | POA: Diagnosis not present

## 2023-03-16 DIAGNOSIS — J690 Pneumonitis due to inhalation of food and vomit: Secondary | ICD-10-CM | POA: Diagnosis present

## 2023-03-16 DIAGNOSIS — D689 Coagulation defect, unspecified: Secondary | ICD-10-CM | POA: Diagnosis present

## 2023-03-16 DIAGNOSIS — E875 Hyperkalemia: Secondary | ICD-10-CM | POA: Diagnosis present

## 2023-03-16 DIAGNOSIS — I69851 Hemiplegia and hemiparesis following other cerebrovascular disease affecting right dominant side: Secondary | ICD-10-CM

## 2023-03-16 DIAGNOSIS — Z6823 Body mass index (BMI) 23.0-23.9, adult: Secondary | ICD-10-CM

## 2023-03-16 DIAGNOSIS — J9601 Acute respiratory failure with hypoxia: Secondary | ICD-10-CM | POA: Diagnosis present

## 2023-03-16 DIAGNOSIS — E860 Dehydration: Secondary | ICD-10-CM | POA: Diagnosis present

## 2023-03-16 DIAGNOSIS — G9389 Other specified disorders of brain: Secondary | ICD-10-CM | POA: Diagnosis present

## 2023-03-16 DIAGNOSIS — Z880 Allergy status to penicillin: Secondary | ICD-10-CM

## 2023-03-16 DIAGNOSIS — R1310 Dysphagia, unspecified: Secondary | ICD-10-CM | POA: Diagnosis present

## 2023-03-16 DIAGNOSIS — Z9221 Personal history of antineoplastic chemotherapy: Secondary | ICD-10-CM

## 2023-03-16 DIAGNOSIS — Z9889 Other specified postprocedural states: Secondary | ICD-10-CM

## 2023-03-16 DIAGNOSIS — M62421 Contracture of muscle, right upper arm: Secondary | ICD-10-CM | POA: Diagnosis present

## 2023-03-16 DIAGNOSIS — R6520 Severe sepsis without septic shock: Secondary | ICD-10-CM | POA: Diagnosis present

## 2023-03-16 DIAGNOSIS — Z993 Dependence on wheelchair: Secondary | ICD-10-CM

## 2023-03-16 DIAGNOSIS — R64 Cachexia: Secondary | ICD-10-CM | POA: Diagnosis not present

## 2023-03-16 DIAGNOSIS — L8990 Pressure ulcer of unspecified site, unspecified stage: Secondary | ICD-10-CM | POA: Insufficient documentation

## 2023-03-16 DIAGNOSIS — Z923 Personal history of irradiation: Secondary | ICD-10-CM

## 2023-03-16 DIAGNOSIS — L659 Nonscarring hair loss, unspecified: Secondary | ICD-10-CM | POA: Diagnosis present

## 2023-03-16 DIAGNOSIS — R739 Hyperglycemia, unspecified: Secondary | ICD-10-CM | POA: Diagnosis not present

## 2023-03-16 DIAGNOSIS — Z803 Family history of malignant neoplasm of breast: Secondary | ICD-10-CM

## 2023-03-16 DIAGNOSIS — Z836 Family history of other diseases of the respiratory system: Secondary | ICD-10-CM

## 2023-03-16 LAB — COMPREHENSIVE METABOLIC PANEL
ALT: 28 U/L (ref 0–44)
AST: 49 U/L — ABNORMAL HIGH (ref 15–41)
Albumin: 3.4 g/dL — ABNORMAL LOW (ref 3.5–5.0)
Alkaline Phosphatase: 86 U/L (ref 38–126)
Anion gap: 25 — ABNORMAL HIGH (ref 5–15)
BUN: 26 mg/dL — ABNORMAL HIGH (ref 6–20)
CO2: 19 mmol/L — ABNORMAL LOW (ref 22–32)
Calcium: 9.2 mg/dL (ref 8.9–10.3)
Chloride: 115 mmol/L — ABNORMAL HIGH (ref 98–111)
Creatinine, Ser: 1.53 mg/dL — ABNORMAL HIGH (ref 0.44–1.00)
GFR, Estimated: 39 mL/min — ABNORMAL LOW (ref >60)
Glucose, Bld: 134 mg/dL — ABNORMAL HIGH (ref 70–99)
Potassium: 4.7 mmol/L (ref 3.5–5.1)
Sodium: 159 mmol/L — ABNORMAL HIGH (ref 135–145)
Total Bilirubin: 2.9 mg/dL — ABNORMAL HIGH (ref <1.2)
Total Protein: 7.5 g/dL (ref 6.5–8.1)

## 2023-03-16 LAB — RAPID URINE DRUG SCREEN, HOSP PERFORMED
Amphetamines: NOT DETECTED
Barbiturates: NOT DETECTED
Benzodiazepines: POSITIVE — AB
Cocaine: NOT DETECTED
Opiates: NOT DETECTED
Tetrahydrocannabinol: NOT DETECTED

## 2023-03-16 LAB — I-STAT CHEM 8, ED
BUN: 43 mg/dL — ABNORMAL HIGH (ref 6–20)
Calcium, Ion: 1.03 mmol/L — ABNORMAL LOW (ref 1.15–1.40)
Chloride: 118 mmol/L — ABNORMAL HIGH (ref 98–111)
Creatinine, Ser: 1.1 mg/dL — ABNORMAL HIGH (ref 0.44–1.00)
Glucose, Bld: 132 mg/dL — ABNORMAL HIGH (ref 70–99)
HCT: 46 % (ref 36.0–46.0)
Hemoglobin: 15.6 g/dL — ABNORMAL HIGH (ref 12.0–15.0)
Potassium: 5.4 mmol/L — ABNORMAL HIGH (ref 3.5–5.1)
Sodium: 158 mmol/L — ABNORMAL HIGH (ref 135–145)
TCO2: 24 mmol/L (ref 22–32)

## 2023-03-16 LAB — CBC WITH DIFFERENTIAL/PLATELET
Abs Immature Granulocytes: 0.08 10*3/uL — ABNORMAL HIGH (ref 0.00–0.07)
Basophils Absolute: 0.1 10*3/uL (ref 0.0–0.1)
Basophils Relative: 0 %
Eosinophils Absolute: 0 10*3/uL (ref 0.0–0.5)
Eosinophils Relative: 0 %
HCT: 46.6 % — ABNORMAL HIGH (ref 36.0–46.0)
Hemoglobin: 15.1 g/dL — ABNORMAL HIGH (ref 12.0–15.0)
Immature Granulocytes: 1 %
Lymphocytes Relative: 9 %
Lymphs Abs: 1.2 10*3/uL (ref 0.7–4.0)
MCH: 26.4 pg (ref 26.0–34.0)
MCHC: 32.4 g/dL (ref 30.0–36.0)
MCV: 81.3 fL (ref 80.0–100.0)
Monocytes Absolute: 0.9 10*3/uL (ref 0.1–1.0)
Monocytes Relative: 6 %
Neutro Abs: 11.6 10*3/uL — ABNORMAL HIGH (ref 1.7–7.7)
Neutrophils Relative %: 84 %
Platelets: 312 10*3/uL (ref 150–400)
RBC: 5.73 MIL/uL — ABNORMAL HIGH (ref 3.87–5.11)
RDW: 14.8 % (ref 11.5–15.5)
WBC: 13.7 10*3/uL — ABNORMAL HIGH (ref 4.0–10.5)
nRBC: 0 % (ref 0.0–0.2)

## 2023-03-16 LAB — I-STAT CG4 LACTIC ACID, ED
Lactic Acid, Venous: 4.1 mmol/L (ref 0.5–1.9)
Lactic Acid, Venous: 1.3 mmol/L (ref 0.5–1.9)

## 2023-03-16 LAB — CBG MONITORING, ED: Glucose-Capillary: 124 mg/dL — ABNORMAL HIGH (ref 70–99)

## 2023-03-16 LAB — URINALYSIS, W/ REFLEX TO CULTURE (INFECTION SUSPECTED)
Glucose, UA: 50 mg/dL — AB
Hgb urine dipstick: NEGATIVE
Ketones, ur: 20 mg/dL — AB
Leukocytes,Ua: NEGATIVE
Nitrite: NEGATIVE
Protein, ur: 100 mg/dL — AB
Specific Gravity, Urine: 1.026 (ref 1.005–1.030)
pH: 5 (ref 5.0–8.0)

## 2023-03-16 LAB — RESP PANEL BY RT-PCR (RSV, FLU A&B, COVID)  RVPGX2
Influenza A by PCR: NEGATIVE
Influenza B by PCR: NEGATIVE
Resp Syncytial Virus by PCR: NEGATIVE
SARS Coronavirus 2 by RT PCR: NEGATIVE

## 2023-03-16 LAB — APTT: aPTT: 27 s (ref 24–36)

## 2023-03-16 LAB — CK: Total CK: 106 U/L (ref 38–234)

## 2023-03-16 LAB — TROPONIN I (HIGH SENSITIVITY): Troponin I (High Sensitivity): 145 ng/L (ref <18)

## 2023-03-16 LAB — ETHANOL: Alcohol, Ethyl (B): <10 mg/dL (ref <10)

## 2023-03-16 LAB — PROTIME-INR
INR: 1.6 — ABNORMAL HIGH (ref 0.8–1.2)
Prothrombin Time: 19.6 s — ABNORMAL HIGH (ref 11.4–15.2)

## 2023-03-16 MED ORDER — ORAL CARE MOUTH RINSE
15.0000 mL | OROMUCOSAL | Status: DC
  Administered 2023-03-17 – 2023-03-28 (×142): 15 mL via OROMUCOSAL

## 2023-03-16 MED ORDER — SODIUM CHLORIDE 0.9 % IV SOLN: INTRAVENOUS | Status: DC

## 2023-03-16 MED ORDER — DOCUSATE SODIUM 100 MG PO CAPS: 100.0000 mg | ORAL_CAPSULE | Freq: Two times a day (BID) | ORAL | Status: DC | PRN

## 2023-03-16 MED ORDER — VANCOMYCIN HCL IN DEXTROSE 1-5 GM/200ML-% IV SOLN: 1000.0000 mg | Freq: Once | INTRAVENOUS | Status: DC

## 2023-03-16 MED ORDER — LEVETIRACETAM IN NACL 1000 MG/100ML IV SOLN
1000.0000 mg | Freq: Two times a day (BID) | INTRAVENOUS | Status: DC
  Administered 2023-03-17: 1000 mg via INTRAVENOUS
  Filled 2023-03-16 (×3): qty 100

## 2023-03-16 MED ORDER — INSULIN ASPART 100 UNIT/ML IJ SOLN
0.0000 [IU] | Freq: Three times a day (TID) | INTRAMUSCULAR | Status: DC
  Administered 2023-03-17: 5 [IU] via SUBCUTANEOUS
  Administered 2023-03-17 (×2): 3 [IU] via SUBCUTANEOUS

## 2023-03-16 MED ORDER — VANCOMYCIN HCL 1.5 G IV SOLR
1500.0000 mg | Freq: Once | INTRAVENOUS | Status: AC
  Administered 2023-03-16: 1500 mg via INTRAVENOUS
  Filled 2023-03-16: qty 30

## 2023-03-16 MED ORDER — MIDAZOLAM HCL 2 MG/2ML IJ SOLN: 2.0000 mg | Freq: Once | INTRAMUSCULAR | Status: AC

## 2023-03-16 MED ORDER — SODIUM CHLORIDE 0.9 % IV SOLN
2.0000 g | Freq: Two times a day (BID) | INTRAVENOUS | Status: DC
  Administered 2023-03-17 – 2023-03-18 (×3): 2 g via INTRAVENOUS
  Filled 2023-03-16 (×3): qty 20

## 2023-03-16 MED ORDER — POLYETHYLENE GLYCOL 3350 17 G PO PACK: 17.0000 g | PACK | Freq: Every day | ORAL | Status: DC | PRN

## 2023-03-16 MED ORDER — MIDAZOLAM HCL 2 MG/2ML IJ SOLN
INTRAMUSCULAR | Status: AC
  Administered 2023-03-16: 4 mg via INTRAVENOUS
  Filled 2023-03-16: qty 4

## 2023-03-16 MED ORDER — ROCURONIUM BROMIDE 10 MG/ML (PF) SYRINGE
100.0000 mg | PREFILLED_SYRINGE | Freq: Once | INTRAVENOUS | Status: AC
  Administered 2023-03-16: 100 mg via INTRAVENOUS

## 2023-03-16 MED ORDER — SODIUM CHLORIDE 0.9 % IV SOLN
4500.0000 mg | Freq: Once | INTRAVENOUS | Status: AC
  Administered 2023-03-16: 4500 mg via INTRAVENOUS
  Filled 2023-03-16: qty 45

## 2023-03-16 MED ORDER — POLYETHYLENE GLYCOL 3350 17 G PO PACK
17.0000 g | PACK | Freq: Every day | ORAL | Status: DC
  Filled 2023-03-16 (×2): qty 1

## 2023-03-16 MED ORDER — ACETAMINOPHEN 10 MG/ML IV SOLN
1000.0000 mg | Freq: Four times a day (QID) | INTRAVENOUS | Status: AC
  Administered 2023-03-16 – 2023-03-17 (×3): 1000 mg via INTRAVENOUS
  Filled 2023-03-16 (×4): qty 100

## 2023-03-16 MED ORDER — DEXAMETHASONE SODIUM PHOSPHATE 10 MG/ML IJ SOLN
10.0000 mg | Freq: Four times a day (QID) | INTRAMUSCULAR | Status: DC
  Administered 2023-03-16 – 2023-03-18 (×6): 10 mg via INTRAVENOUS
  Filled 2023-03-16 (×7): qty 1

## 2023-03-16 MED ORDER — MIDAZOLAM-SODIUM CHLORIDE 100-0.9 MG/100ML-% IV SOLN
2.0000 mg/h | INTRAVENOUS | Status: DC
  Administered 2023-03-16: 2 mg/h via INTRAVENOUS
  Filled 2023-03-16: qty 100

## 2023-03-16 MED ORDER — KETAMINE HCL 50 MG/5ML IJ SOSY: 100.0000 mg | PREFILLED_SYRINGE | Freq: Once | INTRAMUSCULAR | Status: AC

## 2023-03-16 MED ORDER — MIDAZOLAM HCL 2 MG/2ML IJ SOLN: 4.0000 mg | Freq: Once | INTRAMUSCULAR | Status: DC

## 2023-03-16 MED ORDER — METRONIDAZOLE 500 MG/100ML IV SOLN
500.0000 mg | Freq: Once | INTRAVENOUS | Status: AC
  Administered 2023-03-16: 500 mg via INTRAVENOUS
  Filled 2023-03-16: qty 100

## 2023-03-16 MED ORDER — AZITHROMYCIN 500 MG IV SOLR
500.0000 mg | INTRAVENOUS | Status: DC
  Administered 2023-03-17 – 2023-03-18 (×2): 500 mg via INTRAVENOUS
  Filled 2023-03-16 (×3): qty 5

## 2023-03-16 MED ORDER — SODIUM CHLORIDE 0.9 % IV SOLN
2.0000 g | Freq: Once | INTRAVENOUS | Status: AC
  Administered 2023-03-16: 2 g via INTRAVENOUS
  Filled 2023-03-16: qty 10

## 2023-03-16 MED ORDER — VANCOMYCIN HCL 750 MG/150ML IV SOLN
750.0000 mg | Freq: Two times a day (BID) | INTRAVENOUS | Status: DC
  Administered 2023-03-17 (×2): 750 mg via INTRAVENOUS
  Filled 2023-03-16 (×3): qty 150

## 2023-03-16 MED ORDER — ORAL CARE MOUTH RINSE
15.0000 mL | OROMUCOSAL | Status: DC | PRN
Start: 2023-03-16 — End: 2023-03-28

## 2023-03-16 MED ORDER — KETAMINE HCL 50 MG/5ML IJ SOSY
PREFILLED_SYRINGE | INTRAMUSCULAR | Status: AC
  Administered 2023-03-16: 50 mg via INTRAVENOUS
  Filled 2023-03-16: qty 10

## 2023-03-16 MED ORDER — SODIUM CHLORIDE 0.9 % IV SOLN
250.0000 mL | INTRAVENOUS | Status: AC
  Administered 2023-03-16: 250 mL via INTRAVENOUS

## 2023-03-16 MED ORDER — FAMOTIDINE 20 MG PO TABS: 20.0000 mg | ORAL_TABLET | Freq: Two times a day (BID) | ORAL | Status: DC

## 2023-03-16 MED ORDER — FENTANYL CITRATE PF 50 MCG/ML IJ SOSY
50.0000 ug | PREFILLED_SYRINGE | INTRAMUSCULAR | Status: DC | PRN
  Administered 2023-03-22: 100 ug via INTRAVENOUS
  Administered 2023-03-23 – 2023-03-24 (×3): 50 ug via INTRAVENOUS
  Filled 2023-03-16: qty 2
  Filled 2023-03-16 (×3): qty 1

## 2023-03-16 MED ORDER — SODIUM CHLORIDE 0.9 % IV SOLN: 2.0000 g | Freq: Two times a day (BID) | INTRAVENOUS | Status: DC

## 2023-03-16 MED ORDER — DOCUSATE SODIUM 50 MG/5ML PO LIQD
100.0000 mg | Freq: Two times a day (BID) | ORAL | Status: DC
  Administered 2023-03-17 – 2023-03-18 (×2): 100 mg
  Filled 2023-03-16 (×5): qty 10

## 2023-03-16 MED ORDER — NOREPINEPHRINE 4 MG/250ML-% IV SOLN
2.0000 ug/min | INTRAVENOUS | Status: DC
  Administered 2023-03-16: 10 ug/min via INTRAVENOUS

## 2023-03-16 MED ORDER — PROPOFOL 1000 MG/100ML IV EMUL
5.0000 ug/kg/min | INTRAVENOUS | Status: DC
  Administered 2023-03-16: 20 ug/kg/min via INTRAVENOUS
  Administered 2023-03-17: 35 ug/kg/min via INTRAVENOUS
  Filled 2023-03-16: qty 100

## 2023-03-16 MED ORDER — FAMOTIDINE IN NACL 20-0.9 MG/50ML-% IV SOLN
20.0000 mg | Freq: Two times a day (BID) | INTRAVENOUS | Status: DC
  Administered 2023-03-17 (×2): 20 mg via INTRAVENOUS
  Filled 2023-03-16 (×3): qty 50

## 2023-03-16 MED ORDER — ROCURONIUM BROMIDE 10 MG/ML (PF) SYRINGE: 100.0000 mg | PREFILLED_SYRINGE | Freq: Once | INTRAVENOUS | Status: DC

## 2023-03-16 MED ORDER — SODIUM CHLORIDE 0.9 % IV BOLUS (SEPSIS)
1000.0000 mL | Freq: Once | INTRAVENOUS | Status: AC
Start: 2023-03-16 — End: 2023-03-16
  Administered 2023-03-16: 1000 mL via INTRAVENOUS

## 2023-03-16 MED ORDER — FENTANYL CITRATE PF 50 MCG/ML IJ SOSY
50.0000 ug | PREFILLED_SYRINGE | INTRAMUSCULAR | Status: AC | PRN
  Administered 2023-03-19 – 2023-03-23 (×3): 50 ug via INTRAVENOUS
  Filled 2023-03-16 (×3): qty 1

## 2023-03-16 NOTE — ED Notes (Signed)
Only able to obtain one set of blood cultures due to patient being a difficult stick, MD Tegeler aware.

## 2023-03-16 NOTE — ED Notes (Signed)
Patient currently at MRI

## 2023-03-16 NOTE — ED Notes (Signed)
Unable to start 2nd IV and collect blood specimen , IV team notified .

## 2023-03-16 NOTE — Progress Notes (Addendum)
Pharmacy Antibiotic Note  Audrey Turner is a 62 y.o. female admitted on 03/16/2023 with altered mental status/ concerns for pneumonia and meningitis. Pharmacy has been consulted for vancomycin dosing.  -WBC 13.7, sCr 1.10, lactate 1.3, Tmax 103 -Received One dose of aztreonam, vancomycin, flagyl in ED -Blood cultures collected  Plan: -Ceftriaxone 2g IV every 12 hours -Vancomycin 2g IV x1 -Vancomycin 750mg  IV every 12 hours (trough levels 15-20) -Azithromycin 500mg  IV every 24 hours  -Monitor renal function -Follow up signs of clinical improvement, LOT, de-escalation of antibiotics -Follow up lumbar puncture and respiratory culture -Follow need for listeria cvg given PCN allergy (rash only), discussed transition to meropenem and vancomycin if need be   Temp (24hrs), Avg:100.3 F (37.9 C), Min:97.5 F (36.4 C), Max:103.1 F (39.5 C)  Recent Labs  Lab 03/16/23 1955 03/16/23 2000 03/16/23 2001 03/16/23 2312  WBC 13.7*  --   --   --   CREATININE 1.53* 1.10*  --   --   LATICACIDVEN  --   --  4.1* 1.3    Estimated Creatinine Clearance: 50.2 mL/min (A) (by C-G formula based on SCr of 1.1 mg/dL (H)).    Allergies  Allergen Reactions   Dilantin [Phenytoin Sodium Extended] Rash   Penicillins Rash    Antimicrobials this admission: Ceftriaxone 12/7 >>  Azithromycin 12/7 >>  Vancomycin 12/7 >>  Microbiology results: 12/7 BCx:  12/7 Sputum: to be collected 12/8 CSF Cx: to be collected   Thank you for allowing pharmacy to be a part of this patient's care.  Arabella Merles, PharmD. Clinical Pharmacist 03/16/2023 11:38 PM

## 2023-03-16 NOTE — Progress Notes (Signed)
ED Pharmacy Antibiotic Sign Off An antibiotic consult was received from an ED provider for vancomycin and aztreonam per pharmacy dosing for sepsis. A chart review was completed to assess appropriateness.  A single dose of aztreonam and flagyl  placed by the ED provider.   The following one time order(s) were placed per pharmacy consult:  vancomycin 1500 mg x 1 dose  Further antibiotic and/or antibiotic pharmacy consults should be ordered by the admitting provider if indicated.   Thank you for allowing pharmacy to be a part of this patient's care.   Delmar Landau, PharmD, BCPS 03/16/2023 7:55 PM ED Clinical Pharmacist -  [phone]

## 2023-03-16 NOTE — Progress Notes (Signed)
Pt being followed by [facility] for Sepsis protocol. 

## 2023-03-16 NOTE — Code Documentation (Signed)
Stroke Response Nurse Documentation Code Documentation  Camy Haslip is a 62 y.o. female arriving to [facility]  via [facility] on 12/7 with past medical hx of resection of frontal lobe brain tumor, seizures, HLD, depression. On No antithrombotic. Code stroke was activated by ED.   Patient from home where she was LKW at 1700 and now complaining of aphasia.   Stroke team at the bedside on patient arrival. Labs drawn and patient cleared for CT by Dr. Emeline General. Patient to CT with team. NIHSS 23, see documentation for details and code stroke times. Patient with decreased LOC, disoriented, not following commands, bilateral arm weakness, bilateral leg weakness, Global aphasia , and dysarthria  on exam. The following imaging was completed:  CT Head and MRI. Patient is not a candidate for IV Thrombolytic due to MRI neg for stroke. Patient is not a candidate for IR due to MRI neg for stroke.     Bedside handoff with ED RN Reita Cliche.    Rose Fillers  Rapid Response RN

## 2023-03-16 NOTE — ED Triage Notes (Signed)
Patient arrived with EMS from home , family reported possible partial seizure this evening , LSW Thursday 2pm , altered LOC/non verbal this evening , CBG= 166 , received Versed 5 mg  IM prior to arrival .

## 2023-03-16 NOTE — Consult Note (Signed)
NEUROLOGY CONSULT NOTE   Date of service: March 16, 2023 Patient Name: Audrey Turner MRN:  [id number] DOB:  [birth year] Chief Complaint: "encephalopathy, concern for aphasia" Requesting Provider: Heide Scales, *  History of Present Illness  Audrey Turner is a 62 y.o. 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 Hx of seizures on Keppra 1000mg  BID who has been having cold like symptoms that developed last week, trouble swallowing liquids over the weekend that progressed to trouble swallowing solids early this week. Has not been eating and drinking. Brother took her to PCP, tested for strep and was negative. Symptoms felt to be due to post nasal drip. Was able to eat soft foods like mashed potatos but not been able to tolerate liquids. She continued to decline. This PM ,was lethargic, moving her arms and legs less and generalized weakness. Around 5PM, noted to have difficulty moving her arms and legs and then around 630PM, not talking much and confused and starring off. Brother called EMS and she was brought in to the ED where a code stroke was activated for concern for aphasia.  EMS gave her 5mg  of Versed for ?seizure.  At baseline, has R sided weakness, some cognitive impairment, severe depression as a complication of her treatment for cancer.  LKW: 1700 Modified rankin score: 4-Needs assistance to walk and tend to bodily needs IV Thrombolysis: not offered, felt less likely to be stroke, likely metabolic encephalopathy vs seizure. EVT: not offered, low suspicion for stroke, likely metabolic encephalopathy vs seizure.   NIHSS components Score: Comment  1a Level of Conscious 0[]  1[]  2[x]  3[]      1b LOC Questions 0[]  1[]  2[x]       1c LOC Commands 0[]  1[]  2[x]       2 Best Gaze 0[x]  1[]  2[]       3 Visual 0[x]  1[]  2[]  3[]      4 Facial Palsy 0[x]  1[]  2[]  3[]      5a Motor Arm - left 0[]  1[]  2[]  3[x]  4[]  UN[]    5b Motor  Arm - Right 0[]  1[]  2[]  3[x]  4[]  UN[]    6a Motor Leg - Left 0[]  1[]  2[]  3[x]  4[]  UN[]    6b Motor Leg - Right 0[]  1[]  2[]  3[x]  4[]  UN[]    7 Limb Ataxia 0[x]  1[]  2[]  3[]  UN[]     8 Sensory 0[x]  1[]  2[]  UN[]      9 Best Language 0[]  1[]  2[]  3[x]      10 Dysarthria 0[]  1[]  2[x]  UN[]      11 Extinct. and Inattention 0[x]  1[]  2[]       TOTAL: 23      ROS  Unable to ascertain due to encephalopathy.  Past History   Past Medical History:  Diagnosis Date   Acute sinusitis, unspecified    Astrocytoma (HCC) 01/2006 /09/2006   grade 3 atrocytoma s/p chemo/radiation/surgery at [facility], in remission since at least 2011   Cancer [facility])    Brain    Diarrhea    Drug induced neutropenia(288.03)    Primary exertional headache    Recurrent depression (HCC)    Vaginitis     Past Surgical History:  Procedure Laterality Date   BRAIN SURGERY     [facility].   d&c miscarriage  1993   left frontal craniotomy w/ left subtotal resection of left frontal brain tumor  01/10/2006   [facility] brain tumor surgery  01/13/2006   NSVD x 3 - premie at 22 wks  resection residual tumor  10/08/2006   Dr. Zachery Conch: [facility]   skull surg  2009/2010/2012   3 times past brain surg to close up opening    Family History: Family History  Problem Relation Age of Onset   Hypertension Mother    Hypertension Father    COPD Father    Sleep apnea Father    Breast cancer Paternal Aunt        72's   Brain cancer Paternal Aunt    Ovarian cancer Maternal Aunt    Colon cancer Neg Hx     Social History  reports that she has never smoked. She has never used smokeless tobacco. She reports that she does not drink alcohol and does not use drugs.  Allergies  Allergen Reactions   Dilantin [Phenytoin Sodium Extended] Rash   Penicillins Rash    Medications   Current Facility-Administered Medications:    acetaminophen (OFIRMEV) IV 1,000 mg, 1,000 mg, Intravenous, Q6H, Rolla Flatten, MD   aztreonam (AZACTAM) 2 g in sodium  chloride 0.9 % 100 mL IVPB, 2 g, Intravenous, Once, Rolla Flatten, MD   levETIRAcetam (KEPPRA) 4,500 mg in sodium chloride 0.9 % 250 mL IVPB, 4,500 mg, Intravenous, Once, Erick Blinks, MD   metroNIDAZOLE (FLAGYL) IVPB 500 mg, 500 mg, Intravenous, Once, Rolla Flatten, MD   sodium chloride 0.9 % bolus 1,000 mL, 1,000 mL, Intravenous, Once, Rolla Flatten, MD   Vancomycin (VANCOCIN) 1,500 mg in sodium chloride 0.9 % 500 mL IVPB, 1,500 mg, Intravenous, Once, Tegeler, Canary Brim, MD  Current Outpatient Medications:    Amantadine HCl 100 MG tablet, Take 1 tablet (100 mg total) by mouth 2 (two) times daily., Disp: , Rfl:    ARIPiprazole (ABILIFY) 5 MG tablet, Take 2.5 mg by mouth daily., Disp: , Rfl:    desvenlafaxine (PRISTIQ) 50 MG 24 hr tablet, Take 50 mg by mouth daily., Disp: , Rfl:    levETIRAcetam (KEPPRA) 100 MG/ML solution, TAKE 10 MLS (1,000 MG TOTAL) BY MOUTH 2 (TWO) TIMES DAILY. SHAKE WELL PRIOR TO ADMINISTERING., Disp: 1800 mL, Rfl: 1   MELATONIN KIDS PO, Take by mouth., Disp: , Rfl:    Multiple Vitamin (MULTIVITAMIN) capsule, Take 1 capsule by mouth daily., Disp: , Rfl:   Vitals   Vitals:   03/16/23 1924  BP: (!) 145/78  Pulse: (!) 146  Resp: (!) 46  Temp: (!) 103.1 F (39.5 C)  TempSrc: Axillary  SpO2: 93%    There is no height or weight on file to calculate BMI.  Physical Exam   General: Laying comfortably in bed; in no acute distress.  HENT: dry oropharynx and mucosa. Normal external appearance of ears and nose.  Neck: Supple, no pain or tenderness  CV: No JVD. No peripheral edema.  Pulmonary: Symmetric Chest rise. Normal respiratory effort.  Abdomen: Soft to touch, non-tender.  Ext: No cyanosis, edema, or deformity  Skin: No rash. Normal palpation of skin.   Musculoskeletal: Normal digits and nails by inspection. No clubbing.   Neurologic Examination  Mental status/Cognition: eyes open, starring off, does not answer any orientation  questions. Speech/language: mute, no speech throughout the encounter. Cranial nerves:   CN II Pupils equal and reactive to light, blinks to threat BL   CN III,IV,VI EOMI intact to dolls eyes.   CN V Corneals intact BL   CN VII Symmetric facial grimace to noxious stimuli   CN VIII Does not make eye contact to speech   CN IX & X Protecting her airway  CN XI Hed midline, no nuchal rigidity   CN XII Tongue midline.   Sensory/Motor:  Muscle bulk: poor, tone slightly increased in all extremities. Some spontaneous movement noted in all extremities. No antigravity movements thou. Withdraws to pinch in all extremities.  Coordination/Complex Motor:  Unable to assess.  Labs/Imaging/Neurodiagnostic studies   CBC:  Recent Labs  Lab March 31, 2023 1955 03-31-2023 2000  WBC 13.7*  --   NEUTROABS 11.6*  --   HGB 15.1* 15.6*  HCT 46.6* 46.0  MCV 81.3  --   PLT 312  --    Basic Metabolic Panel:  Lab Results  Component Value Date   NA 158 (H) 03/31/2023   K 5.4 (H) 03-31-2023   CO2 34 (H) 05/30/2021   GLUCOSE 132 (H) Mar 31, 2023   BUN 43 (H) 31-Mar-2023   CREATININE 1.10 (H) 03-31-23   CALCIUM 9.1 05/30/2021   GFRNONAA >60 01/06/2019   GFRAA >60 01/06/2019   Lipid Panel:  Lab Results  Component Value Date   LDLCALC 94 05/30/2021   HgbA1c:  Lab Results  Component Value Date   HGBA1C 4.9 05/30/2021   Urine Drug Screen:     Component Value Date/Time   LABOPIA NONE DETECTED 12/09/2015 1352   COCAINSCRNUR NONE DETECTED 12/09/2015 1352   LABBENZ NONE DETECTED 12/09/2015 1352   AMPHETMU NONE DETECTED 12/09/2015 1352   THCU NONE DETECTED 12/09/2015 1352   LABBARB NONE DETECTED 12/09/2015 1352    Alcohol Level     Component Value Date/Time   ETH <10 Mar 31, 2023 1955   INR  Lab Results  Component Value Date   INR 1.6 (H) March 31, 2023   APTT  Lab Results  Component Value Date   APTT 27 2023-03-31   AED levels: No results found for: "PHENYTOIN", "ZONISAMIDE", "LAMOTRIGINE",  "LEVETIRACETA"  CT Head without contrast(Personally reviewed): CTH was negative for a large hypodensity concerning for a large territory infarct or hyperdensity concerning for an ICH  MRI Brain(Personally reviewed): No acute stroke  Neurodiagnostics cEEG:  pending  ASSESSMENT   Audrey Turner is a 62 y.o. 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 Hx of seizures on Keppra 1000mg  BID who has been having cold like symptoms that developed last week, trouble swallowing liquids over the weekend that progressed to trouble swallowing solids early this week. Generalized weakness in the afternoon today with lethargy and decreased speech outpatient and not talking here and starring off. A code stroke was activated. She was not deemed a candidate for tnkase or thrombectomy due to low suspicion for stroke and MRI brain negative for stroke. The noted metabolic derangement probably explains her encephalopathy. I do suspect potential breakthrough seizure given noted starring off in the ED. Suspect this is due to inability to take Keppra in the setting of poor PO intake from trouble swallowing. Meningitis is another consideration but she does not have any nuchal rigidity. If there is no other obvious source of infection, may need to consider LP but I think the combination of reported URI symptoms and dehydration potentially explain her presentation at this time.  RECOMMENDATIONS  - Overnight LTM EEG - Keppra 4500mg  Iv once, resume Keppra 1000mg  BID. - seizure precautions. - treatment of note electrolyte derangements per primary team. ______________________________________________________________________  This patient is critically ill and at significant risk of neurological worsening, death and care requires constant monitoring of vital signs, hemodynamics,respiratory and cardiac monitoring, neurological assessment, discussion with family, other  specialists and medical decision making of  high complexity. I spent 60 minutes of neurocritical care time  in the care of  this patient. This was time spent independent of any time provided by nurse practitioner or PA.  Erick Blinks [facility] 03/16/2023  10:54 PM   Signed, Erick Blinks, MD [facility]

## 2023-03-16 NOTE — ED Notes (Signed)
IV nurse at bedside , delay in CT ; IV antibiotics and blood  tests due to difficult venipuncture despite multiple attempts . MD notified .

## 2023-03-16 NOTE — H&P (Signed)
NAME:  Audrey Turner, MRN:  [id number], DOB:  11-Sep-1960, LOS: 0 ADMISSION DATE:  03/16/2023, CONSULTATION DATE:  12/7 REFERRING MD:  remy, CHIEF COMPLAINT:  acute encephalopathy    History of Present Illness:  62 year old female who is w/c bound at baseline w/ RUE contracted and RLE weakness but oriented x3 after neurosurgery in 2007.  Lives w/ and cared for by brother [name] 5-7d ago was having some trouble swallowing.  Following that over the following days was having runny nose, sore throat and congestions. Her speech quality worsened her brother though laryngitis. Over last 2d PTA decreased PO intake, having more trouble swallowing, not able to take in all her Keppra over last 24 hrs PTA. On 12/7 was at home w/ brother. He thought he witnessed a seizure and called EMS. She received versed on way to ER.  On arrival to ER pt was unresponsive w/ left gaze pref.  Initial ER eval  CT and MRI brain negative for acute changes. Did show remote craniectomy w/ex vacuo dilatation of left lateral ventricle She was loaded w/ Keppra Initial labs: cbg 124, INR 1.6, Na 159, CO2 19, BUN 26, cr 1.53, Trop 145. Lactate 4.1 Neg COVID Blood cultures sent Antibiotics started.  On CCM arrival still not responsive. Rigid w/ possible nuchal rigidity. Left gaze pref. 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  History of seizures on Keppra 100 mg p.o. twice daily. Depression At baseline in [facility] since surgery  Full fxn on left. Able to stand and pivot but weak right leg. Contracted right arm. She is A&O at baseline but needs assist w/ ADLs including eating Significant Hospital Events: Including procedures, antibiotic start and stop dates in addition to other pertinent events   12/7 admitted w/  sepsis and acute metabolic enceph w/ break thru seizures, PNA and possible meningitis started on vanc, ceftriaxone and azith also decadron   Interim History / Subjective:  Now sedated on vent  Objective   Blood pressure (!) 153/64, pulse (!) 125, temperature (!) 97.5 F (36.4 C), temperature source Oral, resp. rate (!) 36, SpO2 99%.       No intake or output data in the 24 hours ending 03/16/23 2229 There were no vitals filed for this visit.  Examination: General: chronically ill appearing 62 year old female now intubated HENT: left gaze pref. Alopecia, PERRL now OETT w 7.5 ett post intubation film w/ evolving RLL infiltrate Lungs: scattered rhonchi, diffusely  Cardiovascular: tachy rrr Abdomen: soft  Extremities: warm mottled palp pulses. But thready. 4+ pitting edema Neuro: no response to  GU: due to void   Resolved Hospital Problem list     Assessment & Plan:  Acute metabolic encephalopathy multifactorial due to sepsis, status epilepticus and possibly meningitis Plan AEDs per below.  LP once in ICU  Broad spec abx per below  Supportive care PAD protocol RASS -3 to -4 as directed by LTM  Status epilepticus prior h/o astrocytoma s/p resxn 2007 Plan Cont keppra as directed by neuro LTM Seizure precautions Cont Prop and versed. Additional titration TBD by LTM  Ventilator management  Intubated for airway protection  Plan Full vent support Adjust Ve  VAP bundle PAD as above Am cxr 12/8  Right lower lobe PNA (CAP vs aspiration)  Plan Respiratory culture  Send u strep, legionella antigen as well as Resp virus panel Vanc/ceftriaxone and azith started in ER 12/7  Sepsis w/ lactic acidosis 2/2 PNA vs meningitis  Lactate has cleared Plan Pan culture  Abx as above  Cont IVFs MAP goal > 65  Fluid and electrolyte imbalance: hypernatremia, hyperkalemia followed by hypokalemia Now s/p volume resuscitation  Plan Cont IVFs Repeating CMP (all labs via Istat so not  clear of accuracy   AKI secondary to sepsis  Plan Cont IVFs Renal dose meds  Strict I&O Am chem   Mild coagulopathy  INR 1.6 Plan Trend   Mild elevation of troponin Plan Cont tele 12 lead in am  Consider ECHO    Best Practice (right click and "Reselect all SmartList Selections" daily)   Diet/type: NPO DVT prophylaxis SCD Pressure ulcer(s): N/A GI prophylaxis: H2B Lines: N/A Foley:  Yes, and it is still needed Code Status:  full code Last date of multidisciplinary goals of care discussion [brother [name] at bedside. Requested full code and aggressive are]  Labs   CBC: Recent Labs  Lab 03/16/23 1955 03/16/23 2000  WBC 13.7*  --   NEUTROABS 11.6*  --   HGB 15.1* 15.6*  HCT 46.6* 46.0  MCV 81.3  --   PLT 312  --     Basic Metabolic Panel: Recent Labs  Lab 03/16/23 1955 03/16/23 2000  NA 159* 158*  K 4.7 5.4*  CL 115* 118*  CO2 19*  --   GLUCOSE 134* 132*  BUN 26* 43*  CREATININE 1.53* 1.10*  CALCIUM 9.2  --    GFR: Estimated Creatinine Clearance: 50.2 mL/min (A) (by C-G formula based on SCr of 1.1 mg/dL (H)). Recent Labs  Lab 03/16/23 1955 03/16/23 2001  WBC 13.7*  --   LATICACIDVEN  --  4.1*    Liver Function Tests: Recent Labs  Lab 03/16/23 1955  AST 49*  ALT 28  ALKPHOS 86  BILITOT 2.9*  PROT 7.5  ALBUMIN 3.4*   No results for input(s): "LIPASE", "AMYLASE" in the last 168 hours. No results for input(s): "AMMONIA" in the last 168 hours.  ABG    Component Value Date/Time   TCO2 24 03/16/2023 2000     Coagulation Profile: Recent Labs  Lab 03/16/23 1955  INR 1.6*    Cardiac Enzymes: No results for input(s): "CKTOTAL", "CKMB", "CKMBINDEX", "TROPONINI" in the last 168 hours.  HbA1C: Hgb A1c MFr Bld  Date/Time Value Ref Range Status  05/30/2021 11:51 AM 4.9 4.6 - 6.5 % Final    Comment:    Glycemic Control Guidelines for People with Diabetes:Non Diabetic:  <6%Goal of Therapy: <7%Additional Action Suggested:  >8%    08/07/2019 12:06 PM 4.9 4.6 - 6.5 % Final    Comment:    Glycemic Control Guidelines for People with Diabetes:Non Diabetic:  <6%Goal of Therapy: <7%Additional Action Suggested:  >8%     CBG: Recent Labs  Lab 03/16/23 1937  GLUCAP 124*    Review of Systems:   Not able   Past Medical History:  She,  has a past medical history of Acute sinusitis, unspecified, Astrocytoma (HCC) (01/2006 /09/2006), Cancer (HCC), Diarrhea, Drug induced neutropenia(288.03), Primary exertional headache, Recurrent depression (HCC), and Vaginitis.   Surgical History:   Past Surgical History:  Procedure Laterality Date   BRAIN SURGERY     [facility].   d&c miscarriage  1993   left frontal craniotomy w/ left subtotal resection of left frontal brain tumor  01/10/2006   [facility]  brain tumor surgery  01/13/2006   NSVD x 3 - premie at 22 wks     resection residual tumor  10/08/2006   Dr. Zachery Conch: [facility]   skull surg  2009/2010/2012   3 times past brain surg to close up opening     Social History:   reports that she has never smoked. She has never used smokeless tobacco. She reports that she does not drink alcohol and does not use drugs.   Family History:  Her family history includes Brain cancer in her paternal aunt; Breast cancer in her paternal aunt; COPD in her father; Hypertension in her father and mother; Ovarian cancer in her maternal aunt; Sleep apnea in her father. There is no history of Colon cancer.   Allergies Allergies  Allergen Reactions   Dilantin [Phenytoin Sodium Extended] Rash   Penicillins Rash     Home Medications  Prior to Admission medications   Medication Sig Start Date End Date Taking? Authorizing Provider  Amantadine HCl 100 MG tablet Take 1 tablet (100 mg total) by mouth 2 (two) times daily. 03/14/23   Joaquim Nam, MD  ARIPiprazole (ABILIFY) 5 MG tablet Take 2.5 mg by mouth daily. 03/29/16   [provider]  desvenlafaxine (PRISTIQ) 50 MG 24 hr tablet Take 50  mg by mouth daily.    [provider]  levETIRAcetam (KEPPRA) 100 MG/ML solution TAKE 10 MLS (1,000 MG TOTAL) BY MOUTH 2 (TWO) TIMES DAILY. SHAKE WELL PRIOR TO ADMINISTERING. 03/05/23   Van Clines, MD  MELATONIN KIDS PO Take by mouth.    [provider]  Multiple Vitamin (MULTIVITAMIN) capsule Take 1 capsule by mouth daily.    [provider]     Critical care time: 42 min    Simonne Martinet ACNP-BC [facility] Pulmonary/Critical Care Pager # [phone] OR # [phone] if no answer

## 2023-03-16 NOTE — ED Provider Notes (Signed)
[place] EMERGENCY DEPARTMENT AT [facility] Provider Note   CSN: [id number] Arrival date & time: 03/16/23  1924     History  Chief Complaint  Patient presents with   Altered LOC / ? Seizure    Audrey Turner is a 62 y.o. female.  62 year old female with past medical history of astrocytoma s/p chemo/radiation and surgery, seizures on Keppra presents here for altered mental status.  Has been progressively worsening over the last week.  Her brother, who is her primary caretaker, provides a history.  He states that she has had poor oral intake that has been worsening over the last week.  She has been able to take and only minimal fluids.  She has not had any fevers that he is aware of.  At her typical baseline, she is conversant.  She is able to ambulate.  Has chronic right upper extremity residual weakness related to her prior intracranial malignancy.  Today, patient abruptly was unable to communicate.  Previously, her brother states that she was still whispering verbal responses.  However, this changed this afternoon, which is what prompted him to call EMS.  EMS reports observing possible seizure-like activity as patient does have a history of absence type seizures.  They administered 5 mg of Versed in the field.  Patient's respiratory status subsequently worsened.  They provided respiratory assistance with supplemental oxygen and BVM as needed.  However, patient maintained her respiratory drive.  She arrives here, unable to converse.  However, is awake.  The history is provided by the EMS personnel, a caregiver and medical records.       Home Medications Prior to Admission medications   Medication Sig Start Date End Date Taking? Authorizing Provider  Amantadine HCl 100 MG tablet Take 1 tablet (100 mg total) by mouth 2 (two) times daily. 03/14/23   Joaquim Nam, MD  ARIPiprazole (ABILIFY) 5 MG tablet Take 2.5 mg by mouth daily. 03/29/16   [provider]   desvenlafaxine (PRISTIQ) 50 MG 24 hr tablet Take 50 mg by mouth daily.    [provider]  levETIRAcetam (KEPPRA) 100 MG/ML solution TAKE 10 MLS (1,000 MG TOTAL) BY MOUTH 2 (TWO) TIMES DAILY. SHAKE WELL PRIOR TO ADMINISTERING. 03/05/23   Van Clines, MD  MELATONIN KIDS PO Take by mouth.    [provider]  Multiple Vitamin (MULTIVITAMIN) capsule Take 1 capsule by mouth daily.    [provider]      Allergies    Dilantin [phenytoin sodium extended] and Penicillins    Review of Systems   Review of Systems  Unable to perform ROS: Mental status change    Physical Exam Updated Vital Signs BP (!) 181/81   Pulse (!) 121   Temp (!) 97.5 F (36.4 C) (Oral)   Resp (!) 34   SpO2 100%  Physical Exam Vitals reviewed.  Constitutional:      General: She is not in acute distress.    Appearance: She is ill-appearing. She is not toxic-appearing or diaphoretic.  HENT:     Head: Normocephalic.     Mouth/Throat:     Mouth: Mucous membranes are dry.  Eyes:     Conjunctiva/sclera: Conjunctivae normal.     Pupils: Pupils are equal, round, and reactive to light.  Cardiovascular:     Rate and Rhythm: Regular rhythm. Tachycardia present.     Heart sounds: Normal heart sounds. No murmur heard.    No friction rub. No gallop.  Pulmonary:  Effort: Pulmonary effort is normal. No respiratory distress.     Breath sounds: Normal breath sounds. No wheezing, rhonchi or rales.  Abdominal:     General: There is no distension.     Palpations: Abdomen is soft.     Tenderness: There is no abdominal tenderness. There is no guarding or rebound.  Skin:    General: Skin is warm and dry.  Neurological:     Mental Status: She is unresponsive.     Cranial Nerves: No facial asymmetry.     Comments: Blinks to threat.  Symmetrical appearance of face with grimace on sternal rub.     ED Results / Procedures / Treatments   Labs (all labs ordered are listed, but only abnormal  results are displayed) Labs Reviewed  PROTIME-INR - Abnormal; Notable for the following components:      Result Value   Prothrombin Time 19.6 (*)    INR 1.6 (*)    All other components within normal limits  COMPREHENSIVE METABOLIC PANEL - Abnormal; Notable for the following components:   Sodium 159 (*)    Chloride 115 (*)    CO2 19 (*)    Glucose, Bld 134 (*)    BUN 26 (*)    Creatinine, Ser 1.53 (*)    Albumin 3.4 (*)    AST 49 (*)    Total Bilirubin 2.9 (*)    GFR, Estimated 39 (*)    Anion gap 25 (*)    All other components within normal limits  RAPID URINE DRUG SCREEN, HOSP PERFORMED - Abnormal; Notable for the following components:   Benzodiazepines POSITIVE (*)    All other components within normal limits  CBC WITH DIFFERENTIAL/PLATELET - Abnormal; Notable for the following components:   WBC 13.7 (*)    RBC 5.73 (*)    Hemoglobin 15.1 (*)    HCT 46.6 (*)    Neutro Abs 11.6 (*)    Abs Immature Granulocytes 0.08 (*)    All other components within normal limits  URINALYSIS, W/ REFLEX TO CULTURE (INFECTION SUSPECTED) - Abnormal; Notable for the following components:   Color, Urine AMBER (*)    APPearance CLOUDY (*)    Glucose, UA 50 (*)    Bilirubin Urine SMALL (*)    Ketones, ur 20 (*)    Protein, ur 100 (*)    Bacteria, UA MANY (*)    All other components within normal limits  I-STAT CHEM 8, ED - Abnormal; Notable for the following components:   Sodium 158 (*)    Potassium 5.4 (*)    Chloride 118 (*)    BUN 43 (*)    Creatinine, Ser 1.10 (*)    Glucose, Bld 132 (*)    Calcium, Ion 1.03 (*)    Hemoglobin 15.6 (*)    All other components within normal limits  I-STAT CG4 LACTIC ACID, ED - Abnormal; Notable for the following components:   Lactic Acid, Venous 4.1 (*)    All other components within normal limits  CBG MONITORING, ED - Abnormal; Notable for the following components:   Glucose-Capillary 124 (*)    All other components within normal limits  TROPONIN  I (HIGH SENSITIVITY) - Abnormal; Notable for the following components:   Troponin I (High Sensitivity) 145 (*)    All other components within normal limits  RESP PANEL BY RT-PCR (RSV, FLU A&B, COVID)  RVPGX2  CULTURE, BLOOD (ROUTINE X 2)  CULTURE, BLOOD (ROUTINE X 2)  CULTURE, RESPIRATORY W Romie Minus  STAIN  ETHANOL  APTT  COMPREHENSIVE METABOLIC PANEL  LACTIC ACID, PLASMA  LACTIC ACID, PLASMA  TRIGLYCERIDES  CK  HIV ANTIBODY (ROUTINE TESTING W REFLEX)  HEMOGLOBIN A1C  PROCALCITONIN  STREP PNEUMONIAE URINARY ANTIGEN  LEGIONELLA PNEUMOPHILA SEROGP 1 UR AG  BLOOD GAS, ARTERIAL  BASIC METABOLIC PANEL  BLOOD GAS, ARTERIAL  CBC  MAGNESIUM  PHOSPHORUS  I-STAT CG4 LACTIC ACID, ED  TROPONIN I (HIGH SENSITIVITY)    EKG EKG Interpretation Date/Time:  Saturday March 16 2023 19:26:19 EST Ventricular Rate:  146 PR Interval:  104 QRS Duration:  87 QT Interval:  296 QTC Calculation: 462 R Axis:   52  Text Interpretation: Sinus tachycardia Probable LVH with secondary repol abnrm when compared to prior, faster rate. No STEMI Confirmed by Theda Belfast ([id number]) on 03/16/2023 7:35:03 PM  Radiology DG Chest Portable 1 View  Result Date: 03/16/2023 CLINICAL DATA:  Concern for sepsis.  Partial seizure. EXAM: PORTABLE CHEST 1 VIEW COMPARISON:  07/03/2016. FINDINGS: The heart size and mediastinal contours are within normal limits. No consolidation, effusion, or pneumothorax. No acute osseous abnormality. IMPRESSION: No active disease. Electronically Signed   By: Thornell Sartorius M.D.   On: 03/16/2023 22:20   MR BRAIN WO CONTRAST  Result Date: 03/16/2023 CLINICAL DATA:  Encephalopathy EXAM: MRI HEAD WITHOUT CONTRAST TECHNIQUE: Multiplanar, multiecho pulse sequences of the brain and surrounding structures were obtained without intravenous contrast. COMPARISON:  01/06/2019 FINDINGS: Brain: No acute infarct, mass effect or extra-axial collection. Focus of hyperintensity at the lateral aspect of the left  precentral gyrus on diffusion-weighted imaging is artifactual due to magnetic susceptibility effects. Chronic hemosiderin over the anterior left hemisphere. No acute hemorrhage. Ex vacuo dilatation of the left lateral ventricle with mild lateral and third ventriculomegaly. There is confluent hyperintense T2-weighted signal within the periventricular and deep white matter. The midline structures are normal. Vascular: Normal flow voids. Skull and upper cervical spine: Remote left craniectomy Sinuses/Orbits:No paranasal sinus fluid levels or advanced mucosal thickening. No mastoid or middle ear effusion. Normal orbits. IMPRESSION: 1. No acute intracranial abnormality. 2. Remote left craniectomy with ex vacuo dilatation of the left lateral ventricle and mild lateral and third ventriculomegaly Electronically Signed   By: Deatra Robinson M.D.   On: 03/16/2023 20:49   CT HEAD CODE STROKE WO CONTRAST  Result Date: 03/16/2023 CLINICAL DATA:  Code stroke.  Acute neurologic deficit EXAM: CT HEAD WITHOUT CONTRAST TECHNIQUE: Contiguous axial images were obtained from the base of the skull through the vertex without intravenous contrast. RADIATION DOSE REDUCTION: This exam was performed according to the departmental dose-optimization program which includes automated exposure control, adjustment of the mA and/or kV according to patient size and/or use of iterative reconstruction technique. COMPARISON:  None Available. FINDINGS: Brain: Postsurgical changes of the left frontal lobe with ballooning of the frontal horn of the left lateral ventricle, unchanged. Progression of bilateral white matter hypoattenuation and left frontal encephalomalacia. No acute hemorrhage. Vascular: No abnormal hyperdensity of the major intracranial arteries or dural venous sinuses. No intracranial atherosclerosis. Skull: Remote left-sided craniectomy. Sinuses/Orbits: No fluid levels or advanced mucosal thickening of the visualized paranasal sinuses. No  mastoid or middle ear effusion. The orbits are normal. ASPECTS [facility] Stroke Program Early CT Score) - Ganglionic level infarction (caudate, lentiform nuclei, internal capsule, insula, M1-M3 cortex): 7 - Supraganglionic infarction (M4-M6 cortex): 3 Total score (0-10 with 10 being normal): 10 IMPRESSION: 1. No acute intracranial hemorrhage. 2. ASPECTS is 10. 3. Progression of bilateral white matter hypoattenuation and left  frontal encephalomalacia. These results were communicated to Dr. Erick Blinks at 8:21 pm on 03/16/2023 by text page via the [facility] messaging system. Electronically Signed   By: Deatra Robinson M.D.   On: 03/16/2023 20:24    Procedures Procedures    Medications Ordered in ED Medications  acetaminophen (OFIRMEV) IV 1,000 mg (0 mg Intravenous Stopped 03/16/23 2145)  Vancomycin (VANCOCIN) 1,500 mg in sodium chloride 0.9 % 500 mL IVPB (1,500 mg Intravenous New Bag/Given 03/16/23 2144)  0.9 %  sodium chloride infusion ( Intravenous New Bag/Given 03/16/23 2202)  midazolam (VERSED) injection 4 mg (has no administration in time range)  rocuronium (ZEMURON) injection 100 mg (has no administration in time range)  propofol (DIPRIVAN) 1000 MG/100ML infusion (20 mcg/kg/min  78 kg Intravenous New Bag/Given 03/16/23 2314)  midazolam (VERSED) 100 mg/100 mL (1 mg/mL) premix infusion (has no administration in time range)  docusate (COLACE) 50 MG/5ML liquid 100 mg (has no administration in time range)  polyethylene glycol (MIRALAX / GLYCOLAX) packet 17 g (has no administration in time range)  fentaNYL (SUBLIMAZE) injection 50 mcg (has no administration in time range)  fentaNYL (SUBLIMAZE) injection 50-200 mcg (has no administration in time range)  docusate sodium (COLACE) capsule 100 mg (has no administration in time range)  polyethylene glycol (MIRALAX / GLYCOLAX) packet 17 g (has no administration in time range)  insulin aspart (novoLOG) injection 0-15 Units (has no administration in time range)   0.9 %  sodium chloride infusion (has no administration in time range)  norepinephrine (LEVOPHED) 4mg  in (0.016 mg/mL) premix infusion (has no administration in time range)  levETIRAcetam (KEPPRA) IVPB 1000 mg/100 mL premix (has no administration in time range)  cefTRIAXone (ROCEPHIN) 2 g in sodium chloride 0.9 % 100 mL IVPB (has no administration in time range)  dexamethasone (DECADRON) injection 10 mg (has no administration in time range)  sodium chloride 0.9 % bolus 1,000 mL (0 mLs Intravenous Stopped 03/16/23 2140)  aztreonam (AZACTAM) 2 g in sodium chloride 0.9 % 100 mL IVPB (0 g Intravenous Stopped 03/16/23 2143)  metroNIDAZOLE (FLAGYL) IVPB 500 mg (0 mg Intravenous Stopped 03/16/23 2201)  levETIRAcetam (KEPPRA) 4,500 mg in sodium chloride 0.9 % 250 mL IVPB (0 mg Intravenous Stopped 03/16/23 2129)  midazolam (VERSED) injection 2 mg (4 mg Intravenous Given 03/16/23 2304)  ketamine 50 mg in normal saline 5 mL (10 mg/mL) syringe (50 mg Intravenous Given 03/16/23 2306)  rocuronium (ZEMURON) injection 100 mg (100 mg Intravenous Given 03/16/23 2317)    ED Course/ Medical Decision Making/ A&P Clinical Course as of 03/16/23 2321  Sat Mar 16, 2023  2312 EKG 12-Lead Sinus rhythm.  Rate of 146.  Normal intervals.  ST depressions in V4-V5.  New when compared to prior ECG. [JR]    Clinical Course User Index [JR] Rolla Flatten, MD                                 Medical Decision Making Amount and/or Complexity of Data Reviewed Independent Historian: caregiver and EMS    Details: Patient's brother Labs: ordered. Radiology: ordered and independent interpretation performed. ECG/medicine tests: ordered and independent interpretation performed. Decision-making details documented in ED Course.  Risk Prescription drug management. Decision regarding hospitalization.   62 year old female presents here for altered mental status.  On arrival, patient is awake but not responsive.  She is receiving  respiratory assistance with BVM.  Patient transition to 4 L nasal cannula  on arrival.  On arrival, patient noted to be febrile.  She is tachycardic.  She is not responsive.  However, grimaces to sternal rub.  Grimace is symmetrical.  She does blink to threat.  Makes some purposeful movements with squeezing of the left hand.  Reported history from EMS is concerning for potential stroke.  Code stroke activated.  Code sepsis also initiated.  Patient fluid resuscitated with 1 L bolus.  She does have some lower extremity edema.  Unknown cardiac function.  No prior echoes for review.  Given her respiratory status and unknown cardiac function but notable peripheral edema, we will hold off on full 30 cc/kg fluid bolus and initiate maintenance fluids at 150 cc/h.  Patient was empirically treated with broad-spectrum antibiotics.  Neurology was consulted due to concerns for stroke.  Neurology has reviewed the imaging.  No evidence of acute ischemia or hemorrhage on imaging.  They do recommend Keppra loading dose of 60 mg/kg due to concerns for potential seizure activity, which was administered.  Neurology also recommending admission for continuous EEG in the setting of potential ongoing seizures and status epilepticus.  Broad laboratory workup was obtained, including CMP, CBC, PT/INR, troponin, ethanol, lactic acid, UDS, UA.  Also obtain chest x-ray to evaluate for pneumonia in the setting of potential sepsis.  I independently reviewed the patient's imaging.  Chest x-ray does not appear consistent with pneumonia.  No evidence of intracranial hemorrhage or ischemia on neuroimaging.  I independently reviewed the patient's laboratory workup, which is notable for hypernatremia, AKI.  Likely consistent with acute dehydration.  May be contributing to metabolic encephalopathy.  Troponin is elevated at 145.  UA does not appear consistent with UTI.  Lactic acid is elevated at 4.1.  UDS positive for benzos, which were administered by  EMS.  Independently reviewed the patient's ECG, which does have some ischemic findings.  With elevation in her Theodis Aguas, this could reflect a type II NSTEMI in the setting of an acute illness.  Patient is felt to be critically ill.  Feel that she requires hospital admission for further evaluation of likely sepsis as well as continuous EEG for possible status epilepticus.  Critical care consulted.  I was notified the patient appeared to be having seizure-like activity with left-sided rigidity.  2 mg of IV Versed ordered.  I reassessed the patient.  Vitals are overall unchanged.  However, low threshold to treat for potential seizure.  Will continue with Versed.  Will continue to slowly rehydrate and correct hypernatremia with 150 cc per hour infusion of normal saline especially in the setting of possible compromised cardiac function and peripheral edema.  I have spoken with Dr. Larinda Buttery, to call care, who will admit the patient to the ICU.  Patient's presentation is most consistent with acute presentation with potential threat to life or bodily function.         Final Clinical Impression(s) / ED Diagnoses Final diagnoses:  Sepsis, due to unspecified organism, unspecified whether acute organ dysfunction present (HCC)  Hypernatremia  Altered mental status, unspecified altered mental status type    Rx / DC Orders ED Discharge Orders     None         Rolla Flatten, MD 03/16/23 2321    Tegeler, Canary Brim, MD 03/17/23 1550

## 2023-03-16 NOTE — ED Notes (Signed)
ED TO INPATIENT HANDOFF REPORT  ED Nurse Name and Phone #:  Lucious Groves [phone]  S Name/Age/Gender Audrey Turner 62 y.o. female Room/Bed: TRABC/TRABC  Code Status   Code Status: Not on file  Home/SNF/Other Home Patient oriented to: self, place, time, and situation Is this baseline? Yes   Triage Complete: Triage complete  Chief Complaint Unresponsive  Triage Note Patient arrived with EMS from home , family reported possible partial seizure this evening , LSW Thursday 2pm , altered LOC/non verbal this evening , CBG= 166 , received Versed 5 mg  IM prior to arrival .    Allergies Allergies  Allergen Reactions   Dilantin [Phenytoin Sodium Extended] Rash   Penicillins Rash    Level of Care/Admitting Diagnosis ED Disposition     ED Disposition  Admit   Condition  --   Comment  The patient appears reasonably stabilized for admission considering the current resources, flow, and capabilities available in the ED at this time, and I doubt any other [facility] requiring further screening and/or treatment in the ED prior to admission is  present.          B Medical/Surgery History Past Medical History:  Diagnosis Date   Acute sinusitis, unspecified    Astrocytoma (HCC) 01/2006 /09/2006   grade 3 atrocytoma s/p chemo/radiation/surgery at [facility], in remission since at least 2011   Cancer [facility])    Brain    Diarrhea    Drug induced neutropenia(288.03)    Primary exertional headache    Recurrent depression (HCC)    Vaginitis    Past Surgical History:  Procedure Laterality Date   BRAIN SURGERY     [facility].   d&c miscarriage  1993   left frontal craniotomy w/ left subtotal resection of left frontal brain tumor  01/10/2006   [facility] brain tumor surgery  01/13/2006   NSVD x 3 - premie at 22 wks     resection residual tumor  10/08/2006   Dr. Zachery Conch: [facility]   skull surg  2009/2010/2012   3 times past brain surg to close up opening     A IV  Location/Drains/Wounds Patient Lines/Drains/Airways Status     Active Line/Drains/Airways     Name Placement date Placement time Site Days   Peripheral IV 03/16/23 20 G Left Wrist 03/16/23  --  Wrist  less than 1   Peripheral IV 03/16/23 20 G Right Antecubital 03/16/23  1955  Antecubital  less than 1            Intake/Output Last 24 hours No intake or output data in the 24 hours ending 03/16/23 2151  Labs/Imaging Results for orders placed or performed during the hospital encounter of 03/16/23 (from the past 48 hour(s))  CBG monitoring, ED     Status: Abnormal   Collection Time: 03/16/23  7:37 PM  Result Value Ref Range   Glucose-Capillary 124 (H) 70 - 99 mg/dL    Comment: Glucose reference range applies only to samples taken after fasting for at least 8 hours.   Comment 1 Notify RN    Comment 2 Document in Chart   Ethanol     Status: None   Collection Time: 03/16/23  7:55 PM  Result Value Ref Range   Alcohol, Ethyl (B) <10 <10 mg/dL    Comment: (NOTE) Lowest detectable limit for serum alcohol is 10 mg/dL.  For medical purposes only. Performed at [facility], [address] [place], [place] [address]   Protime-INR  Status: Abnormal   Collection Time: 03/16/23  7:55 PM  Result Value Ref Range   Prothrombin Time 19.6 (H) 11.4 - 15.2 seconds   INR 1.6 (H) 0.8 - 1.2    Comment: (NOTE) INR goal varies based on device and disease states. Performed at [facility], [address] [place], [place] [address]   APTT     Status: None   Collection Time: 03/16/23  7:55 PM  Result Value Ref Range   aPTT 27 24 - 36 seconds    Comment: Performed at [facility], [address] [place], [place] [address]  Comprehensive metabolic panel     Status: Abnormal   Collection Time: 03/16/23  7:55 PM  Result Value Ref Range   Sodium 159 (H) 135 - 145 mmol/L   Potassium 4.7 3.5 - 5.1 mmol/L    Comment: HEMOLYSIS AT THIS LEVEL MAY AFFECT RESULT   Chloride 115  (H) 98 - 111 mmol/L   CO2 19 (L) 22 - 32 mmol/L   Glucose, Bld 134 (H) 70 - 99 mg/dL    Comment: Glucose reference range applies only to samples taken after fasting for at least 8 hours.   BUN 26 (H) 6 - 20 mg/dL   Creatinine, Ser 8.29 (H) 0.44 - 1.00 mg/dL   Calcium 9.2 8.9 - 56.2 mg/dL   Total Protein 7.5 6.5 - 8.1 g/dL   Albumin 3.4 (L) 3.5 - 5.0 g/dL   AST 49 (H) 15 - 41 U/L    Comment: HEMOLYSIS AT THIS LEVEL MAY AFFECT RESULT   ALT 28 0 - 44 U/L    Comment: HEMOLYSIS AT THIS LEVEL MAY AFFECT RESULT   Alkaline Phosphatase 86 38 - 126 U/L   Total Bilirubin 2.9 (H) <1.2 mg/dL    Comment: HEMOLYSIS AT THIS LEVEL MAY AFFECT RESULT   GFR, Estimated 39 (L) >60 mL/min    Comment: (NOTE) Calculated using the CKD-EPI Creatinine Equation (2021)    Anion gap 25 (H) 5 - 15    Comment: ELECTROLYTES REPEATED TO VERIFY Performed at [facility], [address] [place], [place] [address]   CBC with Differential     Status: Abnormal   Collection Time: 03/16/23  7:55 PM  Result Value Ref Range   WBC 13.7 (H) 4.0 - 10.5 K/uL   RBC 5.73 (H) 3.87 - 5.11 MIL/uL   Hemoglobin 15.1 (H) 12.0 - 15.0 g/dL   HCT 57.8 (H) 46.9 - 62.9 %   MCV 81.3 80.0 - 100.0 fL   MCH 26.4 26.0 - 34.0 pg   MCHC 32.4 30.0 - 36.0 g/dL   RDW 52.8 41.3 - 24.4 %   Platelets 312 150 - 400 K/uL   nRBC 0.0 0.0 - 0.2 %   Neutrophils Relative % 84 %   Neutro Abs 11.6 (H) 1.7 - 7.7 K/uL   Lymphocytes Relative 9 %   Lymphs Abs 1.2 0.7 - 4.0 K/uL   Monocytes Relative 6 %   Monocytes Absolute 0.9 0.1 - 1.0 K/uL   Eosinophils Relative 0 %   Eosinophils Absolute 0.0 0.0 - 0.5 K/uL   Basophils Relative 0 %   Basophils Absolute 0.1 0.0 - 0.1 K/uL   Immature Granulocytes 1 %   Abs Immature Granulocytes 0.08 (H) 0.00 - 0.07 K/uL    Comment: Performed at [facility], [address] [place], [place] [address]  Troponin I (High Sensitivity)     Status: Abnormal  Collection Time: 03/16/23  7:55 PM  Result Value  Ref Range   Troponin I (High Sensitivity) 145 (HH) <18 ng/L    Comment: CRITICAL RESULT CALLED TO, READ BACK BY AND VERIFIED WITH Ashwath Lasch, B. RN @ 2115 03/16/23 JBUTLER (NOTE) Elevated high sensitivity troponin I (hsTnI) values and significant  changes across serial measurements may suggest ACS but many other  chronic and acute conditions are known to elevate hsTnI results.  Refer to the "Links" section for chest pain algorithms and additional  guidance. Performed at [facility], [address], [place], [place] [address]   I-stat chem 8, ED     Status: Abnormal   Collection Time: 03/16/23  8:00 PM  Result Value Ref Range   Sodium 158 (H) 135 - 145 mmol/L   Potassium 5.4 (H) 3.5 - 5.1 mmol/L   Chloride 118 (H) 98 - 111 mmol/L   BUN 43 (H) 6 - 20 mg/dL   Creatinine, Ser 6.06 (H) 0.44 - 1.00 mg/dL   Glucose, Bld 301 (H) 70 - 99 mg/dL    Comment: Glucose reference range applies only to samples taken after fasting for at least 8 hours.   Calcium, Ion 1.03 (L) 1.15 - 1.40 mmol/L   TCO2 24 22 - 32 mmol/L   Hemoglobin 15.6 (H) 12.0 - 15.0 g/dL   HCT 60.1 09.3 - 23.5 %  I-Stat Lactic Acid, ED     Status: Abnormal   Collection Time: 03/16/23  8:01 PM  Result Value Ref Range   Lactic Acid, Venous 4.1 (HH) 0.5 - 1.9 mmol/L   Comment NOTIFIED PHYSICIAN   Urinalysis, w/ Reflex to Culture (Infection Suspected) -Urine, Clean Catch     Status: Abnormal   Collection Time: 03/16/23  9:02 PM  Result Value Ref Range   Specimen Source URINE, CLEAN CATCH    Color, Urine AMBER (A) YELLOW    Comment: BIOCHEMICALS MAY BE AFFECTED BY COLOR   APPearance CLOUDY (A) CLEAR   Specific Gravity, Urine 1.026 1.005 - 1.030   pH 5.0 5.0 - 8.0   Glucose, UA 50 (A) NEGATIVE mg/dL   Hgb urine dipstick NEGATIVE NEGATIVE   Bilirubin Urine SMALL (A) NEGATIVE   Ketones, ur 20 (A) NEGATIVE mg/dL   Protein, ur 573 (A) NEGATIVE mg/dL   Nitrite NEGATIVE NEGATIVE   Leukocytes,Ua NEGATIVE NEGATIVE   RBC / HPF  0-5 0 - 5 RBC/hpf   WBC, UA 6-10 0 - 5 WBC/hpf    Comment:        Reflex urine culture not performed if WBC <=10, OR if Squamous epithelial cells >5. If Squamous epithelial cells >5 suggest recollection.    Bacteria, UA MANY (A) NONE SEEN   Squamous Epithelial / HPF 0-5 0 - 5 /HPF   Mucus PRESENT    Hyaline Casts, UA PRESENT     Comment: Performed at [facility], [address] [place], [place] [address]   MR BRAIN WO CONTRAST  Result Date: 03/16/2023 CLINICAL DATA:  Encephalopathy EXAM: MRI HEAD WITHOUT CONTRAST TECHNIQUE: Multiplanar, multiecho pulse sequences of the brain and surrounding structures were obtained without intravenous contrast. COMPARISON:  01/06/2019 FINDINGS: Brain: No acute infarct, mass effect or extra-axial collection. Focus of hyperintensity at the lateral aspect of the left precentral gyrus on diffusion-weighted imaging is artifactual due to magnetic susceptibility effects. Chronic hemosiderin over the anterior left hemisphere. No acute hemorrhage. Ex vacuo dilatation of the left lateral ventricle with mild lateral and third ventriculomegaly. There is  confluent hyperintense T2-weighted signal within the periventricular and deep white matter. The midline structures are normal. Vascular: Normal flow voids. Skull and upper cervical spine: Remote left craniectomy Sinuses/Orbits:No paranasal sinus fluid levels or advanced mucosal thickening. No mastoid or middle ear effusion. Normal orbits. IMPRESSION: 1. No acute intracranial abnormality. 2. Remote left craniectomy with ex vacuo dilatation of the left lateral ventricle and mild lateral and third ventriculomegaly Electronically Signed   By: Deatra Robinson M.D.   On: 03/16/2023 20:49   CT HEAD CODE STROKE WO CONTRAST  Result Date: 03/16/2023 CLINICAL DATA:  Code stroke.  Acute neurologic deficit EXAM: CT HEAD WITHOUT CONTRAST TECHNIQUE: Contiguous axial images were obtained from the base of the skull through the vertex  without intravenous contrast. RADIATION DOSE REDUCTION: This exam was performed according to the departmental dose-optimization program which includes automated exposure control, adjustment of the mA and/or kV according to patient size and/or use of iterative reconstruction technique. COMPARISON:  None Available. FINDINGS: Brain: Postsurgical changes of the left frontal lobe with ballooning of the frontal horn of the left lateral ventricle, unchanged. Progression of bilateral white matter hypoattenuation and left frontal encephalomalacia. No acute hemorrhage. Vascular: No abnormal hyperdensity of the major intracranial arteries or dural venous sinuses. No intracranial atherosclerosis. Skull: Remote left-sided craniectomy. Sinuses/Orbits: No fluid levels or advanced mucosal thickening of the visualized paranasal sinuses. No mastoid or middle ear effusion. The orbits are normal. ASPECTS [facility] Stroke Program Early CT Score) - Ganglionic level infarction (caudate, lentiform nuclei, internal capsule, insula, M1-M3 cortex): 7 - Supraganglionic infarction (M4-M6 cortex): 3 Total score (0-10 with 10 being normal): 10 IMPRESSION: 1. No acute intracranial hemorrhage. 2. ASPECTS is 10. 3. Progression of bilateral white matter hypoattenuation and left frontal encephalomalacia. These results were communicated to Dr. Erick Blinks at 8:21 pm on 03/16/2023 by text page via the [facility] messaging system. Electronically Signed   By: Deatra Robinson M.D.   On: 03/16/2023 20:24    Pending Labs Unresulted Labs (From admission, onward)     Start     Ordered   03/16/23 1933  Resp panel by RT-PCR (RSV, Flu A&B, Covid) Urine, Clean Catch  (Septic presentation on arrival (screening labs, nursing and treatment orders for obvious sepsis))  Once,   URGENT        03/16/23 1935   03/16/23 1933  Blood Culture (routine x 2)  (Septic presentation on arrival (screening labs, nursing and treatment orders for obvious sepsis))  BLOOD CULTURE X  2,   STAT      03/16/23 1935   03/16/23 1932  Urine rapid drug screen (hosp performed)  Once,   STAT        03/16/23 1932            Vitals/Pain Today's Vitals   03/16/23 2000 03/16/23 2115 03/16/23 2145 03/16/23 2146  BP: (!) 155/78 (!) 169/73 (!) 153/64   Pulse: (!) 134 (!) 125    Resp: (!) 41 (!) 43 (!) 36   Temp:    (!) 97.5 F (36.4 C)  TempSrc:    Oral  SpO2: 92% 97%  99%  PainSc:        Isolation Precautions No active isolations  Medications Medications  metroNIDAZOLE (FLAGYL) IVPB 500 mg (500 mg Intravenous New Bag/Given 03/16/23 2116)  acetaminophen (OFIRMEV) IV 1,000 mg (0 mg Intravenous Stopped 03/16/23 2145)  Vancomycin (VANCOCIN) 1,500 mg in sodium chloride 0.9 % 500 mL IVPB (1,500 mg Intravenous New Bag/Given 03/16/23 2144)  0.9 %  sodium chloride infusion (has no administration in time range)  sodium chloride 0.9 % bolus 1,000 mL (0 mLs Intravenous Stopped 03/16/23 2140)  aztreonam (AZACTAM) 2 g in sodium chloride 0.9 % 100 mL IVPB (0 g Intravenous Stopped 03/16/23 2143)  levETIRAcetam (KEPPRA) 4,500 mg in sodium chloride 0.9 % 250 mL IVPB (0 mg Intravenous Stopped 03/16/23 2129)    Mobility non-ambulatory     Focused Assessments     R Recommendations: See Admitting Provider Note  Report given to:   Additional Notes:

## 2023-03-17 ENCOUNTER — Inpatient Hospital Stay (HOSPITAL_COMMUNITY): Payer: Self-pay

## 2023-03-17 ENCOUNTER — Inpatient Hospital Stay (HOSPITAL_COMMUNITY): Payer: Medicare Other

## 2023-03-17 DIAGNOSIS — J9601 Acute respiratory failure with hypoxia: Secondary | ICD-10-CM | POA: Insufficient documentation

## 2023-03-17 DIAGNOSIS — R578 Other shock: Secondary | ICD-10-CM

## 2023-03-17 DIAGNOSIS — J069 Acute upper respiratory infection, unspecified: Secondary | ICD-10-CM | POA: Insufficient documentation

## 2023-03-17 DIAGNOSIS — A419 Sepsis, unspecified organism: Secondary | ICD-10-CM

## 2023-03-17 LAB — POCT I-STAT 7, (LYTES, BLD GAS, ICA,H+H)
Acid-base deficit: 7 mmol/L — ABNORMAL HIGH (ref 0.0–2.0)
Acid-base deficit: 8 mmol/L — ABNORMAL HIGH (ref 0.0–2.0)
Bicarbonate: 15.6 mmol/L — ABNORMAL LOW (ref 20.0–28.0)
Bicarbonate: 16.7 mmol/L — ABNORMAL LOW (ref 20.0–28.0)
Calcium, Ion: 1.07 mmol/L — ABNORMAL LOW (ref 1.15–1.40)
Calcium, Ion: 1.1 mmol/L — ABNORMAL LOW (ref 1.15–1.40)
HCT: 28 % — ABNORMAL LOW (ref 36.0–46.0)
HCT: 30 % — ABNORMAL LOW (ref 36.0–46.0)
Hemoglobin: 10.2 g/dL — ABNORMAL LOW (ref 12.0–15.0)
Hemoglobin: 9.5 g/dL — ABNORMAL LOW (ref 12.0–15.0)
O2 Saturation: 100 %
O2 Saturation: 96 %
Patient temperature: 96.5
Patient temperature: 98.7
Potassium: 2.6 mmol/L — CL (ref 3.5–5.1)
Potassium: 3 mmol/L — ABNORMAL LOW (ref 3.5–5.1)
Sodium: 156 mmol/L — ABNORMAL HIGH (ref 135–145)
Sodium: 157 mmol/L — ABNORMAL HIGH (ref 135–145)
TCO2: 16 mmol/L — ABNORMAL LOW (ref 22–32)
TCO2: 18 mmol/L — ABNORMAL LOW (ref 22–32)
pCO2 arterial: 20.2 mm[Hg] — ABNORMAL LOW (ref 32–48)
pCO2 arterial: 29.6 mm[Hg] — ABNORMAL LOW (ref 32–48)
pH, Arterial: 7.358 (ref 7.35–7.45)
pH, Arterial: 7.491 — ABNORMAL HIGH (ref 7.35–7.45)
pO2, Arterial: 211 mm[Hg] — ABNORMAL HIGH (ref 83–108)
pO2, Arterial: 80 mm[Hg] — ABNORMAL LOW (ref 83–108)

## 2023-03-17 LAB — RESPIRATORY PANEL BY PCR

## 2023-03-17 LAB — MENINGITIS/ENCEPHALITIS PANEL (CSF)

## 2023-03-17 LAB — BASIC METABOLIC PANEL
Anion gap: 11 (ref 5–15)
BUN: 21 mg/dL — ABNORMAL HIGH (ref 6–20)
CO2: 19 mmol/L — ABNORMAL LOW (ref 22–32)
Calcium: 7.3 mg/dL — ABNORMAL LOW (ref 8.9–10.3)
Chloride: 124 mmol/L — ABNORMAL HIGH (ref 98–111)
Creatinine, Ser: 0.82 mg/dL (ref 0.44–1.00)
GFR, Estimated: >60 mL/min (ref >60)
Glucose, Bld: 219 mg/dL — ABNORMAL HIGH (ref 70–99)
Potassium: 3.6 mmol/L (ref 3.5–5.1)
Sodium: 154 mmol/L — ABNORMAL HIGH (ref 135–145)

## 2023-03-17 LAB — GLUCOSE, CAPILLARY
Glucose-Capillary: 112 mg/dL — ABNORMAL HIGH (ref 70–99)
Glucose-Capillary: 159 mg/dL — ABNORMAL HIGH (ref 70–99)
Glucose-Capillary: 165 mg/dL — ABNORMAL HIGH (ref 70–99)
Glucose-Capillary: 184 mg/dL — ABNORMAL HIGH (ref 70–99)
Glucose-Capillary: 188 mg/dL — ABNORMAL HIGH (ref 70–99)
Glucose-Capillary: 228 mg/dL — ABNORMAL HIGH (ref 70–99)
Glucose-Capillary: 81 mg/dL (ref 70–99)

## 2023-03-17 LAB — COMPREHENSIVE METABOLIC PANEL
ALT: 17 U/L (ref 0–44)
AST: 25 U/L (ref 15–41)
Albumin: 2.2 g/dL — ABNORMAL LOW (ref 3.5–5.0)
Alkaline Phosphatase: 55 U/L (ref 38–126)
Anion gap: 16 — ABNORMAL HIGH (ref 5–15)
BUN: 22 mg/dL — ABNORMAL HIGH (ref 6–20)
CO2: 16 mmol/L — ABNORMAL LOW (ref 22–32)
Calcium: 7.1 mg/dL — ABNORMAL LOW (ref 8.9–10.3)
Chloride: 123 mmol/L — ABNORMAL HIGH (ref 98–111)
Creatinine, Ser: 0.9 mg/dL (ref 0.44–1.00)
GFR, Estimated: >60 mL/min (ref >60)
Glucose, Bld: 173 mg/dL — ABNORMAL HIGH (ref 70–99)
Potassium: 2.4 mmol/L — CL (ref 3.5–5.1)
Sodium: 155 mmol/L — ABNORMAL HIGH (ref 135–145)
Total Bilirubin: 1.5 mg/dL — ABNORMAL HIGH (ref <1.2)
Total Protein: 4.7 g/dL — ABNORMAL LOW (ref 6.5–8.1)

## 2023-03-17 LAB — CBC WITH DIFFERENTIAL/PLATELET
Abs Immature Granulocytes: 0.03 10*3/uL (ref 0.00–0.07)
Basophils Absolute: 0 10*3/uL (ref 0.0–0.1)
Basophils Relative: 0 %
Eosinophils Absolute: 0 10*3/uL (ref 0.0–0.5)
Eosinophils Relative: 0 %
HCT: 30.7 % — ABNORMAL LOW (ref 36.0–46.0)
Hemoglobin: 9.9 g/dL — ABNORMAL LOW (ref 12.0–15.0)
Immature Granulocytes: 0 %
Lymphocytes Relative: 16 %
Lymphs Abs: 1.7 10*3/uL (ref 0.7–4.0)
MCH: 26.3 pg (ref 26.0–34.0)
MCHC: 32.2 g/dL (ref 30.0–36.0)
MCV: 81.4 fL (ref 80.0–100.0)
Monocytes Absolute: 0.6 10*3/uL (ref 0.1–1.0)
Monocytes Relative: 5 %
Neutro Abs: 8.3 10*3/uL — ABNORMAL HIGH (ref 1.7–7.7)
Neutrophils Relative %: 79 %
Platelets: 208 10*3/uL (ref 150–400)
RBC: 3.77 MIL/uL — ABNORMAL LOW (ref 3.87–5.11)
RDW: 14.5 % (ref 11.5–15.5)
WBC: 10.6 10*3/uL — ABNORMAL HIGH (ref 4.0–10.5)
nRBC: 0 % (ref 0.0–0.2)

## 2023-03-17 LAB — TROPONIN I (HIGH SENSITIVITY)
Troponin I (High Sensitivity): 87 ng/L — ABNORMAL HIGH (ref <18)
Troponin I (High Sensitivity): 185 ng/L (ref <18)

## 2023-03-17 LAB — PHOSPHORUS
Phosphorus: 1.6 mg/dL — ABNORMAL LOW (ref 2.5–4.6)
Phosphorus: 3.6 mg/dL (ref 2.5–4.6)

## 2023-03-17 LAB — CSF CELL COUNT WITH DIFFERENTIAL
RBC Count, CSF: 8 /mm3 — ABNORMAL HIGH
Tube #: 3
WBC, CSF: 2 /mm3 (ref 0–5)

## 2023-03-17 LAB — ECHOCARDIOGRAM COMPLETE
Area-P 1/2: 3.19 cm2
MV M vel: 3.57 m/s
MV Peak grad: 50.8 mm[Hg]
S' Lateral: 3 cm
Single Plane A4C EF: 58.8 %

## 2023-03-17 LAB — PROTEIN AND GLUCOSE, CSF
Glucose, CSF: 106 mg/dL — ABNORMAL HIGH (ref 40–70)
Total  Protein, CSF: 33 mg/dL (ref 15–45)

## 2023-03-17 LAB — TRIGLYCERIDES: Triglycerides: 125 mg/dL (ref <150)

## 2023-03-17 LAB — LACTIC ACID, PLASMA
Lactic Acid, Venous: 1.6 mmol/L (ref 0.5–1.9)
Lactic Acid, Venous: 1.4 mmol/L (ref 0.5–1.9)

## 2023-03-17 LAB — MAGNESIUM: Magnesium: 2 mg/dL (ref 1.7–2.4)

## 2023-03-17 LAB — MRSA NEXT GEN BY PCR, NASAL: MRSA by PCR Next Gen: NOT DETECTED

## 2023-03-17 LAB — HIV ANTIBODY (ROUTINE TESTING W REFLEX): HIV Screen 4th Generation wRfx: NONREACTIVE

## 2023-03-17 LAB — STREP PNEUMONIAE URINARY ANTIGEN: Strep Pneumo Urinary Antigen: NEGATIVE

## 2023-03-17 MED ORDER — DEXTROSE-SODIUM CHLORIDE 5-0.45 % IV SOLN: INTRAVENOUS | Status: AC

## 2023-03-17 MED ORDER — SODIUM CHLORIDE 0.9 % IV BOLUS
500.0000 mL | Freq: Once | INTRAVENOUS | Status: AC
  Administered 2023-03-17: 500 mL via INTRAVENOUS

## 2023-03-17 MED ORDER — LIDOCAINE 1 % OPTIME INJ - NO CHARGE
5.0000 mL | Freq: Once | INTRAMUSCULAR | Status: AC
  Administered 2023-03-17: 3 mL via INTRADERMAL
  Filled 2023-03-17: qty 6

## 2023-03-17 MED ORDER — SODIUM PHOSPHATES 45 MMOLE/15ML IV SOLN
45.0000 mmol | Freq: Once | INTRAVENOUS | Status: AC
  Administered 2023-03-17: 45 mmol via INTRAVENOUS
  Filled 2023-03-17: qty 15

## 2023-03-17 MED ORDER — POTASSIUM CHLORIDE 10 MEQ/100ML IV SOLN
10.0000 meq | INTRAVENOUS | Status: AC
  Administered 2023-03-17 (×6): 10 meq via INTRAVENOUS
  Filled 2023-03-17 (×7): qty 100

## 2023-03-17 MED ORDER — FREE WATER
200.0000 mL | Status: DC
  Administered 2023-03-17 – 2023-03-23 (×36): 200 mL

## 2023-03-17 MED ORDER — CHLORHEXIDINE GLUCONATE CLOTH 2 % EX PADS
6.0000 | MEDICATED_PAD | Freq: Every day | CUTANEOUS | Status: DC
  Administered 2023-03-17 – 2023-04-07 (×23): 6 via TOPICAL

## 2023-03-17 MED ORDER — DEXMEDETOMIDINE HCL IN NACL 400 MCG/100ML IV SOLN
0.0000 ug/kg/h | INTRAVENOUS | Status: DC
  Administered 2023-03-17: 0.4 ug/kg/h via INTRAVENOUS
  Administered 2023-03-18: 0.2 ug/kg/h via INTRAVENOUS
  Filled 2023-03-17 (×2): qty 100

## 2023-03-17 MED ORDER — HEPARIN SODIUM (PORCINE) 5000 UNIT/ML IJ SOLN
5000.0000 [IU] | Freq: Three times a day (TID) | INTRAMUSCULAR | Status: DC
  Administered 2023-03-17 – 2023-03-23 (×21): 5000 [IU] via SUBCUTANEOUS
  Filled 2023-03-17 (×23): qty 1

## 2023-03-17 MED ORDER — POTASSIUM CHLORIDE 20 MEQ PO PACK
40.0000 meq | PACK | Freq: Once | ORAL | Status: AC
Start: 2023-03-17 — End: 2023-03-17
  Administered 2023-03-17: 40 meq
  Filled 2023-03-17: qty 2

## 2023-03-17 NOTE — Progress Notes (Signed)
Pt transported from TraB to 2M02 on servo with RT and RN without complications.

## 2023-03-17 NOTE — Progress Notes (Signed)
[facility] ADULT ICU REPLACEMENT PROTOCOL FOR AM LAB REPLACEMENT ONLY  The patient does apply for the [facility] Adult ICU Electrolyte Replacment Protocol based on the criteria listed below:   1. Is GFR >/= 40 ml/min? Yes.    Patient's GFR today is 61.4 2. Is urine output >/= 0.5 ml/kg/hr for the last 6 hours? No. Patient's UOP is 0.1 ml/kg/hr 3. Is BUN < 60 mg/dL? Yes.    Patient's BUN today is 22 4. Abnormal electrolyte(s): Phos 1.6  5. Ordered repletion with: 45 mmol NaPhos given ongoing K replacement this AM 6. If a panic level lab has been reported, has the CCM MD in charge been notified? N/A Physician:  N/A   Audrey Turner 03/17/2023 7:31 AM

## 2023-03-17 NOTE — Progress Notes (Signed)
  Echocardiogram 2D Echocardiogram has been performed.  Audrey Turner 03/17/2023, 3:28 PM

## 2023-03-17 NOTE — Procedures (Signed)
Central Venous Catheter Insertion Procedure Note  Audrey Turner  [id number]  March 17, 1961  Date:03/17/23  Time:5:21 AM   Provider Performing:Jean-Pierre Quaran Kedzierski   Procedure: Insertion of Non-tunneled Central Venous [phone]) with US guidance (63016)   Indication(s) Medication administration  Consent Risks of the procedure as well as the alternatives and risks of each were explained to the patient and/or caregiver.  Consent for the procedure was obtained and is signed in the bedside chart  Anesthesia Topical only with 1% lidocaine   Timeout Verified patient identification, verified procedure, site/side was marked, verified correct patient position, special equipment/implants available, medications/allergies/relevant history reviewed, required imaging and test results available.  Sterile Technique Maximal sterile technique including full sterile barrier drape, hand hygiene, sterile gown, sterile gloves, mask, hair covering, sterile ultrasound probe cover (if used).  Procedure Description Area of catheter insertion was cleaned with chlorhexidine and draped in sterile fashion.  With real-time ultrasound guidance a central venous catheter was placed into the right internal jugular vein. Nonpulsatile blood flow and easy flushing noted in all ports.  The catheter was sutured in place and sterile dressing applied.  Complications/Tolerance None; patient tolerated the procedure well. Chest X-ray is ordered to verify placement for internal jugular or subclavian cannulation.   Chest x-ray is not ordered for femoral cannulation.  EBL Minimal  Specimen(s) None   Janann Colonel, MD [facility] 03/17/2023 5:22 AM

## 2023-03-17 NOTE — Procedures (Signed)
Technically successful fluoro guided LP at L2-3 level with opening pressure of 15 cm H2O   6 cc of clear, colorless CSF sent to lab for analysis.  No immediate post procedural complication.  If patient is to be extubated within the next 6 hours recommend bedrest with HOB flat x 6 hours.   Please see imaging section of Epic for full dictation.  Lynnette Caffey, PA-C

## 2023-03-17 NOTE — Plan of Care (Addendum)
CSF: WBC 2, RBC 8, protein 33, glucose 106, colorless Meningitis/encephalitis panel negative Opening pressure 15  Reassuring against meningitis  Have also ordered B12, thiamine, TSH, RPR, HIV for reversible causes of dementia workup  Neurology will continue to follow

## 2023-03-17 NOTE — Progress Notes (Signed)
Patient transported to IR and back to room 2M02 without complications.

## 2023-03-17 NOTE — Progress Notes (Signed)
LTM EEG running - no initial skin breakdown - push button tested - neuro notified. Thick golden color film of dead skin covering top, back and right side of pt head. Skin very prone to breakdown. Due to thick film of dead skin, o1 and o2 shifted 1cm forward to achieve acceptable impedances without skin breakdown.  ATRIUM NOTIFIED  HU charge captured

## 2023-03-17 NOTE — Plan of Care (Signed)
  Problem: Coping: Goal: Ability to adjust to condition or change in health will improve Outcome: Progressing   Problem: Fluid Volume: Goal: Ability to maintain a balanced intake and output will improve Outcome: Progressing   Problem: Health Behavior/Discharge Planning: Goal: Ability to identify and utilize available resources and services will improve Outcome: Progressing   Problem: Clinical Measurements: Goal: Ability to maintain clinical measurements within normal limits will improve Outcome: Progressing   Problem: Clinical Measurements: Goal: Will remain free from infection Outcome: Progressing   Problem: Clinical Measurements: Goal: Diagnostic test results will improve Outcome: Progressing

## 2023-03-17 NOTE — Assessment & Plan Note (Signed)
She does not have a fever.  No lymphadenopathy no exudates.  No cough and lungs are clear.  I suspect the postnasal drip is contributing to her hoarse voice and changes with swallowing.  She still handling soft foods.  Her brother had an antecedent URI, he is better in the meantime.  I think it makes sense to gradually advance her diet as tolerated with observation over the next few days and then update [place] as needed.  At this point still okay for outpatient follow-up.

## 2023-03-17 NOTE — Progress Notes (Signed)
F/u labs reviewed Na 158-->155 Cr now normalized K 2.3/mg 2, po4 1.6 Plan Replacing K Changing IVF to d51/2 ns  Cont to follow serial chems

## 2023-03-17 NOTE — Progress Notes (Signed)
NEUROLOGY CONSULT FOLLOW UP NOTE   Date of service: March 17, 2023 Patient Name: Audrey Turner MRN:  [id number] DOB:  1960-12-15  Brief HPI  12/07 HPI: "Audrey Turner is a 62 y.o. 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 Hx of seizures on Keppra 1000mg  BID who has been having cold like symptoms that developed last week, trouble swallowing liquids over the weekend that progressed to trouble swallowing solids early this week. Has not been eating and drinking. Brother took her to PCP, tested for strep and was negative. Symptoms felt to be due to post nasal drip. Was able to eat soft foods like mashed potatos but not been able to tolerate liquids. She continued to decline. This PM ,was lethargic, moving her arms and legs less and generalized weakness. Around 5PM, noted to have difficulty moving her arms and legs and then around 630PM, not talking much and confused and starring off. Brother called EMS and she was brought in to the ED where a code stroke was activated for concern for aphasia."  12/07 - failed bedside LP    Interval Hx/subjective   Plan for LP with IR today No clinical concern for seizure overnight per bedside nurse  Vitals   Vitals:   03/17/23 0411 03/17/23 0449 03/17/23 0500 03/17/23 0600  BP:   113/62 119/64  Pulse: 69 69 71 84  Resp: 18 12 12 15   Temp:    98.7 F (37.1 C)  TempSrc:    Oral  SpO2: 100% 100% 100% 100%     There is no height or weight on file to calculate BMI.  Physical Exam   General: Laying comfortably in bed; intubated    Neurologic Examination  Mental status/Cognition: eyes open, starring off, does not answer any orientation questions. Speech/language: mute, no speech throughout the encounter. Cranial nerves:   CN II Pupils equal and reactive to light   CN III,IV,VI EOMI absent    CN V Corneals absent   CN VII No facial grimace to noxious stimuli   CN VIII Does not make  eye contact to speech   CN IX & X Cough intact to suction    CN XI Head midline   CN XII Unable to assess secondary to ETT    Sensory/Motor:  Muscle bulk: poor, tone slightly increased in all extremities. No spontaneous movement noted in all extremities. No antigravity movements. Withdraws to pinch in all extremities.   Coordination/Complex Motor:  Unable to assess.  Medications  Current Facility-Administered Medications:    0.9 %  sodium chloride infusion, 250 mL, Intravenous, Continuous, Simonne Martinet, NP, Last Rate: 20 mL/hr at 03/17/23 0600, Infusion Verify at 03/17/23 0600   acetaminophen (OFIRMEV) IV 1,000 mg, 1,000 mg, Intravenous, Q6H, Simonne Martinet, NP, Stopped at 03/17/23 0559   azithromycin (ZITHROMAX) 500 mg in sodium chloride 0.9 % 250 mL IVPB, 500 mg, Intravenous, Q24H, Assaker, Jean-Pierre, MD, Paused at 03/17/23 0321   cefTRIAXone (ROCEPHIN) 2 g in sodium chloride 0.9 % 100 mL IVPB, 2 g, Intravenous, Q12H, Arabella Merles, RPH, Stopped at 03/17/23 7829   Chlorhexidine Gluconate Cloth 2 % PADS 6 each, 6 each, Topical, Daily, Assaker, Jean-Pierre, MD, 6 each at 03/17/23 0120   dexamethasone (DECADRON) injection 10 mg, 10 mg, Intravenous, Q6H, Simonne Martinet, NP, 10 mg at 03/17/23 0538   dextrose 5 % and 0.45 % NaCl infusion, , Intravenous, Continuous, Simonne Martinet, NP, Last Rate: 100 mL/hr at  03/17/23 0613, New Bag at 03/17/23 0613   docusate (COLACE) 50 MG/5ML liquid 100 mg, 100 mg, Per Tube, BID, Simonne Martinet, NP   docusate sodium (COLACE) capsule 100 mg, 100 mg, Oral, BID PRN, Simonne Martinet, NP   famotidine (PEPCID) IVPB 20 mg premix, 20 mg, Intravenous, Q12H, Simonne Martinet, NP   fentaNYL (SUBLIMAZE) injection 50 mcg, 50 mcg, Intravenous, Q15 min PRN, Simonne Martinet, NP   fentaNYL (SUBLIMAZE) injection 50-200 mcg, 50-200 mcg, Intravenous, Q30 min PRN, Simonne Martinet, NP   free water 200 mL, 200 mL, Per Tube, Q4H, Simonne Martinet, NP   heparin  injection 5,000 Units, 5,000 Units, Subcutaneous, Q8H, Simonne Martinet, NP, 5,000 Units at 03/17/23 0618   insulin aspart (novoLOG) injection 0-15 Units, 0-15 Units, Subcutaneous, TID WC, Simonne Martinet, NP   levETIRAcetam (KEPPRA) IVPB 1000 mg/100 mL premix, 1,000 mg, Intravenous, BID, Simonne Martinet, NP   midazolam (VERSED) 100 mg/100 mL (1 mg/mL) premix infusion, 2 mg/hr, Intravenous, Continuous, Simonne Martinet, NP, Last Rate: 2 mL/hr at 03/16/23 2348, 2 mg/hr at 03/16/23 2348   midazolam (VERSED) injection 4 mg, 4 mg, Intravenous, Once, Simonne Martinet, NP   norepinephrine (LEVOPHED) 4mg  in (0.016 mg/mL) premix infusion, 2-10 mcg/min, Intravenous, Titrated, Simonne Martinet, NP, Last Rate: 7.5 mL/hr at 03/17/23 0600, 2 mcg/min at 03/17/23 0600   Oral care mouth rinse, 15 mL, Mouth Rinse, Q2H, Simonne Martinet, NP, 15 mL at 03/17/23 0545   Oral care mouth rinse, 15 mL, Mouth Rinse, PRN, Simonne Martinet, NP   polyethylene glycol (MIRALAX / GLYCOLAX) packet 17 g, 17 g, Per Tube, Daily, Simonne Martinet, NP   polyethylene glycol (MIRALAX / GLYCOLAX) packet 17 g, 17 g, Oral, Daily PRN, Simonne Martinet, NP   potassium chloride 10 mEq in 100 mL IVPB, 10 mEq, Intravenous, Q1 Hr x 6, Dela Albin Fischer, MD, Last Rate: 100 mL/hr at 03/17/23 0611, 10 mEq at 03/17/23 0611   propofol (DIPRIVAN) 1000 MG/100ML infusion, 5-80 mcg/kg/min, Intravenous, Titrated, Simonne Martinet, NP, Last Rate: 16.38 mL/hr at 03/17/23 0600, 35 mcg/kg/min at 03/17/23 0600   rocuronium (ZEMURON) injection 100 mg, 100 mg, Intravenous, Once, Simonne Martinet, NP   vancomycin (VANCOREADY) IVPB 750 mg/150 mL, 750 mg, Intravenous, Donell Sievert, [facility]    Basic Metabolic Panel: Recent Labs  Lab 03/16/23 1955 03/16/23 2000 03/17/23 0047 03/17/23 0251 03/17/23 0627  NA 159* 158* 156* 155* 157*  K 4.7 5.4* 2.6* 2.4* 3.0*  CL 115* 118*  --  123*  --   CO2 19*  --   --  16*  --    GLUCOSE 134* 132*  --  173*  --   BUN 26* 43*  --  22*  --   CREATININE 1.53* 1.10*  --  0.90  --   CALCIUM 9.2  --   --  7.1*  --   MG  --   --   --  2.0  --   PHOS  --   --   --  1.6*  --     CBC: Recent Labs  Lab 03/16/23 1955 03/16/23 2000 03/17/23 0047 03/17/23 0251 03/17/23 0627  WBC 13.7*  --   --  10.6*  --   NEUTROABS 11.6*  --   --  8.3*  --   HGB 15.1* 15.6* 10.2* 9.9* 9.5*  HCT 46.6* 46.0 30.0* 30.7* 28.0*  MCV 81.3  --   --  81.4  --   PLT 312  --   --  208  --     Coagulation Studies: Recent Labs    03/16/23 1955  LABPROT 19.6*  INR 1.6*      Lipid Panel:  Lab Results  Component Value Date   LDLCALC 94 05/30/2021   HgbA1c:  Lab Results  Component Value Date   HGBA1C 4.9 05/30/2021   Urine Drug Screen:     Component Value Date/Time   LABOPIA NONE DETECTED 03/16/2023 2102   COCAINSCRNUR NONE DETECTED 03/16/2023 2102   LABBENZ POSITIVE (A) 03/16/2023 2102   AMPHETMU NONE DETECTED 03/16/2023 2102   THCU NONE DETECTED 03/16/2023 2102   LABBARB NONE DETECTED 03/16/2023 2102    Alcohol Level     Component Value Date/Time   ETH <10 03/16/2023 1955    AED levels: No results found for: "PHENYTOIN", "ZONISAMIDE", "LAMOTRIGINE", "LEVETIRACETA"  CT Head without contrast(Personally reviewed): CTH was negative for a large hypodensity concerning for a large territory infarct or hyperdensity concerning for an ICH   MRI Brain(Personally reviewed): No acute stroke   Neurodiagnostics cEEG:  pending  Assessment    Breakthrough seizures in the setting of possible URI and missed keppra due to difficulty swallowing (first liquids, then solids), also with significant hypernatremia, hypokalemia  Recommendations  - Overnight LTM EEG - s/p Keppra 4500mg  Iv once, resume Keppra 1000mg  BID. - seizure precautions. - Appreciate treatment of note electrolyte derangements and management of comorbidities per primary team. - Wean versed/propofol and  transition to precedex if possible to confirm seizure control  - Appreciate LP pending with IR  - Discussed with CCM via secure chat  ______________________________________________________________________   Nunzio Cory MD-PhD [facility] [phone]   CRITICAL CARE Performed by: Gordy Councilman   Total critical care time: 30 minutes  Critical care time was exclusive of separately billable procedures and treating other patients.  Critical care was necessary to treat or prevent imminent or life-threatening deterioration.  Critical care was time spent personally by me on the following activities: development of treatment plan with patient and/or surrogate as well as nursing, discussions with consultants, evaluation of patient's response to treatment, examination of patient, obtaining history from patient or surrogate, ordering and performing treatments and interventions, ordering and review of laboratory studies, ordering and review of radiographic studies, pulse oximetry and re-evaluation of patient's condition.

## 2023-03-17 NOTE — Procedures (Signed)
Intubation Procedure Note  Krishauna Ramon  [id number]  01/10/1961  Date:03/17/23  Time:3:08 AM   Provider Performing:Pete E Tanja Port    Procedure: Intubation (31500)  Indication(s) Respiratory Failure  Consent Unable to obtain consent due to emergent nature of procedure.   Anesthesia Versed and Rocuronium  50 mg ketamine    Time Out Verified patient identification, verified procedure, site/side was marked, verified correct patient position, special equipment/implants available, medications/allergies/relevant history reviewed, required imaging and test results available.   Sterile Technique Usual hand hygeine, masks, and gloves were used   Procedure Description Patient positioned in bed supine.  Sedation given as noted above.  Patient was intubated with endotracheal tube using Glidescope.  View was Grade 1 full glottis .  Number of attempts was 1.  Colorimetric CO2 detector was consistent with tracheal placement.   Complications/Tolerance None; patient tolerated the procedure well. Chest X-ray is ordered to verify placement.   EBL Minimal   Specimen(s) None

## 2023-03-17 NOTE — Progress Notes (Addendum)
NAME:  Kwanita Orman, MRN:  [id number], DOB:  01/09/1961, LOS: 1 ADMISSION DATE:  03/16/2023, CONSULTATION DATE:  12/7 REFERRING MD:  remy, CHIEF COMPLAINT:  acute encephalopathy    History of Present Illness:  62 year old female who is w/c bound at baseline w/ RUE contracted and RLE weakness but oriented x3 after neurosurgery in 2007.  Lives w/ and cared for by brother [name] 5-7d ago was having some trouble swallowing.  Following that over the following days was having runny nose, sore throat and congestions. Her speech quality worsened her brother though laryngitis. Over last 2d PTA decreased PO intake, having more trouble swallowing, not able to take in all her Keppra over last 24 hrs PTA. On 12/7 was at home w/ brother. He thought he witnessed a seizure and called EMS. She received versed on way to ER.  On arrival to ER pt was unresponsive w/ left gaze pref.  Initial ER eval  CT and MRI brain negative for acute changes. Did show remote craniectomy w/ex vacuo dilatation of left lateral ventricle She was loaded w/ Keppra Initial labs: cbg 124, INR 1.6, Na 159, CO2 19, BUN 26, cr 1.53, Trop 145. Lactate 4.1 Neg COVID Blood cultures sent Antibiotics started.  On CCM arrival still not responsive. Rigid w/ possible nuchal rigidity. Left gaze pref. 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  History of seizures on Keppra 100 mg p.o. twice daily. Depression At baseline in [facility] since surgery  Full fxn on left. Able to stand and pivot but weak right leg. Contracted right arm. She is A&O at baseline but needs assist w/ ADLs including eating Significant Hospital Events: Including procedures, antibiotic start and stop dates in addition to other pertinent events   12/7 admitted w/  sepsis and acute metabolic enceph w/ break thru seizures, PNA and possible meningitis started on vanc, ceftriaxone and azith also decadron   Interim History / Subjective:   Remains on the ventilator  Objective   Blood pressure 119/64, pulse 84, temperature 98.7 F (37.1 C), temperature source Oral, resp. rate 15, SpO2 100%.    Vent Mode: PRVC FiO2 (%):  [40 %-100 %] 40 % Set Rate:  [12 bmp-18 bmp] 12 bmp Vt Set:  [350 mL-400 mL] 350 mL PEEP:  [5 cmH20] 5 cmH20 Plateau Pressure:  [18 cmH20-22 cmH20] 18 cmH20   Intake/Output Summary (Last 24 hours) at 03/17/2023 0727 Last data filed at 03/17/2023 0600 Gross per 24 hour  Intake 1902.88 ml  Output 150 ml  Net 1752.88 ml   There were no vitals filed for this visit.  Examination: Gen:      No acute distress, chronically ill appearing HEENT:  EOMI, sclera anicteric, ETT Neck:     No masses; no thyromegaly Lungs:    Clear to auscultation bilaterally; normal respiratory effort CV:         Regular rate and rhythm; no murmurs Abd:      + bowel sounds; soft, non-tender; no palpable masses, no distension Ext:    No edema; adequate peripheral perfusion Skin:      Warm and dry; no rash Neuro: Sedated  Lab/imaging reviewed Significant for sodium 155, potassium 2.4, BUN/creatinine 22/0.90 WBC 10.6, hemoglobin 9.9, platelets 208   Resolved Hospital Problem list     Assessment &  Plan:  Acute metabolic encephalopathy multifactorial due to sepsis, status epilepticus and possibly meningitis Plan Continue antiepileptics per neurology Bedside LP was unsuccessful.  Will consult IR for procedure Continue antibiotics  Status epilepticus prior h/o astrocytoma s/p resxn 2007 Plan Cont keppra as directed by neuro LTM Seizure precautions Cont Prop and versed. Additional titration TBD by LTM  Acute respiratory failure secondary to pneumonia, aspiration Intubated for airway protection  Plan Continue full vent support Follow intermittent  chest x-ray  Right lower lobe PNA (CAP vs aspiration)  Plan Respiratory culture Send u strep, legionella antigen as well as Resp virus panel On Vanco, ceftriaxone and azithromycin.  Sepsis w/ lactic acidosis 2/2 PNA vs meningitis  Lactate has cleared Plan Follow cultures  Fluid and electrolyte imbalance: hypernatremia, hyperkalemia followed by hypokalemia Now s/p volume resuscitation  Plan Cont IVFs Repeating CMP (all labs via Istat so not clear of accuracy   AKI secondary to sepsis  Plan Cont IVFs Renal dose meds  Strict I&O Am chem   Mild coagulopathy  INR 1.6 Plan Trend   Mild elevation of troponin Plan Cont tele, Check EKG Consider ECHO  Best Practice (right click and "Reselect all SmartList Selections" daily)   Diet/type: NPO DVT prophylaxis SCD Pressure ulcer(s): N/A GI prophylaxis: H2B Lines: N/A Foley:  Yes, and it is still needed Code Status:  full code Last date of multidisciplinary goals of care discussion [brother [name] at bedside. Requested full code and aggressive are]  Critical care time:    The patient is critically ill with multiple organ system failure and requires high complexity decision making for assessment and support, frequent evaluation and titration of therapies, advanced monitoring, review of radiographic studies and interpretation of complex data.   Critical Care Time devoted to patient care services, exclusive of separately billable procedures, described in this note is 35 minutes.   Chilton Greathouse MD [facility] See Amion for pager  If no response to pager , please call [phone] until 7pm After 7:00 pm call Elink  [phone] 03/17/2023, 8:47 AM

## 2023-03-17 NOTE — Progress Notes (Addendum)
eLink Physician-Brief Progress Note Patient Name: Audrey Turner DOB: [birth year] MRN: [id number]   Date of Service  03/17/2023  HPI/Events of Note  62/F with history of seizures, presenting with AMS. Pt was reported to have had cold symptoms last week with associated poor PO intake. Today, she was noted to be lethargic, and had episodes where she would just stare off in space. She was brought to the ED where she was intubated for airway protection. Code stroke was activated and initial imaging was negative. She was evaluated by neurology, concern raised for breakthrough seizure, she was loaded with keppra and admitted to the ICU.   eICU Interventions  Acute encephalopathy, possible breakthrough seizures - Loaded with keppra in the ED - Now with propofol and versed drips running.  - Planned for EEG tonight. Will follow results.  - Pt also started on empiric antibiotics, systemic steroids for possible meningitis.  - Planned for LP  Respiratory failure - Maintain on low tidal volume ventilation strategy - Decrease FiO2 and PEEP to target SpO2 >90% - Serial ABG, will make further vent changes as appropriate.   SUP - famotidine DVT ppx - SCDs        Legacy Lacivita M DELA CRUZ 03/17/2023, 12:35 AM  4:25 AM Notified of K 2.4 Placed order for repletion with KCL IV x 8

## 2023-03-17 NOTE — Progress Notes (Signed)
LTM maint complete - no skin breakdown under: A2,P7

## 2023-03-17 NOTE — Procedures (Signed)
Patient Name: Audrey Turner  MRN: [id number]  Epilepsy Attending: Charlsie Quest  Referring Physician/Provider: Erick Blinks, MD  Duration: 03/17/2023 0118 to 12/9/20204 0230  Patient history: 61yo M with breakthrough seizure getting eeg to evaluate for seizure  Level of alertness: comatose  AEDs during EEG study: LEV, propofol  Technical aspects: This EEG study was done with scalp electrodes positioned according to the 10-20 International system of electrode placement. Electrical activity was reviewed with band pass filter of 1-70Hz , sensitivity of 7 uV/mm, display speed of 86mm/sec with a 60Hz  notched filter applied as appropriate. EEG data were recorded continuously and digitally stored.  Video monitoring was available and reviewed as appropriate.  Description: At the beginning of the study, EEG showed burst attenuation pattern with asynchronous bursts of generalized and lateralized left hemisphere 3-5hz  theta-delta slowing admixed with 13-15Hz  beta activity lasting 1-4 seconds alternating with 1-3 seconds of generalized attenuation. Gradually as sedation was weaned, EEG showed continuous generalized and lateralized left hemisphere 3-6Hz  theta-delta slowing, at times with triphasic morphology and periodic at 1-2Hz . EEG also showed breach artifact in vertex region with 3-5Hz  sharply contoured theta-delta slowing admixed with 12-14Hz  beta activity. Polyspikes were noted in left frontal, vertex region. Hyperventilation and photic stimulation were not performed.     EEG was disconnected between 03/17/2023 1300 to 1415 for fluoro guided LP.  ABNORMALITY - Polyspikes, left frontal and vertex region. - Burst attenuation, generalized and lateralized left hemisphere - Breach artifact, vertex region  IMPRESSION: This study showed evidence of epileptogenicity arising from  left frontal and vertex region.  There was cortical dysfunction arising from left hemisphere likely secondary  to underlying structural abnormality. Additionally there was cortical dysfunction in vertex region consistent with underlying craniotomy.  Lastly there was severe to profound diffuse encephalopathy related to sedation. As sedation was weaned, this improved to moderate to severe encephalopathy.  No seizures were seen throughout the recording.  [name] [name]

## 2023-03-17 NOTE — Progress Notes (Signed)
eLink Physician-Brief Progress Note Patient Name: Audrey Turner DOB: [birth year] MRN: [id number]   Date of Service  03/17/2023  HPI/Events of Note  Notified that RN suctioned food particles and concerned about possible aspiration.  Pt is intubated and sedated.   eICU Interventions  Get CXR.      Intervention Category Intermediate Interventions: Other:  Larinda Buttery 03/17/2023, 11:57 PM  3:59 AM CXR unchanged.   Plan> Continue to monitor for now.

## 2023-03-17 NOTE — Progress Notes (Signed)
Equipment set up in ed, head marked, head prepped, pt moved to new room. Eeg to be hooked up when pt is settled into new room

## 2023-03-18 ENCOUNTER — Inpatient Hospital Stay (HOSPITAL_COMMUNITY): Payer: Self-pay

## 2023-03-18 ENCOUNTER — Inpatient Hospital Stay (HOSPITAL_COMMUNITY): Payer: Medicare Other

## 2023-03-18 ENCOUNTER — Other Ambulatory Visit (HOSPITAL_COMMUNITY): Payer: Self-pay

## 2023-03-18 DIAGNOSIS — G9340 Encephalopathy, unspecified: Secondary | ICD-10-CM

## 2023-03-18 LAB — BLOOD CULTURE ID PANEL (REFLEXED) - BCID2

## 2023-03-18 LAB — RPR: RPR Ser Ql: NONREACTIVE

## 2023-03-18 LAB — BASIC METABOLIC PANEL
Anion gap: 10 (ref 5–15)
BUN: 18 mg/dL (ref 6–20)
CO2: 19 mmol/L — ABNORMAL LOW (ref 22–32)
Calcium: 6.7 mg/dL — ABNORMAL LOW (ref 8.9–10.3)
Chloride: 117 mmol/L — ABNORMAL HIGH (ref 98–111)
Creatinine, Ser: 0.66 mg/dL (ref 0.44–1.00)
GFR, Estimated: >60 mL/min (ref >60)
Glucose, Bld: 223 mg/dL — ABNORMAL HIGH (ref 70–99)
Potassium: 3 mmol/L — ABNORMAL LOW (ref 3.5–5.1)
Sodium: 146 mmol/L — ABNORMAL HIGH (ref 135–145)

## 2023-03-18 LAB — CBC
HCT: 26.7 % — ABNORMAL LOW (ref 36.0–46.0)
Hemoglobin: 8.8 g/dL — ABNORMAL LOW (ref 12.0–15.0)
MCH: 26 pg (ref 26.0–34.0)
MCHC: 33 g/dL (ref 30.0–36.0)
MCV: 79 fL — ABNORMAL LOW (ref 80.0–100.0)
Platelets: 204 10*3/uL (ref 150–400)
RBC: 3.38 MIL/uL — ABNORMAL LOW (ref 3.87–5.11)
RDW: 14.4 % (ref 11.5–15.5)
WBC: 14.4 10*3/uL — ABNORMAL HIGH (ref 4.0–10.5)
nRBC: 0.1 % (ref 0.0–0.2)

## 2023-03-18 LAB — TSH: TSH: 0.372 u[IU]/mL (ref 0.350–4.500)

## 2023-03-18 LAB — GLUCOSE, CAPILLARY
Glucose-Capillary: 210 mg/dL — ABNORMAL HIGH (ref 70–99)
Glucose-Capillary: 190 mg/dL — ABNORMAL HIGH (ref 70–99)
Glucose-Capillary: 184 mg/dL — ABNORMAL HIGH (ref 70–99)
Glucose-Capillary: 84 mg/dL (ref 70–99)
Glucose-Capillary: 79 mg/dL (ref 70–99)
Glucose-Capillary: 94 mg/dL (ref 70–99)
Glucose-Capillary: 165 mg/dL — ABNORMAL HIGH (ref 70–99)

## 2023-03-18 LAB — TROPONIN I (HIGH SENSITIVITY): Troponin I (High Sensitivity): 44 ng/L — ABNORMAL HIGH (ref <18)

## 2023-03-18 LAB — CYTOLOGY - NON PAP

## 2023-03-18 LAB — MAGNESIUM: Magnesium: 1.7 mg/dL (ref 1.7–2.4)

## 2023-03-18 LAB — PHOSPHORUS: Phosphorus: 2.9 mg/dL (ref 2.5–4.6)

## 2023-03-18 LAB — VITAMIN B12: Vitamin B-12: 812 pg/mL (ref 180–914)

## 2023-03-18 LAB — PROCALCITONIN: Procalcitonin: 3 ng/mL

## 2023-03-18 MED ORDER — OSMOLITE 1.2 CAL PO LIQD
1000.0000 mL | ORAL | Status: DC
Start: 2023-03-18 — End: 2023-04-10
  Administered 2023-03-18 – 2023-04-08 (×18): 1000 mL
  Filled 2023-03-18 (×36): qty 1000

## 2023-03-18 MED ORDER — AMANTADINE HCL 50 MG/5ML PO SOLN
100.0000 mg | Freq: Two times a day (BID) | ORAL | Status: DC
  Administered 2023-03-18 – 2023-04-09 (×46): 100 mg
  Filled 2023-03-18 (×49): qty 10

## 2023-03-18 MED ORDER — INSULIN ASPART 100 UNIT/ML IJ SOLN
0.0000 [IU] | INTRAMUSCULAR | Status: DC
  Administered 2023-03-18 (×3): 3 [IU] via SUBCUTANEOUS
  Administered 2023-03-19: 2 [IU] via SUBCUTANEOUS

## 2023-03-18 MED ORDER — SODIUM CHLORIDE 0.9 % IV SOLN
2.0000 g | INTRAVENOUS | Status: DC
  Administered 2023-03-19: 2 g via INTRAVENOUS
  Filled 2023-03-18: qty 20

## 2023-03-18 MED ORDER — VENLAFAXINE HCL 37.5 MG PO TABS
37.5000 mg | ORAL_TABLET | Freq: Two times a day (BID) | ORAL | Status: DC
  Administered 2023-03-18 – 2023-03-20 (×5): 37.5 mg
  Filled 2023-03-18 (×5): qty 1

## 2023-03-18 MED ORDER — JUVEN PO PACK
1.0000 | PACK | Freq: Two times a day (BID) | ORAL | Status: DC
Start: 2023-03-18 — End: 2023-03-25
  Administered 2023-03-18 – 2023-03-25 (×15): 1
  Filled 2023-03-18 (×15): qty 1

## 2023-03-18 MED ORDER — POTASSIUM & SODIUM PHOSPHATES 280-160-250 MG PO PACK
2.0000 | PACK | Freq: Once | ORAL | Status: AC
  Administered 2023-03-18: 2
  Filled 2023-03-18: qty 2

## 2023-03-18 MED ORDER — ARIPIPRAZOLE 5 MG PO TABS
2.5000 mg | ORAL_TABLET | Freq: Every day | ORAL | Status: DC
  Administered 2023-03-18 – 2023-03-20 (×3): 2.5 mg
  Filled 2023-03-18 (×3): qty 1

## 2023-03-18 MED ORDER — THIAMINE HCL 100 MG/ML IJ SOLN
100.0000 mg | Freq: Every day | INTRAMUSCULAR | Status: DC
  Administered 2023-03-18 – 2023-03-20 (×3): 100 mg via INTRAVENOUS
  Filled 2023-03-18 (×3): qty 2

## 2023-03-18 MED ORDER — POTASSIUM CHLORIDE 20 MEQ PO PACK
20.0000 meq | PACK | ORAL | Status: AC
  Administered 2023-03-18 (×2): 20 meq
  Filled 2023-03-18 (×2): qty 1

## 2023-03-18 MED ORDER — FAMOTIDINE 20 MG PO TABS
20.0000 mg | ORAL_TABLET | Freq: Two times a day (BID) | ORAL | Status: DC
  Administered 2023-03-18 – 2023-03-19 (×4): 20 mg
  Filled 2023-03-18 (×4): qty 1

## 2023-03-18 MED ORDER — SODIUM CHLORIDE 0.9 % IV SOLN
500.0000 mg | INTRAVENOUS | Status: AC
  Administered 2023-03-18 – 2023-03-19 (×2): 500 mg via INTRAVENOUS
  Filled 2023-03-18 (×2): qty 5

## 2023-03-18 MED ORDER — CALCIUM GLUCONATE-NACL 2-0.675 GM/100ML-% IV SOLN
2.0000 g | Freq: Once | INTRAVENOUS | Status: AC
  Administered 2023-03-18: 2000 mg via INTRAVENOUS
  Filled 2023-03-18: qty 100

## 2023-03-18 MED ORDER — MAGNESIUM SULFATE 2 GM/50ML IV SOLN
2.0000 g | Freq: Once | INTRAVENOUS | Status: AC
  Administered 2023-03-18: 2 g via INTRAVENOUS
  Filled 2023-03-18: qty 50

## 2023-03-18 MED ORDER — LEVETIRACETAM 100 MG/ML PO SOLN
1000.0000 mg | Freq: Two times a day (BID) | ORAL | Status: DC
  Administered 2023-03-18 – 2023-03-23 (×12): 1000 mg
  Filled 2023-03-18 (×19): qty 10

## 2023-03-18 MED ORDER — POTASSIUM CHLORIDE 10 MEQ/50ML IV SOLN
10.0000 meq | INTRAVENOUS | Status: AC
  Administered 2023-03-18 (×4): 10 meq via INTRAVENOUS
  Filled 2023-03-18: qty 50

## 2023-03-18 NOTE — Consult Note (Addendum)
Cardiology Consultation   Patient ID: Audrey Turner MRN: [id number]; DOB: 22-Jan-1961  Admit date: 03/16/2023 Date of Consult: 03/18/2023  PCP:  Audrey Nam, MD has   [facility] Providers Cardiologist:  New (Dr. Anne Turner) Click here to update MD or APP on Care Team, Refresh:1}     Patient Profile:   Audrey Turner is a 62 y.o. female with a history of astrocytoma s/p chemo, radiation, and multiple resections (in remission since 2011), seizures, and depression who was admitted on 03/16/2023 for acute metabolic encephalopathy felt to be secondary to sepsis from pneumonia and status epilepticus. She was intubated for airway protection. Cardiology was consulted on 03/18/2023 for further evaluation of reduced EF at the request of Dr. Isaiah Serge.   History of Present Illness:   [name] is a 62 year old female with the above history. She has no known cardiac history. She has a history of grade 3 astrocytoma which was diagnosed in 2017. She underwent chemo, radiation, and multiple resections at [facility] and has been in remission since at least 2011. She also has a history of seizures secondary to her astrocytoma and is on Keppra.  She is now wheelchair-bound.  He is followed by Neurology and Oncology.  Patient lives with and is cared for by her brother Audrey Turner.  She presented to the ED on 03/16/2023 for altered mental status.  Code stroke was initially called; however, she was noted to have increased muscle tone suspicious for active seizures and was nonresponsive.  Head CT showed progression of bilateral white matter hypoattenuation and left frontal encephalomalacia but no acute findings.  MRI showed no acute intracranial abnormalities.  Neurology felt like she was likely having breakthrough seizures.  She was intubated for concern for airway protection. Labs were significant for mild leukocytosis (13.7), hypernatremia (Na 159), AKI (creatinine 1.53), and mildly elevated  high-sensitivity troponin (145 >> 87 >> 185).  She was admitted admitted for acute metabolic encephalopathy secondary to sepsis in the setting of pneumonia and status epilepticus.  Started on IV Keppra.  She was no abnormalities on admission there was initially some concern for possible meningitis.  She underwent lumbar puncture on 03/17/2023 which was not suggestive of this.  Echo showed LVEF of 45-50% with global hypokinesis and grade 2 diastolic dysfunction, normal RV size, and function, and mild MR. Cardiology was consulted for further evaluation of cardiomyopathy.  Patient is currently still intubated and sedated. No family is at bedside.  Past Medical History:  Diagnosis Date   Acute sinusitis, unspecified    Astrocytoma (HCC) 01/2006 /09/2006   grade 3 atrocytoma s/p chemo/radiation/surgery at [facility], in remission since at least 2011   Cancer [facility])    Brain    Diarrhea    Drug induced neutropenia(288.03)    Primary exertional headache    Recurrent depression (HCC)    Vaginitis     Past Surgical History:  Procedure Laterality Date   BRAIN SURGERY     [facility].   d&c miscarriage  1993   left frontal craniotomy w/ left subtotal resection of left frontal brain tumor  01/10/2006   [facility] brain tumor surgery  01/13/2006   NSVD x 3 - premie at 22 wks     resection residual tumor  10/08/2006   Dr. Zachery Conch: [facility]   skull surg  2009/2010/2012   3 times past brain surg to close up opening       Inpatient Medications: Scheduled Meds:  amantadine  100 mg Per  Tube BID   ARIPiprazole  2.5 mg Per Tube Daily   Chlorhexidine Gluconate Cloth  6 each Topical Daily   docusate  100 mg Per Tube BID   famotidine  20 mg Per Tube BID   free water  200 mL Per Tube Q4H   heparin injection (subcutaneous)  5,000 Units Subcutaneous Q8H   insulin aspart  0-15 Units Subcutaneous Q4H   levETIRAcetam  1,000 mg Per Tube BID   mouth rinse  15 mL Mouth Rinse Q2H   polyethylene glycol  17 g Per Tube  Daily   thiamine (VITAMIN B1) injection  100 mg Intravenous Daily   venlafaxine  37.5 mg Per Tube BID   Continuous Infusions:  [START ON 03/19/2023] azithromycin     calcium gluconate 2,000 mg (03/18/23 1046)   [START ON 03/19/2023] cefTRIAXone (ROCEPHIN)  IV     dexmedetomidine (PRECEDEX) IV infusion 0.1 mcg/kg/hr (03/18/23 1000)   potassium chloride 10 mEq (03/18/23 1040)   PRN Meds: docusate sodium, fentaNYL (SUBLIMAZE) injection, fentaNYL (SUBLIMAZE) injection, mouth rinse, polyethylene glycol  Allergies:    Allergies  Allergen Reactions   Dilantin [Phenytoin Sodium Extended] Rash   Penicillins Rash    Social History:   Social History   Socioeconomic History   Marital status: Divorced    Spouse name: Not on file   Number of children: 3   Years of education: Not on file   Highest education level: Not on file  Occupational History   Occupation: stay at home mother  Tobacco Use   Smoking status: Never   Smokeless tobacco: Never  Vaping Use   Vaping status: Never Used  Substance and Sexual Activity   Alcohol use: No   Drug use: No   Sexual activity: Never    Birth control/protection: None  Other Topics Concern   Not on file  Social History Narrative   Divorced   From [place], [place]   Lives with brother.     Single story house, with ramp.     Social Determinants of Health   Financial Resource Strain: Low Risk  (05/23/2021)   Overall Financial Resource Strain (CARDIA)    Difficulty of Paying Living Expenses: Not hard at all  Food Insecurity: No Food Insecurity (05/23/2021)   Hunger Vital Sign    Worried About Running Out of Food in the Last Year: Never true    Ran Out of Food in the Last Year: Never true  Transportation Needs: No Transportation Needs (05/23/2021)   PRAPARE - Administrator, Civil Service (Medical): No    Lack of Transportation (Non-Medical): No  Physical Activity: Inactive (05/23/2021)   Exercise Vital Sign    Days of Exercise per  Week: 0 days    Minutes of Exercise per Session: 0 min  Stress: No Stress Concern Present (05/23/2021)   [employer] of Occupational Health - Occupational Stress Questionnaire    Feeling of Stress : Not at all  Social Connections: Moderately Isolated (05/23/2021)   Social Connection and Isolation Panel [NHANES]    Frequency of Communication with Friends and Family: More than three times a week    Frequency of Social Gatherings with Friends and Family: Twice a week    Attends Religious Services: More than 4 times per year    Active Member of [employer] or Organizations: No    Attends Banker Meetings: Never    Marital Status: Divorced  Catering manager Violence: Not At Risk (05/23/2021)   Humiliation, Afraid, Rape,  and Kick questionnaire    Fear of Current or Ex-Partner: No    Emotionally Abused: No    Physically Abused: No    Sexually Abused: No    Family History:   Family History  Problem Relation Age of Onset   Hypertension Mother    Hypertension Father    COPD Father    Sleep apnea Father    Breast cancer Paternal Aunt        59's   Brain cancer Paternal Aunt    Ovarian cancer Maternal Aunt    Colon cancer Neg Hx      ROS:  Please see the history of present illness.  Review of Systems  Unable to perform ROS: Intubated     Physical Exam/Data:   Vitals:   03/18/23 1000 03/18/23 1015 03/18/23 1059 03/18/23 1100  BP: (!) 89/65 115/66 116/63   Pulse:   62   Resp: 15 14 17    Temp: (!) 95.7 F (35.4 C) (!) 95.5 F (35.3 C) (!) 93.7 F (34.3 C)   TempSrc: Esophageal     SpO2: 100%  100% 100%    Intake/Output Summary (Last 24 hours) at 03/18/2023 1109 Last data filed at 03/18/2023 1000 Gross per 24 hour  Intake 4806.27 ml  Output 750 ml  Net 4056.27 ml      03/14/2023    2:27 PM 10/12/2021   11:56 AM 10/03/2021    2:26 PM  Last 3 Weights  Weight (lbs) 172 lb 172 lb 172 lb  Weight (kg) 78.019 kg 78.019 kg 78.019 kg     There is no height or weight  on file to calculate BMI.  General: 62 y.o. Caucasian female. Intubated and sedated. HEENT: Normocephalic and atraumatic. Sclera clear. Neck: Supple. No JVD. Heart: Borderline bradycardia with normal rhythm.  Distinct S1 and S2. No murmurs, gallops, or rubs.  Lungs: Intubated. Clear to ausculation anteriorly. Abdomen: Soft, non-distended, and non-tender to palpation.  Extremities: 1-2+ pitting edema of bilateral lower extremities.     Skin: Warm and dry. Neuro: Alert and oriented x3. No focal deficits. Psych: Normal affect. Responds appropriately.  EKG:  The EKG was personally reviewed and demonstrates:  Sinus tachycardia, rate 146 bpm, with mild diffuse ST depression (likely rate related).  Telemetry:  Telemetry was personally reviewed and demonstrates:  Sinus rhythm with rates in the high 40s to low 60s.  Relevant CV Studies:  Echocardiogram 03/17/2023: Impressions:  1. Left ventricular ejection fraction, by estimation, is 45 to 50%. The  left ventricle has mildly decreased function. The left ventricle  demonstrates global hypokinesis. Left ventricular diastolic parameters are  consistent with Grade II diastolic  dysfunction (pseudonormalization).   2. Right ventricular systolic function is normal. The right ventricular  size is normal. There is normal pulmonary artery systolic pressure. The  estimated right ventricular systolic pressure is 19.0 mmHg.   3. The mitral valve is grossly normal. Mild mitral valve regurgitation.   4. The aortic valve was not well visualized. Aortic valve regurgitation  is mild.   5. The inferior vena cava is normal in size with <50% respiratory  variability, suggesting right atrial pressure of 8 mmHg.   Laboratory Data:  High Sensitivity Troponin:   Recent Labs  Lab 03/16/23 1955 03/16/23 2317 03/17/23 0251  TROPONINIHS 145* 87* 185*     Chemistry Recent Labs  Lab 03/17/23 0251 03/17/23 0627 03/17/23 0907 03/18/23 0251  NA 155* 157* 154*  146*  K 2.4* 3.0* 3.6 3.0*  CL  123*  --  124* 117*  CO2 16*  --  19* 19*  GLUCOSE 173*  --  219* 223*  BUN 22*  --  21* 18  CREATININE 0.90  --  0.82 0.66  CALCIUM 7.1*  --  7.3* 6.7*  MG 2.0  --   --  1.7  GFRNONAA >60  --  >60 >60  ANIONGAP 16*  --  11 10    Recent Labs  Lab 03/16/23 1955 03/17/23 0251  PROT 7.5 4.7*  ALBUMIN 3.4* 2.2*  AST 49* 25  ALT 28 17  ALKPHOS 86 55  BILITOT 2.9* 1.5*   Lipids  Recent Labs  Lab 03/17/23 0251  TRIG 125    Hematology Recent Labs  Lab 03/16/23 1955 03/16/23 2000 03/17/23 0251 03/17/23 0627 03/18/23 0251  WBC 13.7*  --  10.6*  --  14.4*  RBC 5.73*  --  3.77*  --  3.38*  HGB 15.1*   < > 9.9* 9.5* 8.8*  HCT 46.6*   < > 30.7* 28.0* 26.7*  MCV 81.3  --  81.4  --  79.0*  MCH 26.4  --  26.3  --  26.0  MCHC 32.4  --  32.2  --  33.0  RDW 14.8  --  14.5  --  14.4  PLT 312  --  208  --  204   < > = values in this interval not displayed.   Thyroid  Recent Labs  Lab 03/18/23 0251  TSH 0.372    BNPNo results for input(s): "BNP", "PROBNP" in the last 168 hours.  DDimer No results for input(s): "DDIMER" in the last 168 hours.   Radiology/Studies:  DG Chest Port 1 View  Result Date: 03/18/2023 CLINICAL DATA:  Aspiration into airway. EXAM: PORTABLE CHEST 1 VIEW COMPARISON:  03/17/2023 FINDINGS: Endotracheal tube 2.5 cm above the carina. NG tube in the stomach. Heart and mediastinal contours within normal limits. No confluent opacity on the right. Focal opacity in the left lower lobe, similar to prior study. No effusions or pneumothorax. IMPRESSION: Support devices stable. Left lower lobe atelectasis or infiltrate, unchanged. Electronically Signed   By: Charlett Nose M.D.   On: 03/18/2023 00:09   ECHOCARDIOGRAM COMPLETE  Result Date: 03/17/2023    ECHOCARDIOGRAM REPORT   Patient Name:   Audrey Turner [facility] Date of Exam: 03/17/2023 Medical Rec #:  [id number]                  Height:       60.0 in Accession #:    [id number]                  Weight:       172.0 lb Date of Birth:  02-18-1961                  BSA:          1.751 m Patient Age:    62 years                   BP:           96/60 mmHg Patient Gender: F                          HR:           54 bpm. Exam Location:  Inpatient Procedure: 2D Echo, Cardiac Doppler and Color Doppler Indications:    Shock  History:  Patient has no prior history of Echocardiogram examinations.                 Cancer s/p chemo/radiation.  Sonographer:    Milda Smart Referring Phys: [id number] PETER E BABCOCK  Sonographer Comments: Echo performed with patient supine and on artificial respirator. Image acquisition challenging due to respiratory motion. IMPRESSIONS  1. Left ventricular ejection fraction, by estimation, is 45 to 50%. The left ventricle has mildly decreased function. The left ventricle demonstrates global hypokinesis. Left ventricular diastolic parameters are consistent with Grade II diastolic dysfunction (pseudonormalization).  2. Right ventricular systolic function is normal. The right ventricular size is normal. There is normal pulmonary artery systolic pressure. The estimated right ventricular systolic pressure is 19.0 mmHg.  3. The mitral valve is grossly normal. Mild mitral valve regurgitation.  4. The aortic valve was not well visualized. Aortic valve regurgitation is mild.  5. The inferior vena cava is normal in size with <50% respiratory variability, suggesting right atrial pressure of 8 mmHg. FINDINGS  Left Ventricle: Left ventricular ejection fraction, by estimation, is 45 to 50%. The left ventricle has mildly decreased function. The left ventricle demonstrates global hypokinesis. The left ventricular internal cavity size was normal in size. There is  no left ventricular hypertrophy. Left ventricular diastolic parameters are consistent with Grade II diastolic dysfunction (pseudonormalization). Right Ventricle: The right ventricular size is normal. Right ventricular systolic  function is normal. There is normal pulmonary artery systolic pressure. The tricuspid regurgitant velocity is 1.66 m/s, and with an assumed right atrial pressure of 8 mmHg,  the estimated right ventricular systolic pressure is 19.0 mmHg. Left Atrium: Left atrial size was normal in size. Right Atrium: Right atrial size was normal in size. Pericardium: There is no evidence of pericardial effusion. Mitral Valve: The mitral valve is grossly normal. Mild mitral valve regurgitation. Tricuspid Valve: Tricuspid valve regurgitation is mild. Aortic Valve: The aortic valve was not well visualized. Aortic valve regurgitation is mild. Pulmonic Valve: Pulmonic valve regurgitation is not visualized. Aorta: The aortic root and ascending aorta are structurally normal, with no evidence of dilitation. Venous: The inferior vena cava is normal in size with less than 50% respiratory variability, suggesting right atrial pressure of 8 mmHg. IAS/Shunts: No atrial level shunt detected by color flow Doppler.  LEFT VENTRICLE PLAX 2D LVIDd:         3.80 cm     Diastology LVIDs:         3.00 cm     LV e' medial:    5.11 cm/s LV PW:         0.80 cm     LV E/e' medial:  12.5 LV IVS:        0.70 cm     LV e' lateral:   7.07 cm/s LVOT diam:     2.00 cm     LV E/e' lateral: 9.0 LV SV:         48 LV SV Index:   27 LVOT Area:     3.14 cm  LV Volumes (MOD) LV vol d, MOD A4C: 49.5 ml LV vol s, MOD A4C: 20.4 ml LV SV MOD A4C:     49.5 ml RIGHT VENTRICLE            IVC RV S prime:     7.05 cm/s  IVC diam: 1.90 cm TAPSE (M-mode): 1.5 cm LEFT ATRIUM           Index  RIGHT ATRIUM          Index LA diam:      2.10 cm 1.20 cm/m  RA Area:     7.77 cm LA Vol (A2C): 15.1 ml 8.63 ml/m  RA Volume:   14.20 ml 8.11 ml/m LA Vol (A4C): 16.7 ml 9.54 ml/m  AORTIC VALVE LVOT Vmax:   59.30 cm/s LVOT Vmean:  43.800 cm/s LVOT VTI:    0.152 m  AORTA Ao Root diam: 2.50 cm Ao Asc diam:  2.50 cm MITRAL VALVE               TRICUSPID VALVE MV Area (PHT): 3.19 cm    TR  Peak grad:   11.0 mmHg MV Decel Time: 238 msec    TR Mean grad:   9.0 mmHg MR Peak grad: 50.8 mmHg    TR Vmax:        166.00 cm/s MR Vmax:      356.50 cm/s  TR Vmean:       148.0 cm/s MV E velocity: 63.90 cm/s MV A velocity: 38.80 cm/s  SHUNTS MV E/A ratio:  1.65        Systemic VTI:  0.15 m                            Systemic Diam: 2.00 cm Carolan Clines Electronically signed by Carolan Clines Signature Date/Time: 03/17/2023/4:00:28 PM    Final    DG FL GUIDED LUMBAR PUNCTURE  Result Date: 03/17/2023 CLINICAL DATA:  Patient admitted with acute metabolic encephalopathy due to sepsis and possibly meningitis. Bedside lumbar puncture attempt unsuccessful. Request for image guided lumbar puncture. EXAM: LUMBAR PUNCTURE UNDER FLUOROSCOPY PROCEDURE: Indications, risks, benefits and alternatives were discussed with the patient's brother Soundra Pilon via phone today who gives verbal consent to proceed. An appropriate skin entry site was determined fluoroscopically. Operator donned sterile gloves and mask. Skin site was marked, then prepped with Betadine, draped in usual sterile fashion, and infiltrated locally with 1% lidocaine. A 20 gauge spinal needle advanced into the thecal sac at L2-3 Clear colorless CSF spontaneously returned, with opening pressure of 15 cm water. 6 ml CSF were collected and divided among 4 sterile vials for the requested laboratory studies. The needle was then removed. The patient tolerated the procedure well and there were no complications. FLUOROSCOPY: Radiation Exposure Index (as provided by the fluoroscopic device): 7.6 mGy Kerma IMPRESSION: Technically successful lumbar puncture under fluoroscopy. This exam was performed by Lynnette Caffey, PA-C, and was supervised and interpreted by Malachy Moan, MD. Electronically Signed   By: Malachy Moan M.D.   On: 03/17/2023 15:32   Overnight EEG with video  Result Date: 03/17/2023 Charlsie Quest, MD     03/18/2023  6:24 AM Patient Name:  Clotine Dockery MRN: [id number] Epilepsy Attending: Charlsie Quest Referring Physician/Provider: Erick Blinks, MD Duration: 03/17/2023 0118 to 12/9/20204 0230 Patient history: 62yo M with breakthrough seizure getting eeg to evaluate for seizure Level of alertness: comatose AEDs during EEG study: LEV, propofol Technical aspects: This EEG study was done with scalp electrodes positioned according to the 10-20 International system of electrode placement. Electrical activity was reviewed with band pass filter of 1-70Hz , sensitivity of 7 uV/mm, display speed of 48mm/sec with a 60Hz  notched filter applied as appropriate. EEG data were recorded continuously and digitally stored.  Video monitoring was available and reviewed as appropriate. Description: At the beginning of the study, EEG showed burst  attenuation pattern with asynchronous bursts of generalized and lateralized left hemisphere 3-5hz  theta-delta slowing admixed with 13-15Hz  beta activity lasting 1-4 seconds alternating with 1-3 seconds of generalized attenuation. Gradually as sedation was weaned, EEG showed continuous generalized and lateralized left hemisphere 3-6Hz  theta-delta slowing, at times with triphasic morphology and periodic at 1-2Hz . EEG also showed breach artifact in vertex region with 3-5Hz  sharply contoured theta-delta slowing admixed with 12-14Hz  beta activity. Polyspikes were noted in left frontal, vertex region. Hyperventilation and photic stimulation were not performed.   EEG was disconnected between 03/17/2023 1300 to 1415 for fluoro guided LP. ABNORMALITY - Polyspikes, left frontal and vertex region. - Burst attenuation, generalized and lateralized left hemisphere - Breach artifact, vertex region IMPRESSION: This study showed evidence of epileptogenicity arising from  left frontal and vertex region. There was cortical dysfunction arising from left hemisphere likely secondary to underlying structural abnormality. Additionally  there was cortical dysfunction in vertex region consistent with underlying craniotomy. Lastly there was severe to profound diffuse encephalopathy related to sedation. As sedation was weaned, this improved to moderate to severe encephalopathy. No seizures were seen throughout the recording. Priyanka [name]   DG CHEST PORT 1 VIEW  Result Date: 03/17/2023 CLINICAL DATA:  Central line placement EXAM: PORTABLE CHEST 1 VIEW COMPARISON:  03/16/2023 FINDINGS: Right central line in place with the tip in the SVC. No pneumothorax. Endotracheal tube is 2 cm above the carina. NG tube tip in the distal esophagus near the GE junction. Recommend advancing several cm. Heart and mediastinal contours within normal limits. Left lower lobe airspace opacity could reflect atelectasis or infiltrate. No confluent opacity on the right. No effusions or acute bony abnormality. IMPRESSION: Support devices as above. Recommend advancing the NG tube several cm into the stomach. Left lower lobe atelectasis or infiltrate. Electronically Signed   By: Charlett Nose M.D.   On: 03/17/2023 03:53   DG Chest Port 1 View  Result Date: 03/16/2023 CLINICAL DATA:  Respiratory failure EXAM: PORTABLE CHEST 1 VIEW COMPARISON:  Film from earlier in the same day. FINDINGS: Cardiac shadows within normal limits. Endotracheal tube is noted in satisfactory position. Mild right basilar atelectasis is seen. No other focal abnormality is noted. IMPRESSION: Endotracheal tube in satisfactory position. Mild right basilar atelectasis. Electronically Signed   By: Alcide Clever M.D.   On: 03/16/2023 23:35   DG Chest Portable 1 View  Result Date: 03/16/2023 CLINICAL DATA:  Concern for sepsis.  Partial seizure. EXAM: PORTABLE CHEST 1 VIEW COMPARISON:  07/03/2016. FINDINGS: The heart size and mediastinal contours are within normal limits. No consolidation, effusion, or pneumothorax. No acute osseous abnormality. IMPRESSION: No active disease. Electronically Signed   By:  Thornell Sartorius M.D.   On: 03/16/2023 22:20   MR BRAIN WO CONTRAST  Result Date: 03/16/2023 CLINICAL DATA:  Encephalopathy EXAM: MRI HEAD WITHOUT CONTRAST TECHNIQUE: Multiplanar, multiecho pulse sequences of the brain and surrounding structures were obtained without intravenous contrast. COMPARISON:  01/06/2019 FINDINGS: Brain: No acute infarct, mass effect or extra-axial collection. Focus of hyperintensity at the lateral aspect of the left precentral gyrus on diffusion-weighted imaging is artifactual due to magnetic susceptibility effects. Chronic hemosiderin over the anterior left hemisphere. No acute hemorrhage. Ex vacuo dilatation of the left lateral ventricle with mild lateral and third ventriculomegaly. There is confluent hyperintense T2-weighted signal within the periventricular and deep white matter. The midline structures are normal. Vascular: Normal flow voids. Skull and upper cervical spine: Remote left craniectomy Sinuses/Orbits:No paranasal sinus fluid levels or advanced  mucosal thickening. No mastoid or middle ear effusion. Normal orbits. IMPRESSION: 1. No acute intracranial abnormality. 2. Remote left craniectomy with ex vacuo dilatation of the left lateral ventricle and mild lateral and third ventriculomegaly Electronically Signed   By: Deatra Robinson M.D.   On: 03/16/2023 20:49   CT HEAD CODE STROKE WO CONTRAST  Result Date: 03/16/2023 CLINICAL DATA:  Code stroke.  Acute neurologic deficit EXAM: CT HEAD WITHOUT CONTRAST TECHNIQUE: Contiguous axial images were obtained from the base of the skull through the vertex without intravenous contrast. RADIATION DOSE REDUCTION: This exam was performed according to the departmental dose-optimization program which includes automated exposure control, adjustment of the mA and/or kV according to patient size and/or use of iterative reconstruction technique. COMPARISON:  None Available. FINDINGS: Brain: Postsurgical changes of the left frontal lobe with  ballooning of the frontal horn of the left lateral ventricle, unchanged. Progression of bilateral white matter hypoattenuation and left frontal encephalomalacia. No acute hemorrhage. Vascular: No abnormal hyperdensity of the major intracranial arteries or dural venous sinuses. No intracranial atherosclerosis. Skull: Remote left-sided craniectomy. Sinuses/Orbits: No fluid levels or advanced mucosal thickening of the visualized paranasal sinuses. No mastoid or middle ear effusion. The orbits are normal. ASPECTS [facility] Stroke Program Early CT Score) - Ganglionic level infarction (caudate, lentiform nuclei, internal capsule, insula, M1-M3 cortex): 7 - Supraganglionic infarction (M4-M6 cortex): 3 Total score (0-10 with 10 being normal): 10 IMPRESSION: 1. No acute intracranial hemorrhage. 2. ASPECTS is 10. 3. Progression of bilateral white matter hypoattenuation and left frontal encephalomalacia. These results were communicated to Dr. Erick Blinks at 8:21 pm on 03/16/2023 by text page via the [facility] messaging system. Electronically Signed   By: Deatra Robinson M.D.   On: 03/16/2023 20:24     Assessment and Plan:   New Cardiomyopathy  Patient was admitted with sepsis secondary to pneumonia and  status epilepticus after presenting with altered mental status. Echo showed LVEF of 45-50% with global hypokinesis and grade 2 diastolic dysfunction, normal RV size and function, and mild MR.  She has some mild lower extremity edema but does not appear significantly volume overloaded. Suspect slightly reduced EF may be secondary to sepsis and status epilepticus. Can consider repeat Echo after she recovers from acute illness. Would hold off on GDMT for now given baseline bradycardia and soft BP.   Elevated Troponin High-sensitivity troponin 145 >> 87 >> 185. Initial EKG showed sinus tachycardia, rate 146 bpm, with mild diffuse ST depression (likely rate related). Patient is currently intubated but per chart review it does  not sounds like she was complaining of any chest pain. Echo showed LVEF of 45-50% with global hypokinesis. Troponin elevation consistent with demand ischemic in setting of sepsis and status epilepticus. No ischemic evaluation necessary at this time. Can repeat EKG now that patient is no longer tachycardic to make sure ST depression have resolved.  Otherwise, per primary team: - Sepsis secondary to pneumonia/ aspiration s/p intubated - Status epilepticus - Hypernatremia: sodium 159 on admission and 146 today - Hyperkalemia followed by hypokalemia: potassium 3.0 today - AKI: resolved - History of astrocytoma   Risk Assessment/Risk Scores:    New York Heart Association (NYHA) Functional Class Patietn is wheelchair bound.   For questions or updates, please contact [facility] Please consult [email or web address] for contact info under    Signed, Corrin Parker, PA-C  03/18/2023 11:09 AM  Personally seen and examined. Agree with above.  62 year old with newly discovered ejection fraction mildly reduced at  45 to 50% with global hypokinesis.  Minimally elevated troponin as well likely secondary to metabolic derangement/recent status epilepticus.  Currently intubated sedated.  No adverse arrhythmias noted on telemetry.  No further cardiac workup warranted.  Continue with current medical management.  We will sign off.  Please let us know if we can be of further assistance.  Donato Schultz, MD

## 2023-03-18 NOTE — Progress Notes (Signed)
[facility] ADULT ICU REPLACEMENT PROTOCOL   The patient does apply for the [facility] Adult ICU Electrolyte Replacment Protocol based on the criteria listed below:   1.Exclusion criteria: TCTS, ECMO, Dialysis, and Myasthenia Gravis patients 2. Is GFR >/= 30 ml/min? Yes.    Patient's GFR today is >60 3. Is SCr </= 2? Yes.   Patient's SCr is 0.66 mg/dL 4. Did SCr increase >/= 0.5 in 24 hours? No. 5.Pt's weight >40kg  Yes.   6. Abnormal electrolyte(s): K, Mag  7. Electrolytes replaced per protocol 8.  Call MD STAT for K+ </= 2.5, Phos </= 1, or Mag </= 1 Physician:  Azucena Freed [facility] 03/18/2023 3:55 AM

## 2023-03-18 NOTE — Progress Notes (Signed)
RT NOTE: patient placed on CPAP/PSV of 10/5 at 0720.  Tolerating well at this time.  Will continue to monitor and wean as tolerated.

## 2023-03-18 NOTE — Progress Notes (Addendum)
NAME:  Yaneris Cappo, MRN:  [id number], DOB:  27-Jan-1961, LOS: 2 ADMISSION DATE:  03/16/2023, CONSULTATION DATE:  12/7 REFERRING MD:  remy, CHIEF COMPLAINT:  acute encephalopathy    History of Present Illness:  62 year old female who is w/c bound at baseline w/ RUE contracted and RLE weakness but oriented x3 after neurosurgery in 2007.  Lives w/ and cared for by brother [name] 5-7d ago was having some trouble swallowing.  Following that over the following days was having runny nose, sore throat and congestions. Her speech quality worsened her brother though laryngitis. Over last 2d PTA decreased PO intake, having more trouble swallowing, not able to take in all her Keppra over last 24 hrs PTA. On 12/7 was at home w/ brother. He thought he witnessed a seizure and called EMS. She received versed on way to ER.  On arrival to ER pt was unresponsive w/ left gaze pref.  Initial ER eval  CT and MRI brain negative for acute changes. Did show remote craniectomy w/ex vacuo dilatation of left lateral ventricle She was loaded w/ Keppra Initial labs: cbg 124, INR 1.6, Na 159, CO2 19, BUN 26, cr 1.53, Trop 145. Lactate 4.1 Neg COVID Blood cultures sent Antibiotics started.  On CCM arrival still not responsive. Rigid w/ possible nuchal rigidity. Left gaze pref. 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  History of seizures on Keppra 100 mg p.o. twice daily. Depression At baseline in [facility] since surgery  Full fxn on left. Able to stand and pivot but weak right leg. Contracted right arm. She is A&O at baseline but needs assist w/ ADLs including eating Significant Hospital Events: Including procedures, antibiotic start and stop dates in addition to other pertinent events   12/7 admitted w/  sepsis and acute metabolic enceph w/ break thru seizures, PNA and possible meningitis started on vanc, ceftriaxone and azith also decadron   Interim History / Subjective:  Per RN, still requiring very low dose precedex for agitation on vent. No blood noted in stool.   Objective   Blood pressure 108/61, pulse 64, temperature (!) 96.6 F (35.9 C), resp. rate 19, SpO2 100%.    Vent Mode: PSV;CPAP FiO2 (%):  [40 %] 40 % Set Rate:  [12 bmp] 12 bmp Vt Set:  [350 mL] 350 mL PEEP:  [5 cmH20] 5 cmH20 Pressure Support:  [10 cmH20] 10 cmH20 Plateau Pressure:  [12 cmH20-20 cmH20] 20 cmH20   Intake/Output Summary (Last 24 hours) at 03/18/2023 0724 Last data filed at 03/18/2023 0865 Gross per 24 hour  Intake 4217.5 ml  Output 750 ml  Net 3467.5 ml   There were no vitals filed for this visit.  Examination: Gen: somnolent but awake, NAD HEENT:  EOMI, sclera anicteric, ETT Lungs:    CTAB anteriorly, tolerating vent  CV:  Mild brady, regular rhythm  Abd: bowel sounds present, soft, non distended, non tender Ext: 1+ edema bilateral feet Skin:   Warm and dry Neuro: able to barely shake head "no' when asked if in pain. unable to follow any commands/follow me with eyes. PERRL   Resolved Hospital Problem list   AKI  Assessment & Plan:  Acute metabolic encephalopathy multifactorial due to sepsis, status epilepticus  LP 12/8 - non concerning for meningitis (d/c decadron and vanc). B12,  TSH WNL.  Plan Neuro following, repeating head CT  F/u vit B1, RPR, HIV Continue antiepileptics per neurology Continue antibiotics  Status epilepticus prior h/o astrocytoma s/p resxn 2007 EEG 12/9 - no seizures seen, encephalopathy Plan Cont keppra as directed by neuro Continue LTM Seizure precautions Cont Prop and versed. Additional titration TBD by LTM  Acute respiratory failure secondary to pneumonia, aspiration Intubated for airway protection.  Plan Continue full vent support Continue precedex and  prn fentanyl for agitation  Follow intermittent chest x-ray F/u Respiratory culture Send u strep, legionella antigen as well as Resp virus panel Continue ceftriaxone and azithromycin.  Sepsis w/ lactic acidosis 2/2 PNA  Lactate has cleared. WBC 10.6> 14.4 in setting of steroids. Blood Cx grew staph only 1/4 - likely contaminant. No longer requiring levophed for pressure support.  Plan Repeat blood cx Monitor WBC  Fluid and electrolyte imbalance: hypernatremia, hyperkalemia followed by hypokalemia Now s/p volume resuscitation  Plan Continue free water flushes, starting tube feeds, RD consulted Watch for re-feeding once tube feeds start K and mag repleted today Daily labs - twice daily once tube feeds start   AKI secondary to sepsis  Resolved CTM BMP  Anemia Hgb continues to downtrend, 15.6 on admission to 8.8 today.  No obvious source of bleeding.  CTM with daily CBC Transfusion threshold <8  Mild elevation of troponin Trops 87>185 12/8. Echo 12/8 - LVEF 45-50%, G2DD Plan Cont tele Repeat trop this morning  Consult cards for new heart failure  Depression Continue home alt for home pristiq, abilify and amantadine   Best Practice (right click and "Reselect all SmartList Selections" daily)   Diet/type: NPO, OG tube for feeds and meds DVT prophylaxis SCD Pressure ulcer(s): N/A GI prophylaxis: H2B Lines: CVC Foley:  Yes, and it is still needed Code Status:  full code Last date of multidisciplinary goals of care discussion [brother [name] at bedside 12/8. Requested full code and aggressive are]  [facility], PGY-3

## 2023-03-18 NOTE — Progress Notes (Signed)
Initial Nutrition Assessment  DOCUMENTATION CODES:  Not applicable  INTERVENTION:  Initiate tube feeding via OGT: Start Osmolite 1.2 at 20 ml/h advancing by 10ml q8h to a goal rate of 33ml/hr (1200 ml per day) TF at goal provides 1440 kcal, 67 gm protein, 984 ml free water daily  Juven BID per tube to support wound healing  Continue thiamine to reduce risk of refeeding syndrome  Monitor magnesium and phosphorus every 12 hours x 4 occurrences, MD to replete as needed, as pt is at risk for refeeding syndrome given progressive decline in PO itnake  Request updated measured weight  NUTRITION DIAGNOSIS:  Inadequate oral intake related to acute illness as evidenced by NPO status.  GOAL:  Patient will meet greater than or equal to 90% of their needs   MONITOR:  Labs, Vent status, Weight trends, TF tolerance, Skin  REASON FOR ASSESSMENT:  Ventilator, Consult Enteral/tube feeding initiation and management  ASSESSMENT:  Pt admitted with metabolic encephalopathy secondary to sepsis and PNA. PMH significant for astrocytoma s/p chemo, radiation and surgery (2007) subsequent seizure disorder.  12/7 admitted, intubated 12/8 s/p LP with IR; negative for infectious factors  Pt awake, very small nod to yes/no to few questions but unable to provide meaningful nutrition related history. No family present at bedside.   Per review of Neurology note "having cold like symptoms that developed last week, trouble swallowing liquids over the weekend that progressed to trouble swallowing solids early this week. Has not been eating and drinking. Brother took her to PCP, tested for strep and was negative. Symptoms felt to be due to post nasal drip. Was able to eat soft foods like mashed potatos but not been able to tolerate liquids. She continued to decline. "  Patient is currently intubated on ventilator support MV: 6.8 L/min Temp (24hrs), Avg:96.8 F (36 C), Min:93.4 F (34.1 C), Max:99 F (37.2  C)  Admit weight: none documented  Current weight: none documented No history on file to review over the last year to assess for significant weight changes.    Intake/Output Summary (Last 24 hours) at 03/18/2023 1236 Last data filed at 03/18/2023 1100 Gross per 24 hour  Intake 4519.62 ml  Output 750 ml  Net 3769.62 ml   Net IO Since Admission: 6,940.75 mL [03/18/23 1236]  Drains/Lines: OGT (tip/side port within the stomach)  Nutritionally Relevant Medications: Scheduled Meds:  amantadine  100 mg Per Tube BID   ARIPiprazole  2.5 mg Per Tube Daily   Chlorhexidine Gluconate Cloth  6 each Topical Daily   docusate  100 mg Per Tube BID   famotidine  20 mg Per Tube BID   free water  200 mL Per Tube Q4H   heparin injection (subcutaneous)  5,000 Units Subcutaneous Q8H   insulin aspart  0-15 Units Subcutaneous Q4H   levETIRAcetam  1,000 mg Per Tube BID   nutrition supplement (JUVEN)  1 packet Oral BID BM   mouth rinse  15 mL Mouth Rinse Q2H   polyethylene glycol  17 g Per Tube Daily   thiamine (VITAMIN B1) injection  100 mg Intravenous Daily   venlafaxine  37.5 mg Per Tube BID   Continuous Infusions:  [START ON 03/19/2023] azithromycin     [START ON 03/19/2023] cefTRIAXone (ROCEPHIN)  IV     dexmedetomidine (PRECEDEX) IV infusion 0.1 mcg/kg/hr (03/18/23 1100)   feeding supplement (OSMOLITE 1.2 CAL)     PRN Meds:.docusate sodium, fentaNYL (SUBLIMAZE) injection, fentaNYL (SUBLIMAZE) injection, mouth rinse, polyethylene glycol  Labs  Reviewed: Sodium 146 Potassium 3.0 -repletion ordered Ca 6.7 Mg 1.7 (wdl but low side of range)- repletion ordered CBG ranges from 159-228 mg/dL over the last 24 hours  NUTRITION - FOCUSED PHYSICAL EXAM: Flowsheet Row Most Recent Value  Orbital Region Unable to assess  Upper Arm Region Unable to assess  Thoracic and Lumbar Region No depletion  Buccal Region Unable to assess  Temple Region Moderate depletion  Clavicle Bone Region Unable to  assess  [mild edema]  Clavicle and Acromion Bone Region Mild depletion  Scapular Bone Region Moderate depletion  Dorsal Hand Unable to assess  [severe edema]  Patellar Region Mild depletion  Anterior Thigh Region Mild depletion  Posterior Calf Region Unable to assess  [moderate pitting BLE]  Edema (RD Assessment) Moderate  [mild-moderate generalized edema,  severe BUE, moderate BLE]  Hair Reviewed  Eyes Reviewed  Mouth Unable to assess  Skin Other (Comment)  [pale]  Nails Unable to assess  [contracted]   Diet Order:   Diet Order             Diet NPO time specified  Diet effective now                   EDUCATION NEEDS:  No education needs have been identified at this time  Skin:  Skin Assessment: Skin Integrity Issues: Skin Integrity Issues:: Unstageable Unstageable: R sacrum  Last BM:  12/9 x2 type 6 and smear  Height:  Ht Readings from Last 1 Encounters:  03/14/23 5' (1.524 m)   Weight:  Wt Readings from Last 1 Encounters:  03/14/23 78 kg   Ideal Body Weight:  45.5 kg  BMI:  There is no height or weight on file to calculate BMI.  Estimated Nutritional Needs:  Kcal:  1400-1600 Protein:  65-80g Fluid:  >/=1.5L  Drusilla Kanner, RDN, LDN Clinical Nutrition

## 2023-03-18 NOTE — Progress Notes (Signed)
PHARMACY - PHYSICIAN COMMUNICATION CRITICAL VALUE ALERT - BLOOD CULTURE IDENTIFICATION (BCID)  Audrey Turner is an 62 y.o. female who presented to [facility] on 03/16/2023 with a chief complaint of  altered mental status/ concerns for pneumonia and meningitis.  -1/4 Bcx: staph spp with no gene resistance -WBC trending down, sCr improved from 1.1 > 0.82, afebrile -CSF fluid: WBC 2, RBC 8, protein 33, glucose 106, colorless -CSF Cx: gram stain shows no organism -Resp panel negative   Name of physician (or Provider) Contacted: Dr. Valora Piccolo  Current antibiotics: Ceftriaxone 2g IV every 12 hours, Vancomycin 750mg  IV every 12 hours, azithromycin 500mg  IV every 24 hours   Changes to prescribed antibiotics recommended:   -No changes at this time  Results for orders placed or performed during the hospital encounter of 03/16/23  Blood Culture ID Panel (Reflexed) (Collected: 03/16/2023  8:01 PM)  Result Value Ref Range   Enterococcus faecalis NOT DETECTED NOT DETECTED   Enterococcus Faecium NOT DETECTED NOT DETECTED   Listeria monocytogenes NOT DETECTED NOT DETECTED   Staphylococcus species DETECTED (A) NOT DETECTED   Staphylococcus aureus (BCID) NOT DETECTED NOT DETECTED   Staphylococcus epidermidis NOT DETECTED NOT DETECTED   Staphylococcus lugdunensis NOT DETECTED NOT DETECTED   Streptococcus species NOT DETECTED NOT DETECTED   Streptococcus agalactiae NOT DETECTED NOT DETECTED   Streptococcus pneumoniae NOT DETECTED NOT DETECTED   Streptococcus pyogenes NOT DETECTED NOT DETECTED   A.calcoaceticus-baumannii NOT DETECTED NOT DETECTED   Bacteroides fragilis NOT DETECTED NOT DETECTED   Enterobacterales NOT DETECTED NOT DETECTED   Enterobacter cloacae complex NOT DETECTED NOT DETECTED   Escherichia coli NOT DETECTED NOT DETECTED   Klebsiella aerogenes NOT DETECTED NOT DETECTED   Klebsiella oxytoca NOT DETECTED NOT DETECTED   Klebsiella pneumoniae NOT DETECTED NOT DETECTED    Proteus species NOT DETECTED NOT DETECTED   Salmonella species NOT DETECTED NOT DETECTED   Serratia marcescens NOT DETECTED NOT DETECTED   Haemophilus influenzae NOT DETECTED NOT DETECTED   Neisseria meningitidis NOT DETECTED NOT DETECTED   Pseudomonas aeruginosa NOT DETECTED NOT DETECTED   Stenotrophomonas maltophilia NOT DETECTED NOT DETECTED   Candida albicans NOT DETECTED NOT DETECTED   Candida auris NOT DETECTED NOT DETECTED   Candida glabrata NOT DETECTED NOT DETECTED   Candida krusei NOT DETECTED NOT DETECTED   Candida parapsilosis NOT DETECTED NOT DETECTED   Candida tropicalis NOT DETECTED NOT DETECTED   Cryptococcus neoformans/gattii NOT DETECTED NOT DETECTED    Arabella Merles, PharmD. Clinical Pharmacist 03/18/2023 2:39 AM

## 2023-03-18 NOTE — Progress Notes (Signed)
S: Called by [facility] regarding EEG abnormalities.   O: BP 134/73 (BP Location: Right Arm)   Pulse 85   Temp (!) 95.9 F (35.5 C) (Esophageal)   Resp 18   SpO2 100%   Ment: Alerts to voice. Patient able to follow command to squeeze with left hand and track to left and right.  CN: Able to gaze to left and right. Fixates on examiner's face when asked.  Motor: Weak squeeze with LUE to command. Not moving RUE to command.  Other: No clinical seizure activity noted during exam.   LTM EEG reviewed at the bedside: Diffuse slowing with intermittent sharp waves. No electrographic seizures seen.   LTM EEG report by Dr. Melynda Ripple from today has been reviewed. Findings consisted of left frontal and vertex polyspikes. Continuous slow, generalized and lateralized left hemisphere, as well as breach artifact, vertex region.    Current Facility-Administered Medications:    amantadine (SYMMETREL) 50 MG/5ML solution 100 mg, 100 mg, Per Tube, BID, Mannam, Praveen, MD, 100 mg at 03/18/23 2107   ARIPiprazole (ABILIFY) tablet 2.5 mg, 2.5 mg, Per Tube, Daily, Mannam, Praveen, MD, 2.5 mg at 03/18/23 1054   [START ON 03/19/2023] azithromycin (ZITHROMAX) 500 mg in sodium chloride 0.9 % 250 mL IVPB, 500 mg, Intravenous, Q24H, Jones, Sarah, DO   [START ON 03/19/2023] cefTRIAXone (ROCEPHIN) 2 g in sodium chloride 0.9 % 100 mL IVPB, 2 g, Intravenous, Q24H, Mannam, Praveen, MD   Chlorhexidine Gluconate Cloth 2 % PADS 6 each, 6 each, Topical, Daily, Assaker, Jean-Pierre, MD, 6 each at 03/18/23 0851   dexmedetomidine (PRECEDEX) 400 MCG/100ML (4 mcg/mL) infusion, 0-1.2 mcg/kg/hr, Intravenous, Titrated, Mannam, Praveen, MD, Last Rate: 1.95 mL/hr at 03/18/23 2000, 0.1 mcg/kg/hr at 03/18/23 2000   docusate (COLACE) 50 MG/5ML liquid 100 mg, 100 mg, Per Tube, BID, Simonne Martinet, NP, 100 mg at 03/18/23 2106   docusate sodium (COLACE) capsule 100 mg, 100 mg, Oral, BID PRN, Simonne Martinet, NP   famotidine (PEPCID) tablet 20 mg,  20 mg, Per Tube, BID, Mannam, Praveen, MD, 20 mg at 03/18/23 2107   feeding supplement (OSMOLITE 1.2 CAL) liquid 1,000 mL, 1,000 mL, Per Tube, Continuous, Mannam, Praveen, MD, Last Rate: 20 mL/hr at 03/18/23 2000, Infusion Verify at 03/18/23 2000   fentaNYL (SUBLIMAZE) injection 50 mcg, 50 mcg, Intravenous, Q15 min PRN, Simonne Martinet, NP   fentaNYL (SUBLIMAZE) injection 50-200 mcg, 50-200 mcg, Intravenous, Q30 min PRN, Simonne Martinet, NP   free water 200 mL, 200 mL, Per Tube, Q4H, Zenia Resides E, NP, 200 mL at 03/18/23 1937   heparin injection 5,000 Units, 5,000 Units, Subcutaneous, Q8H, Simonne Martinet, NP, 5,000 Units at 03/18/23 2107   insulin aspart (novoLOG) injection 0-15 Units, 0-15 Units, Subcutaneous, Q4H, Larinda Buttery, MD, 3 Units at 03/18/23 0834   levETIRAcetam (KEPPRA) 100 MG/ML solution 1,000 mg, 1,000 mg, Per Tube, BID, Mannam, Praveen, MD, 1,000 mg at 03/18/23 2107   nutrition supplement (JUVEN) (JUVEN) powder packet 1 packet, 1 packet, Per Tube, BID BM, Mannam, Praveen, MD, 1 packet at 03/18/23 1428   Oral care mouth rinse, 15 mL, Mouth Rinse, Q2H, Simonne Martinet, NP, 15 mL at 03/18/23 2108   Oral care mouth rinse, 15 mL, Mouth Rinse, PRN, Simonne Martinet, NP   polyethylene glycol (MIRALAX / GLYCOLAX) packet 17 g, 17 g, Per Tube, Daily, Simonne Martinet, NP   polyethylene glycol (MIRALAX / GLYCOLAX) packet 17 g, 17 g, Oral, Daily PRN, Simonne Martinet, NP  thiamine (VITAMIN B1) injection 100 mg, 100 mg, Intravenous, Daily, Rejeana Brock, MD, 100 mg at 03/18/23 1041   venlafaxine [facility]) tablet 37.5 mg, 37.5 mg, Per Tube, BID, Mannam, Praveen, MD, 37.5 mg at 03/18/23 6426   A/R: 62 year old female who presented with breakthrough seizures in the setting of likely missed Keppra dosing due to difficulty swallowing.  She has been encephalopathic, attributed to her multiple metabolic abnormalities.  LP is reassuring that CNS infection is not playing a role in her  current presentation. - Repeat exam tonight shows no clinical seizure activity.  - She still has significant irritability on EEG, sharp waves that sometimes seem quasi periodic, but no definite seizures were noted.   - Continue LTM EEG - Continue Keppra at 1000 mg BID  Electronically signed: Dr. Caryl Pina

## 2023-03-18 NOTE — Procedures (Signed)
Patient Name: Audrey Turner  MRN: [id number]  Epilepsy Attending: Charlsie Quest  Referring Physician/Provider: Erick Blinks, MD  Duration: 03/18/2023 0230 to 03/19/2023 0230   Patient history: 62yo M with breakthrough seizure getting eeg to evaluate for seizure   Level of alertness: lethargic   AEDs during EEG study: LEV   Technical aspects: This EEG study was done with scalp electrodes positioned according to the 10-20 International system of electrode placement. Electrical activity was reviewed with band pass filter of 1-70Hz , sensitivity of 7 uV/mm, display speed of 52mm/sec with a 60Hz  notched filter applied as appropriate. EEG data were recorded continuously and digitally stored.  Video monitoring was available and reviewed as appropriate.   Description: EEG showed continuous generalized and lateralized left hemisphere 3-6Hz  theta-delta slowing, at times with triphasic morphology and periodic at 1-2Hz . EEG also showed breach artifact in vertex region with 3-5Hz  sharply contoured theta-delta slowing admixed with 12-14Hz  beta activity. Polyspikes were noted in left frontal, vertex region. Hyperventilation and photic stimulation were not performed.      ABNORMALITY - Polyspikes, left frontal and vertex region. - Continuous slow, generalized and lateralized left hemisphere - Breach artifact, vertex region   IMPRESSION: This study showed evidence of epileptogenicity arising from  left frontal and vertex region. There was cortical dysfunction arising from left hemisphere likely secondary to underlying structural abnormality. Additionally there was cortical dysfunction in vertex region consistent with underlying craniotomy. Lastly there was moderate to severe encephalopathy. No seizures were seen throughout the recording.   Jayde Daffin [name]

## 2023-03-18 NOTE — Progress Notes (Signed)
NEUROLOGY CONSULT FOLLOW UP NOTE   Date of service: March 18, 2023 Patient Name: Audrey Turner MRN:  [id number] DOB:  April 14, 1960  Brief HPI  62 yo F with a hx of astrocytoma in 2007 and subsequent sz disorder who presented on 12/07 with encephalopathy after a subacute decline in ability to swallow over the week prior. Was loaded with keppra   Interval Hx/subjective   LP reassuring, no ongoing sz on EEG.   Vitals   Vitals:   03/18/23 0545 03/18/23 0600 03/18/23 0615 03/18/23 0630  BP: 100/60 (!) 97/56 (!) 93/55 (!) 92/52  Pulse:      Resp: 14 13 14 13   Temp: (!) 97.3 F (36.3 C) (!) 96.6 F (35.9 C) (!) 96.6 F (35.9 C) (!) 96.6 F (35.9 C)  TempSrc:      SpO2:           Physical Exam   General: Laying in bed; intubated    Neurologic Examination  Mental status/Cognition: Opens eyes partially to noxious stimulation, she does follow commands to wiggle toes, but not show thumbs Speech/language: Follows commands to wiggle toes as above Cranial nerves:   CN II Pupils equal and reactive to light   CN III,IV,VI She has a leftward gaze, does come back towards midline, but does not cross midline to the right   CN V Corneals absent   CN VII Face grossly symmetric   CN VIII Follows command   CN IX & X Cough intact to suction    CN XI Head midline   CN XII Unable to assess secondary to ETT    Sensory/Motor:  Tone is increased in bilateral upper extremities, she withdraws to nox stim x 4   Coordination/Complex Motor:  Unable to assess.  Medications  Current Facility-Administered Medications:    azithromycin (ZITHROMAX) 500 mg in sodium chloride 0.9 % 250 mL IVPB, 500 mg, Intravenous, Q24H, Assaker, Jean-Pierre, MD, Paused at 03/18/23 0159   cefTRIAXone (ROCEPHIN) 2 g in sodium chloride 0.9 % 100 mL IVPB, 2 g, Intravenous, Q12H, Arabella Merles, RPH, Paused at 03/18/23 4098   Chlorhexidine Gluconate Cloth 2 % PADS 6 each, 6 each, Topical, Daily, Assaker,  Jean-Pierre, MD, 6 each at 03/17/23 0120   dexamethasone (DECADRON) injection 10 mg, 10 mg, Intravenous, Q6H, Simonne Martinet, NP, 10 mg at 03/18/23 0544   dexmedetomidine (PRECEDEX) 400 MCG/100ML (4 mcg/mL) infusion, 0-1.2 mcg/kg/hr, Intravenous, Titrated, Mannam, Praveen, MD, Last Rate: 1.95 mL/hr at 03/18/23 0600, 0.1 mcg/kg/hr at 03/18/23 0600   docusate (COLACE) 50 MG/5ML liquid 100 mg, 100 mg, Per Tube, BID, Simonne Martinet, NP, 100 mg at 03/17/23 2203   docusate sodium (COLACE) capsule 100 mg, 100 mg, Oral, BID PRN, Simonne Martinet, NP   famotidine (PEPCID) tablet 20 mg, 20 mg, Per Tube, BID, Mannam, Praveen, MD   fentaNYL (SUBLIMAZE) injection 50 mcg, 50 mcg, Intravenous, Q15 min PRN, Simonne Martinet, NP   fentaNYL (SUBLIMAZE) injection 50-200 mcg, 50-200 mcg, Intravenous, Q30 min PRN, Simonne Martinet, NP   free water 200 mL, 200 mL, Per Tube, Q4H, Zenia Resides E, NP, 200 mL at 03/18/23 0302   heparin injection 5,000 Units, 5,000 Units, Subcutaneous, Q8H, Simonne Martinet, NP, 5,000 Units at 03/18/23 0543   insulin aspart (novoLOG) injection 0-15 Units, 0-15 Units, Subcutaneous, Q4H, Larinda Buttery, MD, 3 Units at 03/18/23 0541   levETIRAcetam (KEPPRA) 100 MG/ML solution 1,000 mg, 1,000 mg, Per Tube, BID, Mannam, Praveen, MD   midazolam (VERSED)  100 mg/100 mL (1 mg/mL) premix infusion, 2 mg/hr, Intravenous, Continuous, Simonne Martinet, NP, Stopping previously hung infusion at 03/17/23 1900   midazolam (VERSED) injection 4 mg, 4 mg, Intravenous, Once, Simonne Martinet, NP   norepinephrine (LEVOPHED) 4mg  in (0.016 mg/mL) premix infusion, 2-10 mcg/min, Intravenous, Titrated, Simonne Martinet, NP, Stopped at 03/18/23 0257   Oral care mouth rinse, 15 mL, Mouth Rinse, Q2H, Simonne Martinet, NP, 15 mL at 03/18/23 0543   Oral care mouth rinse, 15 mL, Mouth Rinse, PRN, Simonne Martinet, NP   polyethylene glycol (MIRALAX / GLYCOLAX) packet 17 g, 17 g, Per Tube, Daily, Simonne Martinet,  NP   polyethylene glycol (MIRALAX / GLYCOLAX) packet 17 g, 17 g, Oral, Daily PRN, Simonne Martinet, NP   potassium chloride (KLOR-CON) packet 20 mEq, 20 mEq, Per Tube, Q4H, Larinda Buttery, MD, 20 mEq at 03/18/23 0423   potassium chloride 10 mEq in 50 mL *CENTRAL LINE* IVPB, 10 mEq, Intravenous, Q1 Hr x 4, Yap, Erie Noe, MD, Last Rate: 50 mL/hr at 03/18/23 0639, 10 mEq at 03/18/23 0639   rocuronium [facility]) injection 100 mg, 100 mg, Intravenous, Once, Simonne Martinet, NP   vancomycin [facility]) IVPB 750 mg/150 mL, 750 mg, Intravenous, Donell Sievert, [facility], Stopped at 03/18/23 0015 Labs and Diagnostic Imaging    Basic Metabolic Panel: Recent Labs  Lab 03/16/23 1955 03/16/23 2000 03/17/23 0047 03/17/23 0251 03/17/23 0627 03/17/23 0907 03/17/23 1945 03/18/23 0251  NA 159* 158* 156* 155* 157* 154*  --  146*  K 4.7 5.4* 2.6* 2.4* 3.0* 3.6  --  3.0*  CL 115* 118*  --  123*  --  124*  --  117*  CO2 19*  --   --  16*  --  19*  --  19*  GLUCOSE 134* 132*  --  173*  --  219*  --  223*  BUN 26* 43*  --  22*  --  21*  --  18  CREATININE 1.53* 1.10*  --  0.90  --  0.82  --  0.66  CALCIUM 9.2  --   --  7.1*  --  7.3*  --  6.7*  MG  --   --   --  2.0  --   --   --  1.7  PHOS  --   --   --  1.6*  --   --  3.6 2.9    CBC: Recent Labs  Lab 03/16/23 1955 03/16/23 2000 03/17/23 0047 03/17/23 0251 03/17/23 0627 03/18/23 0251  WBC 13.7*  --   --  10.6*  --  14.4*  NEUTROABS 11.6*  --   --  8.3*  --   --   HGB 15.1* 15.6* 10.2* 9.9* 9.5* 8.8*  HCT 46.6* 46.0 30.0* 30.7* 28.0* 26.7*  MCV 81.3  --   --  81.4  --  79.0*  PLT 312  --   --  208  --  204    Coagulation Studies: Recent Labs    03/16/23 1955  LABPROT 19.6*  INR 1.6*      Lipid Panel:  Lab Results  Component Value Date   LDLCALC 94 05/30/2021   HgbA1c:  Lab Results  Component Value Date   HGBA1C 4.9 05/30/2021   Urine Drug Screen:     Component Value Date/Time   LABOPIA NONE DETECTED 03/16/2023 2102    COCAINSCRNUR NONE DETECTED 03/16/2023 2102   LABBENZ POSITIVE (A) 03/16/2023 2102  AMPHETMU NONE DETECTED 03/16/2023 2102   THCU NONE DETECTED 03/16/2023 2102   LABBARB NONE DETECTED 03/16/2023 2102    Alcohol Level     Component Value Date/Time   ETH <10 03/16/2023 1955   TSH nml B12 812 B1 pending Rpr pending Hiv pending  Assessment   62 year old female who presented with breakthrough seizures in the setting of likely missed Keppra dosing due to difficulty swallowing.  She has been encephalopathic, attributed to her multiple metabolic abnormalities.  LP is reassuring that CNS infection is not playing a role in her current presentation.  She still has significant irritability on EEG, sharp waves that sometimes seem quasi periodic, but no definite seizures were noted.  I would favor continuing to monitor as she continues to improve.  Recommendations  - Overnight LTM EEG - Keppra 1 g twice daily - No need for Decadron if being used for meningitis coverage, I will discontinue this - Can discontinue meningitic antibiotic coverage(she has a procalcitonin of three, so will defer to CCM for which antibiotics to de-escalate) - will follow   ______________________________________________________________________  Ritta Slot, MD [facility] [phone]  If 7pm- 7am, please page neurology on call as listed in AMION.

## 2023-03-18 NOTE — Progress Notes (Signed)
LTM maint complete - no skin breakdown under: C4,FZ, pt skin dry and scaly. Skin does lift at time of maint. RN notified and let me know for her skin integrity this is expected.

## 2023-03-18 NOTE — Progress Notes (Signed)
eLink Physician-Brief Progress Note Patient Name: Audrey Turner DOB: [birth year] MRN: [id number]   Date of Service  03/18/2023  HPI/Events of Note  Notified of hyperglycemia with glucose at 210.  Pt remains intubated.   eICU Interventions  Change POC glucose to q4hrs.  Insulin sliding scale changed to q4hrs as well.      Intervention Category Intermediate Interventions: Hyperglycemia - evaluation and treatment  Larinda Buttery 03/18/2023, 4:44 AM

## 2023-03-19 LAB — BASIC METABOLIC PANEL
Anion gap: 6 (ref 5–15)
Anion gap: 6 (ref 5–15)
BUN: 26 mg/dL — ABNORMAL HIGH (ref 6–20)
BUN: 31 mg/dL — ABNORMAL HIGH (ref 6–20)
CO2: 22 mmol/L (ref 22–32)
CO2: 23 mmol/L (ref 22–32)
Calcium: 7.8 mg/dL — ABNORMAL LOW (ref 8.9–10.3)
Calcium: 8 mg/dL — ABNORMAL LOW (ref 8.9–10.3)
Chloride: 115 mmol/L — ABNORMAL HIGH (ref 98–111)
Chloride: 115 mmol/L — ABNORMAL HIGH (ref 98–111)
Creatinine, Ser: 0.6 mg/dL (ref 0.44–1.00)
Creatinine, Ser: 0.61 mg/dL (ref 0.44–1.00)
GFR, Estimated: >60 mL/min (ref >60)
GFR, Estimated: >60 mL/min (ref >60)
Glucose, Bld: 144 mg/dL — ABNORMAL HIGH (ref 70–99)
Glucose, Bld: 222 mg/dL — ABNORMAL HIGH (ref 70–99)
Potassium: 4.3 mmol/L (ref 3.5–5.1)
Potassium: 3.7 mmol/L (ref 3.5–5.1)
Sodium: 143 mmol/L (ref 135–145)
Sodium: 144 mmol/L (ref 135–145)

## 2023-03-19 LAB — GLUCOSE, CAPILLARY
Glucose-Capillary: 134 mg/dL — ABNORMAL HIGH (ref 70–99)
Glucose-Capillary: 130 mg/dL — ABNORMAL HIGH (ref 70–99)
Glucose-Capillary: 200 mg/dL — ABNORMAL HIGH (ref 70–99)
Glucose-Capillary: 229 mg/dL — ABNORMAL HIGH (ref 70–99)
Glucose-Capillary: 212 mg/dL — ABNORMAL HIGH (ref 70–99)

## 2023-03-19 LAB — PHOSPHORUS
Phosphorus: 2.2 mg/dL — ABNORMAL LOW (ref 2.5–4.6)
Phosphorus: 2.4 mg/dL — ABNORMAL LOW (ref 2.5–4.6)

## 2023-03-19 LAB — CULTURE, BLOOD (ROUTINE X 2): Culture  Setup Time: NO GROWTH

## 2023-03-19 LAB — CBC
HCT: 28.3 % — ABNORMAL LOW (ref 36.0–46.0)
Hemoglobin: 9.5 g/dL — ABNORMAL LOW (ref 12.0–15.0)
MCH: 25.8 pg — ABNORMAL LOW (ref 26.0–34.0)
MCHC: 33.6 g/dL (ref 30.0–36.0)
MCV: 76.9 fL — ABNORMAL LOW (ref 80.0–100.0)
Platelets: 197 10*3/uL (ref 150–400)
RBC: 3.68 MIL/uL — ABNORMAL LOW (ref 3.87–5.11)
RDW: 13.9 % (ref 11.5–15.5)
WBC: 16.9 10*3/uL — ABNORMAL HIGH (ref 4.0–10.5)
nRBC: 0.2 % (ref 0.0–0.2)

## 2023-03-19 LAB — PROCALCITONIN: Procalcitonin: 2.05 ng/mL

## 2023-03-19 LAB — HIV ANTIBODY (ROUTINE TESTING W REFLEX): HIV Screen 4th Generation wRfx: NONREACTIVE

## 2023-03-19 LAB — MAGNESIUM
Magnesium: 2.3 mg/dL (ref 1.7–2.4)
Magnesium: 2.3 mg/dL (ref 1.7–2.4)

## 2023-03-19 MED ORDER — SODIUM PHOSPHATES 45 MMOLE/15ML IV SOLN
10.0000 mmol | Freq: Once | INTRAVENOUS | Status: AC
  Administered 2023-03-19: 10 mmol via INTRAVENOUS
  Filled 2023-03-19: qty 3.33

## 2023-03-19 MED ORDER — SODIUM CHLORIDE 0.9 % IV SOLN
2.0000 g | INTRAVENOUS | Status: DC
  Administered 2023-03-20: 2 g via INTRAVENOUS
  Filled 2023-03-19: qty 20

## 2023-03-19 MED ORDER — INSULIN ASPART 100 UNIT/ML IJ SOLN
0.0000 [IU] | INTRAMUSCULAR | Status: DC
  Administered 2023-03-19: 5 [IU] via SUBCUTANEOUS
  Administered 2023-03-20: 2 [IU] via SUBCUTANEOUS
  Administered 2023-03-20: 3 [IU] via SUBCUTANEOUS
  Administered 2023-03-20: 2 [IU] via SUBCUTANEOUS
  Administered 2023-03-20: 5 [IU] via SUBCUTANEOUS
  Administered 2023-03-20: 3 [IU] via SUBCUTANEOUS
  Administered 2023-03-21 (×3): 2 [IU] via SUBCUTANEOUS
  Administered 2023-03-22: 3 [IU] via SUBCUTANEOUS
  Administered 2023-03-22: 5 [IU] via SUBCUTANEOUS
  Administered 2023-03-22 (×2): 3 [IU] via SUBCUTANEOUS
  Administered 2023-03-23: 2 [IU] via SUBCUTANEOUS
  Administered 2023-03-23 (×2): 3 [IU] via SUBCUTANEOUS
  Administered 2023-03-23 – 2023-03-24 (×4): 2 [IU] via SUBCUTANEOUS
  Administered 2023-03-24: 5 [IU] via SUBCUTANEOUS
  Administered 2023-03-24 – 2023-03-25 (×2): 2 [IU] via SUBCUTANEOUS
  Administered 2023-03-25: 3 [IU] via SUBCUTANEOUS
  Administered 2023-03-25: 2 [IU] via SUBCUTANEOUS
  Administered 2023-03-26: 3 [IU] via SUBCUTANEOUS
  Administered 2023-03-26: 2 [IU] via SUBCUTANEOUS
  Administered 2023-03-26: 3 [IU] via SUBCUTANEOUS
  Administered 2023-03-26 (×2): 2 [IU] via SUBCUTANEOUS
  Administered 2023-03-27: 3 [IU] via SUBCUTANEOUS
  Administered 2023-03-27: 5 [IU] via SUBCUTANEOUS
  Administered 2023-03-27 – 2023-03-28 (×5): 3 [IU] via SUBCUTANEOUS
  Administered 2023-03-28: 2 [IU] via SUBCUTANEOUS
  Administered 2023-03-28 (×2): 3 [IU] via SUBCUTANEOUS
  Administered 2023-03-28: 2 [IU] via SUBCUTANEOUS
  Administered 2023-03-28 – 2023-03-29 (×2): 3 [IU] via SUBCUTANEOUS
  Administered 2023-03-29: 2 [IU] via SUBCUTANEOUS
  Administered 2023-03-29: 5 [IU] via SUBCUTANEOUS
  Administered 2023-03-29: 3 [IU] via SUBCUTANEOUS
  Administered 2023-03-29 – 2023-04-01 (×7): 2 [IU] via SUBCUTANEOUS

## 2023-03-19 NOTE — Progress Notes (Signed)
LTM maint complete - skin breakdown under: FP2, T8. Mild irritation. Pt has very dry scaly skin and some breakdown is to be expected.

## 2023-03-19 NOTE — Progress Notes (Signed)
NEUROLOGY CONSULT FOLLOW UP NOTE   Date of service: March 19, 2023 Patient Name: Audrey Turner MRN:  [id number] DOB:  [birth year]  Brief HPI  62 yo F with a hx of astrocytoma in 2007 and subsequent sz disorder who presented on 12/07 with encephalopathy after a subacute decline in ability to swallow over the week prior. Was loaded with keppra.  Exam is been slowly improving with negative LTM EEG.   Interval Hx/subjective   Significantly improved since yesterday  Vitals   Vitals:   03/19/23 1130 03/19/23 1200 03/19/23 1206 03/19/23 1230  BP:      Pulse: 71 81 75 80  Resp: 10 (!) 27 16 16   Temp: (!) 97.2 F (36.2 C) (!) 97.2 F (36.2 C)  97.7 F (36.5 C)  TempSrc:      SpO2: 100% 100%  100%       Physical Exam   General: Laying in bed; intubated    Neurologic Examination  Mental status/Cognition: Eyes are open, she follows commands to squeeze her left hand and wiggle her toes on the left, does not follow in the right. Cranial nerves:   CN II Pupils equal and reactive to light   CN III,IV,VI She tracks across midline in both directions   CN V Corneals absent   CN VII Face grossly symmetric   CN VIII Follows command   CN IX & X Cough intact to suction    CN XI Head midline   CN XII Unable to assess secondary to ETT    Sensory/Motor:  She follows commands on the left, withdraws on the right.(She has a right hemiparesis at baseline)   Coordination/Complex Motor:  Unable to assess.  Medications  Current Facility-Administered Medications:    amantadine (SYMMETREL) 50 MG/5ML solution 100 mg, 100 mg, Per Tube, BID, Mannam, Praveen, MD, 100 mg at 03/19/23 0957   ARIPiprazole (ABILIFY) tablet 2.5 mg, 2.5 mg, Per Tube, Daily, Mannam, Praveen, MD, 2.5 mg at 03/19/23 0959   azithromycin (ZITHROMAX) 500 mg in sodium chloride 0.9 % 250 mL IVPB, 500 mg, Intravenous, Q24H, Yetta Barre, Sarah, DO, Paused at 03/19/23 0030   [START ON 03/20/2023] cefTRIAXone (ROCEPHIN) 2 g  in sodium chloride 0.9 % 100 mL IVPB, 2 g, Intravenous, Q24H, Jones, Sarah, DO   Chlorhexidine Gluconate Cloth 2 % PADS 6 each, 6 each, Topical, Daily, Assaker, Jean-Pierre, MD, 6 each at 03/19/23 1000   docusate sodium (COLACE) capsule 100 mg, 100 mg, Oral, BID PRN, Simonne Martinet, NP   famotidine (PEPCID) tablet 20 mg, 20 mg, Per Tube, BID, Mannam, Praveen, MD, 20 mg at 03/19/23 0956   feeding supplement (OSMOLITE 1.2 CAL) liquid 1,000 mL, 1,000 mL, Per Tube, Continuous, Mannam, Praveen, MD, Last Rate: 40 mL/hr at 03/19/23 1245, Infusion Verify at 03/19/23 1245   fentaNYL (SUBLIMAZE) injection 50 mcg, 50 mcg, Intravenous, Q15 min PRN, Simonne Martinet, NP, 50 mcg at 03/19/23 0013   fentaNYL (SUBLIMAZE) injection 50-200 mcg, 50-200 mcg, Intravenous, Q30 min PRN, Simonne Martinet, NP   free water 200 mL, 200 mL, Per Tube, Q4H, Zenia Resides E, NP, 200 mL at 03/19/23 1204   heparin injection 5,000 Units, 5,000 Units, Subcutaneous, Q8H, Simonne Martinet, NP, 5,000 Units at 03/19/23 1345   levETIRAcetam (KEPPRA) 100 MG/ML solution 1,000 mg, 1,000 mg, Per Tube, BID, Mannam, Praveen, MD, 1,000 mg at 03/19/23 1342   nutrition supplement (JUVEN) (JUVEN) powder packet 1 packet, 1 packet, Per Tube, BID BM, Mannam, Praveen, MD, 1  packet at 03/19/23 1345   Oral care mouth rinse, 15 mL, Mouth Rinse, Q2H, Simonne Martinet, NP, 15 mL at 03/19/23 1345   Oral care mouth rinse, 15 mL, Mouth Rinse, PRN, Simonne Martinet, NP   polyethylene glycol (MIRALAX / GLYCOLAX) packet 17 g, 17 g, Oral, Daily PRN, Simonne Martinet, NP   sodium phosphate 10 mmol in dextrose 5 % 250 mL infusion, 10 mmol, Intravenous, Once, Erick Alley, DO, Last Rate: 42 mL/hr at 03/19/23 1245, Infusion Verify at 03/19/23 1245   thiamine (VITAMIN B1) injection 100 mg, 100 mg, Intravenous, Daily, Rejeana Brock, MD, 100 mg at 03/19/23 1007   venlafaxine [facility]) tablet 37.5 mg, 37.5 mg, Per Tube, BID, Mannam, Praveen, MD, 37.5 mg at  03/19/23 0958 Labs and Diagnostic Imaging    Basic Metabolic Panel: Recent Labs  Lab 03/16/23 1955 03/16/23 2000 03/17/23 0047 03/17/23 0251 03/17/23 0627 03/17/23 0907 03/17/23 1945 03/18/23 0251 03/19/23 0322  NA 159* 158*   < > 155* 157* 154*  --  146* 143  K 4.7 5.4*   < > 2.4* 3.0* 3.6  --  3.0* 4.3  CL 115* 118*  --  123*  --  124*  --  117* 115*  CO2 19*  --   --  16*  --  19*  --  19* 22  GLUCOSE 134* 132*  --  173*  --  219*  --  223* 144*  BUN 26* 43*  --  22*  --  21*  --  18 26*  CREATININE 1.53* 1.10*  --  0.90  --  0.82  --  0.66 0.60  CALCIUM 9.2  --   --  7.1*  --  7.3*  --  6.7* 7.8*  MG  --   --   --  2.0  --   --   --  1.7 2.3  PHOS  --   --   --  1.6*  --   --  3.6 2.9 2.2*   < > = values in this interval not displayed.    CBC: Recent Labs  Lab 03/16/23 1955 03/16/23 2000 03/17/23 0047 03/17/23 0251 03/17/23 0627 03/18/23 0251 03/19/23 0322  WBC 13.7*  --   --  10.6*  --  14.4* 16.9*  NEUTROABS 11.6*  --   --  8.3*  --   --   --   HGB 15.1*   < > 10.2* 9.9* 9.5* 8.8* 9.5*  HCT 46.6*   < > 30.0* 30.7* 28.0* 26.7* 28.3*  MCV 81.3  --   --  81.4  --  79.0* 76.9*  PLT 312  --   --  208  --  204 197   < > = values in this interval not displayed.    Coagulation Studies: Recent Labs    03/16/23 1955  LABPROT 19.6*  INR 1.6*      Lipid Panel:  Lab Results  Component Value Date   LDLCALC 94 05/30/2021   HgbA1c:  Lab Results  Component Value Date   HGBA1C 4.9 05/30/2021   Urine Drug Screen:     Component Value Date/Time   LABOPIA NONE DETECTED 03/16/2023 2102   COCAINSCRNUR NONE DETECTED 03/16/2023 2102   LABBENZ POSITIVE (A) 03/16/2023 2102   AMPHETMU NONE DETECTED 03/16/2023 2102   THCU NONE DETECTED 03/16/2023 2102   LABBARB NONE DETECTED 03/16/2023 2102    Alcohol Level     Component Value Date/Time   ETH <10  03/16/2023 1955   TSH nml B12 812 B1 pending Rpr pending Hiv pending  Assessment   62 year old female who  presented with breakthrough seizures in the setting of likely missed Keppra dosing due to difficulty swallowing.  She has been encephalopathic, attributed to her multiple metabolic abnormalities.  LP is reassuring that CNS infection is not playing a role in her current presentation.  She is continuing to improve, no seizures on EEG.  At this point, I think her mental status on presentation was more due to her multiple electrolyte abnormalities than postictal state.  She was well-controlled on her previous dose of Keppra, just missed a few doses.  She continues to improve from a mental status standpoint and therefore I would just continue her home regimen.  Recommendations  -Can discontinue overnight LTM EEG - Keppra 1 g twice daily -Neurology will be available as needed, please call if further questions or concerns.  ______________________________________________________________________  Ritta Slot, MD [facility] [phone]  If 7pm- 7am, please page neurology on call as listed in AMION.

## 2023-03-19 NOTE — Progress Notes (Signed)
LTM EEG discontinued - no skin breakdown at unhook.   

## 2023-03-19 NOTE — Progress Notes (Signed)
RT NOTE: patient placed on CPAP/PSV of 10/5 at 0723.  Tolerating well at this time.  Will continue to monitor and wean as tolerated.

## 2023-03-19 NOTE — Procedures (Addendum)
Patient Name: Audrey Turner  MRN: [id number]  Epilepsy Attending: Charlsie Quest  Referring Physician/Provider: Erick Blinks, MD  Duration: 03/19/2023 0230 to 03/19/2023  1522   Patient history: 62yo M with breakthrough seizure getting eeg to evaluate for seizure   Level of alertness: lethargic   AEDs during EEG study: LEV   Technical aspects: This EEG study was done with scalp electrodes positioned according to the 10-20 International system of electrode placement. Electrical activity was reviewed with band pass filter of 1-70Hz , sensitivity of 7 uV/mm, display speed of 35mm/sec with a 60Hz  notched filter applied as appropriate. EEG data were recorded continuously and digitally stored.  Video monitoring was available and reviewed as appropriate.   Description: EEG showed continuous generalized and lateralized left hemisphere 3-6Hz  theta-delta slowing, at times with triphasic morphology and periodic at 1-2Hz . EEG also showed breach artifact in vertex region with 3-5Hz  sharply contoured theta-delta slowing admixed with 12-14Hz  beta activity. Polyspikes were noted in left frontal, vertex region. Hyperventilation and photic stimulation were not performed.      ABNORMALITY - Polyspikes, left frontal and vertex region. - Continuous slow, generalized and lateralized left hemisphere - Breach artifact, vertex region   IMPRESSION: This study showed evidence of epileptogenicity arising from  left frontal and vertex region. There was cortical dysfunction arising from left hemisphere likely secondary to underlying structural abnormality. Additionally there was cortical dysfunction in vertex region consistent with underlying craniotomy. Lastly there was moderate to severe encephalopathy. No seizures were seen throughout the recording.   [name] [name]

## 2023-03-19 NOTE — Progress Notes (Addendum)
NAME:  Audrey Turner, MRN:  [id number], DOB:  Dec 10, 1960, LOS: 3 ADMISSION DATE:  03/16/2023, CONSULTATION DATE:  12/7 REFERRING MD:  remy, CHIEF COMPLAINT:  acute encephalopathy    History of Present Illness:  62 year old female who is w/c bound at baseline w/ RUE contracted and RLE weakness but oriented x3 after neurosurgery in 2007.  Lives w/ and cared for by brother [name] 5-7d ago was having some trouble swallowing.  Following that over the following days was having runny nose, sore throat and congestions. Her speech quality worsened her brother though laryngitis. Over last 2d PTA decreased PO intake, having more trouble swallowing, not able to take in all her Keppra over last 24 hrs PTA. On 12/7 was at home w/ brother. He thought he witnessed a seizure and called EMS. She received versed on way to ER.  On arrival to ER pt was unresponsive w/ left gaze pref.  Initial ER eval  CT and MRI brain negative for acute changes. Did show remote craniectomy w/ex vacuo dilatation of left lateral ventricle She was loaded w/ Keppra Initial labs: cbg 124, INR 1.6, Na 159, CO2 19, BUN 26, cr 1.53, Trop 145. Lactate 4.1 Neg COVID Blood cultures sent Antibiotics started.  On CCM arrival still not responsive. Rigid w/ possible nuchal rigidity. Left gaze pref. 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  History of seizures on Keppra 100 mg p.o. twice daily. Depression At baseline in [facility] since surgery  Full fxn on left. Able to stand and pivot but weak right leg. Contracted right arm. She is A&O at baseline but needs assist w/ ADLs including eating Significant Hospital Events: Including procedures, antibiotic start and stop dates in addition to other pertinent events   12/7 admitted w/  sepsis and acute metabolic enceph w/ break thru seizures, PNA and possible meningitis started on vanc, ceftriaxone and azith also decadron  12/8 LP - non concerning meningitis, d/c decadron and vanc    Interim History / Subjective:  Per RN, still requiring very low dose precedex for agitation on vent. No blood noted in stool.   Objective   Blood pressure 109/68, pulse (!) 59, temperature (!) 97.2 F (36.2 C), resp. rate 16, SpO2 100%.    Vent Mode: PSV;CPAP FiO2 (%):  [40 %] 40 % Set Rate:  [12 bmp] 12 bmp Vt Set:  [350 mL-360 mL] 360 mL PEEP:  [5 cmH20] 5 cmH20 Pressure Support:  [10 cmH20] 10 cmH20 Plateau Pressure:  [12 cmH20-14 cmH20] 14 cmH20   Intake/Output Summary (Last 24 hours) at 03/19/2023 0729 Last data filed at 03/19/2023 9528 Gross per 24 hour  Intake 2745.79 ml  Output 450 ml  Net 2295.79 ml   There were no vitals filed for this visit.  Examination: Gen: somnolent HEENT: White sclera, ETT Lungs:   CTAB anteriorly, tolerating vent  CV:  RRR Abd: bowel sounds present, soft, non distended, non tender Ext: 1+ edema bilateral feet Neuro: opens eyes to loud voice, no response to pain, unable to follow commands   Resolved Hospital Problem list   AKI  Assessment & Plan:  Acute metabolic encephalopathy multifactorial due to sepsis, status epilepticus  Status epilepticus prior h/o astrocytoma s/p resxn 2007 LP 12/8 - non concerning for meningitis. B12, RPR, TSH WNL.  LTM  EEG - no seizures but with abnormalities  Repeat head CT 12/9 - stable  Plan Neuro following Cont keppra Continue LTM F/u vit B1, HIV Seizure precautions Precedex d/c'd CTM for improvement   Acute respiratory failure secondary to pneumonia, aspiration Intubated for airway protection.  Strep pneumo urine neg, RVP neg Plan Continue full vent support - still needs for AMS. Precedex d/c'd. Fentanyl prn for agitation  F/u Respiratory culture F/u legionella antigen  Continue ceftriaxone  and azithromycin   Sepsis w/ lactic acidosis 2/2 PNA  WBC mildly up trending, procal down trending. Blood Cx grew staph only 1/4 - likely contaminant. No longer requiring levophed for pressure support.  Plan F/u repeat blood cx 12/9 Monitor WBC  electrolyte derangements Continue free water flushes, starting tube feeds, RD consulted Watch for re-feeding once tube feeds start phos repleted Daily labs - twice daily once tube feeds start   AKI secondary to sepsis  Resolved CTM BMP  Anemia Hgb continues to downtrend, 15.6 on admission to 8.8 today.  No obvious source of bleeding.  CTM with daily CBC Transfusion threshold <8  Heart failure Trops 87>185 12/8. Echo 12/8 - LVEF 45-50%, G2DD Plan Cont tele Cards consulted - likely insetting of sepsis. No further workup at this time Repeat echo when acute illness resolves   Depression Continue home alt for home pristiq, abilify and amantadine   Best Practice (right click and "Reselect all SmartList Selections" daily)   Diet/type: NPO, OG tube for feeds and meds DVT prophylaxis SCD Pressure ulcer(s): N/A GI prophylaxis: H2B Lines: CVC Foley:  Yes, and it is still needed Code Status:  full code Last date of multidisciplinary goals of care discussion - 12/9 with brother. If brother is not at bedside today, will call him with updates.  Erick [facility], PGY-3

## 2023-03-20 LAB — CSF CULTURE W GRAM STAIN: Gram Stain: NONE SEEN

## 2023-03-20 LAB — HEMOGLOBIN A1C
Hgb A1c MFr Bld: 4.7 % — ABNORMAL LOW (ref 4.8–5.6)
Mean Plasma Glucose: 88 mg/dL

## 2023-03-20 LAB — BASIC METABOLIC PANEL
Anion gap: 4 — ABNORMAL LOW (ref 5–15)
Anion gap: 13 (ref 5–15)
BUN: 29 mg/dL — ABNORMAL HIGH (ref 6–20)
BUN: 23 mg/dL — ABNORMAL HIGH (ref 6–20)
CO2: 22 mmol/L (ref 22–32)
CO2: 25 mmol/L (ref 22–32)
Calcium: 7.7 mg/dL — ABNORMAL LOW (ref 8.9–10.3)
Calcium: 8.1 mg/dL — ABNORMAL LOW (ref 8.9–10.3)
Chloride: 116 mmol/L — ABNORMAL HIGH (ref 98–111)
Chloride: 109 mmol/L (ref 98–111)
Creatinine, Ser: 0.49 mg/dL (ref 0.44–1.00)
Creatinine, Ser: 0.5 mg/dL (ref 0.44–1.00)
GFR, Estimated: >60 mL/min (ref >60)
GFR, Estimated: >60 mL/min (ref >60)
Glucose, Bld: 143 mg/dL — ABNORMAL HIGH (ref 70–99)
Glucose, Bld: 122 mg/dL — ABNORMAL HIGH (ref 70–99)
Potassium: 3.4 mmol/L — ABNORMAL LOW (ref 3.5–5.1)
Potassium: 4.5 mmol/L (ref 3.5–5.1)
Sodium: 142 mmol/L (ref 135–145)
Sodium: 147 mmol/L — ABNORMAL HIGH (ref 135–145)

## 2023-03-20 LAB — CBC
HCT: 29.2 % — ABNORMAL LOW (ref 36.0–46.0)
Hemoglobin: 10 g/dL — ABNORMAL LOW (ref 12.0–15.0)
MCH: 26.2 pg (ref 26.0–34.0)
MCHC: 34.2 g/dL (ref 30.0–36.0)
MCV: 76.4 fL — ABNORMAL LOW (ref 80.0–100.0)
Platelets: 196 10*3/uL (ref 150–400)
RBC: 3.82 MIL/uL — ABNORMAL LOW (ref 3.87–5.11)
RDW: 13.9 % (ref 11.5–15.5)
WBC: 19.1 10*3/uL — ABNORMAL HIGH (ref 4.0–10.5)
nRBC: 0.4 % — ABNORMAL HIGH (ref 0.0–0.2)

## 2023-03-20 LAB — CULTURE, RESPIRATORY W GRAM STAIN

## 2023-03-20 LAB — GLUCOSE, CAPILLARY
Glucose-Capillary: 101 mg/dL — ABNORMAL HIGH (ref 70–99)
Glucose-Capillary: 104 mg/dL — ABNORMAL HIGH (ref 70–99)
Glucose-Capillary: 128 mg/dL — ABNORMAL HIGH (ref 70–99)
Glucose-Capillary: 129 mg/dL — ABNORMAL HIGH (ref 70–99)
Glucose-Capillary: 172 mg/dL — ABNORMAL HIGH (ref 70–99)
Glucose-Capillary: 200 mg/dL — ABNORMAL HIGH (ref 70–99)
Glucose-Capillary: 229 mg/dL — ABNORMAL HIGH (ref 70–99)

## 2023-03-20 LAB — PHOSPHORUS
Phosphorus: 1.7 mg/dL — ABNORMAL LOW (ref 2.5–4.6)
Phosphorus: 4.1 mg/dL (ref 2.5–4.6)

## 2023-03-20 LAB — MAGNESIUM
Magnesium: 2.2 mg/dL (ref 1.7–2.4)
Magnesium: 2.1 mg/dL (ref 1.7–2.4)

## 2023-03-20 LAB — LEGIONELLA PNEUMOPHILA SEROGP 1 UR AG: L. pneumophila Serogp 1 Ur Ag: NEGATIVE

## 2023-03-20 MED ORDER — FAMOTIDINE 20 MG PO TABS
20.0000 mg | ORAL_TABLET | Freq: Every day | ORAL | Status: DC
  Administered 2023-03-20 – 2023-04-07 (×19): 20 mg
  Filled 2023-03-20 (×19): qty 1

## 2023-03-20 MED ORDER — CEFAZOLIN SODIUM-DEXTROSE 2-4 GM/100ML-% IV SOLN
2.0000 g | Freq: Three times a day (TID) | INTRAVENOUS | Status: AC
  Administered 2023-03-21 – 2023-03-23 (×9): 2 g via INTRAVENOUS
  Filled 2023-03-20 (×9): qty 100

## 2023-03-20 MED ORDER — LINEZOLID 600 MG/300ML IV SOLN
600.0000 mg | Freq: Two times a day (BID) | INTRAVENOUS | Status: DC
  Administered 2023-03-20: 600 mg via INTRAVENOUS
  Filled 2023-03-20: qty 300

## 2023-03-20 MED ORDER — MEDIHONEY WOUND/BURN DRESSING EX PSTE
1.0000 | PASTE | Freq: Every day | CUTANEOUS | Status: DC
  Administered 2023-03-20 – 2023-04-10 (×22): 1 via TOPICAL
  Filled 2023-03-20 (×3): qty 44

## 2023-03-20 MED ORDER — GERHARDT'S BUTT CREAM
TOPICAL_CREAM | Freq: Three times a day (TID) | CUTANEOUS | Status: DC
  Administered 2023-03-20 – 2023-03-31 (×10): 1 via TOPICAL
  Filled 2023-03-20 (×3): qty 60

## 2023-03-20 MED ORDER — POTASSIUM PHOSPHATES 15 MMOLE/5ML IV SOLN
30.0000 mmol | Freq: Once | INTRAVENOUS | Status: AC
  Administered 2023-03-20: 30 mmol via INTRAVENOUS
  Filled 2023-03-20: qty 10

## 2023-03-20 NOTE — Plan of Care (Signed)
Risk for skin impairment remains high due to continued diarrhea

## 2023-03-20 NOTE — Consult Note (Addendum)
WOC Nurse Consult Note: Reason for Consult: Consult requested for right buttock.  Performed remotely after review of progress notes and photos in the EMR. Pt is critically ill with multiple systemic factors which can impair healing.  They are on a low airloss mattress to decrease pressure.  Pt was noted to have a Stage 1 pressure injury to the left heel with intact skin.  These can be treated by floating the heels to reduce pressure.  Wound type: Right inner buttock with Unstageable pressure injury, black-dark red,  Stage 3 pressure injury red and moist located in close proximity and separated by a narrow bridge of intact skin.  Pressure Injury POA: Yes Dressing procedure/placement/frequency: Topical treatment orders provided for bedside nurses to perform as follows to assist with removal of nonviable tissue: Apply Medihoney to right inner buttock wound Q day, then cover with foam dressing.  Change foam dressing Q 3 days or PRN soiling. (Also cover other right buttock wound with foam dressing.)   Please re-consult if further assistance is needed.  Thank-you,  Cammie Mcgee MSN, RN, CWOCN, [place], CNS [phone]

## 2023-03-20 NOTE — TOC CM/SW Note (Addendum)
Transition of Care [facility]) - Inpatient Brief Assessment   Patient Details  Name: Audrey Turner MRN: [id number] Date of Birth: June 26, 1960  Transition of Care [facility]) CM/SW Contact:    Harriet Masson, RN Phone Number: 03/20/2023, 2:35 PM   Clinical Narrative:  62 year old woman who presented with altered mental status and was intubated for suspected. Patient wheelchair bound and lives with brother.   Transition of Care Asessment:   NCM unable to assess patient due to intubation at this time. Patient not medically stable for discharge.  NCM will continue to follow as patient progresses with care towards discharge.

## 2023-03-20 NOTE — Progress Notes (Signed)
[facility] ADULT ICU REPLACEMENT PROTOCOL   The patient does apply for the [facility] Adult ICU Electrolyte Replacment Protocol based on the criteria listed below:   1.Exclusion criteria: TCTS, ECMO, Dialysis, and Myasthenia Gravis patients 2. Is GFR >/= 30 ml/min? Yes.    Patient's GFR today is >60 3. Is SCr </= 2? Yes.   Patient's SCr is 0.49 mg/dL 4. Did SCr increase >/= 0.5 in 24 hours? No. 5.Pt's weight >40kg  Yes.   6. Abnormal electrolyte(s):   K 3.4, Phos 1.7  7. Electrolytes replaced per protocol 8.  Call MD STAT for K+ </= 2.5, Phos </= 1, or Mag </= 1 Physician:  Shawn Stall R Shaunice Levitan 03/20/2023 5:12 AM

## 2023-03-20 NOTE — Progress Notes (Addendum)
NAME:  Audrey Turner, MRN:  [id number], DOB:  [birth year], LOS: 4 ADMISSION DATE:  03/16/2023, CONSULTATION DATE:  12/7 REFERRING MD:  remy, CHIEF COMPLAINT:  acute encephalopathy    History of Present Illness:  62 year old female who is w/c bound at baseline w/ RUE contracted and RLE weakness but oriented x3 after neurosurgery in 2007.  Lives w/ and cared for by brother [name] 5-7d ago was having some trouble swallowing.  Following that over the following days was having runny nose, sore throat and congestions. Her speech quality worsened her brother though laryngitis. Over last 2d PTA decreased PO intake, having more trouble swallowing, not able to take in all her Keppra over last 24 hrs PTA. On 12/7 was at home w/ brother. He thought he witnessed a seizure and called EMS. She received versed on way to ER.  On arrival to ER pt was unresponsive w/ left gaze pref.  Initial ER eval  CT and MRI brain negative for acute changes. Did show remote craniectomy w/ex vacuo dilatation of left lateral ventricle She was loaded w/ Keppra Initial labs: cbg 124, INR 1.6, Na 159, CO2 19, BUN 26, cr 1.53, Trop 145. Lactate 4.1 Neg COVID Blood cultures sent Antibiotics started.  On CCM arrival still not responsive. Rigid w/ possible nuchal rigidity. Left gaze pref. 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  History of seizures on Keppra 100 mg p.o. twice daily. Depression At baseline in [facility] since surgery  Full fxn on left. Able to stand and pivot but weak right leg. Contracted right arm. She is A&O at baseline but needs assist w/ ADLs including eating Significant Hospital Events: Including procedures, antibiotic start and stop dates in addition to other pertinent events   12/7 admitted w/  sepsis and acute metabolic enceph w/ break thru seizures, PNA and possible meningitis started on vanc, ceftriaxone and azith also decadron  12/8 LP - non concerning meningitis, d/c decadron and vanc    Interim History / Subjective:  Still not responsive   Objective   Blood pressure (!) 143/84, pulse 97, temperature 98.6 F (37 C), temperature source Axillary, resp. rate 18, weight 49 kg, SpO2 100%.    Vent Mode: PRVC FiO2 (%):  [30 %-40 %] 30 % Set Rate:  [12 bmp] 12 bmp Vt Set:  [350 mL-360 mL] 350 mL PEEP:  [5 cmH20] 5 cmH20 Pressure Support:  [10 cmH20] 10 cmH20 Plateau Pressure:  [12 cmH20-14 cmH20] 12 cmH20   Intake/Output Summary (Last 24 hours) at 03/20/2023 4098 Last data filed at 03/20/2023 0600 Gross per 24 hour  Intake 1044.85 ml  Output 994 ml  Net 50.85 ml   Filed Weights   03/20/23 0500  Weight: 49 kg    Examination: Gen: somnolent HEENT: White sclera, ETT Lungs:   CTAB anteriorly, tolerating vent  CV:  RRR Abd: bowel sounds present, soft, non distended, non tender Ext: 1+ edema bilateral feet Neuro: opens eyes to loud voice, able to very briefly follow my finger with eyes, responds to no other commands   Resolved Hospital Problem list   AKI  Assessment & Plan:  Acute metabolic encephalopathy multifactorial due to sepsis, status epilepticus  Status epilepticus prior h/o astrocytoma s/p resxn 2007 LP 12/8 - non concerning for meningitis. B12, RPR, HIV, TSH  WNL.  LTM EEG - no seizures but with abnormalities  Repeat head CT 12/9 - stable  Plan Neuro following Cont keppra F/u vit B1 Seizure precautions Will d/c home abilify and effexor (already received today) CTM for improvement - if no improvement tomorrow, can consider MRI to r/o reoccurrence of malignancy. Would discuss with brother first   Acute respiratory failure secondary to pneumonia, aspiration No longer septic  Intubated for airway protection in setting of AMS.  Strep pneumo urine neg,  RVP neg WBC mildly up trending. Blood Cx grew staph only 1/4 - likely contaminant. No longer requiring levophed for pressure support. Trach cx grew staph - unclear if MSSA or MRSA Plan Continue full vent support  Precedex d/c'd. Fentanyl prn for agitation  F/u Respiratory culture F/u legionella antigen  Continue ceftriaxone and azithromycin , add lenezolid today for MRSA coverage while awaiting ID panel Monitor WBC  electrolyte derangements Continue free water flushes, starting tube feeds, RD consulted Monitor re-feeding with BID labs phos and K repleted today   Anemia No obvious source of bleeding. Hgb stable CTM with daily CBC Transfusion threshold <8  Heart failure Trops 87>185 12/8. Echo 12/8 - LVEF 45-50%, G2DD Plan Cont tele Cards consulted - likely insetting of sepsis. No further workup at this time Repeat echo when acute illness resolves   Depression Continue home alt for home pristiq, abilify and amantadine   Best Practice (right click and "Reselect all SmartList Selections" daily)   Diet/type: NPO, OG tube for feeds and meds DVT prophylaxis SCD Pressure ulcer(s): N/A GI prophylaxis: H2B Lines: CVC Foley:  Yes, and it is still needed Code Status:  full code Last date of multidisciplinary goals of care discussion - 12/10 with brother at bedside, can call with updates if brother does not come today.    Erick Alley Family Medicine, PGY-3

## 2023-03-21 DIAGNOSIS — R569 Unspecified convulsions: Secondary | ICD-10-CM

## 2023-03-21 LAB — BASIC METABOLIC PANEL
Anion gap: 9 (ref 5–15)
BUN: 26 mg/dL — ABNORMAL HIGH (ref 6–20)
CO2: 27 mmol/L (ref 22–32)
Calcium: 7.9 mg/dL — ABNORMAL LOW (ref 8.9–10.3)
Chloride: 107 mmol/L (ref 98–111)
Creatinine, Ser: 0.45 mg/dL (ref 0.44–1.00)
GFR, Estimated: >60 mL/min (ref >60)
Glucose, Bld: 109 mg/dL — ABNORMAL HIGH (ref 70–99)
Potassium: 4.4 mmol/L (ref 3.5–5.1)
Sodium: 143 mmol/L (ref 135–145)

## 2023-03-21 LAB — GLUCOSE, CAPILLARY
Glucose-Capillary: 122 mg/dL — ABNORMAL HIGH (ref 70–99)
Glucose-Capillary: 118 mg/dL — ABNORMAL HIGH (ref 70–99)
Glucose-Capillary: 119 mg/dL — ABNORMAL HIGH (ref 70–99)
Glucose-Capillary: 148 mg/dL — ABNORMAL HIGH (ref 70–99)
Glucose-Capillary: 131 mg/dL — ABNORMAL HIGH (ref 70–99)
Glucose-Capillary: 95 mg/dL (ref 70–99)

## 2023-03-21 LAB — CBC
HCT: 26.6 % — ABNORMAL LOW (ref 36.0–46.0)
Hemoglobin: 9.3 g/dL — ABNORMAL LOW (ref 12.0–15.0)
MCH: 26.6 pg (ref 26.0–34.0)
MCHC: 35 g/dL (ref 30.0–36.0)
MCV: 76 fL — ABNORMAL LOW (ref 80.0–100.0)
Platelets: 172 10*3/uL (ref 150–400)
RBC: 3.5 MIL/uL — ABNORMAL LOW (ref 3.87–5.11)
RDW: 13.6 % (ref 11.5–15.5)
WBC: 15.1 10*3/uL — ABNORMAL HIGH (ref 4.0–10.5)
nRBC: 0.7 % — ABNORMAL HIGH (ref 0.0–0.2)

## 2023-03-21 LAB — RENAL FUNCTION PANEL
Albumin: 1.6 g/dL — ABNORMAL LOW (ref 3.5–5.0)
Anion gap: 7 (ref 5–15)
BUN: 25 mg/dL — ABNORMAL HIGH (ref 6–20)
CO2: 28 mmol/L (ref 22–32)
Calcium: 7.6 mg/dL — ABNORMAL LOW (ref 8.9–10.3)
Chloride: 106 mmol/L (ref 98–111)
Creatinine, Ser: 0.63 mg/dL (ref 0.44–1.00)
GFR, Estimated: >60 mL/min (ref >60)
Glucose, Bld: 188 mg/dL — ABNORMAL HIGH (ref 70–99)
Phosphorus: 2.8 mg/dL (ref 2.5–4.6)
Potassium: 3.9 mmol/L (ref 3.5–5.1)
Sodium: 141 mmol/L (ref 135–145)

## 2023-03-21 LAB — HEPATIC FUNCTION PANEL
ALT: 43 U/L (ref 0–44)
AST: 41 U/L (ref 15–41)
Albumin: 1.7 g/dL — ABNORMAL LOW (ref 3.5–5.0)
Alkaline Phosphatase: 69 U/L (ref 38–126)
Bilirubin, Direct: <0.1 mg/dL (ref 0.0–0.2)
Total Bilirubin: 0.4 mg/dL (ref <1.2)
Total Protein: 4.5 g/dL — ABNORMAL LOW (ref 6.5–8.1)

## 2023-03-21 LAB — PHOSPHORUS: Phosphorus: 2.2 mg/dL — ABNORMAL LOW (ref 2.5–4.6)

## 2023-03-21 LAB — CULTURE, BLOOD (ROUTINE X 2): Culture: NO GROWTH

## 2023-03-21 LAB — MAGNESIUM
Magnesium: 2 mg/dL (ref 1.7–2.4)
Magnesium: 2 mg/dL (ref 1.7–2.4)

## 2023-03-21 MED ORDER — BLISTEX MEDICATED EX OINT: TOPICAL_OINTMENT | CUTANEOUS | Status: DC | PRN

## 2023-03-21 MED ORDER — FUROSEMIDE 10 MG/ML IJ SOLN
20.0000 mg | Freq: Once | INTRAMUSCULAR | Status: AC
  Administered 2023-03-21: 20 mg via INTRAVENOUS
  Filled 2023-03-21: qty 2

## 2023-03-21 MED ORDER — THIAMINE MONONITRATE 100 MG PO TABS
100.0000 mg | ORAL_TABLET | Freq: Every day | ORAL | Status: DC
  Administered 2023-03-21 – 2023-04-07 (×18): 100 mg
  Filled 2023-03-21 (×18): qty 1

## 2023-03-21 MED ORDER — LABETALOL HCL 5 MG/ML IV SOLN
INTRAVENOUS | Status: AC
  Administered 2023-03-21: 10 mg via INTRAVENOUS
  Filled 2023-03-21: qty 4

## 2023-03-21 MED ORDER — HYDRALAZINE HCL 20 MG/ML IJ SOLN
INTRAMUSCULAR | Status: AC
  Filled 2023-03-21: qty 1

## 2023-03-21 MED ORDER — HYDRALAZINE HCL 20 MG/ML IJ SOLN: 10.0000 mg | Freq: Four times a day (QID) | INTRAMUSCULAR | Status: DC | PRN

## 2023-03-21 MED ORDER — SODIUM PHOSPHATES 45 MMOLE/15ML IV SOLN: 15.0000 mmol | Freq: Once | INTRAVENOUS | Status: DC

## 2023-03-21 MED ORDER — POTASSIUM & SODIUM PHOSPHATES 280-160-250 MG PO PACK
2.0000 | PACK | Freq: Two times a day (BID) | ORAL | Status: AC
  Administered 2023-03-21 – 2023-03-22 (×4): 2
  Filled 2023-03-21 (×4): qty 2

## 2023-03-21 MED ORDER — FUROSEMIDE 10 MG/ML IJ SOLN
40.0000 mg | Freq: Once | INTRAMUSCULAR | Status: AC
  Administered 2023-03-21: 40 mg via INTRAVENOUS
  Filled 2023-03-21: qty 4

## 2023-03-21 MED ORDER — LABETALOL HCL 5 MG/ML IV SOLN
10.0000 mg | INTRAVENOUS | Status: DC | PRN
  Administered 2023-03-21: 20 mg via INTRAVENOUS
  Administered 2023-03-22 – 2023-03-23 (×2): 10 mg via INTRAVENOUS
  Administered 2023-03-25: 20 mg via INTRAVENOUS
  Administered 2023-03-27 – 2023-03-29 (×4): 10 mg via INTRAVENOUS
  Administered 2023-04-07: 20 mg via INTRAVENOUS
  Filled 2023-03-21 (×8): qty 4

## 2023-03-21 NOTE — Progress Notes (Addendum)
NAME:  Audrey Turner, MRN:  [id number], DOB:  [birth year], LOS: 5 ADMISSION DATE:  03/16/2023, CONSULTATION DATE:  12/7 REFERRING MD:  remy, CHIEF COMPLAINT:  acute encephalopathy    History of Present Illness:  62 year old female who is w/c bound at baseline w/ RUE contracted and RLE weakness but oriented x3 after neurosurgery in 2007.  Lives w/ and cared for by brother Audrey Turner 5-7d ago was having some trouble swallowing.  Following that over the following days was having runny nose, sore throat and congestions. Her speech quality worsened her brother though laryngitis. Over last 2d PTA decreased PO intake, having more trouble swallowing, not able to take in all her Keppra over last 24 hrs PTA. On 12/7 was at home w/ brother. He thought he witnessed a seizure and called EMS. She received versed on way to ER.  On arrival to ER pt was unresponsive w/ left gaze pref.  Initial ER eval  CT and MRI brain negative for acute changes. Did show remote craniectomy w/ex vacuo dilatation of left lateral ventricle She was loaded w/ Keppra Initial labs: cbg 124, INR 1.6, Na 159, CO2 19, BUN 26, cr 1.53, Trop 145. Lactate 4.1 Neg COVID Blood cultures sent Antibiotics started.  On CCM arrival still not responsive. Rigid w/ possible nuchal rigidity. Left gaze pref. 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  History of seizures on Keppra 100 mg p.o. twice daily. Depression At baseline in [facility] since surgery  Full fxn on left. Able to stand and pivot but weak right leg. Contracted right arm. She is A&O at baseline but needs assist w/ ADLs including eating Significant Hospital Events: Including procedures, antibiotic start and stop dates in addition to other pertinent events   12/7 admitted w/  sepsis and acute metabolic enceph w/ break thru seizures, PNA and possible meningitis started on vanc, ceftriaxone and azith also decadron  12/8 LP - non concerning meningitis, d/c decadron and vanc    Interim History / Subjective:  Still not responsive   Objective   Blood pressure (!) 163/93, pulse 95, temperature 98.5 F (36.9 C), temperature source Oral, resp. rate (!) 27, weight 49 kg, SpO2 100%.    Vent Mode: PRVC FiO2 (%):  [40 %] 40 % Set Rate:  [12 bmp] 12 bmp Vt Set:  [350 mL] 350 mL PEEP:  [5 cmH20] 5 cmH20 Pressure Support:  [10 cmH20] 10 cmH20 Plateau Pressure:  [12 cmH20-16 cmH20] 16 cmH20   Intake/Output Summary (Last 24 hours) at 03/21/2023 0710 Last data filed at 03/21/2023 0500 Gross per 24 hour  Intake 1380.54 ml  Output 1428 ml  Net -47.46 ml   Filed Weights   03/20/23 0500  Weight: 49 kg    Examination: Gen: Awake but unresponsive HEENT: White sclera, ETT Lungs:   CTAB anteriorly, tolerating vent  CV:  RRR Abd:  soft, non distended, non tender Ext: edema bilateral feet and hands Neuro: opens eyes to loud voice, able to very briefly follow my finger with eyes, responds to no other commands   Resolved Hospital Problem list   AKI  Assessment & Plan:  Acute metabolic encephalopathy multifactorial due to sepsis, status epilepticus  Status epilepticus prior h/o astrocytoma s/p resxn 2007 LP 12/8 - non concerning for meningitis. B12, RPR, HIV, TSH  WNL.  LTM EEG - no seizures but with abnormalities  Of note, had brain MR on 03/16/23 - no acute abnormality  Repeat head CT 12/9 - stable  Not improving, still unable to follow any commands Plan Neuro s/o 12/10 - can follow as needed Cont keppra Seizure precautions Will d/c home abilify and effexor (already received today) Plan for GOC discussion with brother today, consider palliative consult   Acute respiratory failure secondary to pneumonia, aspiration No longer septic  Intubated for airway  protection in setting of AMS.  Strep pneumo urine neg, RVP neg WBC mildly up trending. Blood Cx grew staph only 1/4 - likely contaminant. No longer requiring levophed for pressure support. Trach cx grew MSSA Plan Continue full vent support - consider trail of extubation, will talk to brother Precedex d/c'd. Fentanyl prn for agitation  F/u Respiratory culture F/u legionella antigen  Narrow abx to ancef to total of 7 days  electrolyte derangements Continue free water flushes, starting tube feeds, RD consulted Monitor re-feeding with BID labs phos repleted today  Anemia No obvious source of bleeding. Hgb stable CTM with intermittent CBC Transfusion threshold <8  Heart failure Worsening edema and tachycardic today Trops 87>185 12/8. Echo 12/8 - LVEF 45-50%, G2DD Plan Cont tele One dose lasix IV today  Cards consulted - likely insetting of sepsis. No further workup at this time Repeat echo when acute illness resolves   Depression Continue home amantadine Holding home  pristiq, abilify   Best Practice (right click and "Reselect all SmartList Selections" daily)   Diet/type: NPO, OG tube for feeds and meds DVT prophylaxis SCD Pressure ulcer(s): present on admission - wound care saw, see note GI prophylaxis: H2B Lines: CVC Foley:  Yes, will without today Code Status:  full code Last date of multidisciplinary goals of care discussion - 12/10 with brother at bedside. Will call brother if does not come to bedside today    Erick Alley Family Medicine, PGY-3

## 2023-03-21 NOTE — Progress Notes (Signed)
Chaplain rec'd page requesting spiritual support for pt and family, including brother and two daughters.   Brother [name] reviewed pt's medical hx with chaplain. Brother explained that the current situation "doesn't look good" but that that could change. Family waits for breathing tube to be removed to see if pt can breathe on her own. There are concerns about pt's heart.  Daughters became tearful. Chaplain expressed sympathy for their experiences due to pt's health challenges over the past 17 years.   Chaplain prayed for the wisdom of the care team and for the family's comfort during this anxious time.    03/21/23 1740  Spiritual Encounters  Type of Visit Initial  Care provided to: Pt and family  Referral source Nurse (RN/NT/LPN)  Reason for visit Religious ritual  OnCall Visit Yes  Spiritual Framework  Presenting Themes Values and beliefs;Caregiving needs;Impactful experiences and emotions;Courage hope and growth;Rituals and practive;Community and relationships  Community/Connection Family  Patient Stress Factors Not reviewed  Family Stress Factors Loss  Interventions  Spiritual Care Interventions Made Established relationship of care and support;Compassionate presence;Reflective listening;Prayer  Intervention Outcomes  Outcomes Connection to spiritual care

## 2023-03-22 ENCOUNTER — Inpatient Hospital Stay (HOSPITAL_COMMUNITY): Payer: Medicare Other

## 2023-03-22 DIAGNOSIS — Z515 Encounter for palliative care: Secondary | ICD-10-CM

## 2023-03-22 DIAGNOSIS — Z7189 Other specified counseling: Secondary | ICD-10-CM

## 2023-03-22 LAB — RENAL FUNCTION PANEL
Albumin: 1.7 g/dL — ABNORMAL LOW (ref 3.5–5.0)
Anion gap: 10 (ref 5–15)
BUN: 25 mg/dL — ABNORMAL HIGH (ref 6–20)
CO2: 29 mmol/L (ref 22–32)
Calcium: 7.8 mg/dL — ABNORMAL LOW (ref 8.9–10.3)
Chloride: 101 mmol/L (ref 98–111)
Creatinine, Ser: 0.6 mg/dL (ref 0.44–1.00)
GFR, Estimated: >60 mL/min (ref >60)
Glucose, Bld: 148 mg/dL — ABNORMAL HIGH (ref 70–99)
Phosphorus: 3.8 mg/dL (ref 2.5–4.6)
Potassium: 3.7 mmol/L (ref 3.5–5.1)
Sodium: 140 mmol/L (ref 135–145)

## 2023-03-22 LAB — GLUCOSE, CAPILLARY
Glucose-Capillary: 158 mg/dL — ABNORMAL HIGH (ref 70–99)
Glucose-Capillary: 151 mg/dL — ABNORMAL HIGH (ref 70–99)
Glucose-Capillary: 100 mg/dL — ABNORMAL HIGH (ref 70–99)
Glucose-Capillary: 158 mg/dL — ABNORMAL HIGH (ref 70–99)
Glucose-Capillary: 107 mg/dL — ABNORMAL HIGH (ref 70–99)
Glucose-Capillary: 242 mg/dL — ABNORMAL HIGH (ref 70–99)

## 2023-03-22 LAB — MAGNESIUM: Magnesium: 1.9 mg/dL (ref 1.7–2.4)

## 2023-03-22 MED ORDER — BANATROL TF EN LIQD
60.0000 mL | Freq: Two times a day (BID) | ENTERAL | Status: DC
  Administered 2023-03-22 (×2): 60 mL
  Filled 2023-03-22 (×2): qty 60

## 2023-03-22 MED ORDER — FUROSEMIDE 10 MG/ML IJ SOLN
20.0000 mg | Freq: Once | INTRAMUSCULAR | Status: AC
  Administered 2023-03-22: 20 mg via INTRAVENOUS
  Filled 2023-03-22: qty 2

## 2023-03-22 MED ORDER — POTASSIUM CHLORIDE 20 MEQ PO PACK
40.0000 meq | PACK | Freq: Once | ORAL | Status: AC
  Administered 2023-03-22: 40 meq
  Filled 2023-03-22: qty 2

## 2023-03-22 MED ORDER — MAGNESIUM SULFATE 2 GM/50ML IV SOLN
2.0000 g | Freq: Once | INTRAVENOUS | Status: AC
  Administered 2023-03-22: 2 g via INTRAVENOUS
  Filled 2023-03-22: qty 50

## 2023-03-22 NOTE — Procedures (Signed)
Cortrak  Person Inserting Tube:  Mahala Menghini, RD Tube Type:  Cortrak - 43 inches Tube Size:  10 Tube Location:  Left nare Secured by: Bridle Technique Used to Measure Tube Placement:  Marking at nare/corner of mouth Cortrak Secured At:  74 cm   Cortrak Tube Team Note:  Consult received to place a Cortrak feeding tube.   X-ray is required, abdominal x-ray has been ordered by the Cortrak team. Please confirm tube placement before using the Cortrak tube.   If the tube becomes dislodged please keep the tube and contact the Cortrak team at [email or web address] for replacement.  If after hours and replacement cannot be delayed, place a NG tube and confirm placement with an abdominal x-ray.    Mertie Clause, MS, RD, LDN Registered Dietitian II Please see AMiON for contact information.

## 2023-03-22 NOTE — Progress Notes (Addendum)
NAME:  Audrey Turner, MRN:  [id number], DOB:  05/27/1960, LOS: 6 ADMISSION DATE:  03/16/2023, CONSULTATION DATE:  12/7 REFERRING MD:  remy, CHIEF COMPLAINT:  acute encephalopathy    History of Present Illness:  62 year old female who is w/c bound at baseline w/ RUE contracted and RLE weakness but oriented x3 after neurosurgery in 2007.  Lives w/ and cared for by brother [name] 5-7d ago was having some trouble swallowing.  Following that over the following days was having runny nose, sore throat and congestions. Her speech quality worsened her brother though laryngitis. Over last 2d PTA decreased PO intake, having more trouble swallowing, not able to take in all her Keppra over last 24 hrs PTA. On 12/7 was at home w/ brother. He thought he witnessed a seizure and called EMS. She received versed on way to ER.  On arrival to ER pt was unresponsive w/ left gaze pref.  Initial ER eval  CT and MRI brain negative for acute changes. Did show remote craniectomy w/ex vacuo dilatation of left lateral ventricle She was loaded w/ Keppra Initial labs: cbg 124, INR 1.6, Na 159, CO2 19, BUN 26, cr 1.53, Trop 145. Lactate 4.1 Neg COVID Blood cultures sent Antibiotics started.  On CCM arrival still not responsive. Rigid w/ possible nuchal rigidity. Left gaze pref. 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  History of seizures on Keppra 100 mg p.o. twice daily. Depression At baseline in [facility] since surgery  Full fxn on left. Able to stand and pivot but weak right leg. Contracted right arm. She is A&O at baseline but needs assist w/ ADLs including eating Significant Hospital Events: Including procedures, antibiotic start and stop dates in addition to other pertinent events   12/7 admitted w/  sepsis and acute metabolic enceph w/ break thru seizures, PNA and possible meningitis started on vanc, ceftriaxone and azith also decadron  12/8 LP - non concerning meningitis, d/c decadron and vanc  12/11 -trach cx - MSSA, switched to ancef   Interim History / Subjective:  More alert yesterday, able to follow some commands but not consistently   Objective   Blood pressure 124/76, pulse 86, temperature (!) 97.4 F (36.3 C), temperature source Oral, resp. rate 15, weight 50.8 kg, SpO2 100%.    Vent Mode: PRVC FiO2 (%):  [40 %] 40 % Set Rate:  [12 bmp] 12 bmp Vt Set:  [350 mL] 350 mL PEEP:  [5 cmH20] 5 cmH20 Pressure Support:  [10 cmH20] 10 cmH20 Plateau Pressure:  [18 cmH20-21 cmH20] 21 cmH20   Intake/Output Summary (Last 24 hours) at 03/22/2023 0714 Last data filed at 03/22/2023 0600 Gross per 24 hour  Intake 2000 ml  Output 3445 ml  Net -1445 ml   Filed Weights   03/20/23 0500 03/21/23 1410 03/22/23 0500  Weight: 49 kg 54 kg 50.8 kg    Examination: Gen: Sleeping HEENT: ETT Lungs: CTAB anteriorly, tolerating vent  CV:  RRR Abd:  soft, non distended, non tender Ext: edema bilateral feet and hands Neuro: sleeping, did not wake  Resolved Hospital Problem list   AKI  Assessment & Plan:  Acute metabolic encephalopathy multifactorial due to sepsis, status epilepticus  Status epilepticus prior h/o astrocytoma s/p resxn 2007 LP 12/8 - non concerning for meningitis. B12, RPR, HIV,  TSH WNL.  LTM EEG - no seizures but with abnormalities  Of note, had brain MR on 03/16/23 - no acute abnormality  Repeat head CT 12/9 - stable  Able to follow a few commands yesterday (wiggle toes, follow with eyes) but not consistently  Plan Neuro s/o 12/10 - can follow as needed Cont keppra Seizure precautions Will d/c home abilify and effexor (already received today) Palliative consulted yesterday. Need to discuss GOC for extubation plan - if does not tolerate would we re intubate vs  comfort care. Unlikely to do well if re intubated.   Acute respiratory failure secondary to pneumonia, aspiration No longer septic  Intubated for airway protection in setting of AMS.  Strep pneumo urine neg, RVP neg WBC mildly up trending. Blood Cx grew staph only 1/4 - likely contaminant. No longer requiring levophed for pressure support. Trach cx grew MSSA Plan Continue full vent support - consider trail of extubation in near future Fentanyl prn for agitation  ancef for total of 7 days  electrolyte derangements Malnutrition getting tube feeds Monitor re-feeding  Replete as needed Diarrhea in setting of tube feeds - banatrol added  Anemia No obvious source of bleeding. Hgb stable CTM with intermittent CBC Transfusion threshold <8  Heart failure Worsening edema and tachycardic today Trops 87>185 12/8. Echo 12/8 - LVEF 45-50%, G2DD Plan Cont tele Responded to lasix yesterday, will repeat today Cards consulted - likely insetting of sepsis. No further workup at this time Repeat echo when/if acute illness resolves   Depression Continue home amantadine Holding home  pristiq, abilify   Best Practice (right click and "Reselect all SmartList Selections" daily)   Diet/type: NPO, OG tube for feeds and meds DVT prophylaxis SCD Pressure ulcer(s): present on admission - wound care saw, see note GI prophylaxis: H2B Lines: CVC Foley:  Yes, needed Code Status:  full code Last date of multidisciplinary goals of care discussion - 12/13 with brother at bedside. Plan to discuss GOC further with palliative and other family members today    Erick Alley Family Medicine, PGY-3

## 2023-03-22 NOTE — Consult Note (Signed)
Palliative Care Consult Note                                  Date: 03/22/2023   Patient Name: Audrey Turner  DOB: 10/20/1960  MRN: [id number]  Age / Sex: 62 y.o., female  PCP: Joaquim Nam, MD Referring Physician: Lynnell Catalan, MD  Reason for Consultation: Establishing goals of care  HPI/Patient Profile: 62 y.o. female  with past medical history of astrocytoma s/p chemo, radiation, and multiple resections (2007), seizures, and depression admitted on 03/16/2023 with altered mental status.   Acute metabolic encephalopathy felt to be secondary to sepsis from pneumonia and status epilepticus. She was intubated for airway protection. She has now been off sedation for several days with limited improvement in mental status.  Past Medical History:  Diagnosis Date   Acute sinusitis, unspecified    Astrocytoma (HCC) 01/2006 /09/2006   grade 3 atrocytoma s/p chemo/radiation/surgery at [facility], in remission since at least 2011   Cancer [facility])    Brain    Diarrhea    Drug induced neutropenia(288.03)    Primary exertional headache    Recurrent depression (HCC)    Vaginitis     Subjective:   I have reviewed medical records including EPIC notes, labs and imaging, assessed the patient and then met at the patient's bedside with her brother [name] Oxendine who is her HCPOA to discuss diagnosis prognosis, GOC, EOL wishes, disposition and options.  I introduced Palliative Medicine as specialized medical care for people living with serious illness. It focuses on providing relief from symptoms and stress of a serious illness. The goal is to improve quality of life for both the patient and the family.  Today's Discussion: [name] has a good understanding of his sister's chronic conditions as he lives with her, is her HCPOA, and is a caregiver. [name] shares that the patient had brain cancer several years ago for which she had chemo, surgery, and  radiation. Initially she had a good recovery but over time has required more assistance with ADLs due to her short term memory loss, right upper extremity contracture, and right lower extremity weakness. He states that at baseline she is in a wheelchair but can stand and take a few steps with sufficient time and help. [name] reports that at baseline the patient is alert and oriented and able to have meaningful interactions and conversations. One of the patient's daughter's also lives in the house and helps with both the patient and the patient's mother. [name] shares that the patient was admitted with altered mental status, difficulty swallowing, and decreased oral intake. On the day of admission he witnessed a seizure and called EMS. The patient was intubated for airway protection.   The patient and her family face treatment option decisions, advanced directive decisions, and anticipatory care needs. [name] understands the patient is critically ill but also shares that he and the family are hopeful she will eventually recover to her former functional level.  [name] shares that although he is the [place] he wants other family to participate in GOC conversations. [name] shares that the patient had not completed a living will. He feels he has a good understanding of what interventions she may or may not want moving forward. We agree to meet again tomorrow 03/23/23 at noon when more family can be present to discuss diagnosis prognosis, GOC, EOL wishes, disposition and options.   Discussed the importance of  continued conversation with family and the medical providers regarding overall plan of care and treatment options, ensuring decisions are within the context of the patient's values and GOCs.  Questions and concerns were addressed. The family was encouraged to call with questions or concerns. PMT will continue to support holistically.  Review of Systems  Unable to perform ROS   Objective:   Primary  Diagnoses: Present on Admission:  Sepsis [facility])   Physical Exam Constitutional:      Appearance: She is ill-appearing.     Interventions: She is intubated.     Comments: NG tube  Cardiovascular:     Rate and Rhythm: Normal rate.  Pulmonary:     Effort: She is intubated.     Comments: On ventilator  Neurological:     Mental Status: She is lethargic.     Vital Signs:  BP 122/64 (BP Location: Left Wrist)   Pulse (!) 101   Temp 99.5 F (37.5 C) (Axillary)   Resp (!) 24   Wt 50.8 kg   SpO2 100%   BMI 21.87 kg/m   Palliative Assessment/Data: 30% with feeds    Advanced Care Planning:   Existing Vynca/ACP Documentation: None  Primary Decision Maker: Patient's brother [name] Oxendine.  Code Status/Advance Care Planning: Full code  Assessment & Plan:   SUMMARY OF RECOMMENDATIONS   Full code  Full scope Encouraged family to continue discussing goals of care prior to our scheduled family meeting 03/23/23 at 1200 Continued PMT support  Discussed with: bedside RN  Time Total: 60 minutes  Thank you for allowing [place] to participate in the care of Audrey Turner PMT will continue to support holistically.   Signed by: Sarina Ser, NP Palliative Medicine Team  Team Phone # [phone] (Nights/Weekends)  03/22/2023, 4:30 PM

## 2023-03-22 NOTE — Progress Notes (Signed)
Brief Nutrition Support Note  Pt remains intubated at this time with guarded prognosis. PMT working with family on GOC. Possible extubation over the weekend. MD requests cortrak be placed to avoid disruptions in medication administration and feeds.   Cortrak placed, imaging pending. Once confirmed to be gastric, recommend resuming the following: Osmolite 1.2 at 2ml/hr (1200 ml per day) TF at goal provides 1440 kcal, 67 gm protein, 984 ml free water daily   Greig Castilla, RD, LDN Registered Dietitian II Please reach out via secure chat Weekend on-call pager # available in [facility]

## 2023-03-23 LAB — RENAL FUNCTION PANEL
Albumin: 1.6 g/dL — ABNORMAL LOW (ref 3.5–5.0)
Anion gap: 7 (ref 5–15)
BUN: 31 mg/dL — ABNORMAL HIGH (ref 6–20)
CO2: 31 mmol/L (ref 22–32)
Calcium: 7.5 mg/dL — ABNORMAL LOW (ref 8.9–10.3)
Chloride: 100 mmol/L (ref 98–111)
Creatinine, Ser: 0.62 mg/dL (ref 0.44–1.00)
GFR, Estimated: >60 mL/min
Glucose, Bld: 143 mg/dL — ABNORMAL HIGH (ref 70–99)
Phosphorus: 3.5 mg/dL (ref 2.5–4.6)
Potassium: 4.6 mmol/L (ref 3.5–5.1)
Sodium: 138 mmol/L (ref 135–145)

## 2023-03-23 LAB — GLUCOSE, CAPILLARY
Glucose-Capillary: 150 mg/dL — ABNORMAL HIGH (ref 70–99)
Glucose-Capillary: 125 mg/dL — ABNORMAL HIGH (ref 70–99)
Glucose-Capillary: 108 mg/dL — ABNORMAL HIGH (ref 70–99)
Glucose-Capillary: 182 mg/dL — ABNORMAL HIGH (ref 70–99)
Glucose-Capillary: 82 mg/dL (ref 70–99)
Glucose-Capillary: 173 mg/dL — ABNORMAL HIGH (ref 70–99)

## 2023-03-23 LAB — CULTURE, BLOOD (ROUTINE X 2)
Culture: NO GROWTH
Culture: NO GROWTH

## 2023-03-23 LAB — MAGNESIUM: Magnesium: 2.4 mg/dL (ref 1.7–2.4)

## 2023-03-23 LAB — VITAMIN B1: Vitamin B1 (Thiamine): 103.8 nmol/L (ref 66.5–200.0)

## 2023-03-23 MED ORDER — FREE WATER
200.0000 mL | Freq: Four times a day (QID) | Status: DC
Start: 2023-03-23 — End: 2023-03-25
  Administered 2023-03-23 – 2023-03-25 (×8): 200 mL

## 2023-03-23 MED ORDER — FUROSEMIDE 10 MG/ML IJ SOLN
40.0000 mg | Freq: Once | INTRAMUSCULAR | Status: AC
  Administered 2023-03-23: 40 mg via INTRAVENOUS
  Filled 2023-03-23: qty 4

## 2023-03-23 MED ORDER — BANATROL TF EN LIQD
60.0000 mL | Freq: Three times a day (TID) | ENTERAL | Status: DC
  Administered 2023-03-23 – 2023-04-07 (×46): 60 mL
  Filled 2023-03-23 (×50): qty 60

## 2023-03-23 NOTE — Progress Notes (Signed)
Daily Progress Note   Patient Name: Audrey Turner       Date: 03/23/2023 DOB: Nov 21, 1960  Age: 62 y.o. MRN#: [id number] Attending Physician: Leslye Peer, MD Primary Care Physician: Joaquim Nam, MD Admit Date: 03/16/2023  Reason for Consultation/Follow-up: Establishing goals of care  Length of Stay: 7  Current Medications: Scheduled Meds:   amantadine  100 mg Per Tube BID   Chlorhexidine Gluconate Cloth  6 each Topical Daily   famotidine  20 mg Per Tube Daily   fiber supplement (BANATROL TF)  60 mL Per Tube Q8H   free water  200 mL Per Tube Q6H   Gerhardt's butt cream   Topical TID   heparin injection (subcutaneous)  5,000 Units Subcutaneous Q8H   insulin aspart  0-15 Units Subcutaneous Q4H   leptospermum manuka honey  1 Application Topical Daily   levETIRAcetam  1,000 mg Per Tube BID   nutrition supplement (JUVEN)  1 packet Per Tube BID BM   mouth rinse  15 mL Mouth Rinse Q2H   thiamine  100 mg Per Tube Daily    Continuous Infusions:   ceFAZolin (ANCEF) IV Stopped (03/23/23 0646)   feeding supplement (OSMOLITE 1.2 CAL) 50 mL/hr at 03/23/23 1200    PRN Meds: docusate sodium, fentaNYL (SUBLIMAZE) injection, hydrALAZINE, labetalol, lip balm, mouth rinse, polyethylene glycol  Physical Exam Vitals reviewed.  Constitutional:      Appearance: She is ill-appearing.     Interventions: She is intubated.     Comments: NG tube  Cardiovascular:     Rate and Rhythm: Tachycardia present.  Pulmonary:     Effort: She is intubated.  Neurological:     Mental Status: She is lethargic.             Vital Signs: BP (!) 133/96 (BP Location: Left Wrist)   Pulse (!) 123   Temp (!) 100.8 F (38.2 C) (Oral)   Resp (!) 31   Wt 50 kg   SpO2 100%   BMI 21.53 kg/m   SpO2: SpO2: 100 % O2 Device: O2 Device: Ventilator O2 Flow Rate: O2 Flow Rate (L/min): 4 L/min  Palliative Assessment/Data: 30% with feeds      Patient Active Problem List   Diagnosis Date Noted   Acute hypoxic respiratory failure (HCC) 03/17/2023   URI (  upper respiratory infection) 03/17/2023   Sepsis (HCC) 03/16/2023   Epistaxis 10/15/2021   Cold extremities 06/07/2021   Insomnia 01/22/2021   Healthcare maintenance 09/08/2019   Contracture of hand 09/08/2019   Urinary incontinence 06/12/2019   Medicare annual wellness visit, subsequent 06/11/2018   Lower extremity edema 06/11/2018   HLD (hyperlipidemia) 05/13/2017   Localization-related symptomatic epilepsy and epileptic syndromes with complex partial seizures, not intractable, without status epilepticus (HCC) 07/17/2016   Hx of astrocytoma 07/17/2016   Hypoxia, sleep related 07/03/2016   Convulsions/seizures (HCC) 12/14/2015   Advance care planning 12/13/2015   Muscular deconditioning 11/10/2015   Hyperglycemia 03/12/2013   Obstructive sleep apnea 12/22/2009   Depression 08/14/2007   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 female  with past medical history of astrocytoma s/p chemo, radiation, and multiple resections (2007), seizures, and depression admitted on 03/16/2023 with altered mental status.    Acute metabolic encephalopathy felt to be secondary to sepsis from pneumonia and status epilepticus. She was intubated for airway protection. She has now been off sedation for several days with limited improvement in mental status.   Patient faces treatment option decisions, advanced directive decisions and anticipatory care needs.  Today's Discussion: Patient in room in NAD. She weaned some yesterday. Remains on ventilator. Family requested we meet outside of patient room.   Family meeting with HCPOA/brother Audrey Turner, daughter Audrey Turner, brother  Audrey Turner, sister in law, and nephew. Family share a history of the patient both before and after her functional decline. They share they are a close and religious family.  The patient never discussed goals of care with her family. We discussed advanced directives. Recommended consideration of DNR status, understanding evidenced-based poor outcomes in similar hospitalized patients, as the cause of the arrest is likely associated with chronic/terminal disease rather than a reversible acute cardio-pulmonary event. Family would like to change code status to do not resuscitate.   We discussed the patient's being intubated to protect her airway. The family does agree that she would not want to be dependent on machines. Her daughter states the patient would not want the quality of life that would come with being on a ventilator long term. Brother Audrey Turner and sister in Social worker agree. The family is hopeful her mental status will continue improving giving her a better chance of tolerating extubation. We discussed giving her time for outcomes but understanding that a decision will have to be made regarding extubation/ reintubation. They are leaning toward no reintubation but HCPOA Audrey Turner has not made that final decision.  Encouraged the family to keep discussing goals of care emphasizing the importance of considering her quality of life, overall suffering, what would be acceptable to her in the future.  Encouraged family to call PMT with questions or concerns. Plan to meet again with PMT Tuesday 12/17 at noon.    Recommendations/Plan: Change to DNR Full scope Time for outcomes Encouraged family to continue discussing goals of care Continued PMT support-- family meeting Tuesday 03/26/2023 at noon   Code Status:    Code Status Orders  (From admission, onward)           Start     Ordered   03/23/23 1328  Do not attempt resuscitation (DNR) Pre-Arrest Interventions Desired  (Code Status)  Continuous       Question  Answer Comment  If pulseless and not breathing No CPR or chest compressions.   In Pre-Arrest Conditions (  Patient Has Pulse and Is Breathing) May intubate, use advanced airway interventions and cardioversion/ACLS medications if appropriate or indicated. May transfer to ICU.   Consent: Discussion documented in EHR or advanced directives reviewed      03/23/23 1327         Extensive chart review has been completed prior to seeing the patient and meeting with the family including labs, vital signs, imaging, progress/consult notes, orders, medications, and available advance directive documents.   Care plan was discussed with bedside RN and Dr. Delton Coombes  Time spent: 80 minutes  Thank you for allowing the Palliative Medicine Team to assist in the care of this patient.   Sherryll Burger, NP  Please contact Palliative Medicine Team phone at [phone] for questions and concerns.

## 2023-03-23 NOTE — Progress Notes (Signed)
NAME:  Audrey Turner, MRN:  [id number], DOB:  1961/01/25, LOS: 7 ADMISSION DATE:  03/16/2023, CONSULTATION DATE:  12/7 REFERRING MD:  remy, CHIEF COMPLAINT:  acute encephalopathy    History of Present Illness:  62 year old female who is w/c bound at baseline w/ RUE contracted and RLE weakness but oriented x3 after neurosurgery in 2007.  Lives w/ and cared for by brother Audrey Turner 5-7d ago was having some trouble swallowing.  Following that over the following days was having runny nose, sore throat and congestions. Her speech quality worsened her brother though laryngitis. Over last 2d PTA decreased PO intake, having more trouble swallowing, not able to take in all her Keppra over last 24 hrs PTA. On 12/7 was at home w/ brother. He thought he witnessed a seizure and called EMS. She received versed on way to ER.  On arrival to ER pt was unresponsive w/ left gaze pref.  Initial ER eval  CT and MRI brain negative for acute changes. Did show remote craniectomy w/ex vacuo dilatation of left lateral ventricle She was loaded w/ Keppra Initial labs: cbg 124, INR 1.6, Na 159, CO2 19, BUN 26, cr 1.53, Trop 145. Lactate 4.1 Neg COVID Blood cultures sent Antibiotics started.  On CCM arrival still not responsive. Rigid w/ possible nuchal rigidity. Left gaze pref. 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  History of seizures on Keppra 100 mg p.o. twice daily. Depression At baseline in [facility] since surgery  Full fxn on left. Able to stand and pivot but weak right leg. Contracted right arm. She is A&O at baseline but needs assist w/ ADLs including eating Significant Hospital Events: Including procedures, antibiotic start and stop dates in addition to other pertinent events   12/7 admitted w/  sepsis and acute metabolic enceph w/ break thru seizures, PNA and possible meningitis started on vanc, ceftriaxone and azith also decadron  12/8 LP - non concerning meningitis, d/c decadron and vanc  12/11 -trach cx - MSSA, switched to ancef   Interim History / Subjective:  0.40, PEEP 5 No sedating drips at this time I/O +6.4 L total Seen in consultation by palliative care medicine on 12/13  Objective   Blood pressure (!) 144/95, pulse (!) 107, temperature 99.1 F (37.3 C), temperature source Oral, resp. rate (!) 24, weight 50 kg, SpO2 100%.    Vent Mode: PRVC FiO2 (%):  [40 %] 40 % Set Rate:  [12 bmp] 12 bmp Vt Set:  [350 mL-360 mL] 360 mL PEEP:  [5 cmH20] 5 cmH20 Pressure Support:  [10 cmH20] 10 cmH20 Plateau Pressure:  [21 cmH20] 21 cmH20   Intake/Output Summary (Last 24 hours) at 03/23/2023 0729 Last data filed at 03/23/2023 0700 Gross per 24 hour  Intake 1819.84 ml  Output 1700 ml  Net 119.84 ml   Filed Weights   03/21/23 1410 03/22/23 0500 03/23/23 0500  Weight: 54 kg 50.8 kg 50 kg   Examination: Gen: Acute and chronically ill-appearing woman, ventilated HEENT: ET tube in place, oropharynx somewhat dry Lungs: Decreased to both bases but otherwise clear with ventilated breaths CV: Regular, borderline tachycardia without a murmur Abd: Nondistended, no apparent tenderness, hypoactive bowel sounds Ext: Significant edema bilateral hands and feet Neuro: Opens eyes to stimulation, does not appear to track, does not follow  commands.  Pupils are equal.  Withdraws left lower extremity.  Right upper extremity contracted  Resolved Hospital Problem list   AKI  Assessment & Plan:  Acute metabolic encephalopathy multifactorial due to sepsis, status epilepticus  Status epilepticus prior h/o astrocytoma s/p resxn 2007 LP 12/8 - non concerning for meningitis. B12, RPR, HIV, TSH WNL.  LTM EEG - no seizures but with abnormalities  Brain MR on 03/16/23 - no acute abnormality   Repeat head CT 12/9 - stable  Plan -Continue Keppra and seizure precautions -Home Abilify and Pristiq held -Holding all sedating medications as able -Difficult overall situation as prognosis for meaningful recovery significantly impacted by the patient's underlying debilitation.  Prognosis to get back to even her previous limited baseline is guarded.  Appreciate palliative care medicine input.  Plan to meet with family again on 12/14  Acute respiratory failure secondary to MSSA pneumonia, aspiration Sepsis, improved Intubated for airway protection in setting of AMS.  Strep pneumo urine neg, RVP neg WBC mildly up trending. Blood Cx grew staph only 1/4 - likely contaminant. No longer requiring levophed for pressure support. Trach cx grew MSSA Plan -PRVC 8 cc/kg -Hope to work towards SBT as mental status will allow.  Minimizing sedating medication, fentanyl available for discomfort -Airway protection will be an issue given underlying neurological contributors to her decompensation -Cefazolin, 12/14 is day 7 of antibiotics  Electrolyte derangements Malnutrition -TF at goal -Follow electrolytes for any evidence refeeding or replete as indicated -Banatrol for diarrhea, any associated volume losses, electrolyte abnormality  Anemia No obvious source of bleeding. Hgb stable -Following intermittent CBC -Transfusion threshold hemoglobin 8.0  Systolic and diastolic heart failure Worsening edema and tachycardic today Trops 87>185 12/8. Echo 12/8 - LVEF 45-50%, G2DD Plan -Gentle diuresis as hemodynamics and renal function will tolerate -Telemetry monitoring -Supportive care  Depression -Continuing home amantadine -Holding Abilify, Pristiq   Best Practice (right click and "Reselect all SmartList Selections" daily)   Diet/type: NPO, OG tube for feeds and meds DVT prophylaxis SCD Pressure ulcer(s): present on admission - wound care saw, see note GI prophylaxis: H2B Lines: CVC Foley:   Yes, needed Code Status:  full code Last date of multidisciplinary goals of care discussion -12/13 with brother, including palliative care medicine.  More discussions planned 12/14  Critical care time: 32 minutes  Levy Pupa, MD, PhD 03/23/2023, 7:44 AM [facility] [phone] or if no answer before 7:00PM call [phone] For any issues after 7:00PM please call eLink [phone]

## 2023-03-23 NOTE — Plan of Care (Signed)

## 2023-03-24 LAB — CBC
HCT: 14.8 % — ABNORMAL LOW (ref 36.0–46.0)
HCT: 14.3 % — ABNORMAL LOW (ref 36.0–46.0)
Hemoglobin: 5.1 g/dL — CL (ref 12.0–15.0)
Hemoglobin: 4.9 g/dL — CL (ref 12.0–15.0)
MCH: 26.8 pg (ref 26.0–34.0)
MCH: 26.5 pg (ref 26.0–34.0)
MCHC: 34.5 g/dL (ref 30.0–36.0)
MCHC: 34.3 g/dL (ref 30.0–36.0)
MCV: 77.9 fL — ABNORMAL LOW (ref 80.0–100.0)
MCV: 77.3 fL — ABNORMAL LOW (ref 80.0–100.0)
Platelets: 240 10*3/uL (ref 150–400)
Platelets: 233 10*3/uL (ref 150–400)
RBC: 1.9 MIL/uL — ABNORMAL LOW (ref 3.87–5.11)
RBC: 1.85 MIL/uL — ABNORMAL LOW (ref 3.87–5.11)
RDW: 14.1 % (ref 11.5–15.5)
RDW: 14.3 % (ref 11.5–15.5)
WBC: 17.4 10*3/uL — ABNORMAL HIGH (ref 4.0–10.5)
WBC: 17.2 10*3/uL — ABNORMAL HIGH (ref 4.0–10.5)
nRBC: 0.6 % — ABNORMAL HIGH (ref 0.0–0.2)
nRBC: 0.5 % — ABNORMAL HIGH (ref 0.0–0.2)

## 2023-03-24 LAB — HEMOGLOBIN AND HEMATOCRIT, BLOOD
HCT: 15.5 % — ABNORMAL LOW (ref 36.0–46.0)
HCT: 27.4 % — ABNORMAL LOW (ref 36.0–46.0)
HCT: 28.6 % — ABNORMAL LOW (ref 36.0–46.0)
HCT: 28.3 % — ABNORMAL LOW (ref 36.0–46.0)
Hemoglobin: 5.4 g/dL — CL (ref 12.0–15.0)
Hemoglobin: 9.8 g/dL — ABNORMAL LOW (ref 12.0–15.0)
Hemoglobin: 10.2 g/dL — ABNORMAL LOW (ref 12.0–15.0)
Hemoglobin: 10 g/dL — ABNORMAL LOW (ref 12.0–15.0)

## 2023-03-24 LAB — BASIC METABOLIC PANEL
Anion gap: 6 (ref 5–15)
BUN: 30 mg/dL — ABNORMAL HIGH (ref 6–20)
CO2: 34 mmol/L — ABNORMAL HIGH (ref 22–32)
Calcium: 7.6 mg/dL — ABNORMAL LOW (ref 8.9–10.3)
Chloride: 98 mmol/L (ref 98–111)
Creatinine, Ser: 0.77 mg/dL (ref 0.44–1.00)
GFR, Estimated: >60 mL/min (ref >60)
Glucose, Bld: 126 mg/dL — ABNORMAL HIGH (ref 70–99)
Potassium: 4 mmol/L (ref 3.5–5.1)
Sodium: 138 mmol/L (ref 135–145)

## 2023-03-24 LAB — GLUCOSE, CAPILLARY
Glucose-Capillary: 117 mg/dL — ABNORMAL HIGH (ref 70–99)
Glucose-Capillary: 202 mg/dL — ABNORMAL HIGH (ref 70–99)
Glucose-Capillary: 136 mg/dL — ABNORMAL HIGH (ref 70–99)
Glucose-Capillary: 127 mg/dL — ABNORMAL HIGH (ref 70–99)
Glucose-Capillary: 142 mg/dL — ABNORMAL HIGH (ref 70–99)
Glucose-Capillary: 145 mg/dL — ABNORMAL HIGH (ref 70–99)

## 2023-03-24 LAB — PHOSPHORUS: Phosphorus: 3.5 mg/dL (ref 2.5–4.6)

## 2023-03-24 LAB — MAGNESIUM: Magnesium: 2.3 mg/dL (ref 1.7–2.4)

## 2023-03-24 LAB — PREPARE RBC (CROSSMATCH)

## 2023-03-24 MED ORDER — LEVETIRACETAM 500 MG PO TABS
1000.0000 mg | ORAL_TABLET | Freq: Two times a day (BID) | ORAL | Status: DC
  Administered 2023-03-24 – 2023-03-31 (×16): 1000 mg
  Filled 2023-03-24 (×16): qty 2

## 2023-03-24 MED ORDER — FUROSEMIDE 10 MG/ML IJ SOLN
40.0000 mg | Freq: Once | INTRAMUSCULAR | Status: AC
  Administered 2023-03-24: 40 mg via INTRAVENOUS
  Filled 2023-03-24: qty 4

## 2023-03-24 MED ORDER — SODIUM CHLORIDE 0.9% IV SOLUTION: Freq: Once | INTRAVENOUS | Status: AC

## 2023-03-24 NOTE — Progress Notes (Signed)
eLink Physician-Brief Progress Note Patient Name: Audrey Turner DOB: 09/09/1960 MRN: [id number]   Date of Service  03/24/2023  HPI/Events of Note  62 year old female with severe baseline debility that initially presented with acute metabolic encephalopathy with sepsis and status epilepticus secondary to previous astrocytoma s/p resection.  Her sepsis and respiratory failure improved.   This morning, hemoglobin is 4.9.  Verified with second blood draw.Transfusion threshold hemoglobin 8.0 no active signs of bleeding.  eICU Interventions  Transfuse 2 units PRBC, Lasix x 1 Type and screen pending Every 6 hour hemoglobin     Intervention Category Intermediate Interventions: Bleeding - evaluation and treatment with blood products  Claire Bridge 03/24/2023, 5:22 AM

## 2023-03-24 NOTE — Progress Notes (Signed)
Date and time results received: 03/24/23 0437 (use smartphrase ".now" to insert current time)  Test: Hgb Critical Value: 5.1  Name of Provider Notified: Elink Provider  Orders Received? Or Actions Taken?:  See orders

## 2023-03-24 NOTE — Progress Notes (Addendum)
Date and time results received: 03/24/23 0512 (use smartphrase ".now" to insert current time)  Test: Hgb Critical Value: 4.9  Name of Provider Notified: Elink  Orders Received? Or Actions Taken?:  2nd redraw, awaiting orders  UPDATE: 2 units of PRBCs ordered, type & screen drawn and sent, [name] Oxendine (Brother) gave phone consent for blood products and second nurse, Verlon Au, verified. Consent placed in chart.

## 2023-03-24 NOTE — Progress Notes (Signed)
NAME:  Audrey Turner, MRN:  [id number], DOB:  06-Oct-1960, LOS: 8 ADMISSION DATE:  03/16/2023, CONSULTATION DATE:  12/7 REFERRING MD:  remy, CHIEF COMPLAINT:  acute encephalopathy    History of Present Illness:  62 year old female who is w/c bound at baseline w/ RUE contracted and RLE weakness but oriented x3 after neurosurgery in 2007.  Lives w/ and cared for by brother [name] 5-7d ago was having some trouble swallowing.  Following that over the following days was having runny nose, sore throat and congestions. Her speech quality worsened her brother though laryngitis. Over last 2d PTA decreased PO intake, having more trouble swallowing, not able to take in all her Keppra over last 24 hrs PTA. On 12/7 was at home w/ brother. He thought he witnessed a seizure and called EMS. She received versed on way to ER.  On arrival to ER pt was unresponsive w/ left gaze pref.  Initial ER eval  CT and MRI brain negative for acute changes. Did show remote craniectomy w/ex vacuo dilatation of left lateral ventricle She was loaded w/ Keppra Initial labs: cbg 124, INR 1.6, Na 159, CO2 19, BUN 26, cr 1.53, Trop 145. Lactate 4.1 Neg COVID Blood cultures sent Antibiotics started.  On CCM arrival still not responsive. Rigid w/ possible nuchal rigidity. Left gaze pref. 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  History of seizures on Keppra 100 mg p.o. twice daily. Depression At baseline in [facility] since surgery  Full fxn on left. Able to stand and pivot but weak right leg. Contracted right arm. She is A&O at baseline but needs assist w/ ADLs including eating  Significant Hospital Events: Including procedures, antibiotic start and stop dates in addition to other pertinent events   12/7 admitted  w/ sepsis and acute metabolic enceph w/ break thru seizures, PNA and possible meningitis started on vanc, ceftriaxone and azith also decadron  12/8 LP - non concerning meningitis, d/c decadron and vanc  12/11 -trach cx - MSSA, switched to ancef   Interim History / Subjective:  Hemoglobin noted to be 4.9 this morning without any evidence of active bleeding.  2 units PRBC ordered Palliative care consultation appreciated, CODE STATUS changed to DNR in the event of acute decline 12/14 0.40, PEEP 5 I/O+ 8.2 L total  Objective   Blood pressure 130/79, pulse 100, temperature 98.4 F (36.9 C), temperature source Oral, resp. rate 11, weight 48.8 kg, SpO2 100%.    Vent Mode: PRVC FiO2 (%):  [40 %] 40 % Set Rate:  [12 bmp] 12 bmp Vt Set:  [380 mL] 380 mL PEEP:  [5 cmH20] 5 cmH20 Pressure Support:  [10 cmH20] 10 cmH20 Plateau Pressure:  [13 cmH20] 13 cmH20   Intake/Output Summary (Last 24 hours) at 03/24/2023 0932 Last data filed at 03/24/2023 0631 Gross per 24 hour  Intake 4210 ml  Output 2375 ml  Net 1835 ml   Filed Weights   03/22/23 0500 03/23/23 0500 03/24/23 0500  Weight: 50.8 kg 50 kg 48.8 kg   Examination: Gen: Quite debilitated, acute and chronically ill-appearing HEENT: ET tube in place, oropharynx moist, Lungs: Decreased at both bases, otherwise clear, no crackles or wheezes CV: Regular, borderline tachycardic, no murmur Abd: Hypoactive bowel sounds, nondistended with no apparent tenderness Ext: Edema bilateral hands and  feet Neuro: She opened eyes to voice and stimulation, does not track, does not follow commands.  Withdraws her left lower extremity.  Right upper extremity contraction  Resolved Hospital Problem list   AKI  Assessment & Plan:  Acute metabolic encephalopathy multifactorial due to sepsis, status epilepticus  Status epilepticus prior h/o astrocytoma s/p resxn 2007 LP 12/8 - non concerning for meningitis. B12, RPR, HIV, TSH WNL.  LTM EEG - no seizures but  with abnormalities  Brain MR on 03/16/23 - no acute abnormality  Repeat head CT 12/9 - stable  Plan -Seizure precautions, continue Keppra -Holding home Abilify and Pristiq as well as other sedating medications -Difficult situation.  Prognosis for meaningful recovery significant back to by the patient's underlying debilitation.  Guarded prognosis for the patient to get back to her previously limited baseline.  Appreciate palliative care input.  Agree with DNR status should she decline.  Will need to approach possible one-way extubation after optimization  Acute respiratory failure secondary to MSSA pneumonia, aspiration Sepsis, improved Intubated for airway protection in setting of AMS.  Strep pneumo urine neg, RVP neg WBC mildly up trending. Blood Cx grew staph only 1/4 - likely contaminant. No longer requiring levophed for pressure support. Trach cx grew MSSA Plan -PRVC 8 cc/kg.  Hoping to work towards SBT as her mental status and work of breathing will allow.  Airway protection is significant contributing issue as his overall weakness, debilitation.  Will continue to discuss plans with family regarding possible one-way extubation.  It is clear to them that she would not want prolonged mechanical ventilation -Completed 7 days of antibiotics on 12/14 -Diuresis as tolerated, plan for Lasix today 12/15  Anemia, presumed due to critical illness No obvious source of bleeding.  Acute hemoglobin drop 12/15, question component of volume status/delusional -2 units PRBC being given 12/15 -Follow for any apparent source of active bleeding.  Could consider CT to look for retroperitoneal bleed -Follow CBC posttransfusion and then serially  Electrolyte derangements Malnutrition -Continue tube feeding -Continue to follow electrolytes for any evidence refeeding or replete as indicated -Banatrol for diarrhea, increased on 12/14  Systolic and diastolic heart failure Echo 12/8 - LVEF 45-50%,  G2DD Plan -Continue diuresis on 12/15 -Telemetry monitoring -Supportive care  Depression -Continue home amantadine -Continue to hold Abilify, Pristiq   Best Practice (right click and "Reselect all SmartList Selections" daily)   Diet/type: NPO, OG tube for feeds and meds DVT prophylaxis SCD Pressure ulcer(s): present on admission - wound care saw, see note GI prophylaxis: H2B Lines: CVC Foley:  Yes, needed Code Status:  DNR Last date of multidisciplinary goals of care discussion -12/14 with PC, code status DNR. Discussing goals and possible one-way extubation soon  Critical care time: 32 minutes  Levy Pupa, MD, PhD 03/24/2023, 7:12 AM [facility] [phone] or if no answer before 7:00PM call [phone] For any issues after 7:00PM please call eLink [phone]

## 2023-03-24 NOTE — Progress Notes (Signed)
Daily Progress Note   Patient Name: Audrey Turner       Date: 03/24/2023 DOB: [birth year]  Age: 62 y.o. MRN#: [id number] Attending Physician: Leslye Peer, MD Primary Care Physician: Joaquim Nam, MD Admit Date: 03/16/2023  Reason for Consultation/Follow-up: Establishing goals of care  Length of Stay: 8  Current Medications: Scheduled Meds:   amantadine  100 mg Per Tube BID   Chlorhexidine Gluconate Cloth  6 each Topical Daily   famotidine  20 mg Per Tube Daily   fiber supplement (BANATROL TF)  60 mL Per Tube Q8H   free water  200 mL Per Tube Q6H   furosemide  40 mg Intravenous Once   Gerhardt's butt cream   Topical TID   heparin injection (subcutaneous)  5,000 Units Subcutaneous Q8H   insulin aspart  0-15 Units Subcutaneous Q4H   leptospermum manuka honey  1 Application Topical Daily   levETIRAcetam  1,000 mg Per Tube BID   nutrition supplement (JUVEN)  1 packet Per Tube BID BM   mouth rinse  15 mL Mouth Rinse Q2H   thiamine  100 mg Per Tube Daily    Continuous Infusions:  feeding supplement (OSMOLITE 1.2 CAL) 50 mL/hr at 03/24/23 0800    PRN Meds: docusate sodium, fentaNYL (SUBLIMAZE) injection, hydrALAZINE, labetalol, lip balm, mouth rinse, polyethylene glycol  Physical Exam Vitals reviewed.  Constitutional:      Appearance: She is ill-appearing.     Interventions: She is intubated.     Comments: NG tube  Cardiovascular:     Rate and Rhythm: Tachycardia present.  Pulmonary:     Effort: She is intubated.  Neurological:     Mental Status: She is lethargic.             Vital Signs: BP (!) 112/57   Pulse 94   Temp 99.7 F (37.6 C) (Oral)   Resp 19   Wt 48.8 kg   SpO2 100%   BMI 21.01 kg/m  SpO2: SpO2: 100 % O2 Device: O2 Device:  Ventilator O2 Flow Rate: O2 Flow Rate (L/min): 4 L/min  Palliative Assessment/Data: 30% with feeds      Patient Active Problem List   Diagnosis Date Noted   Acute hypoxic respiratory failure (HCC) 03/17/2023   URI (upper respiratory infection) 03/17/2023   Sepsis (HCC)  03/16/2023   Epistaxis 10/15/2021   Cold extremities 06/07/2021   Insomnia 01/22/2021   Healthcare maintenance 09/08/2019   Contracture of hand 09/08/2019   Urinary incontinence 06/12/2019   Medicare annual wellness visit, subsequent 06/11/2018   Lower extremity edema 06/11/2018   HLD (hyperlipidemia) 05/13/2017   Localization-related symptomatic epilepsy and epileptic syndromes with complex partial seizures, not intractable, without status epilepticus (HCC) 07/17/2016   Hx of astrocytoma 07/17/2016   Hypoxia, sleep related 07/03/2016   Convulsions/seizures (HCC) 12/14/2015   Advance care planning 12/13/2015   Muscular deconditioning 11/10/2015   Hyperglycemia 03/12/2013   Obstructive sleep apnea 12/22/2009   Depression 08/14/2007   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 female  with past medical history of astrocytoma s/p chemo, radiation, and multiple resections (2007), seizures, and depression admitted on 03/16/2023 with altered mental status.    Acute metabolic encephalopathy felt to be secondary to sepsis from pneumonia and status epilepticus. She was intubated for airway protection. She has now been off sedation for several days with limited improvement in mental status.   Patient faces treatment option decisions, advanced directive decisions and anticipatory care needs.  Today's Discussion: Patient in room in NAD. Received update from patient's RN. Patient required a blood transfusion this morning for Hgb 4.9. Remains on ventilator. No family at bedside.  0945: Called and updated patient's brother/ HCPOA Audrey Turner. He is  taking care of his mother and will not be in until later today. He has no questions or concerns at this time. We discuss plan to meet on Tuesday.  Encouraged the family to keep discussing goals of care emphasizing the importance of considering her quality of life, overall suffering, what would be acceptable to her in the future.  Encouraged family to call PMT with questions or concerns. Plan to meet again with PMT Tuesday 12/17 at noon.    Recommendations/Plan: DNR Full scope Time for outcomes Encouraged family to continue discussing goals of care Continued PMT support-- family meeting Tuesday 03/26/2023 at noon   Code Status:    Code Status Orders  (From admission, onward)           Start     Ordered   03/23/23 1328  Do not attempt resuscitation (DNR) Pre-Arrest Interventions Desired  (Code Status)  Continuous       Question Answer Comment  If pulseless and not breathing No CPR or chest compressions.   In Pre-Arrest Conditions (Patient Has Pulse and Is Breathing) May intubate, use advanced airway interventions and cardioversion/ACLS medications if appropriate or indicated. May transfer to ICU.   Consent: Discussion documented in EHR or advanced directives reviewed      03/23/23 1327         Extensive chart review has been completed prior to seeing the patient and meeting with the family including labs, vital signs, imaging, progress/consult notes, orders, medications, and available advance directive documents.   Care plan was discussed with bedside RN  Time spent: 25 minutes  Thank you for allowing the Palliative Medicine Team to assist in the care of this patient.   Sherryll Burger, NP  Please contact Palliative Medicine Team phone at [phone] for questions and concerns.

## 2023-03-25 ENCOUNTER — Inpatient Hospital Stay (HOSPITAL_COMMUNITY): Payer: Medicare Other

## 2023-03-25 DIAGNOSIS — J189 Pneumonia, unspecified organism: Secondary | ICD-10-CM

## 2023-03-25 DIAGNOSIS — L8990 Pressure ulcer of unspecified site, unspecified stage: Secondary | ICD-10-CM | POA: Insufficient documentation

## 2023-03-25 LAB — BASIC METABOLIC PANEL
Anion gap: 6 (ref 5–15)
BUN: 34 mg/dL — ABNORMAL HIGH (ref 6–20)
CO2: 35 mmol/L — ABNORMAL HIGH (ref 22–32)
Calcium: 7.8 mg/dL — ABNORMAL LOW (ref 8.9–10.3)
Chloride: 93 mmol/L — ABNORMAL LOW (ref 98–111)
Creatinine, Ser: 0.51 mg/dL (ref 0.44–1.00)
GFR, Estimated: >60 mL/min (ref >60)
Glucose, Bld: 120 mg/dL — ABNORMAL HIGH (ref 70–99)
Potassium: 4 mmol/L (ref 3.5–5.1)
Sodium: 134 mmol/L — ABNORMAL LOW (ref 135–145)

## 2023-03-25 LAB — MAGNESIUM: Magnesium: 2 mg/dL (ref 1.7–2.4)

## 2023-03-25 LAB — CBC
HCT: 27.9 % — ABNORMAL LOW (ref 36.0–46.0)
Hemoglobin: 9.6 g/dL — ABNORMAL LOW (ref 12.0–15.0)
MCH: 27.7 pg (ref 26.0–34.0)
MCHC: 34.4 g/dL (ref 30.0–36.0)
MCV: 80.4 fL (ref 80.0–100.0)
Platelets: 244 10*3/uL (ref 150–400)
RBC: 3.47 MIL/uL — ABNORMAL LOW (ref 3.87–5.11)
RDW: 15.9 % — ABNORMAL HIGH (ref 11.5–15.5)
WBC: 19.8 10*3/uL — ABNORMAL HIGH (ref 4.0–10.5)
nRBC: 0.6 % — ABNORMAL HIGH (ref 0.0–0.2)

## 2023-03-25 LAB — GLUCOSE, CAPILLARY
Glucose-Capillary: 108 mg/dL — ABNORMAL HIGH (ref 70–99)
Glucose-Capillary: 153 mg/dL — ABNORMAL HIGH (ref 70–99)
Glucose-Capillary: 146 mg/dL — ABNORMAL HIGH (ref 70–99)
Glucose-Capillary: 109 mg/dL — ABNORMAL HIGH (ref 70–99)
Glucose-Capillary: 116 mg/dL — ABNORMAL HIGH (ref 70–99)
Glucose-Capillary: 122 mg/dL — ABNORMAL HIGH (ref 70–99)

## 2023-03-25 MED ORDER — FUROSEMIDE 10 MG/ML IJ SOLN
40.0000 mg | Freq: Two times a day (BID) | INTRAMUSCULAR | Status: AC
Start: 2023-03-25 — End: 2023-03-25
  Administered 2023-03-25 (×2): 40 mg via INTRAVENOUS
  Filled 2023-03-25 (×2): qty 4

## 2023-03-25 MED ORDER — VITAMIN C 500 MG PO TABS
500.0000 mg | ORAL_TABLET | Freq: Every day | ORAL | Status: DC
  Administered 2023-03-25 – 2023-04-07 (×14): 500 mg
  Filled 2023-03-25 (×14): qty 1

## 2023-03-25 MED ORDER — ZINC SULFATE 220 (50 ZN) MG PO CAPS
220.0000 mg | ORAL_CAPSULE | Freq: Every day | ORAL | Status: AC
  Administered 2023-03-25 – 2023-04-07 (×14): 220 mg
  Filled 2023-03-25 (×14): qty 1

## 2023-03-25 NOTE — Progress Notes (Addendum)
NAME:  Audrey Turner, MRN:  [id number], DOB:  02-Feb-1961, LOS: 9 ADMISSION DATE:  03/16/2023, CONSULTATION DATE:  12/07 REFERRING MD:  Monica Martinez, CHIEF COMPLAINT:  Acute encephalopathy   History of present illness   62 year old female who is w/c bound at baseline w/ RUE contracted and RLE weakness but oriented x3 after neurosurgery in 2007.  Lives w/ and cared for by brother [name] 5-7d ago was having some trouble swallowing.  Following that over the following days was having runny nose, sore throat and congestions. Her speech quality worsened her brother though laryngitis. Over last 2d PTA decreased PO intake, having more trouble swallowing, not able to take in all her Keppra over last 24 hrs PTA. On 12/7 was at home w/ brother. He thought he witnessed a seizure and called EMS. She received versed on way to ER.  On arrival to ER pt was unresponsive w/ left gaze pref.  Initial ER eval  CT and MRI brain negative for acute changes. Did show remote craniectomy w/ex vacuo dilatation of left lateral ventricle She was loaded w/ Keppra Initial labs: cbg 124, INR 1.6, Na 159, CO2 19, BUN 26, cr 1.53, Trop 145. Lactate 4.1 Neg COVID Blood cultures sent Antibiotics started.  On CCM arrival still not responsive. Rigid w/ possible nuchal rigidity. Left gaze pref. 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  History of seizures on Keppra 100 mg p.o. twice daily. Depression At baseline in [facility] since surgery  Full fxn on left. Able to stand and pivot but weak right leg. Contracted right arm. She is A&O at baseline but needs assist w/ ADLs including eating  Significant Hospital Events   12/7 admitted w/ sepsis and acute metabolic enceph w/ break thru seizures, PNA and possible meningitis started on  vanc, ceftriaxone and azith also decadron  12/8 LP - non concerning meningitis, d/c decadron and vanc  12/11 -trach cx - MSSA, switched to ancef 12/14- completed abx course  Interim history/subjective:  No overnight events. Remains intubated.  Patient seen at bedside, opens eyes to voice and tracks speaker. Unable to follow commands.  Objective   Blood pressure 119/65, pulse 98, temperature 98.1 F (36.7 C), temperature source Oral, resp. rate 17, weight 47.5 kg, SpO2 100%.    Vent Mode: PRVC FiO2 (%):  [40 %] 40 % Set Rate:  [12 bmp] 12 bmp Vt Set:  [360 mL] 360 mL PEEP:  [5 cmH20] 5 cmH20 Pressure Support:  [5 cmH20] 5 cmH20 Plateau Pressure:  [14 cmH20] 14 cmH20   Intake/Output Summary (Last 24 hours) at 03/25/2023 4098 Last data filed at 03/25/2023 0600 Gross per 24 hour  Intake 3173.67 ml  Output 3320 ml  Net -146.33 ml   Filed Weights   03/23/23 0500 03/24/23 0500 03/25/23 0215  Weight: 50 kg 48.8 kg 47.5 kg    Examination: Gen: chronically ill appearing HEENT: ET tube in place, oropharynx moist LUNG: decreased at both bases, no crackles or wheezes appreciated CV: Regular, borderline tachycardic Abd: hypoactive bowel sounds, non-distended  Ext: edema bilaterally to feet and hands Mental Status: Patient opens eyes to verbal stimuli, tracks speaker Motor: Right upper extremity contracted, no purposeful movement, does not follow commands, withdraws left lower extremity  Labs   CBC: Recent Labs  Lab 03/20/23 0354 03/21/23 0512 03/24/23  0413 03/24/23 0449 03/24/23 0543 03/24/23 1423 03/24/23 1739 03/24/23 2307 03/25/23 0308  WBC 19.1* 15.1* 17.4* 17.2*  --   --   --   --  19.8*  HGB 10.0* 9.3* 5.1* 4.9* 5.4* 9.8* 10.2* 10.0* 9.6*  HCT 29.2* 26.6* 14.8* 14.3* 15.5* 27.4* 28.6* 28.3* 27.9*  MCV 76.4* 76.0* 77.9* 77.3*  --   --   --   --  80.4  PLT 196 172 240 233  --   --   --   --  244    Basic Metabolic Panel: Recent Labs  Lab 03/21/23 0512  03/21/23 0900 03/21/23 1737 03/22/23 0747 03/23/23 0409 03/24/23 0326 03/25/23 0308  NA 143  --  141 140 138 138 134*  K 4.4  --  3.9 3.7 4.6 4.0 4.0  CL 107  --  106 101 100 98 93*  CO2 27  --  28 29 31  34* 35*  GLUCOSE 109*  --  188* 148* 143* 126* 120*  BUN 26*  --  25* 25* 31* 30* 34*  CREATININE 0.45  --  0.63 0.60 0.62 0.77 0.51  CALCIUM 7.9*  --  7.6* 7.8* 7.5* 7.6* 7.8*  MG 2.0 2.0  --  1.9 2.4 2.3 2.0  PHOS 2.2*  --  2.8 3.8 3.5 3.5  --    CXR 12/16 Mild interstitial edema present bilaterally  Resolved Hospital Problem list   AKI  Assessment & Plan:  62 y.o. year old female with  Acute respiratory failure secondary to MSSA pneumonia Sepsis, improved Leukocytosis She was intubated for airway protection.  Strep pneumo urine negative, RVP negative.  White blood cell count continues to trend upwards at 19.8 today.  Blood cultures grew Staph capitis in 1 of 4, likely contaminant. Unclear why leukocytosis is up-trending, if fevers low threshold to restart broad spectrum antibiotics.  Plan -PRVC 8 cc per keg. -Appreciate palliative care support in this difficult case.  Family planning to come in on Tuesday at noon to continue discussing plans. -Completed 7 days of antibiotics 12/14 -Continue diuresis as tolerated, lasix 40 mg BID   Anemia-resolved Hemoglobin at 9.6 this morning.  Received 2 units yesterday and increased from 5.4.  She also received 2 doses of Lasix 40 mg yesterday.  Hematocrit increased in similar fashion.  This points towards volume status/delusional component of anemia yesterday.  No obvious source of bleeding.  Plan -Discontinue every 6 hour H&H  Electrolyte derangements Malnutrition Continue tube feeding Continue Banatrol Trend BMP  Systolic and diastolic heart failure Echo 12/8 with EF of 45 to 50%, grade 2 diastolic dysfunction  Plan -Continue diuresis on 12/16 -Telemetry monitoring -Supportive care  Depression -Continue amantadine,  Abilify and Pristiq   Best practice:  Diet/type: NPO, OG tube for feeds and meds DVT prophylaxis SCD Pressure ulcer(s): present on admission - wound care saw, see note GI prophylaxis: H2B Lines: CVC Foley:  Yes, needed Code Status:  DNR Last date of multidisciplinary goals of care discussion -12/14 with PC, code status DNR. Discussing goals and possible one-way extubation soon   Roxan Yamamoto M. Virgilene Stryker, D.O.  Internal Medicine Resident, PGY-3 [facility]  Pager: [phone] 6:20 AM, 03/25/2023

## 2023-03-25 NOTE — Progress Notes (Signed)
[name] updated at bedside  [employer]. Halie Gass, D.O.  Internal Medicine Resident, PGY-3 [facility]  Pager: [phone] 3:08 PM, 03/25/2023

## 2023-03-25 NOTE — Progress Notes (Signed)
Nutrition Follow-up  DOCUMENTATION CODES:   Not applicable  INTERVENTION:  Continue TF via Cortrak: Osmolite 1.2 at 57ml/hr ( per day) Provides: 1440 kcal, 67g protein, 984 ml free water daily  Banatrol TID-provides 45kcal, 5g soluble fiber and 2g protein per serving.  Discontinue juven BID to see if this will aid in reduction of loose stools  Vitamin C 500mg  daily x30 days and Zinc 220 mg x14 days per tube to support wound healing  NUTRITION DIAGNOSIS:   Inadequate oral intake related to acute illness as evidenced by NPO status. - remains applicable  GOAL:   Patient will meet greater than or equal to 90% of their needs - goal met via TF  MONITOR:   Labs, Vent status, Weight trends, TF tolerance, Skin  REASON FOR ASSESSMENT:   Ventilator, Consult Enteral/tube feeding initiation and management  ASSESSMENT:   Pt admitted with metabolic encephalopathy secondary to sepsis and PNA. PMH significant for astrocytoma s/p chemo, radiation and surgery (2007) subsequent seizure disorder.  12/7 admitted, intubated 12/8 s/p LP with IR; negative for infectious factors 12/13 s/p cortrak placement tip within stomach  PMT following. Plans for family discussion on Tuesday.   Discussed in IDT rounds.  Pt opens eyes, tracks voice but does not follow commands.  Plans to continue SBT and consider extubation in next couple days.   TF remains infusing via Cortrak at goal. Abdomen soft, non-distended, continues with loose stools, FMS remains in place. Banatrol added TID on 12/14. Will discontinue Juven for now to see if this help with stool output. Vitamin C and zinc added to support wound healing in the meantime.   Patient is currently intubated on ventilator support MV: 7.6 L/min Temp (24hrs), Avg:98.8 F (37.1 C), Min:98.1 F (36.7 C), Max:99.5 F (37.5 C)  Admit weight: 49 kg  Current weight: 47.5 kg   Intake/Output Summary (Last 24 hours) at 03/25/2023 1517 Last data  filed at 03/25/2023 1400 Gross per 24 hour  Intake 2770 ml  Output 3150 ml  Net -380 ml   Net IO Since Admission: 7,875.25 mL [03/25/23 1517]  Nutritionally Relevant Medications: Scheduled Meds:  amantadine  100 mg Per Tube BID   ascorbic acid  500 mg Per Tube Daily   Chlorhexidine Gluconate Cloth  6 each Topical Daily   famotidine  20 mg Per Tube Daily   fiber supplement (BANATROL TF)  60 mL Per Tube Q8H   furosemide  40 mg Intravenous Q12H   Gerhardt's butt cream   Topical TID   insulin aspart  0-15 Units Subcutaneous Q4H   leptospermum manuka honey  1 Application Topical Daily   levETIRAcetam  1,000 mg Per Tube BID   mouth rinse  15 mL Mouth Rinse Q2H   thiamine  100 mg Per Tube Daily   zinc sulfate (50mg  elemental zinc)  220 mg Per Tube Daily   Continuous Infusions:  feeding supplement (OSMOLITE 1.2 CAL) 50 mL/hr at 03/25/23 1400   PRN Meds:.docusate sodium, fentaNYL (SUBLIMAZE) injection, hydrALAZINE, labetalol, lip balm, mouth rinse, polyethylene glycol  Labs Reviewed: Sodium 134 BUN 34 CBG ranges from 108-153 mg/dL over the last 24 hours  UOP: 2.8L x24 hours  Diet Order:   Diet Order             Diet NPO time specified  Diet effective now                   EDUCATION NEEDS:   No education needs have been identified at  this time  Skin:  Skin Assessment: Skin Integrity Issues: Skin Integrity Issues:: Stage III Stage III: R buttocks Unstageable: R sacrum  Last BM:  x24 hours via FMS  Height:   Ht Readings from Last 1 Encounters:  03/14/23 5' (1.524 m)    Weight:   Wt Readings from Last 1 Encounters:  03/25/23 47.5 kg    Ideal Body Weight:  45.5 kg  BMI:  Body mass index is 20.45 kg/m.  Estimated Nutritional Needs:   Kcal:  1400-1600  Protein:  65-80g  Fluid:  >/=1.5L  Drusilla Kanner, RDN, LDN Clinical Nutrition

## 2023-03-26 DIAGNOSIS — Z66 Do not resuscitate: Secondary | ICD-10-CM

## 2023-03-26 LAB — BASIC METABOLIC PANEL
Anion gap: 8 (ref 5–15)
BUN: 38 mg/dL — ABNORMAL HIGH (ref 6–20)
CO2: 36 mmol/L — ABNORMAL HIGH (ref 22–32)
Calcium: 8.2 mg/dL — ABNORMAL LOW (ref 8.9–10.3)
Chloride: 92 mmol/L — ABNORMAL LOW (ref 98–111)
Creatinine, Ser: 0.56 mg/dL (ref 0.44–1.00)
GFR, Estimated: >60 mL/min (ref >60)
Glucose, Bld: 167 mg/dL — ABNORMAL HIGH (ref 70–99)
Potassium: 3.9 mmol/L (ref 3.5–5.1)
Sodium: 136 mmol/L (ref 135–145)

## 2023-03-26 LAB — CBC
HCT: 26.6 % — ABNORMAL LOW (ref 36.0–46.0)
Hemoglobin: 9.1 g/dL — ABNORMAL LOW (ref 12.0–15.0)
MCH: 28.4 pg (ref 26.0–34.0)
MCHC: 34.2 g/dL (ref 30.0–36.0)
MCV: 83.1 fL (ref 80.0–100.0)
Platelets: 263 10*3/uL (ref 150–400)
RBC: 3.2 MIL/uL — ABNORMAL LOW (ref 3.87–5.11)
RDW: 17 % — ABNORMAL HIGH (ref 11.5–15.5)
WBC: 18.5 10*3/uL — ABNORMAL HIGH (ref 4.0–10.5)
nRBC: 0.2 % (ref 0.0–0.2)

## 2023-03-26 LAB — GLUCOSE, CAPILLARY
Glucose-Capillary: 158 mg/dL — ABNORMAL HIGH (ref 70–99)
Glucose-Capillary: 140 mg/dL — ABNORMAL HIGH (ref 70–99)
Glucose-Capillary: 133 mg/dL — ABNORMAL HIGH (ref 70–99)
Glucose-Capillary: 126 mg/dL — ABNORMAL HIGH (ref 70–99)
Glucose-Capillary: 176 mg/dL — ABNORMAL HIGH (ref 70–99)
Glucose-Capillary: 87 mg/dL (ref 70–99)

## 2023-03-26 MED ORDER — HYDRALAZINE HCL 20 MG/ML IJ SOLN: 10.0000 mg | Freq: Four times a day (QID) | INTRAMUSCULAR | Status: DC | PRN

## 2023-03-26 MED ORDER — FUROSEMIDE 10 MG/ML IJ SOLN
40.0000 mg | Freq: Two times a day (BID) | INTRAMUSCULAR | Status: AC
  Administered 2023-03-26 (×2): 40 mg via INTRAVENOUS
  Filled 2023-03-26 (×2): qty 4

## 2023-03-26 MED ORDER — HEPARIN SODIUM (PORCINE) 5000 UNIT/ML IJ SOLN
5000.0000 [IU] | Freq: Three times a day (TID) | INTRAMUSCULAR | Status: DC
Start: 2023-03-26 — End: 2023-04-07
  Administered 2023-03-26 – 2023-04-07 (×36): 5000 [IU] via SUBCUTANEOUS
  Filled 2023-03-26 (×35): qty 1

## 2023-03-26 NOTE — Progress Notes (Deleted)
Patient transferred with above Belongings to 2W10. I have also called and let his wife Lawson Fiscal know that he has moved

## 2023-03-26 NOTE — Progress Notes (Signed)
  Daily Progress Note   Patient Name: Audrey Turner       Date: 03/26/2023 DOB: 1961-02-19  Age: 62 y.o. MRN#: [id number] Attending Physician: Leslye Peer, MD Primary Care Physician: Joaquim Nam, MD Admit Date: 03/16/2023 Length of Stay: 10 days  Reason for Consultation/Follow-up: {Reason for Consult:23484}  HPI/Patient Profile:  ***  Subjective:   Subjective: Chart Reviewed. Updates received. Patient Assessed. Created space and opportunity for patient  and family to explore thoughts and feelings regarding current medical situation.  Today's Discussion: ***  Review of Systems  Objective:   Vital Signs:  BP 122/88   Pulse (!) 105   Temp 98.9 F (37.2 C) (Oral)   Resp (!) 25   Wt 47 kg   SpO2 100%   BMI 20.24 kg/m   Physical Exam  Palliative Assessment/Data: ***    Existing Vynca/ACP Documentation: ***  Assessment & Plan:   Impression: Present on Admission:  Sepsis (HCC)  ***  SUMMARY OF RECOMMENDATIONS   ***  Symptom Management:  ***  Code Status: {Palliative Code status:23503}  Prognosis: {Palliative Care Prognosis:23504}  Discharge Planning: {Palliative dispostion:23505}  Discussed with: ***  Thank you for allowing [place] to participate in the care of [name] PMT will continue to support holistically.  Time Total: ***  Detailed review of medical records (labs, imaging, vital signs), medically appropriate exam, discussed with treatment team, counseling and education to patient, family, & staff, documenting clinical information, medication management, coordination of care  Wynne Dust, NP Palliative Medicine Team  Team Phone # [phone] (Nights/Weekends)  12/06/2020, 8:17 AM

## 2023-03-26 NOTE — Progress Notes (Signed)
NAME:  Audrey Turner, MRN:  [id number], DOB:  01-Jul-1960, LOS: 10 ADMISSION DATE:  03/16/2023, CONSULTATION DATE:  12/07 REFERRING MD:  Monica Martinez, CHIEF COMPLAINT:  Acute encephalopathy   History of present illness   62 year old female who is w/c bound at baseline w/ RUE contracted and RLE weakness but oriented x3 after neurosurgery in 2007.  Lives w/ and cared for by brother Audrey Turner 5-7d ago was having some trouble swallowing.  Following that over the following days was having runny nose, sore throat and congestions. Her speech quality worsened her brother though laryngitis. Over last 2d PTA decreased PO intake, having more trouble swallowing, not able to take in all her Keppra over last 24 hrs PTA. On 12/7 was at home w/ brother. He thought he witnessed a seizure and called EMS. She received versed on way to ER.  On arrival to ER pt was unresponsive w/ left gaze pref.  Initial ER eval  CT and MRI brain negative for acute changes. Did show remote craniectomy w/ex vacuo dilatation of left lateral ventricle She was loaded w/ Keppra Initial labs: cbg 124, INR 1.6, Na 159, CO2 19, BUN 26, cr 1.53, Trop 145. Lactate 4.1 Neg COVID Blood cultures sent Antibiotics started.  On CCM arrival still not responsive. Rigid w/ possible nuchal rigidity. Left gaze pref. 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  History of seizures on Keppra 100 mg p.o. twice daily. Depression At baseline in [facility] since surgery  Full fxn on left. Able to stand and pivot but weak right leg. Contracted right arm. She is A&O at baseline but needs assist w/ ADLs including eating  Significant Hospital Events   12/7 admitted w/ sepsis and acute metabolic enceph w/ break thru seizures, PNA and possible meningitis started on  vanc, ceftriaxone and azith also decadron  12/8 LP - non concerning meningitis, d/c decadron and vanc  12/11 -trach cx - MSSA, switched to ancef 12/14- completed abx course  Interim history/subjective:  Received 1 dose of IV labetalol yesterday afternoon for systolic of 190 Mildly tachycardic 100s, afebrile  Patient seen at bedside this AM. She tracks speaker and follows some commands.   Objective   Blood pressure 116/74, pulse (!) 102, temperature 99.2 F (37.3 C), temperature source Axillary, resp. rate 18, weight 47.5 kg, SpO2 100%.    Vent Mode: PRVC FiO2 (%):  [40 %] 40 % Set Rate:  [12 bmp] 12 bmp Vt Set:  [360 mL] 360 mL PEEP:  [5 cmH20] 5 cmH20 Pressure Support:  [8 cmH20-10 cmH20] 10 cmH20 Plateau Pressure:  [12 cmH20-15 cmH20] 15 cmH20   Intake/Output Summary (Last 24 hours) at 03/26/2023 0615 Last data filed at 03/26/2023 0400 Gross per 24 hour  Intake 1500 ml  Output 1975 ml  Net -475 ml   Filed Weights   03/23/23 0500 03/24/23 0500 03/25/23 0215  Weight: 50 kg 48.8 kg 47.5 kg   Output net -13 L  Examination: Gen: chronically ill appearing HEENT: ET tube in place, oropharynx moist LUNG: decreased at both bases, no crackles or wheezes appreciated CV: Regular, borderline tachycardic Abd: hypoactive bowel sounds, non-distended  Ext: edema to left hand, improved from yesterday Mental Status: Patient opens eyes to voice, tracks speaker, follows some commands with left hand squeeze Motor: Right upper extremity contracted, no  purposeful movement  Labs   CBC: WBCs 19.8-> 18.5 Hgb 9.6->9.1  Basic Metabolic Panel: Na 134-> 136 Bicarb 35->36  CXR 12/16 Mild interstitial edema present bilaterally  Resolved Hospital Problem list   AKI Sepsis  Assessment & Plan:  62 y.o. year old female with  Acute respiratory failure secondary to MSSA pneumonia Leukocytosis- improving She was intubated for airway protection.  Strep pneumo urine negative, RVP  negative.  Blood cultures grew Staph capitis in 1 of 4, likely contaminant. Leukocytosis from 19.8 to 18.5 this morning, she has remained afebrile. If fevers would get repeat blood cultures.  -PRVC 8 cc per keg. -Appreciate palliative care support in this case.  Family planning to come in on Tuesday at noon to continue discussing plans. Question will be goals of care in the case that she requires re-intubation. If this was needed she would be facing vent dependency and likely PEG tube placement -Completed 7 days of antibiotics 12/14 -Continue diuresis as tolerated, lasix 40 mg BID   Anemia-resolved Hemoglobin at 9.6 this morning.  Received 2 units 12/15 and increased from 5.4.  She also received 2 doses of Lasix 40 mg yesterday.  Hematocrit increased in similar fashion.  This points towards volume status/delusional component of anemia yesterday.  No obvious source of bleeding. Ok with restarting DVT ppx   -trend CBC -restart dvt ppx with heparin  Electrolyte derangements Malnutrition Continue tube feeding Trend BMP  Systolic and diastolic heart failure Echo 12/8 with EF of 45 to 50%, grade 2 diastolic dysfunction  Plan -Continue diuresis on 12/17 -Telemetry monitoring -Supportive care  Depression -Continue amantadine, Abilify and Pristiq   Best practice:  Diet/type: NPO, OG tube for feeds and meds DVT prophylaxis SCD Pressure ulcer(s): present on admission - wound care saw, see note GI prophylaxis: H2B Lines: CVC Foley:  Yes, needed Code Status:  DNR Last date of multidisciplinary goals of care discussion -12/14 with PC, code status DNR. Discussing goals and possible one-way extubation 12/17 with daughters and caregiver, patient's brother.   Audrey Turner M. Audrey Turner, D.O.  Internal Medicine Resident, PGY-3 [facility]  Pager: [phone] 6:15 AM, 03/26/2023

## 2023-03-27 DIAGNOSIS — A4101 Sepsis due to Methicillin susceptible Staphylococcus aureus: Secondary | ICD-10-CM

## 2023-03-27 DIAGNOSIS — R4182 Altered mental status, unspecified: Secondary | ICD-10-CM

## 2023-03-27 LAB — BASIC METABOLIC PANEL
Anion gap: 16 — ABNORMAL HIGH (ref 5–15)
BUN: 38 mg/dL — ABNORMAL HIGH (ref 6–20)
CO2: 33 mmol/L — ABNORMAL HIGH (ref 22–32)
Calcium: 8.3 mg/dL — ABNORMAL LOW (ref 8.9–10.3)
Chloride: 92 mmol/L — ABNORMAL LOW (ref 98–111)
Creatinine, Ser: 0.66 mg/dL (ref 0.44–1.00)
GFR, Estimated: >60 mL/min (ref >60)
Glucose, Bld: 133 mg/dL — ABNORMAL HIGH (ref 70–99)
Potassium: 3.7 mmol/L (ref 3.5–5.1)
Sodium: 141 mmol/L (ref 135–145)

## 2023-03-27 LAB — GLUCOSE, CAPILLARY
Glucose-Capillary: 157 mg/dL — ABNORMAL HIGH (ref 70–99)
Glucose-Capillary: 170 mg/dL — ABNORMAL HIGH (ref 70–99)
Glucose-Capillary: 165 mg/dL — ABNORMAL HIGH (ref 70–99)
Glucose-Capillary: 154 mg/dL — ABNORMAL HIGH (ref 70–99)
Glucose-Capillary: 179 mg/dL — ABNORMAL HIGH (ref 70–99)
Glucose-Capillary: 237 mg/dL — ABNORMAL HIGH (ref 70–99)

## 2023-03-27 LAB — CBC
HCT: 28.2 % — ABNORMAL LOW (ref 36.0–46.0)
Hemoglobin: 9.5 g/dL — ABNORMAL LOW (ref 12.0–15.0)
MCH: 28.7 pg (ref 26.0–34.0)
MCHC: 33.7 g/dL (ref 30.0–36.0)
MCV: 85.2 fL (ref 80.0–100.0)
Platelets: 310 10*3/uL (ref 150–400)
RBC: 3.31 MIL/uL — ABNORMAL LOW (ref 3.87–5.11)
RDW: 17.7 % — ABNORMAL HIGH (ref 11.5–15.5)
WBC: 18.5 10*3/uL — ABNORMAL HIGH (ref 4.0–10.5)
nRBC: 0 % (ref 0.0–0.2)

## 2023-03-27 MED ORDER — POTASSIUM CHLORIDE 20 MEQ PO PACK
40.0000 meq | PACK | Freq: Once | ORAL | Status: AC
  Administered 2023-03-27: 40 meq
  Filled 2023-03-27: qty 2

## 2023-03-27 MED ORDER — FUROSEMIDE 10 MG/ML IJ SOLN
40.0000 mg | Freq: Once | INTRAMUSCULAR | Status: AC
  Administered 2023-03-27: 40 mg via INTRAVENOUS
  Filled 2023-03-27: qty 4

## 2023-03-27 NOTE — Progress Notes (Signed)
NAME:  Audrey Turner, MRN:  [id number], DOB:  [birth year], LOS: 11 ADMISSION DATE:  03/16/2023, CONSULTATION DATE:  12/07 REFERRING MD:  Monica Martinez, CHIEF COMPLAINT:  Acute encephalopathy   History of present illness   62 year old female who is w/c bound at baseline w/ RUE contracted and RLE weakness but oriented x3 after neurosurgery in 2007.  Lives w/ and cared for by brother [name] 5-7d ago was having some trouble swallowing.  Following that over the following days was having runny nose, sore throat and congestions. Her speech quality worsened her brother though laryngitis. Over last 2d PTA decreased PO intake, having more trouble swallowing, not able to take in all her Keppra over last 24 hrs PTA. On 12/7 was at home w/ brother. He thought he witnessed a seizure and called EMS. She received versed on way to ER.  On arrival to ER pt was unresponsive w/ left gaze pref.  Initial ER eval  CT and MRI brain negative for acute changes. Did show remote craniectomy w/ex vacuo dilatation of left lateral ventricle She was loaded w/ Keppra Initial labs: cbg 124, INR 1.6, Na 159, CO2 19, BUN 26, cr 1.53, Trop 145. Lactate 4.1 Neg COVID Blood cultures sent Antibiotics started.  On CCM arrival still not responsive. Rigid w/ possible nuchal rigidity. Left gaze pref. 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  History of seizures on Keppra 100 mg p.o. twice daily. Depression At baseline in [facility] since surgery  Full fxn on left. Able to stand and pivot but weak right leg. Contracted right arm. She is A&O at baseline but needs assist w/ ADLs including eating  Significant Hospital Events   12/7 admitted w/ sepsis and acute metabolic enceph w/ break thru seizures, PNA and possible meningitis started on  vanc, ceftriaxone and azith also decadron  12/8 LP - non concerning meningitis, d/c decadron and vanc  12/11 -trach cx - MSSA, switched to ancef 12/14- completed abx course 12/17- family meeting with palliative team, planning for extubation 12/19  Interim history/subjective:  Tachycardic overnight to 110s. Given 40 meq K  She tracks speaker and nods head appropriately to questions. Able to follow some commands. She is doing well on pressure support this morning.  Objective   Blood pressure 132/81, pulse (!) 103, temperature 98.8 F (37.1 C), temperature source Oral, resp. rate 13, weight 45.4 kg, SpO2 100%.    Vent Mode: PRVC FiO2 (%):  [40 %] 40 % Set Rate:  [12 bmp] 12 bmp Vt Set:  [360 mL] 360 mL PEEP:  [5 cmH20] 5 cmH20 Pressure Support:  [10 cmH20] 10 cmH20 Plateau Pressure:  [12 cmH20-16 cmH20] 16 cmH20   Intake/Output Summary (Last 24 hours) at 03/27/2023 0650 Last data filed at 03/27/2023 0500 Gross per 24 hour  Intake 1380 ml  Output 1680 ml  Net -300 ml   Filed Weights   03/25/23 0215 03/26/23 0710 03/27/23 0500  Weight: 47.5 kg 47 kg 45.4 kg   Output net -13 L  Examination: Gen: chronically ill appearing HEENT: ET tube in place, oropharynx moist LUNG: decreased at both bases, no crackles or wheezes appreciated CV: Regular, borderline tachycardic Abd: hypoactive bowel sounds, non-distended  Ext: moves left upper extremity, edema resolved in left upper extremity Mental Status: Patient opens eyes to voice, tracks speaker,  follows some commands with left hand squeeze Motor: Right upper extremity contracted, no purposeful movement  Labs   CBC: WBCs 18.5-> 18.5 Hgb 9.1->9.5  Basic Metabolic Panel: Na 136-> 141 Bicarb 36->30 BUN 38  CXR 12/16 Mild interstitial edema present bilaterally  Resolved Hospital Problem list   AKI Sepsis  Assessment & Plan:  62 y.o. year old female with  Acute respiratory failure secondary to MSSA  pneumonia Leukocytosis- improving She was intubated for airway protection.  Strep pneumo urine negative, RVP negative.  Blood cultures grew Staph capitis in 1 of 4, likely contaminant. Leukocytosis from 18.5 to 18.5 this morning, she has remained afebrile. She is doing well on pressure support trials during the day. Palliative met with family including Lita Mains, and 1 of her Daughters 12/17. They will continue to talk about decision if patient should be re-intubated if she does not do well with extubation. Planning for extubation on 12/19. Family requests some additional time to come to final decision.  -PRVC 8 cc per keg. -Appreciate palliative care support in this case. Question if goals of care in the case that she requires re-intubation. If this was needed she would be facing vent dependency and likely PEG tube placement -Completed 7 days of antibiotics 12/14 -Continue diuresis as tolerated, lasix 40 mg qd   Anemia-resolved Hemoglobin at 9.5 this morning.  Received 2 units 12/15 and increased from 5.4.  She also received 2 doses of Lasix 40 mg yesterday.  Hematocrit increased in similar fashion.  This points towards volume status/delusional component of anemia yesterday.  No obvious source of bleeding. Ok with restarting DVT ppx   -trend CBC -continue dvt ppx with heparin  Electrolyte derangements Malnutrition Continue tube feeding Trend BMP  Systolic and diastolic heart failure Echo 12/8 with EF of 45 to 50%, grade 2 diastolic dysfunction  Plan -Continue diuresis on 12/18 -Telemetry monitoring -Supportive care  Depression -Continue amantadine, Abilify and Pristiq   Best practice:  Diet/type: NPO, OG tube for feeds and meds DVT prophylaxis: Heparin Pressure ulcer(s): present on admission - wound care saw, see note GI prophylaxis: H2B Lines: CVC Foley:  Yes, needed Code Status:  DNR Last date of multidisciplinary goals of care discussion -12/18 family planning for  extubation 12/19.   Gracy Ehly M. Saanya Zieske, D.O.  Internal Medicine Resident, PGY-3 [facility]  Pager: [phone] 6:50 AM, 03/27/2023

## 2023-03-27 NOTE — Plan of Care (Signed)
  Problem: Education: Goal: Ability to describe self-care measures that may prevent or decrease complications (Diabetes Survival Skills Education) will improve Outcome: Not Progressing Goal: Individualized Educational Video(s) Outcome: Not Progressing   Problem: Coping: Goal: Ability to adjust to condition or change in health will improve Outcome: Not Progressing   Problem: Fluid Volume: Goal: Ability to maintain a balanced intake and output will improve Outcome: Not Progressing   Problem: Health Behavior/Discharge Planning: Goal: Ability to identify and utilize available resources and services will improve Outcome: Not Progressing Goal: Ability to manage health-related needs will improve Outcome: Not Progressing   Problem: Metabolic: Goal: Ability to maintain appropriate glucose levels will improve Outcome: Not Progressing   Problem: Nutritional: Goal: Maintenance of adequate nutrition will improve Outcome: Not Progressing Goal: Progress toward achieving an optimal weight will improve Outcome: Not Progressing   Problem: Skin Integrity: Goal: Risk for impaired skin integrity will decrease Outcome: Not Progressing   Problem: Tissue Perfusion: Goal: Adequacy of tissue perfusion will improve Outcome: Not Progressing   Problem: Activity: Goal: Ability to tolerate increased activity will improve Outcome: Not Progressing   Problem: Respiratory: Goal: Ability to maintain a clear airway and adequate ventilation will improve Outcome: Not Progressing   Problem: Role Relationship: Goal: Method of communication will improve Outcome: Not Progressing   Problem: Education: Goal: Knowledge of General Education information will improve Description: Including pain rating scale, medication(s)/side effects and non-pharmacologic comfort measures Outcome: Not Progressing   Problem: Health Behavior/Discharge Planning: Goal: Ability to manage health-related needs will  improve Outcome: Not Progressing   Problem: Clinical Measurements: Goal: Ability to maintain clinical measurements within normal limits will improve Outcome: Not Progressing Goal: Will remain free from infection Outcome: Not Progressing Goal: Diagnostic test results will improve Outcome: Not Progressing Goal: Respiratory complications will improve Outcome: Not Progressing Goal: Cardiovascular complication will be avoided Outcome: Not Progressing   Problem: Activity: Goal: Risk for activity intolerance will decrease Outcome: Not Progressing   Problem: Nutrition: Goal: Adequate nutrition will be maintained Outcome: Not Progressing   Problem: Coping: Goal: Level of anxiety will decrease Outcome: Not Progressing   Problem: Elimination: Goal: Will not experience complications related to bowel motility Outcome: Not Progressing Goal: Will not experience complications related to urinary retention Outcome: Not Progressing   Problem: Pain Management: Goal: General experience of comfort will improve Outcome: Not Progressing   Problem: Safety: Goal: Ability to remain free from injury will improve Outcome: Not Progressing   Problem: Skin Integrity: Goal: Risk for impaired skin integrity will decrease Outcome: Not Progressing

## 2023-03-27 NOTE — Progress Notes (Signed)
[facility] ADULT ICU REPLACEMENT PROTOCOL   The patient does apply for the [facility] Adult ICU Electrolyte Replacment Protocol based on the criteria listed below:   1.Exclusion criteria: TCTS, ECMO, Dialysis, and Myasthenia Gravis patients 2. Is GFR >/= 30 ml/min? Yes.    Patient's GFR today is >60 3. Is SCr </= 2? Yes.   Patient's SCr is 0.66 mg/dL 4. Did SCr increase >/= 0.5 in 24 hours? No. 5.Pt's weight >40kg  Yes.   6. Abnormal electrolyte(s): potassium 3.7  7. Electrolytes replaced per protocol 8.  Call MD STAT for K+ </= 2.5, Phos </= 1, or Mag </= 1 Physician:  protocol  Melvern Banker 03/27/2023 5:02 AM

## 2023-03-27 NOTE — Progress Notes (Signed)
     Referral previously received for [name] for goals of care discussion. Noted most recent palliative in-person assessment dated 12/17 at which time it was recommended to follow from a distance/chart check until 12/19 for possible extubation.  Chart reviewed for Recent provider notes, vitals, and labs and updates received from RN.   At this time patient appears stable, some tachycardia last night, tolerating vent trials today. No plan for in person follow-up at this today. Please contact the palliative medicine provider on service for any new/urgent needs that require our assistance with this patient. Otherwise, we will follow-up in person tomorrow for likely extubation to aggressive attempts for success and family decision on whether she should be reintubated if she doesn't do well (see consult noted from yesterday for details).  Thank you for your referral and allowing PMT to assist in [place] Oxendine Cowens's care.   Wynne Dust, NP Palliative Medicine Team Phone: [phone]  NO CHARGE

## 2023-03-28 ENCOUNTER — Inpatient Hospital Stay (HOSPITAL_COMMUNITY): Payer: Medicare Other

## 2023-03-28 LAB — CBC
HCT: 23.4 % — ABNORMAL LOW (ref 36.0–46.0)
Hemoglobin: 7.7 g/dL — ABNORMAL LOW (ref 12.0–15.0)
MCH: 28.5 pg (ref 26.0–34.0)
MCHC: 32.9 g/dL (ref 30.0–36.0)
MCV: 86.7 fL (ref 80.0–100.0)
Platelets: 303 10*3/uL (ref 150–400)
RBC: 2.7 MIL/uL — ABNORMAL LOW (ref 3.87–5.11)
RDW: 18.1 % — ABNORMAL HIGH (ref 11.5–15.5)
WBC: 21.9 10*3/uL — ABNORMAL HIGH (ref 4.0–10.5)
nRBC: 0.1 % (ref 0.0–0.2)

## 2023-03-28 LAB — BPAM RBC
Blood Product Expiration Date: 202412172359
Blood Product Expiration Date: 202412302359
Blood Product Expiration Date: 202412302359
Blood Product Expiration Date: 202412302359
ISSUE DATE / TIME: 202412150710
ISSUE DATE / TIME: 202412151026
Unit Type and Rh: 7300
Unit Type and Rh: 7300
Unit Type and Rh: 7300
Unit Type and Rh: 7300

## 2023-03-28 LAB — GLUCOSE, CAPILLARY
Glucose-Capillary: 177 mg/dL — ABNORMAL HIGH (ref 70–99)
Glucose-Capillary: 133 mg/dL — ABNORMAL HIGH (ref 70–99)
Glucose-Capillary: 170 mg/dL — ABNORMAL HIGH (ref 70–99)
Glucose-Capillary: 151 mg/dL — ABNORMAL HIGH (ref 70–99)
Glucose-Capillary: 151 mg/dL — ABNORMAL HIGH (ref 70–99)
Glucose-Capillary: 163 mg/dL — ABNORMAL HIGH (ref 70–99)

## 2023-03-28 LAB — PHOSPHORUS: Phosphorus: 3.8 mg/dL (ref 2.5–4.6)

## 2023-03-28 LAB — BASIC METABOLIC PANEL
Anion gap: 8 (ref 5–15)
BUN: 44 mg/dL — ABNORMAL HIGH (ref 6–20)
CO2: 33 mmol/L — ABNORMAL HIGH (ref 22–32)
Calcium: 7.8 mg/dL — ABNORMAL LOW (ref 8.9–10.3)
Chloride: 98 mmol/L (ref 98–111)
Creatinine, Ser: 0.68 mg/dL (ref 0.44–1.00)
GFR, Estimated: >60 mL/min (ref >60)
Glucose, Bld: 178 mg/dL — ABNORMAL HIGH (ref 70–99)
Potassium: 4.1 mmol/L (ref 3.5–5.1)
Sodium: 139 mmol/L (ref 135–145)

## 2023-03-28 LAB — TYPE AND SCREEN
ABO/RH(D): B POS
Antibody Screen: NEGATIVE
Unit division: 0
Unit division: 0
Unit division: 0
Unit division: 0

## 2023-03-28 LAB — URINALYSIS, ROUTINE W REFLEX MICROSCOPIC
Bilirubin Urine: NEGATIVE
Glucose, UA: NEGATIVE mg/dL
Hgb urine dipstick: NEGATIVE
Ketones, ur: NEGATIVE mg/dL
Nitrite: NEGATIVE
Protein, ur: NEGATIVE mg/dL
Specific Gravity, Urine: 1.018 (ref 1.005–1.030)
WBC, UA: >50 WBC/hpf (ref 0–5)
pH: 5 (ref 5.0–8.0)

## 2023-03-28 LAB — MAGNESIUM: Magnesium: 2.2 mg/dL (ref 1.7–2.4)

## 2023-03-28 MED ORDER — ORAL CARE MOUTH RINSE
15.0000 mL | OROMUCOSAL | Status: DC | PRN
  Administered 2023-03-31: 15 mL via OROMUCOSAL

## 2023-03-28 MED ORDER — FUROSEMIDE 10 MG/ML IJ SOLN
40.0000 mg | Freq: Once | INTRAMUSCULAR | Status: AC
  Administered 2023-03-28: 40 mg via INTRAVENOUS
  Filled 2023-03-28: qty 4

## 2023-03-28 MED ORDER — ORAL CARE MOUTH RINSE
15.0000 mL | OROMUCOSAL | Status: DC
  Administered 2023-03-28 – 2023-04-07 (×39): 15 mL via OROMUCOSAL

## 2023-03-28 MED ORDER — GLYCOPYRROLATE 0.2 MG/ML IJ SOLN
0.1000 mg | INTRAMUSCULAR | Status: AC | PRN
  Administered 2023-03-28 – 2023-04-03 (×3): 0.1 mg via INTRAVENOUS
  Filled 2023-03-28: qty 0.5
  Filled 2023-03-28: qty 1
  Filled 2023-03-28 (×2): qty 0.5

## 2023-03-28 NOTE — Progress Notes (Signed)
NAME:  Audrey Turner, MRN:  [id number], DOB:  [birth year], LOS: 12 ADMISSION DATE:  03/16/2023, CONSULTATION DATE:  12/07 REFERRING MD:  Monica Martinez, CHIEF COMPLAINT:  Acute encephalopathy   History of present illness   62 year old female who is w/c bound at baseline w/ RUE contracted and RLE weakness but oriented x3 after neurosurgery in 2007.  Lives w/ and cared for by brother [name] 5-7d ago was having some trouble swallowing.  Following that over the following days was having runny nose, sore throat and congestions. Her speech quality worsened her brother though laryngitis. Over last 2d PTA decreased PO intake, having more trouble swallowing, not able to take in all her Keppra over last 24 hrs PTA. On 12/7 was at home w/ brother. He thought he witnessed a seizure and called EMS. She received versed on way to ER.  On arrival to ER pt was unresponsive w/ left gaze pref.  Initial ER eval  CT and MRI brain negative for acute changes. Did show remote craniectomy w/ex vacuo dilatation of left lateral ventricle She was loaded w/ Keppra Initial labs: cbg 124, INR 1.6, Na 159, CO2 19, BUN 26, cr 1.53, Trop 145. Lactate 4.1 Neg COVID Blood cultures sent Antibiotics started.  On CCM arrival still not responsive. Rigid w/ possible nuchal rigidity. Left gaze pref. 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  History of seizures on Keppra 100 mg p.o. twice daily. Depression At baseline in [facility] since surgery  Full fxn on left. Able to stand and pivot but weak right leg. Contracted right arm. She is A&O at baseline but needs assist w/ ADLs including eating  Significant Hospital Events   12/7 admitted w/ sepsis and acute metabolic enceph w/ break thru seizures, PNA and possible meningitis started on  vanc, ceftriaxone and azith also decadron  12/8 LP - non concerning meningitis, d/c decadron and vanc  12/11 -trach cx - MSSA, switched to ancef 12/14- completed abx course 12/17- family meeting with palliative team, planning for extubation 12/19  Interim history/subjective:  Tachycardic 100-120s, febrile to 101.3 F.  Remains alert and follows commands this AM.  Objective   Blood pressure 115/79, pulse (!) 104, temperature 100.3 F (37.9 C), temperature source Oral, resp. rate 20, weight 45.4 kg, SpO2 100%.    Vent Mode: PRVC FiO2 (%):  [40 %] 40 % Set Rate:  [12 bmp] 12 bmp Vt Set:  [360 mL] 360 mL PEEP:  [5 cmH20] 5 cmH20 Pressure Support:  [10 cmH20] 10 cmH20 Plateau Pressure:  [15 cmH20-19 cmH20] 19 cmH20   Intake/Output Summary (Last 24 hours) at 03/28/2023 0555 Last data filed at 03/28/2023 0500 Gross per 24 hour  Intake 1450 ml  Output 1405 ml  Net 45 ml   Filed Weights   03/25/23 0215 03/26/23 0710 03/27/23 0500  Weight: 47.5 kg 47 kg 45.4 kg   Output net -13 L  Examination: Gen: chronically ill appearing HEENT: ET tube in place, oropharynx moist LUNG: decreased at both bases, no crackles or wheezes appreciated CV: Regular, borderline tachycardic Abd: hypoactive bowel sounds, non-distended  Ext: moves left upper extremity, edema resolved in left upper extremity Mental Status: Patient opens eyes to voice, tracks speaker, follows some commands with left hand squeeze Motor: Right upper extremity contracted, no purposeful movement  Labs  CBC: WBCs 18.5-> 21.9 Hgb 9.5->7.7 Hct 28-> 23  Basic Metabolic Panel: Na 141-> 139 Bicarb 33->33 BUN 44  CXR 12/19 Mild interstitial edema present bilaterally  Blood cx 12/10 ngtd  Resolved Hospital Problem list   AKI Sepsis  Assessment & Plan:  62 y.o. year old female with  Acute respiratory failure secondary to MSSA pneumonia From a respiratory standpoint, she has done well on pressure support settings  over the last few days. She completed abx treatment for MSSA pneumonia 12/14. CXR 12/19 without consolidation.  Palliative met with family including Lita Mains, and 1 of her Daughters 12/17. They will continue to talk about decision if patient should be re-intubated if she does not do well with extubation. Planning for extubation on 12/19. Goal is to clarify with family if they would like her to be reintubated if she does not do well.   -PRVC 8 cc per keg. -Appreciate palliative care support in this case. Question if goals of care in the case that she requires re-intubation. If this was needed she would be facing vent dependency and likely PEG tube placement -Completed 7 days of antibiotics 12/14 -Continue diuresis as tolerated, lasix 40 mg qd   Leukocytosis WBC has been trending up over the last few days, today at 21. She was febrile overnight. CXR 12/19 clear with no consolidation.   -repeat UA and blood cultures -trend CBC  Anemia Hemoglobin at 7.7 from  9.5 this morning.  Received 2 units 12/15 and increased from 5.4.  No signs of external bleeding. Will continue to monitor.  -trend CBC -continue dvt ppx with heparin  Electrolyte derangements Malnutrition Continue tube feeding Trend BMP  Systolic and diastolic heart failure Echo 12/8 with EF of 45 to 50%, grade 2 diastolic dysfunction  Plan -Continue diuresis on 12/19 -Telemetry monitoring -Supportive care  Depression -Continue amantadine, Abilify and Pristiq   Best practice:  Diet/type: NPO, OG tube for feeds and meds DVT prophylaxis: Heparin Pressure ulcer(s): present on admission - wound care saw, see note GI prophylaxis: H2B Lines: CVC Foley:  Yes, needed Code Status:  DNR Last date of multidisciplinary goals of care discussion -12/18 family planning for extubation 12/19.   Deantre Bourdon M. Odyssey Vasbinder, D.O.  Internal Medicine Resident, PGY-3 [facility]  Pager: [phone] 5:55 AM,  03/28/2023

## 2023-03-28 NOTE — Procedures (Signed)
Extubation Procedure Note  Patient Details:   Name: Audrey Turner DOB: June 28, 1960 MRN: [id number]   Airway Documentation:    Vent end date: 03/28/23 Vent end time: 1440   Evaluation  O2 sats: stable throughout Complications: No apparent complications Patient did tolerate procedure well. Bilateral Breath Sounds: Clear, Diminished   Yes  Pt extubated per physician order. Pt suctioned via ETT/orally prior, positive cuff leak. Upon extubation, pt unable to talk to staff, congested, weak cough heard and no stridor. RT, Dr. Jane Canary NP- Palliative and RN at bedside during extubation procedure. RT will continue to be available as needed.   [name] 03/28/2023, 2:47 PM

## 2023-03-28 NOTE — Progress Notes (Signed)
Patient with an increase in thick secretions. Patient has a cough but unable to get up all secretions on her own. Elink notified of secretions and respirations ranging from 40-45 breaths/min. Oxygen saturations are 100% on 3L [place] at this time. Awaiting new orders.

## 2023-03-28 NOTE — Progress Notes (Signed)
Daily Progress Note   Patient Name: Audrey Turner       Date: 03/28/2023 DOB: 07-Jul-1960  Age: 62 y.o. MRN#: [id number] Attending Physician: Leslye Peer, MD Primary Care Physician: Joaquim Nam, MD Admit Date: 03/16/2023 Length of Stay: 12 days  Reason for Consultation/Follow-up: Establishing goals of care  HPI/Patient Profile:  62 y.o. female  with past medical history of astrocytoma s/p chemo, radiation, and multiple resections (2007), seizures, and depression admitted on 03/16/2023 with altered mental status.    Acute metabolic encephalopathy felt to be secondary to sepsis from pneumonia and status epilepticus. She was intubated for airway protection. She has now been off sedation for several days with limited improvement in mental status.   Patient faces treatment option decisions, advanced directive decisions and anticipatory care needs.  Subjective:   Subjective: Chart Reviewed. Updates received. Patient Assessed. Created space and opportunity for patient  and family to explore thoughts and feelings regarding current medical situation.  Today's Discussion: Today saw the patient at bedside, she remains intubated.  Approximately 15 family members came for an update and make a decision on extubation and whether she would want to be reintubated.  I spent some time along with Dr. Rudene Christians, MD with the resident team updating the family.  It was essentially a we discussion and we explaining the conversation from to get to go.  We feel that she will likely do good, however if she does not need to be reintubated then it would likely mean prolonged artificial life support.  We stated that there needs to be decision on whether she would want to be reintubated if it would mean likely long-term or forever life support.  Patient's daughters excuse himself from separate room to discuss.  We met with him sometime later and they decided to proceed with the extubation, understanding  that if for some reason she struggled that we could use all interventions including noninvasive positive pressure ventilation (BiPAP) but DO NOT reintubate.  After that I was present for the patient's extubation.  She tolerated well initially with a good cough and suctioning to remove secretions.  When I left the room she was satting 96% on 6 L per nasal cannula.  I told the team that I would follow-up tomorrow to check on her progress and for any ongoing and further discussions needed.  I shared that I would shadow the chart tomorrow and plan to be present on Thursday around the time of extubation. I provided emotional and general support through therapeutic listening, empathy, sharing of stories, and other techniques. I answered all questions and addressed all concerns to the best of my ability.  Review of Systems  Unable to perform ROS: Intubated    Objective:   Vital Signs:  BP 112/71 (BP Location: Left Wrist)   Pulse (!) 105   Temp 98.6 F (37 C) (Axillary)   Resp (!) 23   Wt 45.4 kg   SpO2 100%   BMI 19.55 kg/m   Physical Exam Vitals and nursing note reviewed.  Constitutional:      General: She is not in acute distress.    Appearance: She is ill-appearing.     Interventions: She is intubated.  HENT:     Head: Normocephalic and atraumatic.  Pulmonary:     Effort: Pulmonary effort is normal. No respiratory distress. She is intubated.  Neurological:     Mental Status: She is alert.     Palliative Assessment/Data: 30%    Existing  Vynca/ACP Documentation: None  Assessment & Plan:   Impression: Present on Admission:  Sepsis (HCC)  SUMMARY OF RECOMMENDATIONS   Changed to DNR-limited DO NOT reintubate Continue current scope of care and all medical interventions Continued emotional support of patient and family Further GOC conversations if/as necessary Palliative medicine will follow-up tomorrow  Symptom Management:  Per primary team PMT is available to  assist as needed  Code Status: DNR-limited  Prognosis: Unable to determine  Discharge Planning: To Be Determined  Discussed with: [name], medical team, nursing team  Thank you for allowing [place] to participate in the care of Audrey Turner PMT will continue to support holistically.  Time Total: 60 min  Detailed review of medical records (labs, imaging, vital signs), medically appropriate exam, discussed with treatment team, counseling and education to patient, family, & staff, documenting clinical information, medication management, coordination of care  Wynne Dust, NP Palliative Medicine Team  Team Phone # [phone] (Nights/Weekends)  12/06/2020, 8:17 AM

## 2023-03-28 NOTE — Progress Notes (Signed)
Patient's bilateral lower extremities appearing slightly mottled. Feet still warm, with faintly palpable dorsalis pedis pulses (+1), consistent with previous assessments. Dorsalis pedis pulses also marked using doppler to confirm. Patient still responding to pain in bilateral lower extremities, consistent with previous assessments.

## 2023-03-28 NOTE — Progress Notes (Signed)
Palliative team and ICU team met with family again today. Plans for extubation this afternoon. Her 3 daughters were present and elected to not pursue re-intubation if she were not to do well following extubation. They do want support up to BiPAP to be used.  P: Appreciate palliative help in this case DNR/DNR updated Extubation orders placed

## 2023-03-28 NOTE — Progress Notes (Signed)
Pt NTS x1 with moderate thick, tan secretions. Pt tolerated well. RN at bedside. RT available as needed.

## 2023-03-29 ENCOUNTER — Inpatient Hospital Stay (HOSPITAL_COMMUNITY): Payer: Medicare Other

## 2023-03-29 LAB — CBC WITH DIFFERENTIAL/PLATELET
Abs Immature Granulocytes: 0.37 10*3/uL — ABNORMAL HIGH (ref 0.00–0.07)
Basophils Absolute: 0.1 10*3/uL (ref 0.0–0.1)
Basophils Relative: 0 %
Eosinophils Absolute: 0 10*3/uL (ref 0.0–0.5)
Eosinophils Relative: 0 %
HCT: 18.4 % — ABNORMAL LOW (ref 36.0–46.0)
Hemoglobin: 6.1 g/dL — CL (ref 12.0–15.0)
Immature Granulocytes: 2 %
Lymphocytes Relative: 10 %
Lymphs Abs: 2.1 10*3/uL (ref 0.7–4.0)
MCH: 28.9 pg (ref 26.0–34.0)
MCHC: 33.2 g/dL (ref 30.0–36.0)
MCV: 87.2 fL (ref 80.0–100.0)
Monocytes Absolute: 0.8 10*3/uL (ref 0.1–1.0)
Monocytes Relative: 4 %
Neutro Abs: 18.1 10*3/uL — ABNORMAL HIGH (ref 1.7–7.7)
Neutrophils Relative %: 84 %
Platelets: 344 10*3/uL (ref 150–400)
RBC: 2.11 MIL/uL — ABNORMAL LOW (ref 3.87–5.11)
RDW: 18.3 % — ABNORMAL HIGH (ref 11.5–15.5)
WBC: 21.5 10*3/uL — ABNORMAL HIGH (ref 4.0–10.5)
nRBC: 0.1 % (ref 0.0–0.2)

## 2023-03-29 LAB — BASIC METABOLIC PANEL
Anion gap: 11 (ref 5–15)
BUN: 61 mg/dL — ABNORMAL HIGH (ref 6–20)
CO2: 28 mmol/L (ref 22–32)
Calcium: 7.7 mg/dL — ABNORMAL LOW (ref 8.9–10.3)
Chloride: 100 mmol/L (ref 98–111)
Creatinine, Ser: 1 mg/dL (ref 0.44–1.00)
GFR, Estimated: >60 mL/min (ref >60)
Glucose, Bld: 230 mg/dL — ABNORMAL HIGH (ref 70–99)
Potassium: 4.6 mmol/L (ref 3.5–5.1)
Sodium: 139 mmol/L (ref 135–145)

## 2023-03-29 LAB — TECHNOLOGIST SMEAR REVIEW

## 2023-03-29 LAB — GLUCOSE, CAPILLARY
Glucose-Capillary: 116 mg/dL — ABNORMAL HIGH (ref 70–99)
Glucose-Capillary: 121 mg/dL — ABNORMAL HIGH (ref 70–99)
Glucose-Capillary: 123 mg/dL — ABNORMAL HIGH (ref 70–99)
Glucose-Capillary: 157 mg/dL — ABNORMAL HIGH (ref 70–99)
Glucose-Capillary: 170 mg/dL — ABNORMAL HIGH (ref 70–99)
Glucose-Capillary: 233 mg/dL — ABNORMAL HIGH (ref 70–99)

## 2023-03-29 LAB — PHOSPHORUS: Phosphorus: 5 mg/dL — ABNORMAL HIGH (ref 2.5–4.6)

## 2023-03-29 LAB — CBC
HCT: 18.7 % — ABNORMAL LOW (ref 36.0–46.0)
Hemoglobin: 6.1 g/dL — CL (ref 12.0–15.0)
MCH: 28.4 pg (ref 26.0–34.0)
MCHC: 32.6 g/dL (ref 30.0–36.0)
MCV: 87 fL (ref 80.0–100.0)
Platelets: 345 10*3/uL (ref 150–400)
RBC: 2.15 MIL/uL — ABNORMAL LOW (ref 3.87–5.11)
RDW: 18.1 % — ABNORMAL HIGH (ref 11.5–15.5)
WBC: 22 10*3/uL — ABNORMAL HIGH (ref 4.0–10.5)
nRBC: 0 % (ref 0.0–0.2)

## 2023-03-29 LAB — PREPARE RBC (CROSSMATCH)

## 2023-03-29 LAB — MAGNESIUM: Magnesium: 2.3 mg/dL (ref 1.7–2.4)

## 2023-03-29 MED ORDER — SODIUM CHLORIDE 0.9% IV SOLUTION: Freq: Once | INTRAVENOUS | Status: DC

## 2023-03-29 NOTE — Progress Notes (Signed)
OT Cancellation Note  Patient Details Name: Audrey Turner MRN: [id number] DOB: [birth year]   Cancelled Treatment:    Reason Eval/Treat Not Completed: Patient not medically ready, declining respiratory status and mentation.   Evern Bio 03/29/2023, 11:38 AM Berna Spare, OTR/L Acute Rehabilitation Services Office: [phone]

## 2023-03-29 NOTE — Progress Notes (Addendum)
PT Cancellation Note  Patient Details Name: Jee Soppe MRN: [id number] DOB: 1960-05-23   Cancelled Treatment:    Reason Eval/Treat Not Completed: Patient not medically ready Tenuous respiratory status. Will continue to follow. Plan for comprehensive PT evaluation tomorrow, as able.   Kathlyn Sacramento, PT, DPT [facility]  Rehabilitation Services Physical Therapist Office: [phone] Website: [email or web address]   Berton Mount 03/29/2023, 12:32 PM

## 2023-03-29 NOTE — Progress Notes (Signed)
   03/29/23 0053  BiPAP/CPAP/SIPAP  $ Non-Invasive Ventilator  Non-Invasive Vent Initial  $ Face Mask Small Yes  BiPAP/CPAP/SIPAP Pt Type Adult  BiPAP/CPAP/SIPAP SERVO  Mask Type Full face mask  Mask Size Small  Respiratory Rate 12 breaths/min  IPAP 15 cmH20  EPAP 5 cmH2O  PEEP 5 cmH20  FiO2 (%) 40 %  Minute Ventilation 10.9  Peak Inspiratory Pressure (PIP) 17  Tidal Volume (Vt) 399  Patient Home Equipment No  Auto Titrate No  Press High Alarm 25 cmH2O

## 2023-03-29 NOTE — Progress Notes (Addendum)
Patient's brother Audrey Turner, point of contact, called and updated on patients respiratory status and waning mentation. Patient currently on bipap, opens eyes briefly to voice, not following commands. Now having trouble picking up oxygen saturations with pulse ox. Elink notified of all concerns. Brother en route to hospital.   [phone]: Discussed with Dr. Warrick Parisian. Arterial blood gas ordered. Respiratory notified.

## 2023-03-29 NOTE — Plan of Care (Signed)
  Problem: Nutritional: Goal: Maintenance of adequate nutrition will improve Outcome: Progressing   

## 2023-03-29 NOTE — Progress Notes (Signed)
Received patient off bipap, regular unlabored respirations ranging from 26-30 breaths/min, oxygen saturations 100% on room air, HR in 90s. Family at bedside. Care plan discussed and confirmed with family. As noted by palliative care, should patient show signs of decompensation, bipap to be placed back on and family to be called in. All questions from family answered at this time.

## 2023-03-29 NOTE — Progress Notes (Signed)
NAME:  Audrey Turner, MRN:  [id number], DOB:  1960-12-06, LOS: 13 ADMISSION DATE:  03/16/2023, CONSULTATION DATE:  12/07 REFERRING MD:  Monica Martinez, CHIEF COMPLAINT:  Acute encephalopathy   History of present illness   62 year old female who is w/c bound at baseline w/ RUE contracted and RLE weakness but oriented x3 after neurosurgery in 2007.  Lives w/ and cared for by brother Audrey Turner 5-7d ago was having some trouble swallowing.  Following that over the following days was having runny nose, sore throat and congestions. Her speech quality worsened her brother though laryngitis. Over last 2d PTA decreased PO intake, having more trouble swallowing, not able to take in all her Keppra over last 24 hrs PTA. On 12/7 was at home w/ brother. He thought he witnessed a seizure and called EMS. She received versed on way to ER.  On arrival to ER pt was unresponsive w/ left gaze pref.  Initial ER eval  CT and MRI brain negative for acute changes. Did show remote craniectomy w/ex vacuo dilatation of left lateral ventricle She was loaded w/ Keppra Initial labs: cbg 124, INR 1.6, Na 159, CO2 19, BUN 26, cr 1.53, Trop 145. Lactate 4.1 Neg COVID Blood cultures sent Antibiotics started.  On CCM arrival still not responsive. Rigid w/ possible nuchal rigidity. Left gaze pref. 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  History of seizures on Keppra 100 mg p.o. twice daily. Depression At baseline in [facility] since surgery  Full fxn on left. Able to stand and pivot but weak right leg. Contracted right arm. She is A&O at baseline but needs assist w/ ADLs including eating  Significant Hospital Events   12/7 admitted w/ sepsis and acute metabolic enceph w/ break thru seizures, PNA and possible meningitis started on  vanc, ceftriaxone and azith also decadron  12/8 LP - non concerning meningitis, d/c decadron and vanc  12/11 -trach cx - MSSA, switched to ancef 12/14- completed abx course 12/17- family meeting with palliative team, planning for extubation 12/19 12/19- extubated  Interim history/subjective:  Tachycardic 100-120s, febrile to 100.5 F. Hypertensive requiring multiple labetolol pushes. Placed on BiPAP with increased work of breathing around 0200 12/20. Hgb dropped form 7.7 to 6.1, given 1 unit PRBCs.  Objective   Blood pressure 95/64, pulse (!) 114, temperature (!) 100.5 F (38.1 C), temperature source Axillary, resp. rate (!) 34, weight 45.4 kg, SpO2 100%.    Vent Mode: PRVC FiO2 (%):  [30 %-40 %] 40 % Set Rate:  [12 bmp] 12 bmp Vt Set:  [360 mL] 360 mL PEEP:  [5 cmH20] 5 cmH20 Plateau Pressure:  [15 cmH20-16 cmH20] 15 cmH20   Intake/Output Summary (Last 24 hours) at 03/29/2023 0559 Last data filed at 03/29/2023 9937 Gross per 24 hour  Intake 1380 ml  Output 700 ml  Net 680 ml   Filed Weights   03/25/23 0215 03/26/23 0710 03/27/23 0500  Weight: 47.5 kg 47 kg 45.4 kg   Net even  Examination: Gen: chronically ill appearing HEENT: ET tube in place, oropharynx moist LUNG: decreased at both bases, no crackles or wheezes appreciated CV: Regular, borderline tachycardic Abd: hypoactive bowel sounds, distended  Ext: moves left upper extremity, edema resolved in left upper extremity Mental Status: Patient opens eyes to voice, tracks speaker Motor: Right upper extremity  contracted, no purposeful movement  Labs   CBC: WBCs 21.9> 22 Hgb 7.7->6.1 Hct 28-> 23  Basic Metabolic Panel: Na 139-> 139 Bicarb 33->28 BUN 61 Creatinine from 0.6 to 1  CXR 12/19 Mild interstitial edema present bilaterally  Blood cx 12/10 ngtd, 12/19 ngtd  Resolved Hospital Problem list   AKI Sepsis  Assessment & Plan:  62 y.o. year old female with  Acute respiratory failure  MSSA  pneumonia-treated abx ended 12/14 She was extubated yesterday afternoon.  Overnight she has developed increased work of breathing with thick secretions and was placed on BiPAP.  She has had an increasing leukocytosis and was febrile yesterday.  Chest x-ray 12/19 showed no consolidation or interstitial edema.  Audrey Turner is at bedside and her other brother Audrey Turner is coming as well. Will try periods off Bipap but if she is unable to tolerate this then will transition to comfort care.  -Repeat chest x-ray this morning clear -BiPAP as tolerated  Leukocytosis White blood cell count from 21-22 today.  She was febrile overnight as well.  Blood cultures were recollected yesterday with no growth to date.  UA was negative for nitrites positive for leukocytes.    -repeat UA and blood cultures -trend CBC  Anemia Received 2 units 12/15 and increased from 5.4.  No signs of external bleeding.  Hemoglobin dropped again from 7.7-6.1.  She is being transfused 1 unit PRBCs today.  No external signs of blood again with this drop.  I do question if this could be hemolytic anemia  -LDH, Tech smear -Post H&H -continue dvt ppx with heparin  Electrolyte derangements Malnutrition Continue tube feeding Trend BMP  Systolic and diastolic heart failure Echo 12/8 with EF of 45 to 50%, grade 2 diastolic dysfunction Creatinine increased from baseline around 0.6 to 0.7-1 today.  I am holding diuresis.  Plan -Hold diuresis 12/20 -Telemetry monitoring -Supportive care  Depression -Continue amantadine, Abilify and Pristiq   Best practice:  Diet/type: NPO, holding  feeds with bipap DVT prophylaxis: Heparin Pressure ulcer(s): present on admission - wound care saw, see note GI prophylaxis: H2B Lines: CVC Foley:  Yes, needed Code Status:  DNR/DNI Last date of multidisciplinary goals of care discussion -trials off bipap, if does not tolerate then transition to comfort  Audrey Turner M. Lareta Bruneau, D.O.  Internal Medicine  Resident, PGY-3 [facility]  Pager: [phone] 5:59 AM, 03/29/2023

## 2023-03-29 NOTE — Progress Notes (Signed)
Daily Progress Note   Patient Name: Audrey Turner       Date: 03/29/2023 DOB: 17-Jul-1960  Age: 62 y.o. MRN#: [id number] Attending Physician: Charlott Holler, MD Primary Care Physician: Joaquim Nam, MD Admit Date: 03/16/2023 Length of Stay: 13 days  Reason for Consultation/Follow-up: Establishing goals of care  HPI/Patient Profile:  62 y.o. female  with past medical history of astrocytoma s/p chemo, radiation, and multiple resections (2007), seizures, and depression admitted on 03/16/2023 with altered mental status.    Acute metabolic encephalopathy felt to be secondary to sepsis from pneumonia and status epilepticus. She was intubated for airway protection. She has now been off sedation for several days with limited improvement in mental status.   Patient faces treatment option decisions, advanced directive decisions and anticipatory care needs.  Subjective:   Subjective: Chart Reviewed. Updates received. Patient Assessed. Created space and opportunity for patient  and family to explore thoughts and feelings regarding current medical situation.  Today's Discussion: Today for seeing the patient I spoke with Dr. Rudene Christians with the resident team.  Overnight she had some tachypnea and respiratory struggle so she was put on BiPAP.  She has been on BiPAP continuously since 2:00 in the morning.  She remains tachypneic around a respiratory rate of 35 but saturations are good.  Team is planning to offer a trial off BiPAP to see how she does, and if does not do well then would recommend a shift to comfort care.    At this point we went to meet with the patient's family including 2 of her brothers, daughter, and a nephew.  Explained the current clinical situation.  Her lungs are aerating well and her saturations are good, however she is very weak and fatigued which is likely why she is breathing at 35 breaths/min.  Our concern is that this is not sustainable in the long run and she  will tire out and have significant respiratory decline.  We discussed considering trial off BiPAP to see how she does.  If she does well then we would continue to support her.  However, if she does not do well and especially if she begins to appear to suffer then we would recommend a transition to comfort care.  Family has requested going back on BiPAP if she does not do well to allow them time for other family members to come and see her.  All are in agreement with this plan.  I saw the patient at the bedside, she seems quite sleepy.  Respiratory came and removed the BiPAP.  Later in the day I returned and the patient was surrounded by family.  She is currently on room air and satting 100%.  However, she remains tachypneic at 35 to 40 breaths/min.  She does not appear to be uncomfortable, no increased work of breathing, no disadvantageous respiratory sounds.  Family seems happy with how she looks.  We discussed if she decompensates and reapplying the BiPAP to allow time with the plan to transition to comfort.  Family is clear that they do not want her to suffer.  I shared that I would follow-up tomorrow to check on the patient and family to see how they are doing.  I shared the palliative medicine will continue to support them during this illness journey.  I provided emotional and general support through therapeutic listening, empathy, sharing of stories, and other techniques. I answered all questions and addressed all concerns to the best of my ability.  Review  of Systems  Unable to perform ROS: Intubated    Objective:   Vital Signs:  BP 126/85   Pulse (!) 103   Temp (!) 100.5 F (38.1 C) (Axillary)   Resp (!) 32   Wt 48 kg   SpO2 100%   BMI 20.67 kg/m   Physical Exam Vitals and nursing note reviewed.  Constitutional:      General: She is not in acute distress.    Appearance: She is ill-appearing.  HENT:     Head: Normocephalic and atraumatic.  Pulmonary:     Effort: Pulmonary  effort is normal. Tachypnea present. No respiratory distress.  Neurological:     Mental Status: She is alert.     Palliative Assessment/Data: 30%    Existing Vynca/ACP Documentation: None  Assessment & Plan:   Impression: Present on Admission:  Sepsis (HCC)  SUMMARY OF RECOMMENDATIONS   DNR-limited DO NOT reintubate Trial off BiPAP If the patient does not do well we will reapply BiPAP to allow time for family to come visit with and anticipation of transition to comfort Take it a day at a time Continued emotional support of patient and family Palliative medicine will follow-up tomorrow  Symptom Management:  Per primary team PMT is available to assist as needed  Code Status: DNR-limited  Prognosis: Unable to determine  Discharge Planning: To Be Determined  Discussed with: [name], medical team, nursing team  Thank you for allowing [place] to participate in the care of [name] PMT will continue to support holistically.  Time Total: 60 min  Detailed review of medical records (labs, imaging, vital signs), medically appropriate exam, discussed with treatment team, counseling and education to patient, family, & staff, documenting clinical information, medication management, coordination of care  Wynne Dust, NP Palliative Medicine Team  Team Phone # [phone] (Nights/Weekends)  12/06/2020, 8:17 AM

## 2023-03-30 ENCOUNTER — Encounter (HOSPITAL_COMMUNITY): Payer: Self-pay | Admitting: Pulmonary Disease

## 2023-03-30 DIAGNOSIS — D72829 Elevated white blood cell count, unspecified: Secondary | ICD-10-CM

## 2023-03-30 DIAGNOSIS — Z8619 Personal history of other infectious and parasitic diseases: Secondary | ICD-10-CM

## 2023-03-30 DIAGNOSIS — F32A Depression, unspecified: Secondary | ICD-10-CM

## 2023-03-30 DIAGNOSIS — E46 Unspecified protein-calorie malnutrition: Secondary | ICD-10-CM

## 2023-03-30 DIAGNOSIS — I5040 Unspecified combined systolic (congestive) and diastolic (congestive) heart failure: Secondary | ICD-10-CM

## 2023-03-30 DIAGNOSIS — D649 Anemia, unspecified: Secondary | ICD-10-CM

## 2023-03-30 LAB — GLUCOSE, CAPILLARY
Glucose-Capillary: 115 mg/dL — ABNORMAL HIGH (ref 70–99)
Glucose-Capillary: 143 mg/dL — ABNORMAL HIGH (ref 70–99)
Glucose-Capillary: 131 mg/dL — ABNORMAL HIGH (ref 70–99)
Glucose-Capillary: 142 mg/dL — ABNORMAL HIGH (ref 70–99)
Glucose-Capillary: 78 mg/dL (ref 70–99)

## 2023-03-30 LAB — CBC
HCT: 20 % — ABNORMAL LOW (ref 36.0–46.0)
Hemoglobin: 6.7 g/dL — CL (ref 12.0–15.0)
MCH: 29.1 pg (ref 26.0–34.0)
MCHC: 33.5 g/dL (ref 30.0–36.0)
MCV: 87 fL (ref 80.0–100.0)
Platelets: 313 10*3/uL (ref 150–400)
RBC: 2.3 MIL/uL — ABNORMAL LOW (ref 3.87–5.11)
RDW: 16.7 % — ABNORMAL HIGH (ref 11.5–15.5)
WBC: 20.2 10*3/uL — ABNORMAL HIGH (ref 4.0–10.5)
nRBC: 0.5 % — ABNORMAL HIGH (ref 0.0–0.2)

## 2023-03-30 LAB — BASIC METABOLIC PANEL
Anion gap: 8 (ref 5–15)
BUN: 68 mg/dL — ABNORMAL HIGH (ref 6–20)
CO2: 33 mmol/L — ABNORMAL HIGH (ref 22–32)
Calcium: 7.8 mg/dL — ABNORMAL LOW (ref 8.9–10.3)
Chloride: 101 mmol/L (ref 98–111)
Creatinine, Ser: 0.94 mg/dL (ref 0.44–1.00)
GFR, Estimated: >60 mL/min (ref >60)
Glucose, Bld: 165 mg/dL — ABNORMAL HIGH (ref 70–99)
Potassium: 4.3 mmol/L (ref 3.5–5.1)
Sodium: 142 mmol/L (ref 135–145)

## 2023-03-30 LAB — HEMOGLOBIN AND HEMATOCRIT, BLOOD
HCT: 23.7 % — ABNORMAL LOW (ref 36.0–46.0)
Hemoglobin: 8.4 g/dL — ABNORMAL LOW (ref 12.0–15.0)

## 2023-03-30 LAB — PREPARE RBC (CROSSMATCH)

## 2023-03-30 LAB — MAGNESIUM: Magnesium: 2.5 mg/dL — ABNORMAL HIGH (ref 1.7–2.4)

## 2023-03-30 LAB — PHOSPHORUS: Phosphorus: 4.2 mg/dL (ref 2.5–4.6)

## 2023-03-30 MED ORDER — SODIUM CHLORIDE 0.9% IV SOLUTION: Freq: Once | INTRAVENOUS | Status: DC

## 2023-03-30 NOTE — Evaluation (Signed)
Occupational Therapy Evaluation Patient Details Name: Audrey Turner MRN: [id number] DOB: [birth year] Today's Date: 03/30/2023   History of Present Illness 62 y.o. female admitted 12/7 with altered mental status, sepsis, metabolic encephalopathy, breakthrough seizures and Intubated. 12/19 extubated, weaned off BIPAP. With past medical history of astrocytoma s/p chemo, radiation, and multiple resections (2007), seizures, and depression   Clinical Impression   PTA, pt lived with brother and per brother, he assisted with all ADL and transfers to/from wheelchair. Upon eval, pt with LUE decreased initiation, edema, redness, decreased communication as compared to baseline, and decreased cardiopulmonary status. Pt needing total A for ADL at time of eval and with slowed initiation of all movement. Brother provides PLOF today and serves as her full time caregiver. Pt needing total A for sitting balance as well. Will continue to follow to optimize pt function and decr burden of care.       If plan is discharge home, recommend the following: Two people to help with walking and/or transfers;Two people to help with bathing/dressing/bathroom;Assistance with cooking/housework;Assistance with feeding;Direct supervision/assist for medications management;Direct supervision/assist for financial management;Assist for transportation;Help with stairs or ramp for entrance    Functional Status Assessment  Patient has had a recent decline in their functional status and demonstrates the ability to make significant improvements in function in a reasonable and predictable amount of time.  Equipment Recommendations  None recommended by OT    Recommendations for Other Services Speech consult     Precautions / Restrictions Precautions Precautions: Fall Precaution Comments: Monitor vitals closely, esp RR and sats Restrictions Weight Bearing Restrictions Per Provider Order: No      Mobility Bed  Mobility Overal bed mobility: Needs Assistance             General bed mobility comments: Bed placed into chair setting with HOB elevated to 90 degrees and LEs dependent. Vitals remained stable throughout session.    Transfers                   General transfer comment: deferred      Balance Overall balance assessment: Needs assistance Sitting-balance support: Bilateral upper extremity supported Sitting balance-Leahy Scale: Zero Sitting balance - Comments: Bed placed into chair position, assisted by therapists to bring trunk away from bed. She was able to facilitate forward head movement, and pull through LUE holding therapist's hand but could not control trunk today. She did an excellent job with head control. Able to perform short range rotation and look up and down gently.                                   ADL either performed or assessed with clinical judgement   ADL Overall ADL's : Needs assistance/impaired                                       General ADL Comments: total A at time of eval. Hand over hand for washing face     Vision Ability to See in Adequate Light: 0 Adequate Additional Comments: increased time for visual scanning on command . Slight preference for L gaze.     Perception         Praxis         Pertinent Vitals/Pain       Extremity/Trunk Assessment Upper Extremity Assessment Upper Extremity  Assessment: Right hand dominant;RUE deficits/detail;LUE deficits/detail RUE Deficits / Details: R hand contracted at baseline with preference for elbow flexion. Able to provide PROM to digits but lacking significant amount of extension. At rest elbow 110 degrees flexion able to progress to only 45 degrees. Repositioned in bed at ~60 degrees. LUE Deficits / Details: 3+ grip strength but unable to make full fist secondary to edema. 2+/5 strength elbow. extension preference wrist. shoulder flexion only observed to 10  degrees today.   Lower Extremity Assessment Lower Extremity Assessment: Defer to PT evaluation       Communication Communication Communication: Difficulty communicating thoughts/reduced clarity of speech Cueing Techniques: Verbal cues;Tactile cues;Gestural cues;Visual cues   Cognition                                             General Comments  SpO2 100% on RA. HR 96, RR 25-29 during session. BP 119/62.    Exercises     Shoulder Instructions      Home Living Family/patient expects to be discharged to:: Private residence Living Arrangements: Other (Comment) (brother) Available Help at Discharge: Family;Available 24 hours/day Type of Home: House Home Access: Ramped entrance (threshold into the door)     Home Layout: One level     Bathroom Shower/Tub: Walk-in shower (at [employer] accessible shower. sponge baths at home)         Home Equipment: BSC/3in1;Rolling Walker (2 wheels);Shower seat;Wheelchair - manual (full sized electric/adjustable bed.)   Additional Comments: stays at mothers ~60% of time      Prior Functioning/Environment Prior Level of Function : Needs assist       Physical Assist : Mobility (physical);ADLs (physical) Mobility (physical): Transfers   Mobility Comments: Brother assists with step pivot transfer, she holds RW.          OT Problem List: Decreased strength;Decreased activity tolerance;Impaired balance (sitting and/or standing);Decreased safety awareness;Cardiopulmonary status limiting activity;Impaired sensation      OT Treatment/Interventions: Self-care/ADL training;Therapeutic exercise;DME and/or AE instruction;Balance training;Patient/family education;Therapeutic activities    OT Goals(Current goals can be found in the care plan section) Acute Rehab OT Goals Patient Stated Goal: unable OT Goal Formulation: With patient/family Time For Goal Achievement: 03/30/23 Potential to Achieve Goals: Fair ADL  Goals Pt Will Perform Grooming: with max assist;sitting;bed level Pt Will Perform Upper Body Bathing: with max assist;bed level Pt Will Transfer to Toilet: with max assist;stand pivot transfer;squat pivot transfer Additional ADL Goal #1: Pt will initiate task with LUE 50% of time without additional cues  OT Frequency: Min 1X/week    Co-evaluation   Reason for Co-Treatment: Complexity of the patient's impairments (multi-system involvement);Necessary to address cognition/behavior during functional activity;For patient/therapist safety;To address functional/ADL transfers PT goals addressed during session: Mobility/safety with mobility;Balance        AM-PAC OT "6 Clicks" Daily Activity     Outcome Measure Help from another person eating meals?: Total Help from another person taking care of personal grooming?: Total Help from another person toileting, which includes using toliet, bedpan, or urinal?: Total Help from another person bathing (including washing, rinsing, drying)?: Total Help from another person to put on and taking off regular upper body clothing?: Total Help from another person to put on and taking off regular lower body clothing?: Total 6 Click Score: 6   End of Session Nurse Communication: Mobility status;Other (comment) (VSS in chair position  during session but back at [facility] 35 degrees now)  Activity Tolerance: Patient tolerated treatment well Patient left: in bed;with call bell/phone within reach;with bed alarm set;with family/visitor present  OT Visit Diagnosis: Unsteadiness on feet (R26.81);Muscle weakness (generalized) (M62.81)                Time: 1610-9604 OT Time Calculation (min): 25 min Charges:  OT General Charges $OT Visit: 1 Visit OT Evaluation $OT Eval Moderate Complexity: 1 Mod  Tyler Deis, OTR/L [facility] Acute Rehabilitation Office: [phone]   Myrla Halsted 03/30/2023, 5:48 PM

## 2023-03-30 NOTE — Progress Notes (Signed)
NAME:  Audrey Turner, MRN:  [id number], DOB:  01-22-1961, LOS: 14 ADMISSION DATE:  03/16/2023, CONSULTATION DATE:  12/07 REFERRING MD:  Monica Martinez, CHIEF COMPLAINT:  Acute encephalopathy   History of present illness   62 year old female who is w/c bound at baseline w/ RUE contracted and RLE weakness but oriented x3 after neurosurgery in 2007.  Lives w/ and cared for by brother [name] 5-7d ago was having some trouble swallowing.  Following that over the following days was having runny nose, sore throat and congestions. Her speech quality worsened her brother though laryngitis. Over last 2d PTA decreased PO intake, having more trouble swallowing, not able to take in all her Keppra over last 24 hrs PTA. On 12/7 was at home w/ brother. He thought he witnessed a seizure and called EMS. She received versed on way to ER.  On arrival to ER pt was unresponsive w/ left gaze pref.  Initial ER eval  CT and MRI brain negative for acute changes. Did show remote craniectomy w/ex vacuo dilatation of left lateral ventricle She was loaded w/ Keppra Initial labs: cbg 124, INR 1.6, Na 159, CO2 19, BUN 26, cr 1.53, Trop 145. Lactate 4.1 Neg COVID Blood cultures sent Antibiotics started.  On CCM arrival still not responsive. Rigid w/ possible nuchal rigidity. Left gaze pref. 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  History of seizures on Keppra 100 mg p.o. twice daily. Depression At baseline in [facility] since surgery  Full fxn on left. Able to stand and pivot but weak right leg. Contracted right arm. She is A&O at baseline but needs assist w/ ADLs including eating  Significant Hospital Events   12/7 admitted w/ sepsis and acute metabolic enceph w/ break thru seizures, PNA and possible meningitis started on  vanc, ceftriaxone and azith also decadron  12/8 LP - non concerning meningitis, d/c decadron and vanc  12/11 -trach cx - MSSA, switched to ancef 12/14- completed abx course 12/17- family meeting with palliative team, planning for extubation 12/19 12/19- extubated, needed bipap overnight.   Interim history/subjective:  Stayed off bipap overnight.   Objective   Blood pressure (!) 105/59, pulse 94, temperature 100.3 F (37.9 C), temperature source Axillary, resp. rate (!) 26, weight 48 kg, SpO2 98%.        Intake/Output Summary (Last 24 hours) at 03/30/2023 7829 Last data filed at 03/30/2023 0740 Gross per 24 hour  Intake 1723.33 ml  Output 643 ml  Net 1080.33 ml   Filed Weights   03/26/23 0710 03/27/23 0500 03/29/23 0709  Weight: 47 kg 45.4 kg 48 kg   Net even  Examination: Chronically ill appearing Weak, minimally responsive On nasal cannula, no respiratory distress Does not move upper or lower extremities but follows commands but nodding and sticking out tongue Diffuse anasarca    Labs/imaging reviewed  Hgb 8.4 Na 142 K 4.3 CO2 33 BUN 68 Cr 0.94  Resolved Hospital Problem list   AKI Sepsis  Assessment & Plan:  62 y.o. year old female with  Acute respiratory failure  MSSA pneumonia-treated abx ended 12/14 Extubated 12/19, and briefly needed bipap overnight. Now improving and on nasal cannula - chest xray shows improved pneumonia -BiPAP as tolerated  Leukocytosis White blood cell count from 21-22 today.  She was febrile overnight as well.  Blood cultures were recollected yesterday with no growth to date.  UA was negative for nitrites positive for leukocytes.   -repeat UA and blood cultures ngtd -trend CBC  Anemia Received 2 units 12/15 and increased from 5.4.  No signs of external bleeding.  Hemoglobin dropped again from 7.7-6.1.   - likely anemia of chronic disease, ldh wnl doubt hemolytic anemia  Electrolyte derangements Malnutrition Continue tube  feeding, monitor for refeeding Trend BMP  Systolic and diastolic heart failure Echo 12/8 with EF of 45 to 50%, grade 2 diastolic dysfunction Holding diuresis to to increase Cr  Depression -Continue amantadine, Abilify and Pristiq   Best practice:  Diet/type: NPO, holding feeds with bipap DVT prophylaxis: Heparin Pressure ulcer(s): present on admission - wound care saw, see note GI prophylaxis: H2B Lines: CVC Foley:  Yes, needed Code Status:  DNR/DNI Last date of multidisciplinary goals of care discussion - if has decline in respiratory status would transition to comfort measures. Appreciate palliative care involvement. May be able to transition to hospice as outpatient.   I will send out of the ICU today and sign out to [facility] to assue care 12/22.  I spent 35 minutes in total visit time for this patient, with more than 50% spent counseling/coordinating care.  Durel Salts, MD Pulmonary and Critical Care Medicine [facility] 03/30/2023 8:23 AM Pager: see AMION  If no response to pager, please call critical care on call (see AMION) until 7pm After 7:00 pm call Elink

## 2023-03-30 NOTE — Progress Notes (Signed)
Daily Progress Note   Patient Name: Audrey Turner       Date: 03/30/2023 DOB: 1960/07/02  Age: 62 y.o. MRN#: [id number] Attending Physician: Charlott Holler, MD Primary Care Physician: Joaquim Nam, MD Admit Date: 03/16/2023 Length of Stay: 14 days  Reason for Consultation/Follow-up: Establishing goals of care  HPI/Patient Profile:  62 y.o. female  with past medical history of astrocytoma s/p chemo, radiation, and multiple resections (2007), seizures, and depression admitted on 03/16/2023 with altered mental status.    Acute metabolic encephalopathy felt to be secondary to sepsis from pneumonia and status epilepticus. She was intubated for airway protection. She has now been off sedation for several days with limited improvement in mental status.   Patient faces treatment option decisions, advanced directive decisions and anticipatory care needs.  Subjective:   Subjective: Chart Reviewed. Updates received. Patient Assessed. Created space and opportunity for patient  and family to explore thoughts and feelings regarding current medical situation.  Today's Discussion: Today before seeing the patient I spoke with patient's bedside nurse.  Since coming off BiPAP yesterday she has not required going back on BiPAP.  She was a bit tachypneic yesterday but today she has done pretty well.  Respiratory rate has been low to mid 20s, saturations 98 to 100% on room air.  The team is planning to transfer her to stepdown for ongoing care.    I saw the patient at bedside, her brother and mother were present.  The patient is alert and awake, tracks, follows commands.  When asked her questions she nods indicating no pain, no shortness of breath.  Family is happy that she is still hanging in there.  They share how tough of a person she is in a feel she is fighting.  I encouraged them to continue to support and encourage her.  I shared my happiness that the patient is breathing better today.  I  shared long-term plan is to possibly transfer to stepdown, consider involvement of therapy for strengthening, eventual swallow eval to see if the patient is able to tolerate oral feedings.  I shared that further goals of care discussions will need to be had depending on how the patient progresses and that we are not out of the woods just yet.  They verbalized understanding of this.  I shared that that I would be at another hospital next week but somebody from my team would follow-up early next week to see how she is doing and support further conversations depending on her clinical progress.  I provided emotional and general support through therapeutic listening, empathy, sharing of stories, and other techniques. I answered all questions and addressed all concerns to the best of my ability.  Review of Systems  Constitutional:  Positive for fatigue.       Denies pain in general  Respiratory:  Negative for shortness of breath.   Gastrointestinal:  Negative for nausea and vomiting.    Objective:   Vital Signs:  BP 114/73   Pulse 95   Temp 98.9 F (37.2 C) (Oral)   Resp (!) 26   Wt 48 kg   SpO2 99%   BMI 20.67 kg/m   Physical Exam Vitals and nursing note reviewed.  Constitutional:      General: She is not in acute distress.    Appearance: She is ill-appearing.  HENT:     Head: Normocephalic and atraumatic.  Cardiovascular:     Rate and Rhythm: Normal rate.  Pulmonary:  Effort: Pulmonary effort is normal. Tachypnea (Mild, RR 22-25) present. No respiratory distress.  Abdominal:     General: Abdomen is flat. Bowel sounds are normal. There is no distension.     Palpations: Abdomen is soft.  Skin:    General: Skin is warm and dry.  Neurological:     General: No focal deficit present.     Mental Status: She is alert.  Psychiatric:        Mood and Affect: Mood normal.        Behavior: Behavior normal.     Palliative Assessment/Data: 10% (tube feeding)    Existing Vynca/ACP  Documentation: None  Assessment & Plan:   Impression: Present on Admission:  Sepsis (HCC)  SUMMARY OF RECOMMENDATIONS   DNR-limited DO NOT reintubate Continue current scope of care High risk for decompensation  Take it a day at a time Continued emotional support of patient and family Palliative medicine will follow-up early next week for ongoing GOC discussions depending on evolution of her clinical picture  Symptom Management:  Per primary team PMT is available to assist as needed  Code Status: DNR-limited  Prognosis: Unable to determine  Discharge Planning: To Be Determined  Discussed with: [name], medical team, nursing team  Thank you for allowing [place] to participate in the care of [name] PMT will continue to support holistically.  Time Total: 45 min  Detailed review of medical records (labs, imaging, vital signs), medically appropriate exam, discussed with treatment team, counseling and education to patient, family, & staff, documenting clinical information, medication management, coordination of care  Wynne Dust, NP Palliative Medicine Team  Team Phone # [phone] (Nights/Weekends)  12/06/2020, 8:17 AM

## 2023-03-30 NOTE — Progress Notes (Signed)
eLink Physician-Brief Progress Note Patient Name: Audrey Turner DOB: 09/18/1960 MRN: [id number]   Date of Service  03/30/2023  HPI/Events of Note  H 6.7 Received PRBC x 1 for Hg 6.1 yesterday  eICU Interventions  Family wishes to continue care but if patient declines, would want to transition to comfort  Tonight following commands and stable respiratory status  Transfuse PRBC x 1     Intervention Category Intermediate Interventions: Coagulopathy - evaluation and management  Mackenzy Grumbine Mechele Collin 03/30/2023, 4:07 AM

## 2023-03-30 NOTE — Progress Notes (Signed)
   03/30/23 1555  Spiritual Encounters  Type of Visit Follow up  Care provided to: Pt and family  Referral source Family;Physician  Reason for visit Routine spiritual support  OnCall Visit Yes   CH visited pt. per page from medical team requesting follow-up support, continuing spiritual care begun by [facility].  When [facility] visited, pt. lying in bed awake with brother, mother, sister in law, and daughter [name] at bedside.  Per staff, pt. not currently able to speak but has interacted with staff and family through nods.  Family shared that pt. has been in the hospital for two weeks and had been intubated until a few days ago, in critical condition.  Family received encouraging report from medical team today and they say pt.'s recovery is a "miracle."  In extended visit, CH learned that pt. lives with her brother, daughter, son-in-law and 25mo twin grandchildren.  Pt. was diagnosed with brain cancer 28yrs ago and following multiple surgical procedures has been in remission since then.  Family requested prayer at end of visit for pt.'s ongoing recovery and healing.

## 2023-03-30 NOTE — Evaluation (Signed)
Physical Therapy Evaluation Patient Details Name: Audrey Turner MRN: [id number] DOB: 23-May-1960 Today's Date: 03/30/2023  History of Present Illness  62 y.o. female admitted 12/7 with altered mental status, sepsis, metabolic encephalopathy, breakthrough seizures and Intubated. 12/19 extubated, weaned off BIPAP. With past medical history of astrocytoma s/p chemo, radiation, and multiple resections (2007), seizures, and depression   Clinical Impression  Pt admitted with above diagnosis. Family (brother and daughter) present during evaluation and very helpful/caring. Provided patient's history PLOF which was relatively dependent. She was able to stand and step pivot with assist from brother. He assists with bath/dressing. He has to push her wheelchair due to hx of Rt hemiparesis. Patient was alert and nodding yes/no to questions throughout session. Sat fully upright in bed with back support (chair mode). Attempted to facilitate some trunk control unsupported but not able at this point. She did do an excellent job of controlling her head when pulled away from back rest, and was able to perform some active cervical ROM albeit range was limited, in all directions. Her RR remained stable in the mid to high 20s throughout. Pt denied pain. SpO2 100% on RA, BP 119/62, slightly higher in chair position.  HR 96. Fatigued and falling asleep once reclined at end of session. Will follow and progress as tolerated during admission. Pt currently with functional limitations due to the deficits listed below (see PT Problem List). Pt will benefit from acute skilled PT to increase their independence and safety with mobility to allow discharge.           If plan is discharge home, recommend the following: Two people to help with walking and/or transfers;Two people to help with bathing/dressing/bathroom;Assistance with cooking/housework;Assistance with feeding;Direct supervision/assist for medications  management;Direct supervision/assist for financial management;Assist for transportation;Help with stairs or ramp for entrance;Supervision due to cognitive status   Can travel by private vehicle        Equipment Recommendations Other (comment) (TBD next venue.)  Recommendations for Other Services       Functional Status Assessment Patient has had a recent decline in their functional status and demonstrates the ability to make significant improvements in function in a reasonable and predictable amount of time.     Precautions / Restrictions Precautions Precautions: Fall Precaution Comments: Monitor vitals closely, esp RR and sats Restrictions Weight Bearing Restrictions Per Provider Order: No      Mobility  Bed Mobility Overal bed mobility: Needs Assistance             General bed mobility comments: Bed placed into chair setting with HOB elevated to 90 degrees and LEs dependent. Vitals remained stable throughout session.    Transfers                        Ambulation/Gait                  Stairs            Wheelchair Mobility     Tilt Bed    Modified Rankin (Stroke Patients Only)       Balance Overall balance assessment: Needs assistance   Sitting balance-Leahy Scale: Zero Sitting balance - Comments: Bed placed into chair position, assisted by therapists to bring trunk away from bed. She was able to facilitate forward head movement, and pull through LUE holding therapist's hand but could not control trunk today. She did an excellent job with head control. Able to perform short range rotation and look  up and down gently.                                     Pertinent Vitals/Pain Pain Assessment Pain Assessment: CPOT Facial Expression: Relaxed, neutral Body Movements: Absence of movements Muscle Tension: Relaxed Compliance with ventilator (intubated pts.): N/A Vocalization (extubated pts.): Talking in normal tone or no  sound CPOT Total: 0 Pain Intervention(s): Monitored during session, Repositioned    Home Living Family/patient expects to be discharged to:: Private residence Living Arrangements: Other (Comment) (brother) Available Help at Discharge: Family;Available 24 hours/day Type of Home: House Home Access: Ramped entrance (threshold into the door)       Home Layout: One level Home Equipment: BSC/3in1;Rolling Walker (2 wheels);Shower seat;Wheelchair - manual (full sized electric/adjustable bed.) Additional Comments: stays at mothers ~60% of time    Prior Function Prior Level of Function : Needs assist       Physical Assist : Mobility (physical);ADLs (physical) Mobility (physical): Transfers   Mobility Comments: Brother assists with step pivot transfer, she holds RW.       Extremity/Trunk Assessment   Upper Extremity Assessment Upper Extremity Assessment: Defer to OT evaluation    Lower Extremity Assessment Lower Extremity Assessment: Difficult to assess due to impaired cognition (Nods "yes" when asked if she can feel light touch in both LEs. Able to wiggle toes on Lt. Otherwise did not see any muscle activity in LEs when assessed.)       Communication   Communication Communication: Difficulty communicating thoughts/reduced clarity of speech Cueing Techniques: Verbal cues;Tactile cues;Gestural cues;Visual cues  Cognition Arousal: Alert Behavior During Therapy: Flat affect Overall Cognitive Status: Difficult to assess                                          General Comments General comments (skin integrity, edema, etc.): SpO2 100% on RA. HR 96, RR 25-29 during session. BP 119/62.    Exercises Low Level/ICU Exercises Ankle Circles/Pumps: PROM, Both, 5 reps, Supine (static holds) Short Arc Quad: PROM, Both, 10 reps (Static hold for HS stretch at the end)   Assessment/Plan    PT Assessment Patient needs continued PT services  PT Problem List Decreased  range of motion;Decreased strength;Decreased activity tolerance;Decreased balance;Decreased mobility;Decreased coordination;Decreased cognition;Decreased knowledge of use of DME;Decreased safety awareness;Decreased knowledge of precautions;Cardiopulmonary status limiting activity;Impaired tone       PT Treatment Interventions DME instruction;Functional mobility training;Therapeutic activities;Therapeutic exercise;Balance training;Neuromuscular re-education;Cognitive remediation;Patient/family education;Wheelchair mobility training    PT Goals (Current goals can be found in the Care Plan section)  Acute Rehab PT Goals Patient Stated Goal: none stated PT Goal Formulation: With family Time For Goal Achievement: 04/12/23 Potential to Achieve Goals: Poor    Frequency Min 1X/week     Co-evaluation PT/OT/SLP Co-Evaluation/Treatment: Yes Reason for Co-Treatment: Complexity of the patient's impairments (multi-system involvement);Necessary to address cognition/behavior during functional activity;For patient/therapist safety;To address functional/ADL transfers PT goals addressed during session: Mobility/safety with mobility;Balance         AM-PAC PT "6 Clicks" Mobility  Outcome Measure Help needed turning from your back to your side while in a flat bed without using bedrails?: Total Help needed moving from lying on your back to sitting on the side of a flat bed without using bedrails?: Total Help needed moving to and from a bed  to a chair (including a wheelchair)?: Total Help needed standing up from a chair using your arms (e.g., wheelchair or bedside chair)?: Total Help needed to walk in hospital room?: Total Help needed climbing 3-5 steps with a railing? : Total 6 Click Score: 6    End of Session   Activity Tolerance: Patient tolerated treatment well (Weakness) Patient left: in bed;with call bell/phone within reach;with bed alarm set;with family/visitor present;with SCD's  reapplied Nurse Communication: Mobility status PT Visit Diagnosis: Muscle weakness (generalized) (M62.81);Difficulty in walking, not elsewhere classified (R26.2);Other symptoms and signs involving the nervous system (R29.898)    Time: 8295-6213 PT Time Calculation (min) (ACUTE ONLY): 28 min   Charges:   PT Evaluation $PT Eval Moderate Complexity: 1 Mod   PT General Charges $$ ACUTE PT VISIT: 1 Visit         Kathlyn Sacramento, PT, DPT [facility]  Rehabilitation Services Physical Therapist Office: [phone] Website: [email or web address]   Berton Mount 03/30/2023, 5:40 PM

## 2023-03-31 ENCOUNTER — Inpatient Hospital Stay (HOSPITAL_COMMUNITY): Payer: Medicare Other

## 2023-03-31 LAB — CBC WITH DIFFERENTIAL/PLATELET
Abs Immature Granulocytes: 0.29 10*3/uL — ABNORMAL HIGH (ref 0.00–0.07)
Basophils Absolute: 0 10*3/uL (ref 0.0–0.1)
Basophils Relative: 0 %
Eosinophils Absolute: 0.2 10*3/uL (ref 0.0–0.5)
Eosinophils Relative: 2 %
HCT: 21 % — ABNORMAL LOW (ref 36.0–46.0)
Hemoglobin: 6.8 g/dL — CL (ref 12.0–15.0)
Immature Granulocytes: 2 %
Lymphocytes Relative: 17 %
Lymphs Abs: 2.1 10*3/uL (ref 0.7–4.0)
MCH: 29.8 pg (ref 26.0–34.0)
MCHC: 32.4 g/dL (ref 30.0–36.0)
MCV: 92.1 fL (ref 80.0–100.0)
Monocytes Absolute: 0.7 10*3/uL (ref 0.1–1.0)
Monocytes Relative: 6 %
Neutro Abs: 9.2 10*3/uL — ABNORMAL HIGH (ref 1.7–7.7)
Neutrophils Relative %: 73 %
Platelets: 302 10*3/uL (ref 150–400)
RBC: 2.28 MIL/uL — ABNORMAL LOW (ref 3.87–5.11)
RDW: 16.8 % — ABNORMAL HIGH (ref 11.5–15.5)
WBC: 12.7 10*3/uL — ABNORMAL HIGH (ref 4.0–10.5)
nRBC: 0.5 % — ABNORMAL HIGH (ref 0.0–0.2)

## 2023-03-31 LAB — GLUCOSE, CAPILLARY
Glucose-Capillary: 113 mg/dL — ABNORMAL HIGH (ref 70–99)
Glucose-Capillary: 115 mg/dL — ABNORMAL HIGH (ref 70–99)
Glucose-Capillary: 125 mg/dL — ABNORMAL HIGH (ref 70–99)
Glucose-Capillary: 77 mg/dL (ref 70–99)
Glucose-Capillary: 89 mg/dL (ref 70–99)
Glucose-Capillary: 99 mg/dL (ref 70–99)

## 2023-03-31 LAB — PREPARE RBC (CROSSMATCH)

## 2023-03-31 MED ORDER — SODIUM CHLORIDE 0.9% IV SOLUTION: Freq: Once | INTRAVENOUS | Status: AC

## 2023-03-31 NOTE — Progress Notes (Signed)
PROGRESS NOTE    Audrey Turner  FAO:[id number] DOB: 1960-08-27 DOA: 03/16/2023 PCP: Joaquim Nam, MD  Outpatient Specialists:     Brief Narrative:  Patient is a 62 year old female, with wheelchair bound at baseline, with right upper extremity contracture, right lower extremity weakness but oriented x3 after neurosurgery in 2007.  Patient was admitted with difficulty swallowing, runny nose, sore throat and congestions and possible witnessed seizure by patient's brother.  Patient's brother called EMS, and patient received versed on way to ER.  On arrival to ER pt was unresponsive w/ left gaze pref.  CT and MRI brain negative for acute changes. Did show remote craniectomy w/ex vacuo dilatation of left lateral ventricle.  Patient was loaded w/ Keppra, patient was admitted to ICU.  Patient was intubated by the ICU team and eventually extubated.  Patient has been transferred to the hospitalist team today to continue management.    Significant Hospital Events   12/7 admitted w/ sepsis and acute metabolic enceph w/ break thru seizures, PNA and possible meningitis started on vanc, ceftriaxone and azith also decadron  12/8 LP - non concerning meningitis, d/c decadron and vanc  12/11 -trach cx - MSSA, switched to ancef 12/14- completed abx course 12/17- family meeting with palliative team, planning for extubation 12/19 12/19- extubated, needed bipap overnight.   Resolved Hospital Problem list   AKI Sepsis  Assessment & Plan:   Principal Problem:   Sepsis (HCC) Active Problems:   Acute respiratory failure with hypoxia (HCC)   Pressure injury of skin   Altered mental status   Acute respiratory failure  MSSA pneumonia-treated abx ended 12/14 Extubated 12/19, and briefly needed bipap overnight.  Now on nasal cannula Chest xray shows improved pneumonia BiPAP as tolerated Patient has completed course of antibiotics.   Leukocytosis -Repeat CBC. -Persistent  leukocytosis. -Last WBC was 20.2.   -repeat UA revealed many bacteria.  Urine culture has not been visualized. -Repeat blood culture has not grown any organisms to date.     Anemia Received 2 units 12/15 and increased from 5.4.  No signs of external bleeding.  Hemoglobin dropped again from 7.7-6.1.   - likely anemia of chronic disease, ldh wnl doubt hemolytic anemia 03/31/2023: Hemoglobin was 8.4 g/dL.  Will repeat CBC   Electrolyte derangements Malnutrition Continue tube feeding, monitor for refeeding Trend BMP   Systolic and diastolic heart failure Echo 12/8 with EF of 45 to 50%, grade 2 diastolic dysfunction Holding diuresis to to increase Cr   Depression -Continue amantadine, Abilify and Pristiq  DVT prophylaxis: Heparin. Code Status: Limited I DO NOT RESUSCITATE. Family Communication:  Disposition Plan:    Consultants:  Neurology. Palliative and hospice medicine. Cardiology.  Procedures:  Intubation and extubation.  Antimicrobials:  None.   Subjective: Patient is nonverbal.  Objective: Vitals:   03/31/23 0500 03/31/23 0803 03/31/23 1225 03/31/23 1542  BP:  (!) 117/56 (!) 117/58 117/63  Pulse:  93 90 96  Resp:  (!) 21 (!) 22 20  Temp:  98.3 F (36.8 C) (!) 97.5 F (36.4 C) 98.4 F (36.9 C)  TempSrc:  Axillary Axillary Axillary  SpO2:  100% 100% 100%  Weight: 49 kg       Intake/Output Summary (Last 24 hours) at 03/31/2023 1720 Last data filed at 03/31/2023 1607 Gross per 24 hour  Intake 1929.83 ml  Output 975 ml  Net 954.83 ml   Filed Weights   03/27/23 0500 03/29/23 0709 03/31/23 0500  Weight: 45.4 kg 48 kg 49  kg    Examination:  General exam: Awake but nonverbal.  Patient has feeding tube.  Not in any distress.   Respiratory system: Clear to auscultation.  Cardiovascular system: S1 & S2  Gastrointestinal system: Abdomen is soft and nontender.   Central nervous system: Patient is awake.   Extremities: No leg edema.  Data Reviewed: I  have personally reviewed following labs and imaging studies  CBC: Recent Labs  Lab 03/26/23 0358 03/27/23 0330 03/28/23 0505 03/29/23 0407 03/30/23 0325 03/30/23 0832  WBC 18.5* 18.5* 21.9* 21.5*  22.0* 20.2*  --   NEUTROABS  --   --   --  18.1*  --   --   HGB 9.1* 9.5* 7.7* 6.1*  6.1* 6.7* 8.4*  HCT 26.6* 28.2* 23.4* 18.4*  18.7* 20.0* 23.7*  MCV 83.1 85.2 86.7 87.2  87.0 87.0  --   PLT 263 310 303 344  345 313  --    Basic Metabolic Panel: Recent Labs  Lab 03/25/23 0308 03/26/23 0358 03/27/23 0330 03/28/23 0505 03/29/23 0407 03/30/23 0325  NA 134* 136 141 139 139 142  K 4.0 3.9 3.7 4.1 4.6 4.3  CL 93* 92* 92* 98 100 101  CO2 35* 36* 33* 33* 28 33*  GLUCOSE 120* 167* 133* 178* 230* 165*  BUN 34* 38* 38* 44* 61* 68*  CREATININE 0.51 0.56 0.66 0.68 1.00 0.94  CALCIUM 7.8* 8.2* 8.3* 7.8* 7.7* 7.8*  MG 2.0  --   --  2.2 2.3 2.5*  PHOS  --   --   --  3.8 5.0* 4.2   GFR: Estimated Creatinine Clearance: 45.7 mL/min (by C-G formula based on SCr of 0.94 mg/dL). Liver Function Tests: No results for input(s): "AST", "ALT", "ALKPHOS", "BILITOT", "PROT", "ALBUMIN" in the last 168 hours. No results for input(s): "LIPASE", "AMYLASE" in the last 168 hours. No results for input(s): "AMMONIA" in the last 168 hours. Coagulation Profile: No results for input(s): "INR", "PROTIME" in the last 168 hours. Cardiac Enzymes: No results for input(s): "CKTOTAL", "CKMB", "CKMBINDEX", "TROPONINI" in the last 168 hours. BNP (last 3 results) No results for input(s): "PROBNP" in the last 8760 hours. HbA1C: No results for input(s): "HGBA1C" in the last 72 hours. CBG: Recent Labs  Lab 03/31/23 0022 03/31/23 0620 03/31/23 0812 03/31/23 1228 03/31/23 1546  GLUCAP 99 113* 77 125* 115*   Lipid Profile: No results for input(s): "CHOL", "HDL", "LDLCALC", "TRIG", "CHOLHDL", "LDLDIRECT" in the last 72 hours. Thyroid Function Tests: No results for input(s): "TSH", "T4TOTAL", "FREET4",  "T3FREE", "THYROIDAB" in the last 72 hours. Anemia Panel: No results for input(s): "VITAMINB12", "FOLATE", "FERRITIN", "TIBC", "IRON", "RETICCTPCT" in the last 72 hours. Urine analysis:    Component Value Date/Time   COLORURINE YELLOW 03/28/2023 1029   APPEARANCEUR CLOUDY (A) 03/28/2023 1029   LABSPEC 1.018 03/28/2023 1029   PHURINE 5.0 03/28/2023 1029   GLUCOSEU NEGATIVE 03/28/2023 1029   HGBUR NEGATIVE 03/28/2023 1029   HGBUR negative 11/27/2007 0924   BILIRUBINUR NEGATIVE 03/28/2023 1029   BILIRUBINUR negative 10/15/2011 1622   KETONESUR NEGATIVE 03/28/2023 1029   PROTEINUR NEGATIVE 03/28/2023 1029   UROBILINOGEN 0.2 10/15/2011 1622   UROBILINOGEN 0.2 11/27/2007 0924   NITRITE NEGATIVE 03/28/2023 1029   LEUKOCYTESUR MODERATE (A) 03/28/2023 1029   Sepsis Labs: @LABRCNTIP (procalcitonin:4,lacticidven:4)  ) Recent Results (from the past 240 hours)  Culture, blood (Routine X 2) w Reflex to ID Panel     Status: None (Preliminary result)   Collection Time: 03/28/23  9:09 AM  Specimen: BLOOD LEFT HAND  Result Value Ref Range Status   Specimen Description BLOOD LEFT HAND  Final   Special Requests   Final    BOTTLES DRAWN AEROBIC AND ANAEROBIC Blood Culture adequate volume   Culture   Final    NO GROWTH 3 DAYS Performed at [facility], [address], [place], [place] [address]    Report Status PENDING  Incomplete  Culture, blood (Routine X 2) w Reflex to ID Panel     Status: None (Preliminary result)   Collection Time: 03/28/23  9:11 AM   Specimen: BLOOD LEFT HAND  Result Value Ref Range Status   Specimen Description BLOOD LEFT HAND  Final   Special Requests   Final    BOTTLES DRAWN AEROBIC AND ANAEROBIC Blood Culture adequate volume   Culture   Final    NO GROWTH 3 DAYS Performed at [facility], [address] [place], [place] [address]    Report Status PENDING  Incomplete         Radiology Studies: No results found.      Scheduled Meds:   sodium chloride   Intravenous Once   sodium chloride   Intravenous Once   amantadine  100 mg Per Tube BID   ascorbic acid  500 mg Per Tube Daily   Chlorhexidine Gluconate Cloth  6 each Topical Daily   famotidine  20 mg Per Tube Daily   fiber supplement (BANATROL TF)  60 mL Per Tube Q8H   Gerhardt's butt cream   Topical TID   heparin injection (subcutaneous)  5,000 Units Subcutaneous Q8H   insulin aspart  0-15 Units Subcutaneous Q4H   leptospermum manuka honey  1 Application Topical Daily   levETIRAcetam  1,000 mg Per Tube BID   mouth rinse  15 mL Mouth Rinse 4 times per day   thiamine  100 mg Per Tube Daily   zinc sulfate (50mg  elemental zinc)  220 mg Per Tube Daily   Continuous Infusions:  feeding supplement (OSMOLITE 1.2 CAL) 1,000 mL (03/31/23 0101)     LOS: 15 days    Time spent: 55 minutes.    Berton Mount, MD  [facility] Pager #: [phone] 7PM-7AM contact night coverage as above

## 2023-03-31 NOTE — Progress Notes (Signed)
Date and time results received: 03/31/23 1900 (use smartphrase ".now" to insert current time)  Test: hgb  Critical Value: 6.8  Name of Provider Notified: Leonia Corona MD  Orders Received? Or Actions Taken?: 1 Unit of blood ordered

## 2023-03-31 NOTE — Progress Notes (Signed)
Dr. Dartha Lodge notified pts hgb 6.8 and verbal order given to transfuse 1 unit of packed red blood cells.

## 2023-03-31 NOTE — Progress Notes (Signed)
Pt was transferred from 65M to 4E20. Alert,expressive aphasia/non verbal, able to respond to commands by nodding her head or blinking her eyes, stable hemodynamically, afebrile, no acute distress noted at arrival.   On room air, NSR, normal respiratory rate and efforts, SPO2 100%, Lungs clear on auscultations bilaterally.   NPO, On Cortex tube feeding with Osmolite 1.2 cal rate 50 ml/hr with 30 ml of free water flushing q 4hrs.  Pain assessment performed. Pt has been tolerated and able to rest comfortably.  Plan of care was reviewed. Pt has been progressing.   CHG bath was given. Skin assessment was done with Tim, the 2nd RN that Pt has stage 3 of pressure ulcer under sacral area. Skin care was provide per protocol, wound care every morning per order with [facility], position changed q 2hrs provided.   A new Foley cath and bag was changed, Per-care and foley-care provided by ICU RN prior transferred to 4E.   We will continue to monitor.  Filiberto Pinks, RN

## 2023-03-31 NOTE — Plan of Care (Signed)
  Problem: Education: Goal: Ability to describe self-care measures that may prevent or decrease complications (Diabetes Survival Skills Education) will improve Outcome: Progressing Goal: Individualized Educational Video(s) Outcome: Progressing   Problem: Coping: Goal: Ability to adjust to condition or change in health will improve Outcome: Progressing   Problem: Fluid Volume: Goal: Ability to maintain a balanced intake and output will improve Outcome: Progressing   Problem: Health Behavior/Discharge Planning: Goal: Ability to identify and utilize available resources and services will improve Outcome: Progressing Goal: Ability to manage health-related needs will improve Outcome: Progressing   Problem: Metabolic: Goal: Ability to maintain appropriate glucose levels will improve Outcome: Progressing   Problem: Nutritional: Goal: Maintenance of adequate nutrition will improve Outcome: Progressing Goal: Progress toward achieving an optimal weight will improve Outcome: Progressing   Problem: Skin Integrity: Goal: Risk for impaired skin integrity will decrease Outcome: Progressing   Problem: Tissue Perfusion: Goal: Adequacy of tissue perfusion will improve Outcome: Progressing   Problem: Activity: Goal: Ability to tolerate increased activity will improve Outcome: Progressing   Problem: Respiratory: Goal: Ability to maintain a clear airway and adequate ventilation will improve Outcome: Progressing   Problem: Role Relationship: Goal: Method of communication will improve Outcome: Progressing   Problem: Education: Goal: Knowledge of General Education information will improve Description: Including pain rating scale, medication(s)/side effects and non-pharmacologic comfort measures Outcome: Progressing   Problem: Health Behavior/Discharge Planning: Goal: Ability to manage health-related needs will improve Outcome: Progressing   Problem: Clinical Measurements: Goal:  Ability to maintain clinical measurements within normal limits will improve Outcome: Progressing Goal: Will remain free from infection Outcome: Progressing Goal: Diagnostic test results will improve Outcome: Progressing Goal: Respiratory complications will improve Outcome: Progressing Goal: Cardiovascular complication will be avoided Outcome: Progressing   Problem: Activity: Goal: Risk for activity intolerance will decrease Outcome: Progressing   Problem: Nutrition: Goal: Adequate nutrition will be maintained Outcome: Progressing   Problem: Coping: Goal: Level of anxiety will decrease Outcome: Progressing   Problem: Elimination: Goal: Will not experience complications related to bowel motility Outcome: Progressing Goal: Will not experience complications related to urinary retention Outcome: Progressing   Problem: Pain Management: Goal: General experience of comfort will improve Outcome: Progressing   Problem: Safety: Goal: Ability to remain free from injury will improve Outcome: Progressing   Problem: Skin Integrity: Goal: Risk for impaired skin integrity will decrease Outcome: Progressing

## 2023-04-01 LAB — BPAM RBC
Blood Product Expiration Date: 202412252359
Blood Product Expiration Date: 202412302359
Blood Product Expiration Date: 202501062359
ISSUE DATE / TIME: 202412200607
ISSUE DATE / TIME: 202412210417
ISSUE DATE / TIME: 202412222130
Unit Type and Rh: 7300
Unit Type and Rh: 7300
Unit Type and Rh: 7300

## 2023-04-01 LAB — CBC WITH DIFFERENTIAL/PLATELET
Abs Immature Granulocytes: 0.38 10*3/uL — ABNORMAL HIGH (ref 0.00–0.07)
Basophils Absolute: 0 10*3/uL (ref 0.0–0.1)
Basophils Relative: 0 %
Eosinophils Absolute: 0.3 10*3/uL (ref 0.0–0.5)
Eosinophils Relative: 2 %
HCT: 25.5 % — ABNORMAL LOW (ref 36.0–46.0)
Hemoglobin: 8.3 g/dL — ABNORMAL LOW (ref 12.0–15.0)
Immature Granulocytes: 3 %
Lymphocytes Relative: 19 %
Lymphs Abs: 2.2 10*3/uL (ref 0.7–4.0)
MCH: 29.1 pg (ref 26.0–34.0)
MCHC: 32.5 g/dL (ref 30.0–36.0)
MCV: 89.5 fL (ref 80.0–100.0)
Monocytes Absolute: 0.8 10*3/uL (ref 0.1–1.0)
Monocytes Relative: 6 %
Neutro Abs: 8.4 10*3/uL — ABNORMAL HIGH (ref 1.7–7.7)
Neutrophils Relative %: 70 %
Platelets: 288 10*3/uL (ref 150–400)
RBC: 2.85 MIL/uL — ABNORMAL LOW (ref 3.87–5.11)
RDW: 18.3 % — ABNORMAL HIGH (ref 11.5–15.5)
WBC: 12 10*3/uL — ABNORMAL HIGH (ref 4.0–10.5)
nRBC: 0.5 % — ABNORMAL HIGH (ref 0.0–0.2)

## 2023-04-01 LAB — GLUCOSE, CAPILLARY
Glucose-Capillary: 103 mg/dL — ABNORMAL HIGH (ref 70–99)
Glucose-Capillary: 104 mg/dL — ABNORMAL HIGH (ref 70–99)
Glucose-Capillary: 109 mg/dL — ABNORMAL HIGH (ref 70–99)
Glucose-Capillary: 113 mg/dL — ABNORMAL HIGH (ref 70–99)
Glucose-Capillary: 137 mg/dL — ABNORMAL HIGH (ref 70–99)
Glucose-Capillary: 137 mg/dL — ABNORMAL HIGH (ref 70–99)
Glucose-Capillary: 75 mg/dL (ref 70–99)

## 2023-04-01 LAB — TYPE AND SCREEN
ABO/RH(D): B POS
Antibody Screen: NEGATIVE
Unit division: 0
Unit division: 0
Unit division: 0

## 2023-04-01 LAB — RENAL FUNCTION PANEL
Albumin: 1.6 g/dL — ABNORMAL LOW (ref 3.5–5.0)
Anion gap: 7 (ref 5–15)
BUN: 42 mg/dL — ABNORMAL HIGH (ref 6–20)
CO2: 31 mmol/L (ref 22–32)
Calcium: 7.8 mg/dL — ABNORMAL LOW (ref 8.9–10.3)
Chloride: 104 mmol/L (ref 98–111)
Creatinine, Ser: 0.79 mg/dL (ref 0.44–1.00)
GFR, Estimated: >60 mL/min (ref >60)
Glucose, Bld: 124 mg/dL — ABNORMAL HIGH (ref 70–99)
Phosphorus: 3.2 mg/dL (ref 2.5–4.6)
Potassium: 4.6 mmol/L (ref 3.5–5.1)
Sodium: 142 mmol/L (ref 135–145)

## 2023-04-01 LAB — MAGNESIUM: Magnesium: 2.4 mg/dL (ref 1.7–2.4)

## 2023-04-01 MED ORDER — LEVETIRACETAM 100 MG/ML PO SOLN
1000.0000 mg | Freq: Two times a day (BID) | ORAL | Status: DC
  Administered 2023-04-01 – 2023-04-09 (×18): 1000 mg
  Filled 2023-04-01 (×19): qty 10

## 2023-04-01 NOTE — Progress Notes (Signed)
  Progress Note   Patient: Audrey Turner DOB: 08/28/1960 DOA: 03/16/2023     16 DOS: the patient was seen and examined on 04/01/2023 at 11:38AM      Brief hospital course: 62 y.o. F with history neurocytoma s/p resection in 2007, seizures, who presented with change in mentation, admitted to ICU for seizures, sepsis.     Assessment and Plan: MSSA pneumonia Completed treatment  Acute respiratory failure with hypoxia Weaned to room air  Acute metabolic encephalopathy At baseline the patient is wheelchair-bound, has cognitive impairment and dementia, and requires assistance for ADLs including feeding.  She currently has delirium from her severe sepsis. No change in mentation, not alert enough to swallow. -Delirium precautions - Continue home amantadine -Continue tube feeds  Seizure disorder No seizures observed in the last 24 hours - Continue Keppra  Depression -Hold Pristiq and Abilify  Stage III pressure injury, right buttock Unstageable pressure injury, sacrum Both present on admission - WOC      Subjective: Patient has had no improvement in mentation.  No fever, no respiratory distress     Physical Exam: BP 128/77 (BP Location: Left Wrist)   Pulse 87   Temp 97.7 F (36.5 C) (Axillary)   Resp (!) 23   Wt 50.4 kg   SpO2 100%   BMI 21.70 kg/m   Chronically ill-appearing adult female, lying in bed, appears weak and tired RRR, no murmurs, no peripheral edema Respiratory rate normal, lungs clear without rales or wheezes Abdomen soft, no grimace to palpation Contractured on the right side, able to get my hand to squeeze on the left, can follow commands to wiggle toes on the left, not on the right, unable to make any verbalizations, briefly makes eye contact, then closes her eyes    Data Reviewed: Discussed with palliative care BUN/creatinine ratio elevated Creatinine normal Hemoglobin up to 8.3 White blood cell count shows  leukocytosis  Family Communication: Brother at the bedside    Disposition: Status is: Inpatient         Author: Alberteen Sam, MD 04/01/2023 7:02 PM  For on call review [email or web address].

## 2023-04-01 NOTE — Plan of Care (Signed)
  Problem: Education: Goal: Ability to describe self-care measures that may prevent or decrease complications (Diabetes Survival Skills Education) will improve Outcome: Progressing Goal: Individualized Educational Video(s) Outcome: Progressing   Problem: Coping: Goal: Ability to adjust to condition or change in health will improve Outcome: Progressing   Problem: Fluid Volume: Goal: Ability to maintain a balanced intake and output will improve Outcome: Progressing   Problem: Health Behavior/Discharge Planning: Goal: Ability to identify and utilize available resources and services will improve Outcome: Progressing Goal: Ability to manage health-related needs will improve Outcome: Progressing   Problem: Metabolic: Goal: Ability to maintain appropriate glucose levels will improve Outcome: Progressing   Problem: Nutritional: Goal: Maintenance of adequate nutrition will improve Outcome: Progressing Goal: Progress toward achieving an optimal weight will improve Outcome: Progressing   Problem: Skin Integrity: Goal: Risk for impaired skin integrity will decrease Outcome: Progressing   Problem: Tissue Perfusion: Goal: Adequacy of tissue perfusion will improve Outcome: Progressing   Problem: Activity: Goal: Ability to tolerate increased activity will improve Outcome: Progressing   Problem: Respiratory: Goal: Ability to maintain a clear airway and adequate ventilation will improve Outcome: Progressing   Problem: Role Relationship: Goal: Method of communication will improve Outcome: Progressing   Problem: Education: Goal: Knowledge of General Education information will improve Description: Including pain rating scale, medication(s)/side effects and non-pharmacologic comfort measures Outcome: Progressing   Problem: Health Behavior/Discharge Planning: Goal: Ability to manage health-related needs will improve Outcome: Progressing   Problem: Clinical Measurements: Goal:  Ability to maintain clinical measurements within normal limits will improve Outcome: Progressing Goal: Will remain free from infection Outcome: Progressing Goal: Diagnostic test results will improve Outcome: Progressing Goal: Respiratory complications will improve Outcome: Progressing Goal: Cardiovascular complication will be avoided Outcome: Progressing   Problem: Activity: Goal: Risk for activity intolerance will decrease Outcome: Progressing   Problem: Nutrition: Goal: Adequate nutrition will be maintained Outcome: Progressing   Problem: Coping: Goal: Level of anxiety will decrease Outcome: Progressing   Problem: Elimination: Goal: Will not experience complications related to bowel motility Outcome: Progressing Goal: Will not experience complications related to urinary retention Outcome: Progressing   Problem: Pain Management: Goal: General experience of comfort will improve Outcome: Progressing   Problem: Safety: Goal: Ability to remain free from injury will improve Outcome: Progressing   Problem: Skin Integrity: Goal: Risk for impaired skin integrity will decrease Outcome: Progressing

## 2023-04-01 NOTE — Progress Notes (Signed)
SLP Cancellation Note  Patient Details Name: Lumen Mcclard MRN: [id number] DOB: 11/16/1960   Cancelled treatment:       Reason Eval/Treat Not Completed: Fatigue/lethargy limiting ability to participate   Pat Rochele Lueck,M.S.,CCC-SLP 04/01/2023, 9:49 AM

## 2023-04-01 NOTE — Progress Notes (Signed)
Pt completed getting transfusion one unit of PRBC. Her hemodynamics have been remaining stable, NSR on the monitor, afebrile, on room air, no respiratory distress noted. We will check her lab at am.   Pt's brother, Mr. Lucia Estelle, who is a health care power of attorney and a main care giver for this Pt, requested the attending MD, Dr. Dartha Lodge, to please call him at am to update the plan of care.   Filiberto Pinks, RN

## 2023-04-01 NOTE — Progress Notes (Signed)
Daily Progress Note   Patient Name: Audrey Turner       Date: 04/01/2023 DOB: 1961/03/03  Age: 62 y.o. MRN#: [id number] Attending Physician: Alberteen Sam, * Primary Care Physician: Joaquim Nam, MD Admit Date: 03/16/2023  Reason for Consultation/Follow-up: Establishing goals of care  Patient Profile/HPI:     62 y.o. female  with past medical history of astrocytoma s/p chemo, radiation, and multiple resections (2007), seizures, and depression admitted on 03/16/2023 with altered mental status.    Acute metabolic encephalopathy felt to be secondary to sepsis from pneumonia and status epilepticus. She was intubated for airway protection. She has now been off sedation for several days with limited improvement in mental status.   Patient faces treatment option decisions, advanced directive decisions and anticipatory care needs.  Subjective: Chart reviewed including labs, progress notes, imaging from this and previous encounters.  Noted patient too lethargic to participate in SLP evaluation today.  On my evaluation patient lethargic- opens eyes, nonverbal.  Family- brother, Audrey Turner, and daughter, Audrey Turner at bedside. Confirmed that brother Audrey Turner is HCPOA- document requested.  Per Audrey Turner they met with Dr. Maryfrances Bunnell today and they agree with his recommendations to monitor patient for the next two weeks, hopeful for improvement in her mental status.   Review of Systems  Unable to perform ROS: Acuity of condition     Physical Exam Vitals and nursing note reviewed.  Constitutional:      Appearance: She is ill-appearing.     Comments: frail  HENT:     Nose:     Comments: Cortrak in place Skin:    Coloration: Skin is pale.  Neurological:     Comments: nonverbal              Vital Signs: BP 121/68 (BP Location: Left Wrist)   Pulse 87   Temp 98.7 F (37.1 C) (Axillary)   Resp 20   Wt 50.4 kg   SpO2 100%   BMI 21.70 kg/m  SpO2: SpO2: 100 % O2 Device: O2 Device: Room Air O2 Flow Rate: O2 Flow Rate (L/min): 3 L/min  Intake/output summary:  Intake/Output Summary (Last 24 hours) at 04/01/2023 1523 Last data filed at 04/01/2023 0800 Gross per 24 hour  Intake 1905.17 ml  Output 300 ml  Net 1605.17 ml   LBM: Last BM  Date : 04/01/23 Baseline Weight: Weight:  (scale malfunctioning) Most recent weight: Weight: 50.4 kg       Palliative Assessment/Data: PPS: 20% (with cortrak)      Patient Active Problem List   Diagnosis Date Noted   Altered mental status 03/27/2023   Pressure injury of skin 03/25/2023   Acute respiratory failure with hypoxia (HCC) 03/17/2023   URI (upper respiratory infection) 03/17/2023   Sepsis (HCC) 03/16/2023   Epistaxis 10/15/2021   Cold extremities 06/07/2021   Insomnia 01/22/2021   Healthcare maintenance 09/08/2019   Contracture of hand 09/08/2019   Urinary incontinence 06/12/2019   Medicare annual wellness visit, subsequent 06/11/2018   Lower extremity edema 06/11/2018   HLD (hyperlipidemia) 05/13/2017   Localization-related symptomatic epilepsy and epileptic syndromes with complex partial seizures, not intractable, without status epilepticus (HCC) 07/17/2016   Hx of astrocytoma 07/17/2016   Hypoxia, sleep related 07/03/2016   Convulsions/seizures (HCC) 12/14/2015   Advance care planning 12/13/2015   Muscular deconditioning 11/10/2015   Hyperglycemia 03/12/2013   Obstructive sleep apnea 12/22/2009   Depression 08/14/2007   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 Sheryn Bison recommendation to monitor patient for next two weeks PMT will follow peripherally and see as  needed   Code Status: DNR  Prognosis:  Unable to determine  Discharge Planning: To Be Determined  Care plan was discussed with patient's family  Thank you for allowing the Palliative Medicine Team to assist in the care of this patient.  Total time: 60 minutes Prolonged billing:  Time includes:   Preparing to see the patient (e.g., review of tests) Obtaining and/or reviewing separately obtained history Performing a medically necessary appropriate examination and/or evaluation Counseling and educating the patient/family/caregiver Ordering medications, tests, or procedures Referring and communicating with other health care professionals (when not reported separately) Documenting clinical information in the electronic or other health record Independently interpreting results (not reported separately) and communicating results to the patient/family/caregiver Care coordination (not reported separately) Clinical documentation  Audrey Turner, AGNP-C Palliative Medicine   Please contact Palliative Medicine Team phone at [phone] for questions and concerns.

## 2023-04-01 NOTE — Hospital Course (Addendum)
62 y.o. F with history neurocytoma s/p resection in 2007, seizures, who presented with change in mentation, admitted to ICU for seizures, sepsis.    Assessment and Plan: MSSA pneumonia, present on admission Acute respiratory failure with hypoxia, present on admission Acute metabolic encephalopathy HCAP Depression Seizure disorder Stage III pressure injury, right buttock Unstageable pressure injury, sacrum At baseline the patient is wheelchair-bound, has cognitive impairment and dementia, and requires assistance for ADLs including feeding.  Post-extubation, she had very limited physical and cognitive recovery.  Currently has severe weakness, too weak to vocalize, lift her arms or head, swallow or cough.    Speech therapy have been following patient and she has remained too weak to swallow.  FEES 12/27 notable for immediately obvious copious secretions covering the vocal cords that she was unable to clear with cues.  She remains unable to produce volitional cough, has a wet vocal quality, and mostly aphonic on attempts to vocalize  In GOC discussion on 12/29 with brothers (one of which is POA) and 2/3 daughters, the decision was made to pursue comfort measures and Hospice - Consult Hospice - Delirium precautions - Continue Keppra, amantadine for comfort - Continue tube feeds and Cipro for now, plan for discontinuation of all of the above at time of discharge to Hospice - Stop Pristiq and Abilify           Subjective: No change. Patient nonverbal.  No nursing concerns.         Physical Exam:  Chronically ill-appearing adult female, lying in bed, appears weak and tired RRR, no murmurs, no peripheral edema Respiratory rate normal, lungs clear without rales or wheezes Abdomen soft, no grimace to palpation Contractured on the right side, able to get my hand to squeeze on the left, can follow commands to wiggle toes on the left, not on the right, unable to make any verbalizations,  briefly makes eye contact, then closes her eyes       Data Reviewed:

## 2023-04-01 NOTE — Progress Notes (Signed)
This chaplain responded to the family's request for a continued spiritual care presence. The Pt. is resting comfortably with her brother-[name] and daughter-Sarah at the bedside. The chaplain understands the Pt. family appreciates the medical team's care and space for watchful waiting.   The family accepted the chaplain's invitation for storytelling. The chaplain understands through reflective listening the Pt., as a sibling, has a mischievous loving spirit. As a mother, the Pt. describes her mother having more than enough love for her and her friends with 100% involvement in all things.   This chaplain heard [name] and [name] accept Dr. Sheryn Bison request to remain by the Pt. bedside, communicating changes, celebrations and concerns for the Pt. The chaplain accepted the family's request for prayer at the bedside.  Chaplain Stephanie Acre [phone]

## 2023-04-02 LAB — CBC
HCT: 25.6 % — ABNORMAL LOW (ref 36.0–46.0)
Hemoglobin: 8.4 g/dL — ABNORMAL LOW (ref 12.0–15.0)
MCH: 29.7 pg (ref 26.0–34.0)
MCHC: 32.8 g/dL (ref 30.0–36.0)
MCV: 90.5 fL (ref 80.0–100.0)
Platelets: 322 K/uL (ref 150–400)
RBC: 2.83 MIL/uL — ABNORMAL LOW (ref 3.87–5.11)
RDW: 17.3 % — ABNORMAL HIGH (ref 11.5–15.5)
WBC: 11.8 K/uL — ABNORMAL HIGH (ref 4.0–10.5)
nRBC: 0.3 % — ABNORMAL HIGH (ref 0.0–0.2)

## 2023-04-02 LAB — GLUCOSE, CAPILLARY
Glucose-Capillary: 106 mg/dL — ABNORMAL HIGH (ref 70–99)
Glucose-Capillary: 81 mg/dL (ref 70–99)
Glucose-Capillary: 75 mg/dL (ref 70–99)
Glucose-Capillary: 99 mg/dL (ref 70–99)
Glucose-Capillary: 110 mg/dL — ABNORMAL HIGH (ref 70–99)

## 2023-04-02 LAB — COMPREHENSIVE METABOLIC PANEL WITH GFR
ALT: 47 U/L — ABNORMAL HIGH (ref 0–44)
AST: 42 U/L — ABNORMAL HIGH (ref 15–41)
Albumin: 1.6 g/dL — ABNORMAL LOW (ref 3.5–5.0)
Alkaline Phosphatase: 127 U/L — ABNORMAL HIGH (ref 38–126)
Anion gap: 5 (ref 5–15)
BUN: 39 mg/dL — ABNORMAL HIGH (ref 6–20)
CO2: 30 mmol/L (ref 22–32)
Calcium: 7.9 mg/dL — ABNORMAL LOW (ref 8.9–10.3)
Chloride: 105 mmol/L (ref 98–111)
Creatinine, Ser: 0.7 mg/dL (ref 0.44–1.00)
GFR, Estimated: >60 mL/min
Glucose, Bld: 133 mg/dL — ABNORMAL HIGH (ref 70–99)
Potassium: 4.7 mmol/L (ref 3.5–5.1)
Sodium: 140 mmol/L (ref 135–145)
Total Bilirubin: 1 mg/dL
Total Protein: 4.8 g/dL — ABNORMAL LOW (ref 6.5–8.1)

## 2023-04-02 LAB — CULTURE, BLOOD (ROUTINE X 2)
Culture: NO GROWTH
Culture: NO GROWTH
Special Requests: ADEQUATE
Special Requests: ADEQUATE

## 2023-04-02 LAB — CULTURE, OB URINE: Culture: 60000 — AB

## 2023-04-02 MED ORDER — FREE WATER
100.0000 mL | Status: DC
  Administered 2023-04-02 – 2023-04-10 (×47): 100 mL

## 2023-04-02 NOTE — Evaluation (Addendum)
Speech Language Pathology Evaluation Patient Details Name: Audrey Turner MRN: [id number] DOB: 08-Jul-1960 Today's Date: 04/02/2023 Time: 1000-1013 SLP Time Calculation (min) (ACUTE ONLY): 13 min  Problem List:  Patient Active Problem List   Diagnosis Date Noted   Altered mental status 03/27/2023   Pressure injury of skin 03/25/2023   Acute respiratory failure with hypoxia (HCC) 03/17/2023   URI (upper respiratory infection) 03/17/2023   Sepsis (HCC) 03/16/2023   Epistaxis 10/15/2021   Cold extremities 06/07/2021   Insomnia 01/22/2021   Healthcare maintenance 09/08/2019   Contracture of hand 09/08/2019   Urinary incontinence 06/12/2019   Medicare annual wellness visit, subsequent 06/11/2018   Lower extremity edema 06/11/2018   HLD (hyperlipidemia) 05/13/2017   Localization-related symptomatic epilepsy and epileptic syndromes with complex partial seizures, not intractable, without status epilepticus (HCC) 07/17/2016   Hx of astrocytoma 07/17/2016   Hypoxia, sleep related 07/03/2016   Convulsions/seizures (HCC) 12/14/2015   Advance care planning 12/13/2015   Muscular deconditioning 11/10/2015   Hyperglycemia 03/12/2013   Obstructive sleep apnea 12/22/2009   Depression 08/14/2007   Anaplastic astrocytoma of frontal lobe (HCC) 01/10/2006   Past Medical History:  Past Medical History:  Diagnosis Date   Acute sinusitis, unspecified    Astrocytoma (HCC) 01/2006 /09/2006   grade 3 atrocytoma s/p chemo/radiation/surgery at [facility], in remission since at least 2011   Cancer [facility])    Brain    Diarrhea    Drug induced neutropenia(288.03)    Primary exertional headache    Recurrent depression (HCC)    Vaginitis    Past Surgical History:  Past Surgical History:  Procedure Laterality Date   BRAIN SURGERY     [facility].   d&c miscarriage  1993   left frontal craniotomy w/ left subtotal resection of left frontal brain tumor  01/10/2006   [facility] brain tumor surgery   01/13/2006   NSVD x 3 - premie at 22 wks     resection residual tumor  10/08/2006   Dr. Zachery Conch: [facility]   skull surg  2009/2010/2012   3 times past brain surg to close up opening   HPI:  62 y.o. F with history neurocytoma s/p resection in 2007, seizures, who presented with change in mentation, admitted to ICU for seizures, sepsis. Found to have MSSA pna (completed treatment), acute metabolic encephalopathy. Intubated 12/7-12/19  Per chart she had cognitive impairment and dementia and requires assistance for ADL's including feeding. With past medical history of astrocytoma s/p chemo, radiation, and multiple resections (2007), seizures, and depression   Assessment / Plan / Recommendation Clinical Impression  Pt was seen for speech-language-cognitive evaluation followed by bedside swallow 20 minutes later. During SLE, which was limited, pt was lethargic intermittently needing max cues to arouse- and shutting eyes towards end of assessment. Her respirations were shallow with a wet quality. She attempted to state her name but was aphonic with moving lips to approximate name however after significant amount of mucous suctioned from posterior oral cavity during swallow eval she was able to verbalize first name with significantly decreased vocal intensity. She followed one step commands with 66% accuracy and answered basic biographical orientation questions with 75% accuracy (not oriented to place). Attempted to repeat single word but with significant dysarthria was unable then began to fall asleep again. Daughter arrived at end of session and stated she sometimes needed help with cognition prior, she is "usually a quiet person" but that cognition was different from her baseline. ST will work with pt while on acute  care to maximize cognition, speech intelligibilty and communication. and will need therapy < 3 hours a day post acute care.    SLP Assessment  SLP Recommendation/Assessment: Patient needs continued  Speech Lanaguage Pathology Services SLP Visit Diagnosis: Cognitive communication deficit (R41.841)    Recommendations for follow up therapy are one component of a multi-disciplinary discharge planning process, led by the attending physician.  Recommendations may be updated based on patient status, additional functional criteria and insurance authorization.    Follow Up Recommendations  Skilled nursing-short term rehab (<3 hours/day)    Assistance Recommended at Discharge  Frequent or constant Supervision/Assistance  Functional Status Assessment Patient has had a recent decline in their functional status and demonstrates the ability to make significant improvements in function in a reasonable and predictable amount of time.  Frequency and Duration min 2x/week  2 weeks      SLP Evaluation Cognition  Overall Cognitive Status: Impaired/Different from baseline (daughter states cognition is different than baseline) Arousal/Alertness: Lethargic Orientation Level: Oriented to person;Disoriented to place (via yes/no question) Attention: Sustained Sustained Attention: Impaired Sustained Attention Impairment: Verbal basic Memory:  (TBA) Awareness:  (TBA) Problem Solving:  (TBA) Safety/Judgment: Impaired       Comprehension  Auditory Comprehension Overall Auditory Comprehension: Impaired Commands: Impaired One Step Basic Commands: 50-74% accurate (66%) Interfering Components: Attention Visual Recognition/Discrimination Discrimination: Not tested Reading Comprehension Reading Status: Not tested    Expression Expression Primary Mode of Expression: Other (comment) Verbal Expression Overall Verbal Expression: Impaired Initiation: No impairment Level of Generative/Spontaneous Verbalization: Word (whisper) Repetition:  (NT) Naming: Not tested Pragmatics: Impairment Impairments: Eye contact Interfering Components: Attention Written Expression Written Expression: Not tested   Oral /  Motor  Oral Motor/Sensory Function Overall Oral Motor/Sensory Function: Generalized oral weakness Motor Speech Overall Motor Speech: Impaired Respiration: Impaired Level of Impairment: Word (whisper) Phonation: Aphonic Articulation: Impaired Level of Impairment: Word Intelligibility: Intelligibility reduced Word: 0-24% accurate Motor Planning: Witnin functional limits Motor Speech Errors: Not applicable            Royce Macadamia 04/02/2023, 11:08 AM

## 2023-04-02 NOTE — Progress Notes (Signed)
Nutrition Follow-up  DOCUMENTATION CODES:   Not applicable  INTERVENTION:  Continue TF via Cortrak: Osmolite 1.2 at 12ml/hr ( per day) Provides: 1440 kcal, 67g protein, 984 ml free water daily   Banatrol TID-provides 45kcal, 5g soluble fiber and 2g protein per serving Vitamin C 500mg  daily x30 days and Zinc 220 mg x14 days per tube to support wound healing    NUTRITION DIAGNOSIS:   Inadequate oral intake related to acute illness as evidenced by NPO status.    GOAL:   Patient will meet greater than or equal to 90% of their needs    MONITOR:   Labs, Vent status, Weight trends, TF tolerance, Skin  REASON FOR ASSESSMENT:   Ventilator, Consult Enteral/tube feeding initiation and management  ASSESSMENT:   Pt admitted with metabolic encephalopathy secondary to sepsis and PNA. PMH significant for astrocytoma s/p chemo, radiation and surgery (2007) subsequent seizure disorder. Fecal management system removed 03/28/2023, continues with small bore feeding tube 10 Fr. Left nare. RN reports good tolerance to TF.   Good tolerance to feeding, Stools not as frequent although still loose. Pt daughter at bedside. Pt un able to arouse at this time.  No concerns noted.   Significant Hospital Events   12/7 admitted w/ sepsis and acute metabolic enceph w/ break thru seizures, PNA and possible meningitis started on vanc, ceftriaxone and azith also decadron  12/8 LP - non concerning meningitis, d/c decadron and vanc  12/11 -trach cx - MSSA, switched to ancef 12/14- completed abx course 12/17- family meeting with palliative team, planning for extubation 12/19 12/19- extubated, needed bipap overnight. Hospital weight history: 04/02/23 0500 55 kg 121.25 lbs  04/01/23 0352 50.4 kg 111.11 lbs  03/31/23 0500 49 kg 108.03 lbs  03/29/23 0709 48 kg 105.82 lbs  03/27/23 0500 45.4 kg 100.09 lbs  03/26/23 0710 47 kg 103.62 lbs  03/25/23 0215 47.5 kg 104.72 lbs  03/24/23 0500 48.8 kg  107.58 lbs  03/23/23 0500 50 kg 110.23 lbs  03/22/23 0500 50.8 kg 111.99 lbs  03/21/23 1410 54 kg 119.05 lbs  03/20/23 0500 49 kg 108.03 lbs      Nutritionally Relevant Medications: Scheduled Meds:   ascorbic acid  500 mg Per Tube Daily   fiber supplement (BANATROL TF)  60 mL Per Tube Q8H   thiamine  100 mg Per Tube Daily   zinc sulfate (50mg  elemental zinc)  220 mg Per Tube Daily    Continuous Infusions:  feeding supplement (OSMOLITE 1.2 CAL) 1,000 mL (04/01/23 1416)    Labs Reviewed: CBG ranges from 124-134 mg/dL over the last 24 hours HgbA1c 4.7    NUTRITION - FOCUSED PHYSICAL EXAM:  Flowsheet Row Most Recent Value  Orbital Region Unable to assess  Upper Arm Region Unable to assess  Thoracic and Lumbar Region No depletion  Buccal Region Unable to assess  Temple Region Moderate depletion  Clavicle Bone Region Unable to assess  [mild edema]  Clavicle and Acromion Bone Region Mild depletion  Scapular Bone Region Moderate depletion  Dorsal Hand Unable to assess  [severe edema]  Patellar Region Mild depletion  Anterior Thigh Region Mild depletion  Posterior Calf Region Unable to assess  [moderate pitting BLE]  Edema (RD Assessment) Moderate  [mild-moderate generalized edema,  severe BUE, moderate BLE]  Hair Reviewed  Eyes Reviewed  Mouth Unable to assess  Skin Other (Comment)  [pale]  Nails Unable to assess  [contracted]       Diet Order:   Diet Order  Diet NPO time specified  Diet effective now                   EDUCATION NEEDS:   No education needs have been identified at this time  Skin:  Skin Assessment: Skin Integrity Issues: Skin Integrity Issues:: Stage III, Unstageable Stage III: buttocks Unstageable: sacrum  Last BM:  12/23 type 7  Height:   Ht Readings from Last 1 Encounters:  03/14/23 5' (1.524 m)    Weight:   Wt Readings from Last 1 Encounters:  04/02/23 55 kg    Ideal Body Weight:  45.5 kg  BMI:  Body mass  index is 23.68 kg/m.  Estimated Nutritional Needs:   Kcal:  1400-1600  Protein:  65-80g  Fluid:  >/=1.5L    Jamelle Haring RDN, LDN Clinical Dietitian   If unable to reach, please contact "RD Inpatient" secure chat group between 8 am-4 pm daily"

## 2023-04-02 NOTE — Plan of Care (Signed)
  Problem: Education: Goal: Ability to describe self-care measures that may prevent or decrease complications (Diabetes Survival Skills Education) will improve Outcome: Not Progressing Goal: Individualized Educational Video(s) Outcome: Not Applicable   Problem: Coping: Goal: Ability to adjust to condition or change in health will improve Outcome: Progressing   Problem: Fluid Volume: Goal: Ability to maintain a balanced intake and output will improve Outcome: Progressing   Problem: Health Behavior/Discharge Planning: Goal: Ability to identify and utilize available resources and services will improve Outcome: Not Progressing Goal: Ability to manage health-related needs will improve Outcome: Not Progressing   Problem: Metabolic: Goal: Ability to maintain appropriate glucose levels will improve Outcome: Progressing   Problem: Nutritional: Goal: Maintenance of adequate nutrition will improve Outcome: Progressing Goal: Progress toward achieving an optimal weight will improve Outcome: Progressing   Problem: Skin Integrity: Goal: Risk for impaired skin integrity will decrease Outcome: Progressing   Problem: Respiratory: Goal: Ability to maintain a clear airway and adequate ventilation will improve Outcome: Progressing   Problem: Role Relationship: Goal: Method of communication will improve Outcome: Not Progressing   Problem: Education: Goal: Knowledge of General Education information will improve Description: Including pain rating scale, medication(s)/side effects and non-pharmacologic comfort measures Outcome: Progressing

## 2023-04-02 NOTE — Evaluation (Signed)
Clinical/Bedside Swallow Evaluation Patient Details  Name: Audrey Turner MRN: [id number] Date of Birth: 1960-06-10  Today's Date: 04/02/2023 Time: SLP Start Time (ACUTE ONLY): 1022 SLP Stop Time (ACUTE ONLY): 1033 SLP Time Calculation (min) (ACUTE ONLY): 11 min  Past Medical History:  Past Medical History:  Diagnosis Date   Acute sinusitis, unspecified    Astrocytoma (HCC) 01/2006 /09/2006   grade 3 atrocytoma s/p chemo/radiation/surgery at [facility], in remission since at least 2011   Cancer [facility])    Brain    Diarrhea    Drug induced neutropenia(288.03)    Primary exertional headache    Recurrent depression (HCC)    Vaginitis    Past Surgical History:  Past Surgical History:  Procedure Laterality Date   BRAIN SURGERY     [facility].   d&c miscarriage  1993   left frontal craniotomy w/ left subtotal resection of left frontal brain tumor  01/10/2006   [facility] brain tumor surgery  01/13/2006   NSVD x 3 - premie at 22 wks     resection residual tumor  10/08/2006   Dr. Zachery Conch: [facility]   skull surg  2009/2010/2012   3 times past brain surg to close up opening   HPI:  62 y.o. F with history neurocytoma s/p resection in 2007, seizures, who presented with change in mentation, admitted to ICU for seizures, sepsis. Found to have MSSA pna (completed treatment), acute metabolic encephalopathy. Intubated 12/7-12/19  Per chart she had cognitive impairment and dementia and requires assistance for ADL's including feeding. With past medical history of astrocytoma s/p chemo, radiation, and multiple resections (2007), seizures, and depression    Assessment / Plan / Recommendation  Clinical Impression  Pt seen for bedside swallow evaluation following speech-language-cognitive assessment. Pt's alertness waxes and wanes pt falling asleep prior during SLE. For swallow eval she was adequately awake and allowed oral care. At end of evaluation a significant amount of formed/dried mucous was  removed likely loosened from having po's. Unable to fully visualize dentition and she has general oromotor weakness. Pt was intubated for 13 days and has been extubated at time of assessment for 4 days. At baseline she has signficantly decreased respiratory effort, mostly aphonic with slight vocalization audible x 1  and inability to produce volitional cough. Oral transit delays with puree with subswallow suspicous for pharyngeal residue. Ice chip trial pt did attempt to masticate with multiple swallows. There were s/s aspiration with cup sip thin with immediate reflexive cough. Pt has Cortrak and with her intermittent ability to sustain alertness she is not appropriate for instrumental study at this time but would benefit once able to sustain alertness. ST can work on increasing pt's vocal intensity and volitional cough in therapy. SLP Visit Diagnosis: Dysphagia, unspecified (R13.10)    Aspiration Risk  Moderate aspiration risk    Diet Recommendation NPO    Medication Administration: Via alternative means    Other  Recommendations Oral Care Recommendations: Oral care QID    Recommendations for follow up therapy are one component of a multi-disciplinary discharge planning process, led by the attending physician.  Recommendations may be updated based on patient status, additional functional criteria and insurance authorization.  Follow up Recommendations Skilled nursing-short term rehab (<3 hours/day)      Assistance Recommended at Discharge Frequent or constant Supervision/Assistance  Functional Status Assessment Patient has had a recent decline in their functional status and demonstrates the ability to make significant improvements in function in a reasonable and predictable amount of time.  Frequency and Duration min 2x/week  2 weeks       Prognosis Prognosis for improved oropharyngeal function: Good      Swallow Study   General Date of Onset: 03/16/23 HPI: 62 y.o. F with history  neurocytoma s/p resection in 2007, seizures, who presented with change in mentation, admitted to ICU for seizures, sepsis. Found to have MSSA pna (completed treatment), acute metabolic encephalopathy. Intubated 12/7-12/19  Per chart she had cognitive impairment and dementia and requires assistance for ADL's including feeding. With past medical history of astrocytoma s/p chemo, radiation, and multiple resections (2007), seizures, and depression Type of Study: Bedside Swallow Evaluation Previous Swallow Assessment:  (none found) Diet Prior to this Study: NPO;Cortrak/Small bore NG tube Temperature Spikes Noted: No Respiratory Status: Room air History of Recent Intubation: Yes Total duration of intubation (days): 13 days Date extubated: 03/28/23 Behavior/Cognition: Cooperative;Lethargic/Drowsy Oral Cavity Assessment: Dry Oral Care Completed by SLP: Yes Oral Cavity - Dentition:  (will need to further assess as she is able to open mouth wider) Vision: Functional for self-feeding Self-Feeding Abilities: Total assist (appears to have degree of bil hand contractures) Patient Positioning: Upright in bed Baseline Vocal Quality: Aphonic Volitional Cough: Cognitively unable to elicit Volitional Swallow: Unable to elicit    Oral/Motor/Sensory Function Overall Oral Motor/Sensory Function: Generalized oral weakness Lingual ROM:  (decreased protrusion)   Ice Chips Ice chips: Impaired Presentation: Spoon Pharyngeal Phase Impairments: Multiple swallows   Thin Liquid Thin Liquid: Impaired Presentation: Spoon;Cup Pharyngeal  Phase Impairments: Cough - Immediate;Multiple swallows    Nectar Thick Nectar Thick Liquid: Not tested   Honey Thick Honey Thick Liquid: Not tested   Puree Puree: Impaired Presentation: Spoon Oral Phase Functional Implications: Prolonged oral transit Pharyngeal Phase Impairments: Multiple swallows   Solid     Solid: Not tested      Royce Macadamia 04/02/2023,11:38  AM

## 2023-04-02 NOTE — Progress Notes (Signed)
PT Cancellation Note  Patient Details Name: Shalay Heidel MRN: [id number] DOB: [birth year]   Cancelled Treatment:    Reason Eval/Treat Not Completed: Other (comment);Patient's level of consciousness (Pt is not following commands this morning per RN and currently SLP is in the room. Will continue to follow up as able and appropriate.)  Harrel Carina, DPT, CLT  Acute Rehabilitation Services Office: [phone] (Secure chat preferred)   Claudia Desanctis 04/02/2023, 11:28 AM

## 2023-04-02 NOTE — Plan of Care (Signed)
  Problem: Education: Goal: Ability to describe self-care measures that may prevent or decrease complications (Diabetes Survival Skills Education) will improve Outcome: Progressing   Problem: Coping: Goal: Ability to adjust to condition or change in health will improve Outcome: Progressing   Problem: Fluid Volume: Goal: Ability to maintain a balanced intake and output will improve Outcome: Progressing   Problem: Health Behavior/Discharge Planning: Goal: Ability to identify and utilize available resources and services will improve Outcome: Progressing Goal: Ability to manage health-related needs will improve Outcome: Progressing   Problem: Nutritional: Goal: Maintenance of adequate nutrition will improve Outcome: Progressing   Problem: Skin Integrity: Goal: Risk for impaired skin integrity will decrease Outcome: Progressing   Problem: Tissue Perfusion: Goal: Adequacy of tissue perfusion will improve Outcome: Progressing   Problem: Activity: Goal: Ability to tolerate increased activity will improve Outcome: Progressing

## 2023-04-02 NOTE — Progress Notes (Signed)
  Progress Note   Patient: Audrey Turner ZOX:[id number] DOB: Feb 25, 1961 DOA: 03/16/2023     17 DOS: the patient was seen and examined on 04/02/2023 at 11:20AM      Brief hospital course: 62 y.o. F with history neurocytoma s/p resection in 2007, seizures, who presented with change in mentation, admitted to ICU for seizures, sepsis.    Assessment and Plan: MSSA pneumonia Completed treatment   Acute respiratory failure with hypoxia Weaned to room air   Acute metabolic encephalopathy At baseline the patient is wheelchair-bound, has cognitive impairment and dementia, and requires assistance for ADLs including feeding.  She currently has delirium from her severe sepsis. No change in mentation, not alert enough to swallow. -Delirium precautions - Continue home amantadine -Continue tube feeds   Seizure disorder Controlled now - Continue Keppra   Depression -Hold Pristiq and Abilify   Stage III pressure injury, right buttock Unstageable pressure injury, sacrum Both present on admission - WOC           Subjective: No change. Patient nonverbal.  No nursing concerns.         Physical Exam: BP 122/60 (BP Location: Right Arm)   Pulse 94   Temp 98.3 F (36.8 C) (Oral)   Resp 20   Wt 55 kg   SpO2 100%   BMI 23.68 kg/m   Chronically ill-appearing adult female, lying in bed, appears weak and tired RRR, no murmurs, no peripheral edema Respiratory rate normal, lungs clear without rales or wheezes Abdomen soft, no grimace to palpation Contractured on the right side, able to get my hand to squeeze on the left, can follow commands to wiggle toes on the left, not on the right, unable to make any verbalizations, briefly makes eye contact, then closes her eyes       Data Reviewed: Basic metabolic panel shows elevated BUN, stable mild transaminitis, low albumin/hypoalbuminemia CBC shows stable anemia      Family Communication: daughter at bedside, brother POA  at bedside    Disposition: Status is: Inpatient         Author: Alberteen Sam, MD 04/02/2023 4:01 PM  For on call review [email or web address].

## 2023-04-03 ENCOUNTER — Inpatient Hospital Stay (HOSPITAL_COMMUNITY): Payer: Medicare Other

## 2023-04-03 DIAGNOSIS — R627 Adult failure to thrive: Secondary | ICD-10-CM

## 2023-04-03 LAB — CBC
HCT: 26 % — ABNORMAL LOW (ref 36.0–46.0)
Hemoglobin: 8.4 g/dL — ABNORMAL LOW (ref 12.0–15.0)
MCH: 29.3 pg (ref 26.0–34.0)
MCHC: 32.3 g/dL (ref 30.0–36.0)
MCV: 90.6 fL (ref 80.0–100.0)
Platelets: 330 10*3/uL (ref 150–400)
RBC: 2.87 MIL/uL — ABNORMAL LOW (ref 3.87–5.11)
RDW: 16.6 % — ABNORMAL HIGH (ref 11.5–15.5)
WBC: 13.7 10*3/uL — ABNORMAL HIGH (ref 4.0–10.5)
nRBC: 0.2 % (ref 0.0–0.2)

## 2023-04-03 LAB — GLUCOSE, CAPILLARY
Glucose-Capillary: 103 mg/dL — ABNORMAL HIGH (ref 70–99)
Glucose-Capillary: 53 mg/dL — ABNORMAL LOW (ref 70–99)
Glucose-Capillary: 52 mg/dL — ABNORMAL LOW (ref 70–99)
Glucose-Capillary: 161 mg/dL — ABNORMAL HIGH (ref 70–99)
Glucose-Capillary: 69 mg/dL — ABNORMAL LOW (ref 70–99)
Glucose-Capillary: 29 mg/dL — CL (ref 70–99)
Glucose-Capillary: 97 mg/dL (ref 70–99)
Glucose-Capillary: 69 mg/dL — ABNORMAL LOW (ref 70–99)
Glucose-Capillary: 69 mg/dL — ABNORMAL LOW (ref 70–99)

## 2023-04-03 LAB — BASIC METABOLIC PANEL
Anion gap: 7 (ref 5–15)
BUN: 35 mg/dL — ABNORMAL HIGH (ref 6–20)
CO2: 27 mmol/L (ref 22–32)
Calcium: 7.8 mg/dL — ABNORMAL LOW (ref 8.9–10.3)
Chloride: 102 mmol/L (ref 98–111)
Creatinine, Ser: 0.57 mg/dL (ref 0.44–1.00)
GFR, Estimated: >60 mL/min (ref >60)
Glucose, Bld: 109 mg/dL — ABNORMAL HIGH (ref 70–99)
Potassium: 4.3 mmol/L (ref 3.5–5.1)
Sodium: 136 mmol/L (ref 135–145)

## 2023-04-03 LAB — GLUCOSE, RANDOM: Glucose, Bld: 145 mg/dL — ABNORMAL HIGH (ref 70–99)

## 2023-04-03 MED ORDER — DEXTROSE 50 % IV SOLN
INTRAVENOUS | Status: AC
  Administered 2023-04-03: 25 g via INTRAVENOUS
  Filled 2023-04-03: qty 50

## 2023-04-03 MED ORDER — DEXTROSE 50 % IV SOLN: 25.0000 g | Freq: Once | INTRAVENOUS | Status: AC

## 2023-04-03 MED ORDER — DEXTROSE 10 % IV SOLN: INTRAVENOUS | Status: DC

## 2023-04-03 MED ORDER — DEXTROSE 50 % IV SOLN
INTRAVENOUS | Status: AC
  Administered 2023-04-03: 25 mL
  Filled 2023-04-03: qty 50

## 2023-04-03 MED ORDER — DEXTROSE 5 % IV SOLN: INTRAVENOUS | Status: DC

## 2023-04-03 NOTE — Plan of Care (Signed)
  Problem: Fluid Volume: Goal: Ability to maintain a balanced intake and output will improve Outcome: Progressing   Problem: Nutritional: Goal: Progress toward achieving an optimal weight will improve Outcome: Progressing   Problem: Skin Integrity: Goal: Risk for impaired skin integrity will decrease Outcome: Progressing   Problem: Health Behavior/Discharge Planning: Goal: Ability to identify and utilize available resources and services will improve Outcome: Not Progressing Goal: Ability to manage health-related needs will improve Outcome: Not Progressing   Problem: Metabolic: Goal: Ability to maintain appropriate glucose levels will improve Outcome: Not Progressing   Problem: Tissue Perfusion: Goal: Adequacy of tissue perfusion will improve Outcome: Not Progressing   Problem: Activity: Goal: Ability to tolerate increased activity will improve Outcome: Not Progressing

## 2023-04-03 NOTE — Progress Notes (Signed)
Hypoglycemic Event  CBG: 69  Treatment: D50 25 mL (12.5 gm)  Symptoms: None  Follow-up CBG: Time:1250 CBG Result:145 (lab draw)  Possible Reasons for Event: Unknown  Comments/MD notified:Dr. Danford aware rechecked with lab draw     Dortha Kern

## 2023-04-03 NOTE — Progress Notes (Signed)
  Hypoglycemic Event  CBG: 53  Treatment: D50 50 mL (25 gm)  Symptoms: None  Follow-up CBG: Time:0120 CBG Result:106  Possible Reasons for Event: Unknown  Comments/MD notified:     Marlinda Mike

## 2023-04-03 NOTE — Progress Notes (Signed)
  Progress Note   Patient: Audrey Turner [id number]:[id number] DOB: 26-Apr-1960 DOA: 03/16/2023     18 DOS: the patient was seen and examined on 04/03/2023 at 11:01AM      Brief hospital course: 62 y.o. F with history neurocytoma s/p resection in 2007, seizures, who presented with change in mentation, admitted to ICU for seizures, sepsis.    Assessment and Plan: MSSA pneumonia Completed treatment   Acute respiratory failure with hypoxia Weaned to room air   Acute metabolic encephalopathy At baseline the patient is wheelchair-bound, has cognitive impairment and dementia, and requires assistance for ADLs including feeding.  She currently has delirium from her severe sepsis. No change in mentation, not alert enough to swallow. -Delirium precautions - Continue home amantadine -Continue tube feeds  - Stop accuchecks, glucoses in serum have been normal  Seizure disorder Controlled now - Continue Keppra   Depression -Hold Pristiq and Abilify   Stage III pressure injury, right buttock Unstageable pressure injury, sacrum Both present on admission - WOC           Subjective: No change. Patient nonverbal.  No nursing concerns.         Physical Exam: BP (!) 115/57 (BP Location: Right Arm)   Pulse 97   Temp 98.2 F (36.8 C) (Axillary)   Resp 19   Wt 55 kg   SpO2 100%   BMI 23.68 kg/m   Chronically ill-appearing adult female, lying in bed, appears weak and tired RRR, no murmurs, no peripheral edema Respiratory rate normal, lungs clear without rales or wheezes Abdomen soft, no grimace to palpation Contractured on the right side, able to get my hand to squeeze on the left, can follow commands to wiggle toes on the left, not on the right, unable to make any verbalizations, briefly makes eye contact, then closes her eyes       Data Reviewed: Accuchecks overnight low, twice did not correlate with serum glucose.  Accuchecks not accurte, these have been stopped.  Her  serum glucoses have consistently been normal   Family Communication: daghter at bedside    Disposition: Status is: Inpatient         Author: Alberteen Sam, MD 04/03/2023 3:53 PM  For on call review [email or web address].

## 2023-04-03 NOTE — Progress Notes (Signed)
Hypoglycemic Event  CBG: 29  Treatment: Pt started on d5 @40  ml/HR and rechecked   Symptoms: None  Follow-up CBG: Time:0821 CBG Result:97  Possible Reasons for Event: Unknown  Comments/MD notified:Dr. Danford aware     Selena Batten Damary Doland

## 2023-04-03 NOTE — Progress Notes (Signed)
Hypoglycemic Event  CBG: 52 Repeat CBG: 69  Treatment: D50 50 mL (25 gm)  Symptoms: None  Follow-up CBG: Time:0513 CBG Result:161  Possible Reasons for Event: Unknown  Comments/MD notified: Dr. Julian Reil made aware.   RN notified of Low CBG. RN Rechecked CBG and drew pt's scheduled BMP and sent to lab prior to giving D50. MD notified.     Marlinda Mike

## 2023-04-03 NOTE — Progress Notes (Signed)
Daily Progress Note   Patient Name: Audrey Turner       Date: 04/03/2023 DOB: 05-02-1960  Age: 62 y.o. MRN#: [id number] Attending Physician: Alberteen Sam, * Primary Care Physician: Joaquim Nam, MD Admit Date: 03/16/2023  Reason for Consultation/Follow-up: Establishing goals of care  Patient Profile/HPI:     62 y.o. female  with past medical history of astrocytoma s/p chemo, radiation, and multiple resections (2007), seizures, and depression admitted on 03/16/2023 with altered mental status.    Acute metabolic encephalopathy felt to be secondary to sepsis from pneumonia and status epilepticus. She was intubated for airway protection. She has now been off sedation for several days with limited improvement in mental status.   Patient faces treatment option decisions, advanced directive decisions and anticipatory care needs.  Subjective: Chart reviewed including labs, progress notes, imaging from this and previous encounters.  Patient resting- no family at bedside. Does not respond verbally to my voice.  Discussed with attending. Plan for likely PEG tube this week. Will likely need long term care facility.  Review of Systems  Unable to perform ROS: Acuity of condition     Physical Exam Vitals and nursing note reviewed.  Constitutional:      Appearance: She is ill-appearing.     Comments: frail  HENT:     Nose:     Comments: Cortrak in place Skin:    Coloration: Skin is pale.  Neurological:     Comments: nonverbal             Vital Signs: BP (!) 115/57 (BP Location: Right Arm)   Pulse 97   Temp 98.2 F (36.8 C) (Axillary)   Resp 19   Wt 55 kg   SpO2 100%   BMI 23.68 kg/m  SpO2: SpO2: 100 % O2 Device: O2 Device: Room Air O2 Flow Rate: O2 Flow  Rate (L/min): 3 L/min  Intake/output summary:  Intake/Output Summary (Last 24 hours) at 04/03/2023 1218 Last data filed at 04/03/2023 0630 Gross per 24 hour  Intake 1650 ml  Output 800 ml  Net 850 ml   LBM: Last BM Date : 04/03/23 Baseline Weight: Weight:  (scale malfunctioning) Most recent weight: Weight: 55 kg       Palliative Assessment/Data: PPS: 20% (with cortrak)      Patient Active Problem  List   Diagnosis Date Noted   Altered mental status 03/27/2023   Pressure injury of skin 03/25/2023   Acute respiratory failure with hypoxia (HCC) 03/17/2023   URI (upper respiratory infection) 03/17/2023   Sepsis (HCC) 03/16/2023   Epistaxis 10/15/2021   Cold extremities 06/07/2021   Insomnia 01/22/2021   Healthcare maintenance 09/08/2019   Contracture of hand 09/08/2019   Urinary incontinence 06/12/2019   Medicare annual wellness visit, subsequent 06/11/2018   Lower extremity edema 06/11/2018   HLD (hyperlipidemia) 05/13/2017   Localization-related symptomatic epilepsy and epileptic syndromes with complex partial seizures, not intractable, without status epilepticus (HCC) 07/17/2016   Hx of astrocytoma 07/17/2016   Hypoxia, sleep related 07/03/2016   Convulsions/seizures (HCC) 12/14/2015   Advance care planning 12/13/2015   Muscular deconditioning 11/10/2015   Hyperglycemia 03/12/2013   Obstructive sleep apnea 12/22/2009   Depression 08/14/2007   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  Total time: 25 minutes Prolonged billing:  Time includes:   Preparing to see the  patient (e.g., review of tests) Obtaining and/or reviewing separately obtained history Performing a medically necessary appropriate examination and/or evaluation Counseling and educating the patient/family/caregiver Ordering medications, tests, or procedures Referring and communicating with other health care professionals (when not reported separately) Documenting clinical information in the electronic or other health record Independently interpreting results (not reported separately) and communicating results to the patient/family/caregiver Care coordination (not reported separately) Clinical documentation  Ocie Bob, AGNP-C Palliative Medicine   Please contact Palliative Medicine Team phone at [phone] for questions and concerns.

## 2023-04-04 DIAGNOSIS — F03C0 Unspecified dementia, severe, without behavioral disturbance, psychotic disturbance, mood disturbance, and anxiety: Secondary | ICD-10-CM

## 2023-04-04 LAB — COMPREHENSIVE METABOLIC PANEL
ALT: 34 U/L (ref 0–44)
AST: 33 U/L (ref 15–41)
Albumin: <1.5 g/dL — ABNORMAL LOW (ref 3.5–5.0)
Alkaline Phosphatase: 108 U/L (ref 38–126)
Anion gap: 5 (ref 5–15)
BUN: 25 mg/dL — ABNORMAL HIGH (ref 6–20)
CO2: 25 mmol/L (ref 22–32)
Calcium: 6.8 mg/dL — ABNORMAL LOW (ref 8.9–10.3)
Chloride: 110 mmol/L (ref 98–111)
Creatinine, Ser: 0.45 mg/dL (ref 0.44–1.00)
GFR, Estimated: >60 mL/min (ref >60)
Glucose, Bld: 98 mg/dL (ref 70–99)
Potassium: 4 mmol/L (ref 3.5–5.1)
Sodium: 140 mmol/L (ref 135–145)
Total Bilirubin: 0.9 mg/dL (ref <1.2)
Total Protein: 4 g/dL — ABNORMAL LOW (ref 6.5–8.1)

## 2023-04-04 LAB — CBC
HCT: 25.7 % — ABNORMAL LOW (ref 36.0–46.0)
Hemoglobin: 8.2 g/dL — ABNORMAL LOW (ref 12.0–15.0)
MCH: 29 pg (ref 26.0–34.0)
MCHC: 31.9 g/dL (ref 30.0–36.0)
MCV: 90.8 fL (ref 80.0–100.0)
Platelets: 364 10*3/uL (ref 150–400)
RBC: 2.83 MIL/uL — ABNORMAL LOW (ref 3.87–5.11)
RDW: 15.9 % — ABNORMAL HIGH (ref 11.5–15.5)
WBC: 13.1 10*3/uL — ABNORMAL HIGH (ref 4.0–10.5)
nRBC: 0 % (ref 0.0–0.2)

## 2023-04-04 NOTE — Progress Notes (Signed)
  Progress Note   Patient: Audrey Turner WUJ:[id number] DOB: 13-Oct-1960 DOA: 03/16/2023     19 DOS: the patient was seen and examined on 04/04/2023 at 10:15AM      Brief hospital course: 62 y.o. F with history neurocytoma s/p resection in 2007, seizures, who presented with change in mentation, admitted to ICU for seizures, sepsis.    Assessment and Plan: MSSA pneumonia Completed treatment   Acute respiratory failure with hypoxia Weaned to room air   Acute metabolic encephalopathy At baseline the patient is wheelchair-bound, has cognitive impairment and dementia, and requires assistance for ADLs including feeding.  She currently has delirium from her severe sepsis. No change in mentation, not alert enough to swallow. -Delirium precautions - Continue home amantadine -Continue tube feeds - Consult IR for PEG tube   Seizure disorder Controlled now - Continue Keppra   Depression -Hold Pristiq and Abilify   Stage III pressure injury, right buttock Unstageable pressure injury, sacrum Both present on admission - WOC           Subjective: No change. Patient nonverbal.  No nursing concerns.         Physical Exam: BP (!) 113/57 (BP Location: Right Arm)   Pulse 95   Temp 98.1 F (36.7 C) (Axillary)   Resp 20   Wt 53 kg   SpO2 100%   BMI 22.82 kg/m   Chronically ill-appearing adult female, lying in bed, appears weak and tired RRR, no murmurs, no peripheral edema Respiratory rate normal, lungs clear without rales or wheezes Abdomen soft, no grimace to palpation Contractured on the right side, able to get my hand to squeeze on the left, can follow commands to wiggle toes on the left, not on the right, unable to make any verbalizations, briefly makes eye contact, then closes her eyes       Data Reviewed: Blood cell count up to 13 Hemoglobin 8.2, no change Sodium, potassium, creatinine normal, BUN down to 25, glucose normal   Disposition: Status is:  Inpatient         Author: Alberteen Sam, MD 04/04/2023 2:30 PM  For on call review [email or web address].

## 2023-04-04 NOTE — Plan of Care (Signed)
  Problem: Coping: Goal: Ability to adjust to condition or change in health will improve Outcome: Not Progressing   Problem: Fluid Volume: Goal: Ability to maintain a balanced intake and output will improve Outcome: Not Progressing   Problem: Nutritional: Goal: Maintenance of adequate nutrition will improve Outcome: Not Progressing   Problem: Skin Integrity: Goal: Risk for impaired skin integrity will decrease Outcome: Not Progressing

## 2023-04-04 NOTE — TOC Initial Note (Signed)
Transition of Care (TOC) - Initial/Assessment Note   Transition of Care [facility]) - Inpatient Brief Assessment   Patient Details  Name: Audrey Turner MRN: [id number] Date of Birth: [birth year]  Transition of Care [facility]) CM/SW Contact:    Marliss Coots, LCSW Phone Number: 04/04/2023, 4:22 PM   Clinical Narrative:  4:22 PM Per chart review, patient's family is to have a goals of care meeting this weekend with palliative care regarding comfort care. TOC will continue to follow and be available as needed.   Transition of Care Asessment: Insurance and Status: Insurance coverage has been reviewed Patient has primary care physician: Yes Home environment has been reviewed: Private Residence Prior level of function:: Needs Assistance Prior/Current Home Services: No current home services Social Drivers of Health Review: SDOH reviewed no interventions necessary Readmission risk has been reviewed: Yes Transition of care needs: transition of care needs identified, TOC will continue to follow   Activities of Daily Living   ADL Screening (condition at time of admission) Independently performs ADLs?: No Does the patient have a NEW difficulty with bathing/dressing/toileting/self-feeding that is expected to last >3 days?: Yes (Initiates electronic notice to provider for possible OT consult) Does the patient have a NEW difficulty with getting in/out of bed, walking, or climbing stairs that is expected to last >3 days?: Yes (Initiates electronic notice to provider for possible PT consult) Does the patient have a NEW difficulty with communication that is expected to last >3 days?: Yes (Initiates electronic notice to provider for possible SLP consult) Is the patient deaf or have difficulty hearing?: No Does the patient have difficulty seeing, even when wearing glasses/contacts?: Yes Does the patient have difficulty concentrating, remembering, or making decisions?: Yes  Permission  Sought/Granted                  Emotional Assessment              Admission diagnosis:  Hypernatremia [E87.0] Sepsis (HCC) [A41.9] Altered mental status, unspecified altered mental status type [R41.82] Sepsis, due to unspecified organism, unspecified whether acute organ dysfunction present [facility]) [A41.9] Patient Active Problem List   Diagnosis Date Noted   Altered mental status 03/27/2023   Pressure injury of skin 03/25/2023   Acute respiratory failure with hypoxia (HCC) 03/17/2023   URI (upper respiratory infection) 03/17/2023   Sepsis (HCC) 03/16/2023   Epistaxis 10/15/2021   Cold extremities 06/07/2021   Insomnia 01/22/2021   Healthcare maintenance 09/08/2019   Contracture of hand 09/08/2019   Urinary incontinence 06/12/2019   Medicare annual wellness visit, subsequent 06/11/2018   Lower extremity edema 06/11/2018   HLD (hyperlipidemia) 05/13/2017   Localization-related symptomatic epilepsy and epileptic syndromes with complex partial seizures, not intractable, without status epilepticus (HCC) 07/17/2016   Hx of astrocytoma 07/17/2016   Hypoxia, sleep related 07/03/2016   Convulsions/seizures (HCC) 12/14/2015   Advance care planning 12/13/2015   Muscular deconditioning 11/10/2015   Hyperglycemia 03/12/2013   Obstructive sleep apnea 12/22/2009   Depression 08/14/2007   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 [place] [address] Phone: [phone] Fax: [phone]  [id number] - [place],  - [address] AT [address] [place] [address] Phone: [phone] Fax: [phone]  Social Drivers of Health (SDOH) Social  History: SDOH Screenings   Food Insecurity: No Food Insecurity (05/23/2021)  Housing: Low Risk  (05/23/2021)  Transportation Needs: No Transportation Needs (05/23/2021)  Alcohol Screen: Low Risk  (05/23/2021)  Depression (PHQ2-9): Low Risk  (05/23/2021)  Financial Resource Strain: Low Risk  (05/23/2021)  Physical Activity: Inactive (05/23/2021)  Social Connections: Moderately Isolated (05/23/2021)  Stress: No Stress Concern Present (05/23/2021)  Tobacco Use: Low Risk  (03/30/2023)   SDOH Interventions:     Readmission Risk Interventions     No data to display

## 2023-04-04 NOTE — Progress Notes (Signed)
Daily Progress Note   Patient Name: Audrey Turner       Date: 04/04/2023 DOB: 09/21/1960  Age: 62 y.o. MRN#: [id number] Attending Physician: Alberteen Sam, * Primary Care Physician: Joaquim Nam, MD Admit Date: 03/16/2023  Reason for Consultation/Follow-up: Establishing goals of care  Patient Profile/HPI:     62 y.o. female  with past medical history of astrocytoma s/p chemo, radiation, and multiple resections (2007), seizures, and depression admitted on 03/16/2023 with altered mental status.  Workup revealed acute metabolic encephalopathy felt to be secondary to sepsis from pneumonia and status epilepticus. She was intubated for airway protection. She has now been off sedation for several days with limited improvement in mental status. She was extubated 12/19, however mental status remains poor. Very lethargic and unable to complete swallow evaluation- cortrak in place.   Subjective: Chart reviewed including labs, progress notes, imaging from this and previous encounters.  Audrey Turner was able to accept a few ice chips today. On evaluation she has some unmanaged secretions. She is awake, but does not respond verbally.  Met in conference with her daughter Audrey Turner and brother, Audrey Turner, and Sarah's husband, Audrey Turner. Alex Gardener, NP with Palliative was also present.   We discussed her continued poor mental status and difficulty taking in anything by mouth.  Pathways of continued aggressive medical interventions including PEG tube and likely facility care vs comfort focused path reviewed.  Discussed transition to comfort measures only which includes stopping IV fluids, antibiotics, labs and providing symptom management for SOB, anxiety, nausea, vomiting, and other symptoms of dying.   Family asked appropriate questions. We discussed the impact of acute illness on an already decompensated/debilitated person.  Reviewed it is unlikely that Audrey Turner would be able to return to her level of function that she was prior to this admission. Audrey Turner stated he had hoped that feeding tube would help her "get better". His definition of "get better" is that she will be able to speak and to eat. I gave my honest assessment that it is unlikely she will return to a state where she will be able to support her body without artificial nutrition via tube feeding.  We discussed the role of nutrition and hydration at end of life.  Family notes that patient would not want "life support". We discussed that a feeding tube  is a form of artificial life support.  Audrey Turner and Audrey Turner agree that further discussion needs to be had with other family members prior to making any decisions.  Encouraged family to continue medical decision making discussions with providers considering the context of what patient would choose for themselves based on likely outcomes.    Review of Systems  Unable to perform ROS: Acuity of condition     Physical Exam Vitals and nursing note reviewed.  Constitutional:      Appearance: She is ill-appearing.     Comments: frail  HENT:     Nose:     Comments: Cortrak in place Skin:    Coloration: Skin is pale.  Neurological:     Comments: nonverbal             Vital Signs: BP (!) 113/57 (BP Location: Right Arm)   Pulse 95   Temp 98.1 F (36.7 C) (Axillary)   Resp 18   Wt 53 kg   SpO2 100%   BMI 22.82 kg/m  SpO2: SpO2: 100 % O2 Device: O2 Device: Room Air O2 Flow Rate: O2 Flow Rate (L/min): 3 L/min  Intake/output summary:  Intake/Output Summary (Last 24 hours) at 04/04/2023 1441 Last data filed at 04/04/2023 1130 Gross per 24 hour  Intake 2074.44 ml  Output 1650 ml  Net 424.44 ml   LBM: Last BM Date : 04/03/23 Baseline Weight: Weight:  (scale malfunctioning) Most  recent weight: Weight: 53 kg       Palliative Assessment/Data: PPS: 20% (with cortrak)      Patient Active Problem List   Diagnosis Date Noted   Altered mental status 03/27/2023   Pressure injury of skin 03/25/2023   Acute respiratory failure with hypoxia (HCC) 03/17/2023   URI (upper respiratory infection) 03/17/2023   Sepsis (HCC) 03/16/2023   Epistaxis 10/15/2021   Cold extremities 06/07/2021   Insomnia 01/22/2021   Healthcare maintenance 09/08/2019   Contracture of hand 09/08/2019   Urinary incontinence 06/12/2019   Medicare annual wellness visit, subsequent 06/11/2018   Lower extremity edema 06/11/2018   HLD (hyperlipidemia) 05/13/2017   Localization-related symptomatic epilepsy and epileptic syndromes with complex partial seizures, not intractable, without status epilepticus (HCC) 07/17/2016   Hx of astrocytoma 07/17/2016   Hypoxia, sleep related 07/03/2016   Convulsions/seizures (HCC) 12/14/2015   Advance care planning 12/13/2015   Muscular deconditioning 11/10/2015   Hyperglycemia 03/12/2013   Obstructive sleep apnea 12/22/2009   Depression 08/14/2007   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  Total time:  75 minutes Prolonged billing:  Time includes:   Preparing to see the patient (e.g., review of tests) Obtaining and/or reviewing separately obtained history Performing a medically necessary appropriate examination and/or evaluation Counseling and educating the patient/family/caregiver Ordering  medications, tests, or procedures Referring and communicating with other health care professionals (when not reported separately) Documenting clinical information in the electronic or other health record Independently interpreting results (not reported separately) and communicating results to the patient/family/caregiver Care coordination (not reported separately) Clinical documentation  Ocie Bob,  AGNP-C Palliative Medicine   Please contact Palliative Medicine Team phone at [phone] for questions and concerns.

## 2023-04-04 NOTE — Plan of Care (Signed)
Unable to determine patient level of orientation upon admission. Patient is nonverbal and NG tube remains in place. Patient able to tolerate osmolite 1.2 running at 50 mL/hr with 100 flushes Q4 hours. Patient sacral dressing remains intact and patient turned Q2 hours. Patient skin assessed with Freida Busman, RN. Stage 4 sacral wound noted on patient buttocks. Patient remains in low air loss bed, VSS, will continue to monitor. Family and oral suction present at bedside.  Problem: Education: Goal: Ability to describe self-care measures that may prevent or decrease complications (Diabetes Survival Skills Education) will improve Outcome: Progressing   Problem: Coping: Goal: Ability to adjust to condition or change in health will improve Outcome: Progressing   Problem: Fluid Volume: Goal: Ability to maintain a balanced intake and output will improve Outcome: Progressing   Problem: Health Behavior/Discharge Planning: Goal: Ability to identify and utilize available resources and services will improve Outcome: Progressing   Problem: Health Behavior/Discharge Planning: Goal: Ability to manage health-related needs will improve Outcome: Progressing   Problem: Metabolic: Goal: Ability to maintain appropriate glucose levels will improve Outcome: Progressing   Problem: Nutritional: Goal: Maintenance of adequate nutrition will improve Outcome: Progressing   Problem: Nutritional: Goal: Progress toward achieving an optimal weight will improve Outcome: Progressing   Problem: Skin Integrity: Goal: Risk for impaired skin integrity will decrease Outcome: Progressing   Problem: Tissue Perfusion: Goal: Adequacy of tissue perfusion will improve Outcome: Progressing   Problem: Respiratory: Goal: Ability to maintain a clear airway and adequate ventilation will improve Outcome: Progressing   Problem: Role Relationship: Goal: Method of communication will improve Outcome: Progressing   Problem:  Education: Goal: Knowledge of General Education information will improve Description: Including pain rating scale, medication(s)/side effects and non-pharmacologic comfort measures Outcome: Progressing   Problem: Health Behavior/Discharge Planning: Goal: Ability to manage health-related needs will improve Outcome: Progressing   Problem: Clinical Measurements: Goal: Ability to maintain clinical measurements within normal limits will improve Outcome: Progressing   Problem: Clinical Measurements: Goal: Will remain free from infection Outcome: Progressing   Problem: Clinical Measurements: Goal: Diagnostic test results will improve Outcome: Progressing   Problem: Clinical Measurements: Goal: Respiratory complications will improve Outcome: Progressing   Problem: Clinical Measurements: Goal: Cardiovascular complication will be avoided Outcome: Progressing   Problem: Activity: Goal: Risk for activity intolerance will decrease Outcome: Progressing   Problem: Nutrition: Goal: Adequate nutrition will be maintained Outcome: Progressing   Problem: Coping: Goal: Level of anxiety will decrease Outcome: Progressing   Problem: Elimination: Goal: Will not experience complications related to bowel motility Outcome: Progressing   Problem: Elimination: Goal: Will not experience complications related to urinary retention Outcome: Progressing   Problem: Pain Management: Goal: General experience of comfort will improve Outcome: Progressing   Problem: Safety: Goal: Ability to remain free from injury will improve Outcome: Progressing   Problem: Skin Integrity: Goal: Risk for impaired skin integrity will decrease Outcome: Progressing

## 2023-04-04 NOTE — Progress Notes (Signed)
Physical Therapy Treatment Patient Details Name: Arley Porath MRN: [id number] DOB: [birth year] Today's Date: 04/04/2023   History of Present Illness 62 y.o. female admitted 12/7 with altered mental status, sepsis, metabolic encephalopathy, breakthrough seizures and Intubated. 12/19 extubated, weaned off BIPAP. With past medical history of astrocytoma s/p chemo, radiation, and multiple resections (2007), seizures, and depression    PT Comments  Very slow progress towards goals. Pt was able to perform there-ex sitting EOB this session with 2x LAQ then 3x LAQ, ankle mobility sitting EOB with very limited ROM demonstrating less than 3/5 strength in the LLE. Pt was total assist for supine<>sitting and unable to progress to tranfers. Due to pt current functional status, home set up and available assistance at home recommending skilled physical therapy services < 3 hours/day in order to address strength, balance and functional mobility to decrease risk for falls, injury, immobility, skin break down and re-hospitalization.      If plan is discharge home, recommend the following: Two people to help with walking and/or transfers;Assistance with cooking/housework;Assist for transportation;Help with stairs or ramp for entrance;Supervision due to cognitive status   Can travel by private vehicle     No  Equipment Recommendations  Other (comment) (defer to post acute)       Precautions / Restrictions Precautions Precautions: Fall Precaution Comments: Monitor vitals closely, esp RR and sats Restrictions Weight Bearing Restrictions Per Provider Order: No     Mobility  Bed Mobility Overal bed mobility: Needs Assistance Bed Mobility: Supine to Sit, Sit to Supine     Supine to sit: Total assist, +2 for physical assistance Sit to supine: +2 for physical assistance, Total assist   General bed mobility comments: Pt was able to help less than 25% with LE toward EOB. Pt was Total A for  helicopter approach to get sitting EOB. Pt was able to sit EOB for ~10 min working on simple ther-ex and weight shifting.    Transfers     General transfer comment: deferred    Ambulation/Gait     General Gait Details: unable at this time.        Balance Overall balance assessment: Needs assistance Sitting-balance support: Single extremity supported Sitting balance-Leahy Scale: Poor Sitting balance - Comments: Pt was intermittenly able to help support wgt in sitting EOB with LLE with heavy multi modal cueing. Pt was able to go to Max A +2 sitting EOB to Min A sitting EOB.          Cognition Arousal: Alert Behavior During Therapy: Flat affect Overall Cognitive Status: Impaired/Different from baseline             General Comments General comments (skin integrity, edema, etc.): Pt HR and respiratory reight remained WNL throughout session.      Pertinent Vitals/Pain Pain Assessment Pain Assessment: Faces Faces Pain Scale: No hurt Breathing: normal Negative Vocalization: none Facial Expression: smiling or inexpressive Body Language: relaxed Consolability: no need to console PAINAD Score: 0     PT Goals (current goals can now be found in the care plan section) Acute Rehab PT Goals Patient Stated Goal: none stated PT Goal Formulation: With family Time For Goal Achievement: 04/12/23 Potential to Achieve Goals: Poor Progress towards PT goals: Progressing toward goals    Frequency    Min 1X/week      PT Plan  Harrel Carina, DPT, CLT  Acute Rehabilitation Services Office: [phone] (Secure chat preferred)        AM-PAC PT "6 Clicks" Mobility  Outcome Measure  Help needed turning from your back to your side while in a flat bed without using bedrails?: Total Help needed moving from lying on your back to sitting on the side of a flat bed without using bedrails?: Total Help needed moving to and from a bed to a chair (including a wheelchair)?:  Total Help needed standing up from a chair using your arms (e.g., wheelchair or bedside chair)?: Total Help needed to walk in hospital room?: Total Help needed climbing 3-5 steps with a railing? : Total 6 Click Score: 6    End of Session   Activity Tolerance: Patient tolerated treatment well Patient left: in bed;with call bell/phone within reach;with bed alarm set;with SCD's reapplied Nurse Communication: Mobility status PT Visit Diagnosis: Muscle weakness (generalized) (M62.81);Difficulty in walking, not elsewhere classified (R26.2);Other symptoms and signs involving the nervous system (R29.898)     Time: 1201-1224 PT Time Calculation (min) (ACUTE ONLY): 23 min  Charges:    $Therapeutic Activity: 23-37 mins PT General Charges $$ ACUTE PT VISIT: 1 Visit                     Harrel Carina, DPT, CLT  Acute Rehabilitation Services Office: [phone] (Secure chat preferred)    Claudia Desanctis 04/04/2023, 1:33 PM

## 2023-04-04 NOTE — Progress Notes (Signed)
Speech Language Pathology Treatment: Dysphagia  Patient Details Name: Audrey Turner MRN: [id number] DOB: [birth year] Today's Date: 04/04/2023 Time: 4403-4742 SLP Time Calculation (min) (ACUTE ONLY): 24 min  Assessment / Plan / Recommendation Clinical Impression  Pt awake and follows very simple commands for oral care. Unable to phonate more than a wet whisper, cough on command is attempted, but profoundly weak, does not mobilize secretions. SLP suctioned a large amount of thick mucous from pts throat and completed oral care to peel off dried secretions from soft palate and palate. Respirations less wet after care. SLP offered 3 ice chips, each followed by oral manipulation and a swallow from pt. SLP applied suction to removed loosed material after each. Pt is not strong enough for MBS yet and is not managing secretions ofr success with FEES. Needs oral hydration to at least prevent thickend mucous from forming plugs. Recommend a few ice chips per shift with RN to keep throat moist and mobilize secretions with swallowing opportunities. Will f/u for potential FEES  HPI HPI: 62 y.o. F with history neurocytoma s/p resection in 2007, seizures, who presented with change in mentation, admitted to ICU for seizures, sepsis. Found to have MSSA pna (completed treatment), acute metabolic encephalopathy. Intubated 12/7-12/19  Per chart she had cognitive impairment and dementia and requires assistance for ADL's including feeding. With past medical history of astrocytoma s/p chemo, radiation, and multiple resections (2007), seizures, and depression      SLP Plan  Continue with current plan of care      Recommendations for follow up therapy are one component of a multi-disciplinary discharge planning process, led by the attending physician.  Recommendations may be updated based on patient status, additional functional criteria and insurance authorization.    Recommendations  Diet recommendations:  NPO;Other(comment) (ice) Medication Administration: Via alternative means Supervision: Full supervision/cueing for compensatory strategies                  Oral care prior to ice chip/H20     Dysphagia, unspecified (R13.10)     Continue with current plan of care     Yehoshua Vitelli, Riley Nearing  04/04/2023, 9:34 AM

## 2023-04-05 LAB — COMPREHENSIVE METABOLIC PANEL
ALT: 44 U/L (ref 0–44)
AST: 39 U/L (ref 15–41)
Albumin: 1.8 g/dL — ABNORMAL LOW (ref 3.5–5.0)
Alkaline Phosphatase: 174 U/L — ABNORMAL HIGH (ref 38–126)
Anion gap: 7 (ref 5–15)
BUN: 31 mg/dL — ABNORMAL HIGH (ref 6–20)
CO2: 29 mmol/L (ref 22–32)
Calcium: 8.2 mg/dL — ABNORMAL LOW (ref 8.9–10.3)
Chloride: 103 mmol/L (ref 98–111)
Creatinine, Ser: 0.64 mg/dL (ref 0.44–1.00)
GFR, Estimated: >60 mL/min (ref >60)
Glucose, Bld: 157 mg/dL — ABNORMAL HIGH (ref 70–99)
Potassium: 4.7 mmol/L (ref 3.5–5.1)
Sodium: 139 mmol/L (ref 135–145)
Total Bilirubin: 1.2 mg/dL — ABNORMAL HIGH (ref <1.2)
Total Protein: 5.5 g/dL — ABNORMAL LOW (ref 6.5–8.1)

## 2023-04-05 LAB — CBC
HCT: 28.5 % — ABNORMAL LOW (ref 36.0–46.0)
Hemoglobin: 9.2 g/dL — ABNORMAL LOW (ref 12.0–15.0)
MCH: 29.1 pg (ref 26.0–34.0)
MCHC: 32.3 g/dL (ref 30.0–36.0)
MCV: 90.2 fL (ref 80.0–100.0)
Platelets: 456 10*3/uL — ABNORMAL HIGH (ref 150–400)
RBC: 3.16 MIL/uL — ABNORMAL LOW (ref 3.87–5.11)
RDW: 15.7 % — ABNORMAL HIGH (ref 11.5–15.5)
WBC: 9.9 10*3/uL (ref 4.0–10.5)
nRBC: 0.2 % (ref 0.0–0.2)

## 2023-04-05 MED ORDER — ACETAMINOPHEN 650 MG RE SUPP
975.0000 mg | Freq: Four times a day (QID) | RECTAL | Status: DC | PRN
  Administered 2023-04-05 – 2023-04-07 (×2): 975 mg via RECTAL
  Filled 2023-04-05 (×2): qty 2

## 2023-04-05 MED ORDER — SODIUM CHLORIDE 0.9% FLUSH
10.0000 mL | Freq: Two times a day (BID) | INTRAVENOUS | Status: DC
  Administered 2023-04-05 – 2023-04-10 (×9): 10 mL

## 2023-04-05 MED ORDER — SODIUM CHLORIDE 0.9 % IV SOLN
2.0000 g | INTRAVENOUS | Status: DC
  Administered 2023-04-05 – 2023-04-06 (×2): 2 g via INTRAVENOUS
  Filled 2023-04-05 (×2): qty 20

## 2023-04-05 MED ORDER — SODIUM CHLORIDE 0.9% FLUSH
10.0000 mL | INTRAVENOUS | Status: DC | PRN
Start: 2023-04-05 — End: 2023-04-07
  Administered 2023-04-05 (×2): 10 mL

## 2023-04-05 NOTE — Plan of Care (Signed)
UTA patient orientation level due to being nonverbal. Patient had a BM this shift and did not produce any urine output following foley catheter removal. Patient RIJ central line access removed and in & out catheter performed to obtain urine culture with Jacques Earthly, NT as a witness. Patient turned Q2 hours as tolerable. Patient tolerated IV abx, along with tube feedings. VSS, will continue to monitor. Patient remains in low airloss bed.   Problem: Education: Goal: Ability to describe self-care measures that may prevent or decrease complications (Diabetes Survival Skills Education) will improve Outcome: Progressing   Problem: Coping: Goal: Ability to adjust to condition or change in health will improve Outcome: Progressing   Problem: Fluid Volume: Goal: Ability to maintain a balanced intake and output will improve Outcome: Progressing   Problem: Health Behavior/Discharge Planning: Goal: Ability to identify and utilize available resources and services will improve Outcome: Progressing   Problem: Health Behavior/Discharge Planning: Goal: Ability to manage health-related needs will improve Outcome: Progressing   Problem: Metabolic: Goal: Ability to maintain appropriate glucose levels will improve Outcome: Progressing   Problem: Nutritional: Goal: Maintenance of adequate nutrition will improve Outcome: Progressing   Problem: Nutritional: Goal: Progress toward achieving an optimal weight will improve Outcome: Progressing   Problem: Tissue Perfusion: Goal: Adequacy of tissue perfusion will improve Outcome: Progressing   Problem: Activity: Goal: Ability to tolerate increased activity will improve Outcome: Progressing   Problem: Respiratory: Goal: Ability to maintain a clear airway and adequate ventilation will improve Outcome: Progressing   Problem: Education: Goal: Knowledge of General Education information will improve Description: Including pain rating scale, medication(s)/side  effects and non-pharmacologic comfort measures Outcome: Progressing   Problem: Clinical Measurements: Goal: Ability to maintain clinical measurements within normal limits will improve Outcome: Progressing   Problem: Health Behavior/Discharge Planning: Goal: Ability to manage health-related needs will improve Outcome: Progressing   Problem: Clinical Measurements: Goal: Diagnostic test results will improve Outcome: Progressing   Problem: Clinical Measurements: Goal: Cardiovascular complication will be avoided Outcome: Progressing   Problem: Activity: Goal: Risk for activity intolerance will decrease Outcome: Progressing   Problem: Nutrition: Goal: Adequate nutrition will be maintained Outcome: Progressing   Problem: Coping: Goal: Level of anxiety will decrease Outcome: Progressing   Problem: Elimination: Goal: Will not experience complications related to bowel motility Outcome: Progressing   Problem: Elimination: Goal: Will not experience complications related to urinary retention Outcome: Progressing   Problem: Pain Management: Goal: General experience of comfort will improve Outcome: Progressing   Problem: Safety: Goal: Ability to remain free from injury will improve Outcome: Progressing   Problem: Skin Integrity: Goal: Risk for impaired skin integrity will decrease Outcome: Progressing

## 2023-04-05 NOTE — Progress Notes (Signed)
  Progress Note   Patient: Audrey Turner ZOX:[id number] DOB: [birth year] DOA: 03/16/2023     20 DOS: the patient was seen and examined on 04/05/2023 at 9:50 AM      Brief hospital course: 62 y.o. F with history neurocytoma s/p resection in 2007, seizures, who presented with change in mentation, admitted to ICU for seizures, sepsis.    Assessment and Plan: Fever  Fever 102F on 12/26 overnight.  High risk for aspiration pneumonia.  CXR shows low volumes. - Check blood cultures - Check urine culture - Start empiric antibiotics - Remove foley - Remove central line  MSSA pneumonia Completed treatment   Acute respiratory failure with hypoxia Weaned to room air   Acute metabolic encephalopathy At baseline the patient is wheelchair-bound, has cognitive impairment and dementia, and requires assistance for ADLs including feeding.  She currently has delirium from her severe sepsis. No change in mentation, not alert enough to swallow.  FEES notable for immediately obvious copious secretions covering the vocal cords that she was unable to clear with cues.  She remains unable to produce volitional cough, has a wet vocal quality, and mostly aphonic on attempts to vocalize -Delirium precautions - Continue home amantadine -Continue tube feeds   Seizure disorder Controlled now - Continue Keppra   Depression -Hold Pristiq and Abilify   Stage III pressure injury, right buttock Unstageable pressure injury, sacrum Both present on admission - WOC           Subjective: No change. Patient nonverbal.  No nursing concerns.         Physical Exam: BP 127/81   Pulse (!) 106   Temp 99.2 F (37.3 C) (Oral)   Resp 18   Wt 52 kg   SpO2 99%   BMI 22.39 kg/m   Chronically ill-appearing adult female, lying in bed, appears weak and tired RRR, no murmurs, no peripheral edema Respiratory rate normal, lungs clear without rales or wheezes Abdomen soft, no grimace to  palpation Contractured on the right side, able to get my hand to squeeze on the left, can follow commands to wiggle toes on the left, not on the right, unable to make any verbalizations, briefly makes eye contact, then closes her eyes       Data Reviewed: FEES report CBC shows stable anemia, white blood cell count normal Creatinine normal, BUN elevated Total bilirubin minimally elevated   Family Communication: Brother at the bedside    Disposition: Status is: Inpatient Plan for goals of care meeting on Sunday or Monday        Author: Alberteen Sam, MD 04/05/2023 4:15 PM  For on call review [email or web address].

## 2023-04-05 NOTE — Progress Notes (Signed)
This chaplain is present for F/U spiritual care. The Pt. brother-[name] is at the bedside. [name] gently welcomes me into the space and reminds me of the importance of him holding her hand and using a quiet voice. [name] believes the Pt. is listening.  The Pt. is resting comfortably with intermittent opening of her eyes during the visit.   [name] updated the chaplain on the Pt. swallow test and need for suctioning. The chaplain understands the Pt. Children, siblings, and mother will visit on Saturday. [name] shares he is hopeful a provider will be available for a family meeting on Sunday, with an opportunity to answer the family's questions.    The chaplain listened reflectively as [name] shared his belief that LTAC would not be QOL for the Pt. The chaplain affirmed [name] and Donetta Potts storytelling from the previous visit and the Pt. gift of actively sharing joy with others.  The chaplain understands clergy from two different congregations are supporting the family along with the chaplain's spiritual care. A spiritual care revisit is planned for Monday. Intercessory prayer continues for the Pt. and family.  Chaplain Stephanie Acre [phone]

## 2023-04-05 NOTE — Progress Notes (Signed)
This RN to bedside to discontinue internal jugular. Speech Pathology at bedside about to begin swallow study. Will return after lunch.

## 2023-04-05 NOTE — Progress Notes (Signed)
Speech Language Pathology Treatment: Dysphagia  Patient Details Name: Audrey Turner MRN: [id number] DOB: [birth year] Today's Date: 04/05/2023 Time: 3235-5732 SLP Time Calculation (min) (ACUTE ONLY): 20 min  Assessment / Plan / Recommendation Clinical Impression  Pt with decrease management of secretions and coughing up mucous prior to and after oral care. Weak cough and attempts at vocalizations are more of a whisper. She consumed ice chip and applesauce with delayed cough and indications of decreased airway protection with what appeared to be applesauce suctioned. Given that she is alert and able to initiate swallows, a FEES is recommended to further evaluate pharyngeal swallow ability which will be done at 11:00 today. Continue ice chips after oral care until then.    HPI HPI: 62 y.o. F with history neurocytoma s/p resection in 2007, seizures, who presented with change in mentation, admitted to ICU for seizures, sepsis. Found to have MSSA pna (completed treatment), acute metabolic encephalopathy. Intubated 12/7-12/19  Per chart she had cognitive impairment and dementia and requires assistance for ADL's including feeding. With past medical history of astrocytoma s/p chemo, radiation, and multiple resections (2007), seizures, and depression      SLP Plan  Continue with current plan of care      Recommendations for follow up therapy are one component of a multi-disciplinary discharge planning process, led by the attending physician.  Recommendations may be updated based on patient status, additional functional criteria and insurance authorization.    Recommendations  Diet recommendations: Other(comment) (ice chips) Medication Administration: Via alternative means                  Oral care prior to ice chip/H20   Frequent or constant Supervision/Assistance Dysphagia, unspecified (R13.10)     Continue with current plan of care     Royce Macadamia  04/05/2023, 9:18 AM

## 2023-04-05 NOTE — Procedures (Signed)
Objective Swallowing Evaluation: Type of Study: FEES-Fiberoptic Endoscopic Evaluation of Swallow   Patient Details  Name: Audrey Turner MRN: [id number] Date of Birth: 1961/03/23  Today's Date: 04/05/2023 Time: SLP Start Time (ACUTE ONLY): 1109 -SLP Stop Time (ACUTE ONLY): 1126  SLP Time Calculation (min) (ACUTE ONLY): 17 min   Past Medical History:  Past Medical History:  Diagnosis Date   Acute sinusitis, unspecified    Astrocytoma (HCC) 01/2006 /09/2006   grade 3 atrocytoma s/p chemo/radiation/surgery at [facility], in remission since at least 2011   Cancer [facility])    Brain    Diarrhea    Drug induced neutropenia(288.03)    Primary exertional headache    Recurrent depression (HCC)    Vaginitis    Past Surgical History:  Past Surgical History:  Procedure Laterality Date   BRAIN SURGERY     [facility].   d&c miscarriage  1993   left frontal craniotomy w/ left subtotal resection of left frontal brain tumor  01/10/2006   [facility] brain tumor surgery  01/13/2006   NSVD x 3 - premie at 22 wks     resection residual tumor  10/08/2006   Dr. Zachery Conch: [facility]   skull surg  2009/2010/2012   3 times past brain surg to close up opening   HPI: 62 y.o. F with history neurocytoma s/p resection in 2007, seizures, who presented with change in mentation, admitted to ICU for seizures, sepsis. Found to have MSSA pna (completed treatment), acute metabolic encephalopathy. Intubated 12/7-12/19  Per chart she had cognitive impairment and dementia and requires assistance for ADL's including feeding. With past medical history of astrocytoma s/p chemo, radiation, and multiple resections (2007), seizures, and depression   No data recorded   Recommendations for follow up therapy are one component of a multi-disciplinary discharge planning process, led by the attending physician.  Recommendations may be updated based on patient status, additional functional criteria and insurance  authorization.  Assessment / Plan / Recommendation     04/05/2023   11:51 AM  Clinical Impressions  Clinical Impression Pt initially sleeping but able to adequately waken for FEES. Noted on arrival is inability to produce volitional cough, wet vocal quality and aphonic on attempt to vocalize. Once scope in place glottis was difficult to view due to copious secretions initially covering vocal cords that she was unable to clear with cues. Applesauce administered with oral transit delays and swallow initiated at the vallecuale, significant and diffuse pharyngeal residue in valleculae, pyriform sinuses. Applesauce was aspirated with delayed reflexive cough which she was able to minimally mobilize at the level of the glottis but material in pyriform sinuses would repeatedly fall into airway, re-aspirating multiple times. Ice chips given at the end of procedure that mixed and thinned residue and continued to aspirate. She could not clear pharyngeal residue with repeats swallows and continued to have delayed coughing throughout procedure. SLP able to suction some degree of oral/pharyngeal residue post procedure. Recommend pt continue to be NPO with ice chips for comfort and prevent secretions from becoming hard and dried/plugs. ST to continue to work with pt on secretion management, volitional hard coughs.  SLP Visit Diagnosis Dysphagia, oropharyngeal phase (R13.12)  Attention and concentration deficit following --  Frontal lobe and executive function deficit following --  Impact on safety and function Severe aspiration risk;Risk for inadequate nutrition/hydration         04/05/2023   11:51 AM  Treatment Recommendations  Treatment Recommendations Therapy as outlined in treatment plan below  04/05/2023   11:51 AM  Prognosis  Prognosis for improved oropharyngeal function Fair  Barriers to Reach Goals Severity of deficits  Barriers/Prognosis Comment --       04/05/2023   11:51 AM  Diet  Recommendations  SLP Diet Recommendations NPO;Other (Comment)  Liquid Administration via --  Medication Administration Via alternative means  Compensations --  Postural Changes --         04/05/2023   11:51 AM  Other Recommendations  Recommended Consults --  Oral Care Recommendations Oral care QID  Caregiver Recommendations --  Follow Up Recommendations Skilled nursing-short term rehab (<3 hours/day)  Assistance recommended at discharge --  Functional Status Assessment Patient has had a recent decline in their functional status and demonstrates the ability to make significant improvements in function in a reasonable and predictable amount of time.       04/05/2023   11:51 AM  Frequency and Duration   Speech Therapy Frequency (ACUTE ONLY) min 2x/week  Treatment Duration 2 weeks         04/05/2023   11:51 AM  Oral Phase  Oral Phase Impaired  Oral - Pudding Teaspoon --  Oral - Pudding Cup --  Oral - Honey Teaspoon --  Oral - Honey Cup --  Oral - Nectar Teaspoon --  Oral - Nectar Cup --  Oral - Nectar Straw --  Oral - Thin Teaspoon --  Oral - Thin Cup --  Oral - Thin Straw --  Oral - Puree Reduced posterior propulsion;Delayed oral transit  Oral - Mech Soft --  Oral - Regular --  Oral - Multi-Consistency --  Oral - Pill --  Oral Phase - Comment --       04/05/2023   11:51 AM  Pharyngeal Phase  Pharyngeal Phase Impaired  Pharyngeal- Pudding Teaspoon --  Pharyngeal --  Pharyngeal- Pudding Cup --  Pharyngeal --  Pharyngeal- Honey Teaspoon --  Pharyngeal --  Pharyngeal- Honey Cup --  Pharyngeal --  Pharyngeal- Nectar Teaspoon --  Pharyngeal --  Pharyngeal- Nectar Cup --  Pharyngeal --  Pharyngeal- Nectar Straw --  Pharyngeal --  Pharyngeal- Thin Teaspoon --  Pharyngeal --  Pharyngeal- Thin Cup --  Pharyngeal --  Pharyngeal- Thin Straw --  Pharyngeal --  Pharyngeal- Puree Reduced airway/laryngeal closure;Reduced laryngeal  elevation;Penetration/Aspiration during swallow;Penetration/Apiration after swallow;Significant aspiration (Amount);Pharyngeal residue - valleculae;Pharyngeal residue - pyriform;Delayed swallow initiation-vallecula;Reduced anterior laryngeal mobility;Reduced tongue base retraction;Compensatory strategies attempted (with notebox)  Pharyngeal Material enters airway, passes BELOW cords and not ejected out despite cough attempt by patient  Pharyngeal- Mechanical Soft --  Pharyngeal --  Pharyngeal- Regular --  Pharyngeal --  Pharyngeal- Multi-consistency --  Pharyngeal --  Pharyngeal- Pill --  Pharyngeal --  Pharyngeal Comment --        04/05/2023   11:51 AM  Cervical Esophageal Phase   Cervical Esophageal Phase WFL  Pudding Teaspoon --  Pudding Cup --  Honey Teaspoon --  Honey Cup --  Nectar Teaspoon --  Nectar Cup --  Nectar Straw --  Thin Teaspoon --  Thin Cup --  Thin Straw --  Puree --  Mechanical Soft --  Regular --  Multi-consistency --  Pill --  Cervical Esophageal Comment --     Royce Macadamia 04/05/2023, 12:42 PM

## 2023-04-05 NOTE — Care Management Important Message (Signed)
Important Message  Patient Details  Name: Audrey Turner MRN: [id number] Date of Birth: 02/05/1961   Important Message Given:  Yes - Medicare IM     Dorena Bodo 04/05/2023, 2:51 PM

## 2023-04-06 DIAGNOSIS — G9341 Metabolic encephalopathy: Secondary | ICD-10-CM

## 2023-04-06 DIAGNOSIS — R6521 Severe sepsis with septic shock: Secondary | ICD-10-CM

## 2023-04-06 DIAGNOSIS — Z789 Other specified health status: Secondary | ICD-10-CM

## 2023-04-06 DIAGNOSIS — Z711 Person with feared health complaint in whom no diagnosis is made: Secondary | ICD-10-CM

## 2023-04-06 LAB — CBC
HCT: 30.5 % — ABNORMAL LOW (ref 36.0–46.0)
Hemoglobin: 10 g/dL — ABNORMAL LOW (ref 12.0–15.0)
MCH: 29.3 pg (ref 26.0–34.0)
MCHC: 32.8 g/dL (ref 30.0–36.0)
MCV: 89.4 fL (ref 80.0–100.0)
Platelets: 419 10*3/uL — ABNORMAL HIGH (ref 150–400)
RBC: 3.41 MIL/uL — ABNORMAL LOW (ref 3.87–5.11)
RDW: 15.8 % — ABNORMAL HIGH (ref 11.5–15.5)
WBC: 13.6 10*3/uL — ABNORMAL HIGH (ref 4.0–10.5)
nRBC: 0.1 % (ref 0.0–0.2)

## 2023-04-06 LAB — COMPREHENSIVE METABOLIC PANEL
ALT: 39 U/L (ref 0–44)
AST: 38 U/L (ref 15–41)
Albumin: 1.7 g/dL — ABNORMAL LOW (ref 3.5–5.0)
Alkaline Phosphatase: 194 U/L — ABNORMAL HIGH (ref 38–126)
Anion gap: 11 (ref 5–15)
BUN: 33 mg/dL — ABNORMAL HIGH (ref 6–20)
CO2: 26 mmol/L (ref 22–32)
Calcium: 8.5 mg/dL — ABNORMAL LOW (ref 8.9–10.3)
Chloride: 101 mmol/L (ref 98–111)
Creatinine, Ser: 0.6 mg/dL (ref 0.44–1.00)
GFR, Estimated: >60 mL/min (ref >60)
Glucose, Bld: 138 mg/dL — ABNORMAL HIGH (ref 70–99)
Potassium: 4.8 mmol/L (ref 3.5–5.1)
Sodium: 138 mmol/L (ref 135–145)
Total Bilirubin: 0.9 mg/dL (ref <1.2)
Total Protein: 5.8 g/dL — ABNORMAL LOW (ref 6.5–8.1)

## 2023-04-06 MED ORDER — IPRATROPIUM-ALBUTEROL 0.5-2.5 (3) MG/3ML IN SOLN: 3.0000 mL | RESPIRATORY_TRACT | Status: DC | PRN

## 2023-04-06 NOTE — Plan of Care (Signed)
  Problem: Education: Goal: Ability to describe self-care measures that may prevent or decrease complications (Diabetes Survival Skills Education) will improve Outcome: Progressing   Problem: Coping: Goal: Ability to adjust to condition or change in health will improve Outcome: Progressing   Problem: Fluid Volume: Goal: Ability to maintain a balanced intake and output will improve Outcome: Progressing   Problem: Health Behavior/Discharge Planning: Goal: Ability to identify and utilize available resources and services will improve Outcome: Progressing Goal: Ability to manage health-related needs will improve Outcome: Progressing   Problem: Metabolic: Goal: Ability to maintain appropriate glucose levels will improve Outcome: Progressing   Problem: Nutritional: Goal: Maintenance of adequate nutrition will improve Outcome: Progressing Goal: Progress toward achieving an optimal weight will improve Outcome: Progressing   Problem: Skin Integrity: Goal: Risk for impaired skin integrity will decrease Outcome: Progressing   Problem: Tissue Perfusion: Goal: Adequacy of tissue perfusion will improve Outcome: Progressing   Problem: Activity: Goal: Ability to tolerate increased activity will improve Outcome: Progressing   Problem: Respiratory: Goal: Ability to maintain a clear airway and adequate ventilation will improve Outcome: Progressing   Problem: Role Relationship: Goal: Method of communication will improve Outcome: Progressing   Problem: Education: Goal: Knowledge of General Education information will improve Description: Including pain rating scale, medication(s)/side effects and non-pharmacologic comfort measures Outcome: Progressing   Problem: Health Behavior/Discharge Planning: Goal: Ability to manage health-related needs will improve Outcome: Progressing   Problem: Clinical Measurements: Goal: Ability to maintain clinical measurements within normal limits will  improve Outcome: Progressing Goal: Will remain free from infection Outcome: Progressing Goal: Diagnostic test results will improve Outcome: Progressing Goal: Respiratory complications will improve Outcome: Progressing Goal: Cardiovascular complication will be avoided Outcome: Progressing   Problem: Activity: Goal: Risk for activity intolerance will decrease Outcome: Progressing   Problem: Nutrition: Goal: Adequate nutrition will be maintained Outcome: Progressing   Problem: Coping: Goal: Level of anxiety will decrease Outcome: Progressing   Problem: Elimination: Goal: Will not experience complications related to bowel motility Outcome: Progressing Goal: Will not experience complications related to urinary retention Outcome: Progressing   Problem: Pain Management: Goal: General experience of comfort will improve Outcome: Progressing   Problem: Safety: Goal: Ability to remain free from injury will improve Outcome: Progressing   Problem: Skin Integrity: Goal: Risk for impaired skin integrity will decrease Outcome: Progressing

## 2023-04-06 NOTE — Progress Notes (Signed)
Daily Progress Note   Patient Name: Audrey Turner       Date: 04/06/2023 DOB: June 22, 1960  Age: 62 y.o. MRN#: [id number] Attending Physician: Alberteen Sam, * Primary Care Physician: Joaquim Nam, MD Admit Date: 03/16/2023  Reason for Consultation/Follow-up: Establishing goals of care, "poor neurological recovery following status epilepticus."  Subjective: I have reviewed medical records including EPIC notes, MAR, available advanced directives as necessary, and labs.  11:22 AM Called patient's brother/[name] - emotional support provided. Patient's other brother and daughter are both traveling from out of town and will be arriving later this evening. Family would like an in-person meeting tomorrow 12/29 at 1pm for continued GOC.  All questions and concerns addressed. Encouraged to call with questions and/or concerns. PMT number provided.  Received report from primary RN - no acute concerns. RN reports patient continues with deep secretions, which are unable to be suctioned. Her saturations dropped to high 80s this morning on RA - she is now on 3L O2 [place].   Went to visit patient at bedside. She is lying in bed with her eyes open; remains non-verbal. Non-verbal signs of discomfort noted. Coretrak in use. She is ill and frail appearing.  Length of Stay: 21  Current Medications: Scheduled Meds:   sodium chloride   Intravenous Once   sodium chloride   Intravenous Once   amantadine  100 mg Per Tube BID   ascorbic acid  500 mg Per Tube Daily   Chlorhexidine Gluconate Cloth  6 each Topical Daily   famotidine  20 mg Per Tube Daily   fiber supplement (BANATROL TF)  60 mL Per Tube Q8H   free water  100 mL Per Tube Q4H   Gerhardt's butt cream   Topical TID   heparin injection  (subcutaneous)  5,000 Units Subcutaneous Q8H   leptospermum manuka honey  1 Application Topical Daily   levETIRAcetam  1,000 mg Per Tube BID   mouth rinse  15 mL Mouth Rinse 4 times per day   sodium chloride flush  10-40 mL Intracatheter Q12H   thiamine  100 mg Per Tube Daily   zinc sulfate (50mg  elemental zinc)  220 mg Per Tube Daily    Continuous Infusions:  cefTRIAXone (ROCEPHIN)  IV Stopped (04/05/23 1702)   feeding supplement (OSMOLITE 1.2 CAL) 50 mL/hr at 04/06/23  0423    PRN Meds: acetaminophen, docusate sodium, fentaNYL (SUBLIMAZE) injection, hydrALAZINE, ipratropium-albuterol, labetalol, lip balm, mouth rinse, polyethylene glycol, sodium chloride flush  Physical Exam Vitals and nursing note reviewed.  Constitutional:      General: She is not in acute distress.    Appearance: She is ill-appearing.  Pulmonary:     Effort: No respiratory distress.     Comments: Secretions  Skin:    General: Skin is warm and dry.  Neurological:     Mental Status: She is alert. Mental status is at baseline.     Motor: Weakness present.             Vital Signs: BP 136/69 (BP Location: Left Arm)   Pulse 88   Temp 100.2 F (37.9 C) (Oral)   Resp 20   Wt 54 kg   SpO2 92%   BMI 23.25 kg/m  SpO2: SpO2: 92 % O2 Device: O2 Device: Room Air, Nasal Cannula O2 Flow Rate: O2 Flow Rate (L/min): 3 L/min  Intake/output summary:  Intake/Output Summary (Last 24 hours) at 04/06/2023 1130 Last data filed at 04/06/2023 0600 Gross per 24 hour  Intake 2144.17 ml  Output 350 ml  Net 1794.17 ml   LBM: Last BM Date : 04/03/23 Baseline Weight: Weight:  (scale malfunctioning) Most recent weight: Weight: 54 kg       Palliative Assessment/Data: PPS 30% with tube feeds      Patient Active Problem List   Diagnosis Date Noted   Altered mental status 03/27/2023   Pressure injury of skin 03/25/2023   Acute respiratory failure with hypoxia (HCC) 03/17/2023   URI (upper respiratory infection)  03/17/2023   Sepsis (HCC) 03/16/2023   Epistaxis 10/15/2021   Cold extremities 06/07/2021   Insomnia 01/22/2021   Healthcare maintenance 09/08/2019   Contracture of hand 09/08/2019   Urinary incontinence 06/12/2019   Medicare annual wellness visit, subsequent 06/11/2018   Lower extremity edema 06/11/2018   HLD (hyperlipidemia) 05/13/2017   Localization-related symptomatic epilepsy and epileptic syndromes with complex partial seizures, not intractable, without status epilepticus (HCC) 07/17/2016   Hx of astrocytoma 07/17/2016   Hypoxia, sleep related 07/03/2016   Convulsions/seizures (HCC) 12/14/2015   Advance care planning 12/13/2015   Muscular deconditioning 11/10/2015   Hyperglycemia 03/12/2013   Obstructive sleep apnea 12/22/2009   Depression 08/14/2007   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 female  with past medical history of astrocytoma s/p chemo, radiation, and multiple resections (2007), seizures, and depression admitted on 03/16/2023 with altered mental status.  Workup revealed acute metabolic encephalopathy felt to be secondary to sepsis from pneumonia and status epilepticus. She was intubated for airway protection. She has now been off sedation for several days with limited improvement in mental status. She was extubated 12/19, however mental status remains poor. Very lethargic and unable to complete swallow evaluation- cortrak in place.   Assessment: Principal Problem:   Sepsis (HCC) Active Problems:   Acute respiratory failure with hypoxia (HCC)   Pressure injury of skin   Altered mental status   Concern about end of life  Recommendations/Plan: Continue current plan for now  Continue DNR/DNI limited as previously documented - durable DNR form completed and placed in shadow chart. Copy was made and will be scanned into Vynca/ACP tab Several family members are traveling into town and  will arrive this evening. Family meeting scheduled for tomorrow 12/29 at 1p for continued  GOC PMT will continue to follow and support holistically  Goals of Care and Additional Recommendations: Limitations on Scope of Treatment: Full Scope Treatment and No Tracheostomy  Code Status:    Code Status Orders  (From admission, onward)           Start     Ordered   03/28/23 1429  Do not attempt resuscitation (DNR)- Limited -Do Not Intubate (DNI)  (Code Status)  Continuous       Question Answer Comment  If pulseless and not breathing No CPR or chest compressions.   In Pre-Arrest Conditions (Patient Is Breathing and Has A Pulse) Do not intubate. Provide all appropriate non-invasive medical interventions. Avoid ICU transfer unless indicated or required.   Consent: Discussion documented in EHR or advanced directives reviewed      03/28/23 1429           Code Status History     Date Active Date Inactive Code Status Order ID Comments User Context   03/23/2023 1327 03/28/2023 1429 Do not attempt resuscitation (DNR) PRE-ARREST INTERVENTIONS DESIRED [id number]  Sherryll Burger, NP Inpatient   03/16/2023 2318 03/23/2023 1327 Full Code [id number]  Simonne Martinet, NP ED       Prognosis:  Unable to determine  Discharge Planning: To Be Determined  Care plan was discussed with primary RN, Dr. Maryfrances Bunnell, patient's brother  Thank you for allowing the Palliative Medicine Team to assist in the care of this patient.   Total Time 35 minutes Prolonged Time Billed  no       Haskel Khan, NP  Please contact Palliative Medicine Team phone at [phone] for questions and concerns.   *Portions of this note are a verbal dictation therefore any spelling and/or grammatical errors are due to the "Dragon [employer] One" system interpretation.

## 2023-04-06 NOTE — Progress Notes (Signed)
  Progress Note   Patient: Audrey Turner WGN:[id number] DOB: [birth year] DOA: 03/16/2023     21 DOS: the patient was seen and examined on 04/06/2023 at 8:59AM      Brief hospital course: 63 y.o. F with history neurocytoma s/p resection in 2007, seizures, who presented with change in mentation, admitted to ICU for seizures, sepsis.    Assessment and Plan: Aspiration pneumonia. Patient developed fever 102F on 12/26.  FEES that day notable for immediately obvious copious secretions covering the vocal cords.  In the last 24 hours now she has become hypoxic and rhonchorous. - Continue empiric antibiotics    Acute metabolic encephalopathy No improvement -Delirium precautions - Continue home amantadine -Continue tube feeds   Seizure disorder Controlled now - Continue Keppra   Depression -Hold Pristiq and Abilify   Stage III pressure injury, right buttock Unstageable pressure injury, sacrum Both present on admission - WOC           Subjective: Worsening oxygenation, sounds rhonchorous, coughing occasionally, very weak.         Physical Exam: BP 128/80 (BP Location: Left Arm)   Pulse 62   Temp 99.6 F (37.6 C) (Oral)   Resp 16   Ht 5' (1.524 m)   Wt 54.1 kg   SpO2 94%   BMI 23.29 kg/m   Chronically ill-appearing adult female, lying in bed, appears weak and tired RRR, no murmurs, no peripheral edema Tachypneic, lung sounds rhonchorous Abdomen soft, no grimace to palpation Contractured on the right side, able to get my hand to squeeze on the left, can follow commands to wiggle toes on the left, not on the right, unable to make any verbalizations, briefly makes eye contact, then closes her eyes          Author: Alberteen Sam, MD 04/06/2023 5:28 PM  For on call review [email or web address].

## 2023-04-07 DIAGNOSIS — R0989 Other specified symptoms and signs involving the circulatory and respiratory systems: Secondary | ICD-10-CM

## 2023-04-07 DIAGNOSIS — R638 Other symptoms and signs concerning food and fluid intake: Secondary | ICD-10-CM

## 2023-04-07 LAB — URINE CULTURE

## 2023-04-07 MED ORDER — ONDANSETRON 4 MG PO TBDP: 4.0000 mg | ORAL_TABLET | Freq: Four times a day (QID) | ORAL | Status: DC | PRN

## 2023-04-07 MED ORDER — SODIUM CHLORIDE 0.9 % IV SOLN: 2.0000 g | Freq: Two times a day (BID) | INTRAVENOUS | Status: DC

## 2023-04-07 MED ORDER — GLYCOPYRROLATE 0.2 MG/ML IJ SOLN
0.2000 mg | INTRAMUSCULAR | Status: DC | PRN
  Administered 2023-04-07: 0.2 mg via INTRAVENOUS
  Filled 2023-04-07: qty 1

## 2023-04-07 MED ORDER — LORAZEPAM 2 MG/ML PO CONC
1.0000 mg | ORAL | Status: DC | PRN
  Filled 2023-04-07: qty 0.5

## 2023-04-07 MED ORDER — HALOPERIDOL 1 MG PO TABS
2.0000 mg | ORAL_TABLET | Freq: Four times a day (QID) | ORAL | Status: DC | PRN
Start: 2023-04-07 — End: 2023-04-10

## 2023-04-07 MED ORDER — HALOPERIDOL LACTATE 2 MG/ML PO CONC: 2.0000 mg | Freq: Four times a day (QID) | ORAL | Status: DC | PRN

## 2023-04-07 MED ORDER — LORAZEPAM 1 MG PO TABS: 1.0000 mg | ORAL_TABLET | ORAL | Status: DC | PRN

## 2023-04-07 MED ORDER — ONDANSETRON HCL 4 MG/2ML IJ SOLN: 4.0000 mg | Freq: Four times a day (QID) | INTRAMUSCULAR | Status: DC | PRN

## 2023-04-07 MED ORDER — MORPHINE SULFATE (PF) 2 MG/ML IV SOLN: 2.0000 mg | INTRAVENOUS | Status: DC | PRN

## 2023-04-07 MED ORDER — POLYVINYL ALCOHOL 1.4 % OP SOLN: 1.0000 [drp] | Freq: Four times a day (QID) | OPHTHALMIC | Status: DC | PRN

## 2023-04-07 MED ORDER — ACETAMINOPHEN 325 MG PO TABS: 650.0000 mg | ORAL_TABLET | Freq: Four times a day (QID) | ORAL | Status: DC | PRN

## 2023-04-07 MED ORDER — GLYCOPYRROLATE 0.2 MG/ML IJ SOLN
0.2000 mg | INTRAMUSCULAR | Status: DC | PRN
  Filled 2023-04-07: qty 1

## 2023-04-07 MED ORDER — CIPROFLOXACIN IN D5W 200 MG/100ML IV SOLN
200.0000 mg | Freq: Two times a day (BID) | INTRAVENOUS | Status: DC
  Administered 2023-04-07 – 2023-04-10 (×6): 200 mg via INTRAVENOUS
  Filled 2023-04-07 (×6): qty 100

## 2023-04-07 MED ORDER — ACETAMINOPHEN 650 MG RE SUPP: 650.0000 mg | Freq: Four times a day (QID) | RECTAL | Status: DC | PRN

## 2023-04-07 MED ORDER — BIOTENE DRY MOUTH MT LIQD
15.0000 mL | Freq: Two times a day (BID) | OROMUCOSAL | Status: DC
  Administered 2023-04-07 – 2023-04-10 (×7): 15 mL via TOPICAL

## 2023-04-07 MED ORDER — GLYCOPYRROLATE 1 MG PO TABS: 1.0000 mg | ORAL_TABLET | ORAL | Status: DC | PRN

## 2023-04-07 MED ORDER — OXYCODONE HCL 20 MG/ML PO CONC
5.0000 mg | ORAL | Status: DC | PRN
  Filled 2023-04-07: qty 0.5

## 2023-04-07 MED ORDER — HALOPERIDOL LACTATE 5 MG/ML IJ SOLN: 2.0000 mg | Freq: Four times a day (QID) | INTRAMUSCULAR | Status: DC | PRN

## 2023-04-07 MED ORDER — LORAZEPAM 2 MG/ML IJ SOLN
1.0000 mg | INTRAMUSCULAR | Status: DC | PRN
  Administered 2023-04-10: 1 mg via INTRAVENOUS
  Filled 2023-04-07: qty 1

## 2023-04-07 MED ORDER — DIPHENHYDRAMINE HCL 50 MG/ML IJ SOLN: 12.5000 mg | INTRAMUSCULAR | Status: DC | PRN

## 2023-04-07 NOTE — Progress Notes (Signed)
Pt's BP 170/88 map 121, HR 110, RR 20, T 101. PRN Labetalol given for BP and Tylenol for Temp. Pt suctioned and oral care provided. Recheck Vitals: BP 106/68 map 77, HR 84, T 99.8. MD notified. Pt is resting in no distress. Will continue to monitor.

## 2023-04-07 NOTE — Progress Notes (Signed)
°  Progress Note   Patient: Audrey Turner ZOX:[id number] DOB: Jul 13, 1960 DOA: 03/16/2023     22 DOS: the patient was seen and examined on 04/07/2023 at 10:37AM      Brief hospital course: 62 y.o. F with history neurocytoma s/p resection in 2007, seizures, who presented with change in mentation, admitted to ICU for seizures, sepsis.    Assessment and Plan: MSSA pneumonia, present on admission Completed treatment   Acute respiratory failure with hypoxia, present on admission Weaned to room air   Acute metabolic encephalopathy At baseline the patient is wheelchair-bound, has cognitive impairment and dementia, and requires assistance for ADLs including feeding.  Post-extubation, she had very limited physical and cognitive recovery.  Currently has severe weakness, too weak to vocalize, lift her arms or head, swallow or cough.    Speech therapy have been following patient and she has remained too weak to swallow.  FEES 12/27 notable for immediately obvious copious secretions covering the vocal cords that she was unable to clear with cues.  She remains unable to produce volitional cough, has a wet vocal quality, and mostly aphonic on attempts to vocalize  In GOC discussion on 12/29 with brothers (one of which is POA) and 2/3 daughters, the decision was made to pursue comfort measures and Hospice - Consult Hospice - Delirium precautions - Continue Keppra, amantadine for comfort - Continue tube feeds and antibiotics for now, plan for discontinuation of all of the above at time of discharge to Hospice   New HCAP Fever 102F on 12/26 overnight. Then developed wet cough and hypoxia. FEES the following day showed copious secretions on the vocal cords.    - Cipro  Seizure disorder - Continue Keppra   Depression - Stop Pristiq and Abilify   Stage III pressure injury, right buttock Unstageable pressure injury, sacrum Both present on admission - WOC           Subjective: No  change. Patient nonverbal.  No nursing concerns.         Physical Exam: BP 106/68 (BP Location: Left Arm)   Pulse 84   Temp 99.8 F (37.7 C) (Axillary)   Resp 18   Ht 5' (1.524 m)   Wt 53 kg   SpO2 98%   BMI 22.82 kg/m   Chronically ill-appearing adult female, lying in bed, appears weak and tired RRR, no murmurs, no peripheral edema Respiratory rate normal, lungs clear without rales or wheezes Abdomen soft, no grimace to palpation Contractured on the right side, able to get my hand to squeeze on the left, can follow commands to wiggle toes on the left, not on the right, unable to make any verbalizations, briefly makes eye contact, then closes her eyes               Author: Alberteen Sam, MD 04/07/2023 5:21 PM  For on call review [email or web address].

## 2023-04-07 NOTE — Progress Notes (Signed)
Daily Progress Note   Patient Name: Audrey Turner       Date: 04/07/2023 DOB: Jan 24, 1961  Age: 62 y.o. MRN#: [id number] Attending Physician: Alberteen Sam, * Primary Care Physician: Joaquim Nam, MD Admit Date: 03/16/2023  Reason for Consultation/Follow-up: Establishing goals of care, "poor neurological recovery following status epilepticus."   Subjective: I have reviewed medical records including EPIC notes, MAR, available advanced directives as necessary, and labs. Unable to receive report from primary RN.  Discussed case with Dr. Maryfrances Bunnell.  Went to visit patient at bedside. Patient lying in bed with eyes closed - I did not attempt to arouse her to maintain comfort. No signs or non-verbal gestures of pain or discomfort noted. No respiratory distress, increased work of breathing, or secretions noted. Coretrak in use.  1:00 - 2:30 PM Met with Daughter/Sarah and her husband/Austin, daughter/Caroline, brother/[name], brother/Ken, and another family member/Austin along with Dr. Maryfrances Bunnell to discuss diagnosis, prognosis, GOC, EOL wishes, disposition, and options. Per family, youngest daughter is ok with whatever decisions the group makes today since she cannot be present.  Emotional support provided to family. Dr. Maryfrances Bunnell reviewed patient's interval history since admission.  We discussed patient's current illness and what it means in the larger context of patient's on-going co-morbidities. Family have a clear understanding of patient's current acute medical situation. Natural disease trajectory and expectations at EOL were discussed. I attempted to elicit values and goals of care important to the patient. The difference between aggressive medical intervention (PEG, need for long  term care, high risk for recurrent aspiration pneumonia and rehospitalizations) and comfort care was considered in light of the patient's goals of care.   Therapeutic listening provided as family reflect on previous discussions with patient regarding her care preferences. Family are clear patient would not want her life prolonged by artificial nutrition long term - they are not interested in pursuing PEG.   Provided education and counseling at length on the philosophy and benefits of hospice care. Discussed that it offers a holistic approach to care in the setting of end-stage illness, and is about supporting the patient where they are allowing nature to take it's course. Discussed the hospice team includes RNs, physicians, social workers, and chaplains. They can provide personal care, support for the family, and help keep patient out of the hospital as  well as assist with DME needs for home hospice. Education provided on the difference between home vs residential hospice. Family are not sure at this time if they would prefer residential vs home hospice. They are open to speak with hospice liaison to gain further details and discuss options. Family prefer to work with [facility] as [facility] is central to most family, if they decide to utilize their facility.   We talked about transition to comfort measures in house and what that would entail inclusive of medications to control pain, dyspnea, agitation, nausea, and itching. We discussed stopping all unnecessary measures such as blood draws, needle sticks, oxygen, antibiotics, CBGs/insulin, cardiac monitoring, IVF, and frequent vital signs. Education provided that other non-pharmacological interventions would be utilized for holistic support and comfort such as spiritual support if requested, repositioning, music therapy, offering comfort feeds, and/or therapeutic listening. All care would focus on how the patient is looking and feeling. Family opt for  patient's transition to full comfort care today; however, would like to continue artificial nutrition/hydration and antibiotics while still admitted. Family are hopeful patient can have more "good moments" to visit with family.  Concept of comfort feeds discussed. Allowed space and time for family to discuss information throughout visit.  Prognosis reviewed to likely be 2 weeks or less when artificial nutrition/hydration stopped.  Visit also consisted of discussions dealing with the complex and emotionally intense issues of symptom management and palliative care in the setting of serious and life-threatening illness.   Discussed with family the importance of continued conversation with each other and the medical providers regarding overall plan of care and treatment options, ensuring decisions are within the context of the patients values and GOCs.    Questions and concerns were addressed. The patient/family was encouraged to call with questions and/or concerns. PMT card was provided.   Length of Stay: 22  Current Medications: Scheduled Meds:   sodium chloride   Intravenous Once   sodium chloride   Intravenous Once   amantadine  100 mg Per Tube BID   ascorbic acid  500 mg Per Tube Daily   Chlorhexidine Gluconate Cloth  6 each Topical Daily   famotidine  20 mg Per Tube Daily   fiber supplement (BANATROL TF)  60 mL Per Tube Q8H   free water  100 mL Per Tube Q4H   Gerhardt's butt cream   Topical TID   heparin injection (subcutaneous)  5,000 Units Subcutaneous Q8H   leptospermum manuka honey  1 Application Topical Daily   levETIRAcetam  1,000 mg Per Tube BID   mouth rinse  15 mL Mouth Rinse 4 times per day   sodium chloride flush  10-40 mL Intracatheter Q12H   thiamine  100 mg Per Tube Daily    Continuous Infusions:  cefTRIAXone (ROCEPHIN)  IV 2 g (04/06/23 1508)   feeding supplement (OSMOLITE 1.2 CAL) 1,000 mL (04/07/23 1221)    PRN Meds: acetaminophen, docusate sodium, fentaNYL  (SUBLIMAZE) injection, hydrALAZINE, ipratropium-albuterol, labetalol, lip balm, mouth rinse, polyethylene glycol, sodium chloride flush  Physical Exam Vitals and nursing note reviewed.  Constitutional:      General: She is not in acute distress.    Appearance: She is ill-appearing.  Pulmonary:     Effort: No respiratory distress.  Skin:    General: Skin is warm and dry.  Neurological:     Mental Status: She is alert. Mental status is at baseline.     Motor: Weakness present.  Vital Signs: BP 106/68 (BP Location: Left Arm)   Pulse 84   Temp 99.8 F (37.7 C) (Axillary)   Resp 18   Ht 5' (1.524 m)   Wt 53 kg   SpO2 98%   BMI 22.82 kg/m  SpO2: SpO2: 98 % O2 Device: O2 Device: Nasal Cannula O2 Flow Rate: O2 Flow Rate (L/min): 2 L/min  Intake/output summary:  Intake/Output Summary (Last 24 hours) at 04/07/2023 1246 Last data filed at 04/07/2023 1610 Gross per 24 hour  Intake 200 ml  Output 910 ml  Net -710 ml   LBM: Last BM Date : 04/05/23 Baseline Weight: Weight:  (scale malfunctioning) Most recent weight: Weight: 53 kg       Palliative Assessment/Data: PPS 30% with tube feeds      Patient Active Problem List   Diagnosis Date Noted   Altered mental status 03/27/2023   Pressure injury of skin 03/25/2023   Acute respiratory failure with hypoxia (HCC) 03/17/2023   URI (upper respiratory infection) 03/17/2023   Sepsis (HCC) 03/16/2023   Epistaxis 10/15/2021   Cold extremities 06/07/2021   Insomnia 01/22/2021   Healthcare maintenance 09/08/2019   Contracture of hand 09/08/2019   Urinary incontinence 06/12/2019   Medicare annual wellness visit, subsequent 06/11/2018   Lower extremity edema 06/11/2018   HLD (hyperlipidemia) 05/13/2017   Localization-related symptomatic epilepsy and epileptic syndromes with complex partial seizures, not intractable, without status epilepticus (HCC) 07/17/2016   Hx of astrocytoma 07/17/2016   Hypoxia, sleep related  07/03/2016   Convulsions/seizures (HCC) 12/14/2015   Advance care planning 12/13/2015   Muscular deconditioning 11/10/2015   Hyperglycemia 03/12/2013   Obstructive sleep apnea 12/22/2009   Depression 08/14/2007   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 female  with past medical history of astrocytoma s/p chemo, radiation, and multiple resections (2007), seizures, and depression admitted on 03/16/2023 with altered mental status.  Workup revealed acute metabolic encephalopathy felt to be secondary to sepsis from pneumonia and status epilepticus. She was intubated for airway protection. She has now been off sedation for several days with limited improvement in mental status. She was extubated 12/19, however mental status remains poor. Very lethargic and unable to complete swallow evaluation- cortrak in place.   Assessment: Principal Problem:   Sepsis (HCC) Active Problems:   Acute respiratory failure with hypoxia (HCC)   Pressure injury of skin   Altered mental status   Terminal care  Recommendations/Plan: Initiated full comfort measures Continue DNR/DNI as previously documented Family would like to discuss home vs residential hospice with hospice liaison, requesting [facility] - TOC consult placed; TOC and hospice liaison notified Continue coretrak with artificial nutrition/hydration until discharge Added orders for EOL symptom management and to reflect full comfort measures, as well as discontinued orders that were not focused on comfort Unrestricted visitation orders were placed per current [place] EOL visitation policy  Nursing to provide frequent assessments and administer PRN medications as clinically necessary to ensure EOL comfort PMT will continue to follow and support holistically  Symptom Management Morphine PRN severe pain/dyspnea/increased work of breathing/RR>25 Oxycodone PRN moderate  pain Tylenol PRN pain/fever Biotin twice daily Benadryl PRN itching Robinul PRN secretions Haldol PRN agitation/delirium Ativan PRN anxiety/seizure/sleep/distress Zofran PRN nausea/vomiting Liquifilm Tears PRN dry eye Continue amantadine, cipro, keppra until discharge   Goals of Care and Additional Recommendations: Limitations on Scope of Treatment: Full Comfort Care  Code Status:    Code  Status Orders  (From admission, onward)           Start     Ordered   03/28/23 1429  Do not attempt resuscitation (DNR)- Limited -Do Not Intubate (DNI)  (Code Status)  Continuous       Question Answer Comment  If pulseless and not breathing No CPR or chest compressions.   In Pre-Arrest Conditions (Patient Is Breathing and Has A Pulse) Do not intubate. Provide all appropriate non-invasive medical interventions. Avoid ICU transfer unless indicated or required.   Consent: Discussion documented in EHR or advanced directives reviewed      03/28/23 1429           Code Status History     Date Active Date Inactive Code Status Order ID Comments User Context   03/23/2023 1327 03/28/2023 1429 Do not attempt resuscitation (DNR) PRE-ARREST INTERVENTIONS DESIRED [id number]  Sherryll Burger, NP Inpatient   03/16/2023 2318 03/23/2023 1327 Full Code [id number]  Simonne Martinet, NP ED       Prognosis:  < 2 weeks  Discharge Planning: Hospice facility vs home with hospice  Care plan was discussed with Dr. Maryfrances Bunnell, patient's family, TOC, hospice liaison, primary RN  Thank you for allowing the Palliative Medicine Team to assist in the care of this patient.   Total Time 105 minutes Prolonged Time Billed  yes       Haskel Khan, NP  Please contact Palliative Medicine Team phone at [phone] for questions and concerns.   *Portions of this note are a verbal dictation therefore any spelling and/or grammatical errors are due to the "[employer]agon [employer] One" system interpretation.

## 2023-04-07 NOTE — Progress Notes (Signed)
SLP Cancellation Note  Patient Details Name: Jonika Engdahl MRN: [id number] DOB: 1960/10/11   Cancelled treatment:       Reason Eval/Treat Not Completed: Other (comment) SLP secure message chatted with Palliative MD who stated that patient is on comfort measures for PO's, etc. SLP to s/o at this time.  Angela Nevin, MA, CCC-SLP Speech Therapy

## 2023-04-07 NOTE — Plan of Care (Signed)
°  Problem: Education: Goal: Ability to describe self-care measures that may prevent or decrease complications (Diabetes Survival Skills Education) will improve Outcome: Progressing   Problem: Coping: Goal: Ability to adjust to condition or change in health will improve Outcome: Progressing   Problem: Fluid Volume: Goal: Ability to maintain a balanced intake and output will improve Outcome: Progressing   Problem: Health Behavior/Discharge Planning: Goal: Ability to identify and utilize available resources and services will improve Outcome: Progressing Goal: Ability to manage health-related needs will improve Outcome: Progressing   Problem: Metabolic: Goal: Ability to maintain appropriate glucose levels will improve Outcome: Progressing   Problem: Nutritional: Goal: Maintenance of adequate nutrition will improve Outcome: Progressing Goal: Progress toward achieving an optimal weight will improve Outcome: Progressing   Problem: Skin Integrity: Goal: Risk for impaired skin integrity will decrease Outcome: Progressing   Problem: Tissue Perfusion: Goal: Adequacy of tissue perfusion will improve Outcome: Progressing   Problem: Activity: Goal: Ability to tolerate increased activity will improve Outcome: Progressing   Problem: Respiratory: Goal: Ability to maintain a clear airway and adequate ventilation will improve Outcome: Progressing   Problem: Role Relationship: Goal: Method of communication will improve Outcome: Progressing   Problem: Education: Goal: Knowledge of General Education information will improve Description: Including pain rating scale, medication(s)/side effects and non-pharmacologic comfort measures Outcome: Progressing   Problem: Health Behavior/Discharge Planning: Goal: Ability to manage health-related needs will improve Outcome: Progressing   Problem: Clinical Measurements: Goal: Ability to maintain clinical measurements within normal limits will  improve Outcome: Progressing Goal: Will remain free from infection Outcome: Progressing Goal: Diagnostic test results will improve Outcome: Progressing Goal: Respiratory complications will improve Outcome: Progressing Goal: Cardiovascular complication will be avoided Outcome: Progressing   Problem: Activity: Goal: Risk for activity intolerance will decrease Outcome: Progressing   Problem: Nutrition: Goal: Adequate nutrition will be maintained Outcome: Progressing   Problem: Coping: Goal: Level of anxiety will decrease Outcome: Progressing   Problem: Elimination: Goal: Will not experience complications related to bowel motility Outcome: Progressing Goal: Will not experience complications related to urinary retention Outcome: Progressing   Problem: Pain Management: Goal: General experience of comfort will improve Outcome: Progressing   Problem: Safety: Goal: Ability to remain free from injury will improve Outcome: Progressing   Problem: Skin Integrity: Goal: Risk for impaired skin integrity will decrease Outcome: Progressing   Problem: Education: Goal: Knowledge of the prescribed therapeutic regimen will improve Outcome: Progressing   Problem: Coping: Goal: Ability to identify and develop effective coping behavior will improve Outcome: Progressing   Problem: Clinical Measurements: Goal: Quality of life will improve Outcome: Progressing   Problem: Respiratory: Goal: Verbalizations of increased ease of respirations will increase Outcome: Progressing   Problem: Role Relationship: Goal: Family's ability to cope with current situation will improve Outcome: Progressing Goal: Ability to verbalize concerns, feelings, and thoughts to partner or family member will improve Outcome: Progressing   Problem: Pain Management: Goal: Satisfaction with pain management regimen will improve Outcome: Progressing

## 2023-04-07 NOTE — Progress Notes (Signed)
[facility] liaison note  Referral received in patient/family interest in hospice services.   Family requests that someone from [facility] connect with them tomorrow.   Plan is to connect with family tomorrow.   Please call with any questions or concerns. Thank you  Dionicio Stall, LCSW [facility] hospital liaison [phone]

## 2023-04-08 MED ORDER — ORAL CARE MOUTH RINSE
15.0000 mL | OROMUCOSAL | Status: DC
  Administered 2023-04-08 – 2023-04-10 (×8): 15 mL via OROMUCOSAL

## 2023-04-08 MED ORDER — ORAL CARE MOUTH RINSE: 15.0000 mL | OROMUCOSAL | Status: DC | PRN

## 2023-04-08 MED ORDER — GLYCOPYRROLATE 0.2 MG/ML IJ SOLN
0.2000 mg | Freq: Four times a day (QID) | INTRAMUSCULAR | Status: DC
  Administered 2023-04-08 – 2023-04-10 (×8): 0.2 mg via INTRAVENOUS
  Filled 2023-04-08 (×7): qty 1

## 2023-04-08 NOTE — TOC Progression Note (Signed)
Transition of Care [facility] [place]) - Progression Note    Patient Details  Name: Audrey Turner MRN: [id number] Date of Birth: October 16, 1960  [facility]) CM/SW Contact  Harriet Masson, RN Phone Number: 04/08/2023, 2:38 PM  Clinical Narrative:     Spoke to patient's brother , [name], and [place] regarding transition needs.  [name] was offered choice for home hospice and [name] has Armed forces training and education officer.  Shawn with [facility] has accepted referral. Plan will be to discharge home Wednesday with PTAR. Patient's home 02 will need to be switched out with contacting company.  [name] with call this RNCM with any questions.   Expected Discharge Plan: Home w Hospice Care Barriers to Discharge: Continued Medical Work up  Expected Discharge Plan and Services   Discharge Planning Services: CM Consult   Living arrangements for the past 2 months: Single Family Home                             HH Agency: Other - See comment Marcell Anger) Date HH Agency Contacted: 04/08/23 Time HH Agency Contacted: 1437 Representative spoke with at [facility] Agency: Shawn   Social Determinants of Health (SDOH) Interventions SDOH Screenings   Food Insecurity: No Food Insecurity (05/23/2021)  Housing: Low Risk  (05/23/2021)  Transportation Needs: No Transportation Needs (05/23/2021)  Alcohol Screen: Low Risk  (05/23/2021)  Depression (PHQ2-9): Low Risk  (05/23/2021)  Financial Resource Strain: Low Risk  (05/23/2021)  Physical Activity: Inactive (05/23/2021)  Social Connections: Moderately Isolated (05/23/2021)  Stress: No Stress Concern Present (05/23/2021)  Tobacco Use: Low Risk  (03/30/2023)    Readmission Risk Interventions     No data to display

## 2023-04-08 NOTE — Progress Notes (Signed)
Nutrition Brief Note  Chart reviewed. Pt now transitioning to comfort care.  No further nutrition interventions planned at this time.  Please re-consult as needed.   Allie Evalyne Cortopassi, RDN, LDN Clinical Nutrition  

## 2023-04-08 NOTE — Progress Notes (Signed)
  Progress Note   Patient: Audrey Turner [id number] DOB: July 12, 1960 DOA: 03/16/2023     23 DOS: the patient was seen and examined on 04/08/2023 at 11:20AM      Brief hospital course: 62 y.o. F with history neurocytoma s/p resection in 2007, seizures, who presented with change in mentation, admitted to ICU for seizures, sepsis.    Assessment and Plan: MSSA pneumonia, present on admission Acute respiratory failure with hypoxia, present on admission Acute metabolic encephalopathy HCAP Depression Seizure disorder Stage III pressure injury, right buttock Unstageable pressure injury, sacrum   - Consult Hospice - Delirium precautions - Continue Keppra, amantadine for comfort - Continue tube feeds and Cipro for now, plan for discontinuation of all of the above at time of discharge to Hospice - Stop Pristiq and Abilify           Subjective: No change. Patient nonverbal.           Physical Exam: BP 117/60   Pulse 90   Temp 98 F (36.7 C)   Resp 20   Ht 5' (1.524 m)   Wt 53 kg   SpO2 94%   BMI 22.82 kg/m   Chronically ill-appearing adult female, lying in bed, appears weak and tired RRR, no murmurs, no peripheral edema Respiratory rate normal, lungs clear without rales or wheezes Abdomen soft, no grimace to palpation Contractured on the right side, able to get my hand to squeeze on the left, can follow commands to wiggle toes on the left, not on the right, unable to make any verbalizations, briefly makes eye contact, then closes her eyes           Author: Alberteen Sam, MD 04/08/2023 3:59 PM  For on call review [email or web address].

## 2023-04-08 NOTE — Progress Notes (Signed)
Daily Progress Note   Patient Name: Audrey Turner       Date: 04/08/2023 DOB: 01-26-1961  Age: 62 y.o. MRN#: [id number] Attending Physician: Alberteen Sam, * Primary Care Physician: Joaquim Nam, MD Admit Date: 03/16/2023  Reason for Consultation/Follow-up: Non pain symptom management, Pain control, Psychosocial/spiritual support, and Terminal Care  Subjective: I have reviewed medical records including EPIC notes, MAR, available advanced directives as necessary, and labs. Received report from primary RN - no acute concerns.  RN reports patient continues with significant respiratory secretions.   Went to visit patient at bedside -no family/visitors present.  Patient lying in bed with eyes closed -did not attempt to wake her to preserve comfort. No signs or non-verbal gestures of pain or discomfort noted. No respiratory distress or increased work of breathing; secretions noted without stethoscope. Coretrak in use.   Length of Stay: 23  Current Medications: Scheduled Meds:   amantadine  100 mg Per Tube BID   antiseptic oral rinse  15 mL Topical BID   free water  100 mL Per Tube Q4H   Gerhardt's butt cream   Topical TID   glycopyrrolate  0.2 mg Intravenous Q6H   leptospermum manuka honey  1 Application Topical Daily   levETIRAcetam  1,000 mg Per Tube BID   mouth rinse  15 mL Mouth Rinse 4 times per day   sodium chloride flush  10-40 mL Intracatheter Q12H    Continuous Infusions:  ciprofloxacin 200 mg (04/08/23 0354)   feeding supplement (OSMOLITE 1.2 CAL) 1,000 mL (04/07/23 1221)    PRN Meds: acetaminophen **OR** acetaminophen, diphenhydrAMINE, docusate sodium, glycopyrrolate **OR** glycopyrrolate **OR** glycopyrrolate, haloperidol **OR** haloperidol **OR**  haloperidol lactate, ipratropium-albuterol, LORazepam **OR** LORazepam **OR** LORazepam, morphine injection, ondansetron **OR** ondansetron (ZOFRAN) IV, mouth rinse, oxyCODONE, polyethylene glycol, polyvinyl alcohol  Physical Exam Vitals and nursing note reviewed.  Constitutional:      General: She is not in acute distress.    Appearance: She is ill-appearing.  Pulmonary:     Effort: No respiratory distress.     Comments: Respiratory secretions Skin:    General: Skin is warm and dry.  Neurological:     Motor: Weakness present.             Vital Signs: BP 117/60   Pulse 90   Temp 98 F (  36.7 C)   Resp 20   Ht 5' (1.524 m)   Wt 53 kg   SpO2 94%   BMI 22.82 kg/m  SpO2: SpO2: 94 % O2 Device: O2 Device: Nasal Cannula O2 Flow Rate: O2 Flow Rate (L/min): 2 L/min  Intake/output summary:  Intake/Output Summary (Last 24 hours) at 04/08/2023 1230 Last data filed at 04/07/2023 2348 Gross per 24 hour  Intake 667.5 ml  Output 50 ml  Net 617.5 ml   LBM: Last BM Date : 04/07/23 Baseline Weight: Weight:  (scale malfunctioning) Most recent weight: Weight: 53 kg       Palliative Assessment/Data: PPS 30% tube feeds      Patient Active Problem List   Diagnosis Date Noted   Altered mental status 03/27/2023   Pressure injury of skin 03/25/2023   Acute respiratory failure with hypoxia (HCC) 03/17/2023   URI (upper respiratory infection) 03/17/2023   Sepsis (HCC) 03/16/2023   Epistaxis 10/15/2021   Cold extremities 06/07/2021   Insomnia 01/22/2021   Healthcare maintenance 09/08/2019   Contracture of hand 09/08/2019   Urinary incontinence 06/12/2019   Medicare annual wellness visit, subsequent 06/11/2018   Lower extremity edema 06/11/2018   HLD (hyperlipidemia) 05/13/2017   Localization-related symptomatic epilepsy and epileptic syndromes with complex partial seizures, not intractable, without status epilepticus (HCC) 07/17/2016   Hx of astrocytoma 07/17/2016   Hypoxia,  sleep related 07/03/2016   Convulsions/seizures (HCC) 12/14/2015   Advance care planning 12/13/2015   Muscular deconditioning 11/10/2015   Hyperglycemia 03/12/2013   Obstructive sleep apnea 12/22/2009   Depression 08/14/2007   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 female  with past medical history of astrocytoma s/p chemo, radiation, and multiple resections (2007), seizures, and depression admitted on 03/16/2023 with altered mental status.  Workup revealed acute metabolic encephalopathy felt to be secondary to sepsis from pneumonia and status epilepticus. She was intubated for airway protection. She has now been off sedation for several days with limited improvement in mental status. She was extubated 12/19, however mental status remains poor. Very lethargic and unable to complete swallow evaluation- cortrak in place.   Assessment: Principal Problem:   Sepsis (HCC) Active Problems:   Acute respiratory failure with hypoxia (HCC)   Pressure injury of skin   Altered mental status   Terminal care  Recommendations/Plan: Continue full comfort measures Continue DNR/DNI as previously documented Hospice liaison to speak with family today to discuss home versus residential hospice placement Comfort focused medication regimen adjusted as noted below Continue artificial nutrition/hydration via coretrak until discharge PMT will continue to follow peripherally. If there are any imminent needs please call the service directly  Symptom Management Morphine PRN severe pain/dyspnea/increased work of breathing/RR>25 Oxycodone PRN moderate pain Tylenol PRN pain/fever Biotin twice daily Benadryl PRN itching Scheduled robinul q6h; PRN doses for breakthrough secretions Haldol PRN agitation/delirium Ativan PRN anxiety/seizure/sleep/distress Zofran PRN nausea/vomiting Liquifilm Tears PRN dry eye Continue amantadine,  cipro, keppra until discharge  Goals of Care and Additional Recommendations: Limitations on Scope of Treatment: Full Comfort Care  Code Status:    Code Status Orders  (From admission, onward)           Start     Ordered   04/07/23 1454  Do not attempt resuscitation (DNR) - Comfort care  Continuous       Question Answer Comment  If patient has no pulse and is not breathing Do Not Attempt  Resuscitation   In Pre-Arrest Conditions (Patient Is Breathing and Has a Pulse) Provide comfort measures. Relieve any mechanical airway obstruction. Avoid transfer unless required for comfort.   Consent: Discussion documented in EHR or advanced directives reviewed      04/07/23 1455           Code Status History     Date Active Date Inactive Code Status Order ID Comments User Context   03/28/2023 1429 04/07/2023 1455 Limited: Do not attempt resuscitation (DNR) -DNR-LIMITED -Do Not Intubate/DNI  [id number]  Anice Paganini, NP Inpatient   03/23/2023 1327 03/28/2023 1429 Do not attempt resuscitation (DNR) PRE-ARREST INTERVENTIONS DESIRED [id number]  Sherryll Burger, NP Inpatient   03/16/2023 2318 03/23/2023 1327 Full Code [id number]  Simonne Martinet, NP ED       Prognosis:  < 2 weeks  Discharge Planning: Hospice facility vs home with hospice  Care plan was discussed with primary RN  Thank you for allowing the Palliative Medicine Team to assist in the care of this patient.     Haskel Khan, NP  Please contact Palliative Medicine Team phone at [phone] for questions and concerns.   *Portions of this note are a verbal dictation therefore any spelling and/or grammatical errors are due to the "[employer]agon [employer] One" system interpretation.

## 2023-04-08 NOTE — Plan of Care (Signed)
  Problem: Education: Goal: Ability to describe self-care measures that may prevent or decrease complications (Diabetes Survival Skills Education) will improve Outcome: Not Progressing   Problem: Coping: Goal: Ability to adjust to condition or change in health will improve Outcome: Not Progressing

## 2023-04-08 NOTE — Plan of Care (Signed)
Patient is  transitioning to comfort measures an will be receiving enteral feeds and abt until DC on Wednesday

## 2023-04-08 NOTE — Progress Notes (Signed)
[facility] Liaison Note  Received request from Josie, Transitions of Care Manager, for hospice services at home after discharge. Spoke with brother, [name] to initiate education related to hospice philosophy, services, and team approach to care. [name] verbalized understanding of information given. Per discussion, the plan is for discharge home by PTAR/EMS on Wednesday 04/10/23.   DME needs discussed, patient has an adjustable bed, oxygen, walker, transport chair, and raised toilet seat in the home. [name] request no additional DME at this time. The address has been verified and is correct in the chart.   Please send signed and completed DNR home with patient/family. Please provide prescriptions at discharge as needed to ensure ongoing symptom management.   [facility] information and contact numbers given to [employer]. Above information shared with Transitions of Care Manager. Please call with any questions or concerns.   Thank you for the opportunity to participate in this patient's care.   Glenna Fellows BSN, Charity fundraiser, OCN [employer] [phone]

## 2023-04-09 NOTE — Evaluation (Signed)
 Clinical/Bedside Swallow Evaluation Patient Details  Name: Audrey Turner MRN: [id number] Date of Birth: 24-Jan-1961  Today's Date: 04/09/2023 Time: SLP Start Time (ACUTE ONLY): 1545 SLP Stop Time (ACUTE ONLY): 1632 SLP Time Calculation (min) (ACUTE ONLY): 47 min  Past Medical History:  Past Medical History:  Diagnosis Date   Acute sinusitis, unspecified    Astrocytoma (HCC) 01/2006 /09/2006   grade 3 atrocytoma s/p chemo/radiation/surgery at [facility], in remission since at least 2011   Cancer Audrey Turner)    Brain    Diarrhea    Drug induced neutropenia(288.03)    Primary exertional headache    Recurrent depression (HCC)    Vaginitis    Past Surgical History:  Past Surgical History:  Procedure Laterality Date   BRAIN SURGERY     [facility].   d&c miscarriage  1993   left frontal craniotomy w/ left subtotal resection of left frontal brain tumor  01/10/2006   [facility] brain tumor surgery  01/13/2006   NSVD x 3 - premie at 22 wks     resection residual tumor  10/08/2006   Dr. Saturnino: [facility]   skull surg  2009/2010/2012   3 times past brain surg to close up opening   HPI:  62 y.o. F with history neurocytoma s/p resection in 2007, seizures, who presented with change in mentation, admitted to ICU for seizures, sepsis. Found to have MSSA pna (completed treatment), acute metabolic encephalopathy. Intubated 12/7-12/19  Per chart she had cognitive impairment and dementia and requires assistance for ADL's including feeding. With past medical history of astrocytoma s/p chemo, radiation, and multiple resections (2007), seizures, and depression    Assessment / Plan / Recommendation  Clinical Impression  Pt was seen for f/u bedside re-assessment of swallowing at the request of pt's brother, Audrey Turner, who was present for session.  [name] was alert, able to follow simple oral-motor commands.  Oral assessment revealed dried, leathery secretions on soft palate/posterior tongue.  Oral care  allowed removal of most of the debris.  [name] accepted ice chips, teaspoons of water  with good oral attention and effort, chewing the ice and swallowing all trials. Overt coughing was present with all sips of water , suggesting ongoing aspiration. Unfortunately, there has been no meaningful improvement in the swallow that would suggest she could eat/drink functionally.    Audrey Turner and I discussed the plan to go home with hospice tomorrow.  We reviewed the benefits of frequent oral care. I encouraged him to offer his sister ice chips and small sips of water /favorite drinks throughout the day. If she coughs excessively or appears uncomfortable, he should stop and try again later.  Audrey Turner verbalized understanding; encouraged him to seek the support that Audrey Turner will be there to offer.  No further SLP f/u will be necessary. Our service will respectfully sign off.   SLP Visit Diagnosis: Dysphagia, oropharyngeal phase (R13.12)    Aspiration Risk    Significant - POs as well as secretions   Diet Recommendation    Ice chips, sips of water  and favorite drinks for pleasure       Other  Recommendations Oral Care Recommendations: Oral care prior to ice chip/H20    Recommendations for follow up therapy are one component of a multi-disciplinary discharge planning process, led by the attending physician.  Recommendations may be updated based on patient status, additional functional criteria and insurance authorization.  Follow up Recommendations No SLP follow up        Swallow Study   General Date  of Onset: 03/16/23 HPI: 62 y.o. F with history neurocytoma s/p resection in 2007, seizures, who presented with change in mentation, admitted to ICU for seizures, sepsis. Found to have MSSA pna (completed treatment), acute metabolic encephalopathy. Intubated 12/7-12/19  Per chart she had cognitive impairment and dementia and requires assistance for ADL's including feeding. With past medical history of  astrocytoma s/p chemo, radiation, and multiple resections (2007), seizures, and depression Type of Study: Bedside Swallow Evaluation Diet Prior to this Study: NPO;Cortrak/Small bore NG tube Temperature Spikes Noted: No Respiratory Status: Nasal cannula History of Recent Intubation: Yes Total duration of intubation (days): 13 days Date extubated: 03/28/23 Behavior/Cognition: Alert Oral Cavity Assessment: Dried secretions Oral Care Completed by SLP: Yes Oral Cavity - Dentition: Adequate natural dentition Self-Feeding Abilities: Total assist Patient Positioning: Upright in bed Baseline Vocal Quality: Not observed Volitional Cough: Cognitively unable to elicit    Oral/Motor/Sensory Function Overall Oral Motor/Sensory Function: Generalized oral weakness   Ice Chips Ice chips: Impaired Presentation: Spoon Pharyngeal Phase Impairments: Cough - Delayed   Thin Liquid Thin Liquid: Impaired Presentation: Spoon Pharyngeal  Phase Impairments: Cough - Immediate    Nectar Thick Nectar Thick Liquid: Not tested   Honey Thick Honey Thick Liquid: Not tested   Puree Puree: Not tested   Solid     Solid: Not tested     Audrey Turner L. Vona, MA CCC/SLP Clinical Specialist - Acute Care SLP Acute Rehabilitation Services Office number [phone]  Audrey Turner Audrey Turner Audrey Turner 04/09/2023,4:57 PM

## 2023-04-09 NOTE — Progress Notes (Signed)
  Progress Note   Patient: Audrey Turner FMW:[id number] DOB: [birth year] DOA: 03/16/2023     24 DOS: the patient was seen and examined on 04/09/2023 at 11:30 AM      Brief hospital course: 63 y.o. F with history neurocytoma s/p resection in 2007, seizures, who presented with change in mentation, admitted to ICU for seizures, sepsis.    Assessment and Plan: MSSA pneumonia, present on admission Acute respiratory failure with hypoxia, present on admission Acute metabolic encephalopathy HCAP Depression Seizure disorder Stage III pressure injury, right buttock Unstageable pressure injury, sacrum   - Consult Hospice - Delirium precautions - Continue Keppra , amantadine  for comfort - Continue tube feeds and Cipro  for now, plan for discontinuation of all of the above at time of discharge to Hospice - Stop Pristiq and Abilify            Subjective: No change. Patient nonverbal.           Physical Exam: BP 110/65 (BP Location: Left Arm)   Pulse (!) 48   Temp 98.3 F (36.8 C) (Oral)   Resp 16   Ht 5' (1.524 m)   Wt 53 kg   SpO2 91%   BMI 22.82 kg/m   Chronically ill-appearing adult female, lying in bed, appears weak and tired RRR, no murmurs, no peripheral edema Respiratory rate normal, lungs clear without rales or wheezes Abdomen soft, no grimace to palpation Contractured on the right side, able to get my hand to squeeze on the left, can follow commands to wiggle toes on the left, not on the right, unable to make any verbalizations, briefly makes eye contact, then closes her eyes           Author: Lonni SHAUNNA Dalton, MD 04/09/2023 7:16 PM  For on call review [email or web address].

## 2023-04-09 NOTE — TOC Transition Note (Signed)
 Transition of Care [facility]) - Discharge Note   Patient Details  Name: Audrey Turner MRN: [id number] Date of Birth: 1961-02-06  Transition of Care [facility]) CM/SW Contact:  Marval Gell, RN Phone Number: 04/09/2023, 9:24 AM   Clinical Narrative:     Spoke with patient's brother Dick Vonnie who confirms plans for DC tomorrow.  Address verified and PTAR forms printed and placed on chart. DNR is also on chart.     Barriers to Discharge: Continued Medical Work up   Patient Goals and CMS Choice Patient states their goals for this hospitalization and ongoing recovery are:: Brother Dick wants patient to return home with home hospice CMS Medicare.gov Compare Post Acute Care list provided to:: Patient Represenative (must comment) Choice offered to / list presented to : Sibling      Discharge Placement                       Discharge Plan and Services Additional resources added to the After Visit Summary for     Discharge Planning Services: CM Consult                        [facility] Agency: Other - See comment Vannie) Date HH Agency Contacted: 04/08/23 Time HH Agency Contacted: 1437 Representative spoke with at [facility] Agency: Shawn  Social Drivers of Health (SDOH) Interventions SDOH Screenings   Food Insecurity: No Food Insecurity (05/23/2021)  Housing: Low Risk  (05/23/2021)  Transportation Needs: No Transportation Needs (05/23/2021)  Alcohol  Screen: Low Risk  (05/23/2021)  Depression (PHQ2-9): Low Risk  (05/23/2021)  Financial Resource Strain: Low Risk  (05/23/2021)  Physical Activity: Inactive (05/23/2021)  Social Connections: Moderately Isolated (05/23/2021)  Stress: No Stress Concern Present (05/23/2021)  Tobacco Use: Low Risk  (03/30/2023)     Readmission Risk Interventions     No data to display

## 2023-04-09 NOTE — Progress Notes (Signed)
 This chaplain is present for F/U spiritual care and continued support with the Pt. brother-[name].  The chaplain listened reflectively as Dick shared the changes he sees in the Pt. today.  Dick is experiencing more Pt. alertness today and what he describes as increased Pt. swallowing function. The chaplain listens reflectively as Dick [place] his request for an additional swallow test before discharge home with hospice.  With this request, Dick is hoping for an increased level of peace in the decisions he is making for the Pt. This chaplain is appreciative of the timing of Dr. Marysue visit.    The chaplain understands Dick is finding peace in the DME delivery and family's involvement in the Pt. coming home.   This chaplain is available for F/U spiritual care as needed.  Chaplain Leeroy Hummer [phone]

## 2023-04-09 NOTE — Plan of Care (Signed)
 Unable to assess patient level of orientation due to being nonverbal. Patient turned Q2 hours as tolerated and tolerated Osmolite 1.2 tube feedings. Patient CHG bath completed and remains on 2L of oxygen  for comfort. Comfort measures maintained. Patient does not seem to be in distress/uncomfortable. VSS.   Problem: Education: Goal: Knowledge of General Education information will improve Description: Including pain rating scale, medication(s)/side effects and non-pharmacologic comfort measures Outcome: Progressing   Problem: Health Behavior/Discharge Planning: Goal: Ability to manage health-related needs will improve Outcome: Progressing   Problem: Clinical Measurements: Goal: Ability to maintain clinical measurements within normal limits will improve Outcome: Progressing   Problem: Clinical Measurements: Goal: Diagnostic test results will improve Outcome: Progressing   Problem: Clinical Measurements: Goal: Respiratory complications will improve Outcome: Progressing   Problem: Clinical Measurements: Goal: Cardiovascular complication will be avoided Outcome: Progressing   Problem: Activity: Goal: Risk for activity intolerance will decrease Outcome: Progressing   Problem: Nutrition: Goal: Adequate nutrition will be maintained Outcome: Progressing   Problem: Coping: Goal: Level of anxiety will decrease Outcome: Progressing   Problem: Elimination: Goal: Will not experience complications related to bowel motility Outcome: Progressing   Problem: Elimination: Goal: Will not experience complications related to urinary retention Outcome: Progressing   Problem: Pain Management: Goal: General experience of comfort will improve Outcome: Progressing   Problem: Safety: Goal: Ability to remain free from injury will improve Outcome: Progressing   Problem: Skin Integrity: Goal: Risk for impaired skin integrity will decrease Outcome: Progressing

## 2023-04-09 NOTE — Plan of Care (Signed)
  Problem: Clinical Measurements: Goal: Will remain free from infection Outcome: Progressing Goal: Respiratory complications will improve Outcome: Progressing Goal: Cardiovascular complication will be avoided Outcome: Progressing   Problem: Nutrition: Goal: Adequate nutrition will be maintained Outcome: Progressing   Problem: Elimination: Goal: Will not experience complications related to bowel motility Outcome: Progressing Goal: Will not experience complications related to urinary retention Outcome: Progressing   Problem: Pain Management: Goal: General experience of comfort will improve Outcome: Progressing   Problem: Safety: Goal: Ability to remain free from injury will improve Outcome: Progressing

## 2023-04-10 DIAGNOSIS — Z515 Encounter for palliative care: Secondary | ICD-10-CM

## 2023-04-10 LAB — CULTURE, BLOOD (ROUTINE X 2)
Culture: NO GROWTH
Special Requests: ADEQUATE

## 2023-04-10 MED ORDER — SCOPOLAMINE 1 MG/3DAYS TD PT72: 1.0000 | MEDICATED_PATCH | TRANSDERMAL | 12 refills | Status: DC

## 2023-04-10 MED ORDER — LORAZEPAM 2 MG/ML PO CONC: 1.0000 mg | ORAL | 0 refills | Status: DC | PRN

## 2023-04-10 MED ORDER — BIOTENE DRY MOUTH MT LIQD: 15.0000 mL | Freq: Two times a day (BID) | OROMUCOSAL | 0 refills | Status: DC

## 2023-04-10 MED ORDER — OXYCODONE HCL 20 MG/ML PO CONC: 6.0000 mg | ORAL | 0 refills | Status: DC | PRN

## 2023-04-10 MED ORDER — OXYCODONE HCL 20 MG/ML PO CONC: 5.0000 mg | ORAL | 0 refills | Status: DC | PRN

## 2023-04-10 NOTE — Progress Notes (Signed)
 Updated AVS instructions placed in chart and brother [name] notified that the ambulance Is currently on the way to pick up the patient.

## 2023-04-10 NOTE — Discharge Summary (Signed)
 Physician Discharge Summary   Patient: Audrey Turner MRN: [id number] DOB: 1960/11/27  Admit date:     03/16/2023  Discharge date: 04/10/23  Discharge Physician: Lonni SHAUNNA Dalton   PCP: Cleatus Arlyss RAMAN, MD     Recommendations at discharge:  Discharge to home Hospice     Discharge Diagnoses: Principal Problem:   Sepsis due to MSSA pneumonia Active Problems:   MSSA pneumonia, present on admission   Acute respiratory failure with hypoxia, present on admission   Acute metabolic encephalopathy   HCAP   Depression   Seizure disorder   Stage III pressure injury, right buttock   Unstageable pressure injury, sacrum    Hospital Course: 63 y.o. F with history neurocytoma s/p resection in 2007, seizures, who presented with change in mentation, admitted to ICU for seizures, sepsis.    Assessment and Plan: MSSA pneumonia, present on admission Acute respiratory failure with hypoxia, present on admission Acute metabolic encephalopathy HCAP Depression Seizure disorder Stage III pressure injury, right buttock Unstageable pressure injury, sacrum Patient was intubated and admitted to the ICU on antibiotics, found to have MSSA bacteremia with breakthrough seizures.  LP ruled out meningitis, but the patient had full recovery, finally extubated after 12 days.  At baseline the patient is wheelchair-bound, has cognitive impairment and dementia, and requires assistance for ADLs including feeding.  Post-extubation, she had very limited physical and cognitive recovery.  Postextubation she had severe weakness, was too weak to vocalize, swallow or cough, and could not lift her arms or head.  Speech therapy followed the patient, as did palliative care and she remained too weak to swallow.  FEES 12/27 notable for immediately obvious copious secretions covering the vocal cords that she was unable to clear with cues.  She remained unable to produce volitional cough, with a wet vocal  quality, and mostly aphonic on attempts to vocalize  In GOC discussion on 12/29 with brothers (POA) and 2/3 daughters, family expressed that the patient would never have wanted to be dependent in a nursing home, unable to speak and requiring feeding tube and unable to move out of bed.  Given the high degree of certainty that this was the future, certainly for a long time because of mama very likely forever, the decision was made to transition to comfort measures.  Hospice was involved, and family elected to take the patient home.                      The   Controlled Substances Registry was not required per STOP act for this hospice patient.  Consultants: Paliative Care Critical care  Disposition: Hospice care   DISCHARGE MEDICATION: Allergies as of 04/10/2023       Reactions   Dilantin [phenytoin Sodium Extended] Rash   Penicillins Rash        Medication List     STOP taking these medications    Amantadine  HCl 100 MG tablet   ARIPiprazole  5 MG tablet Commonly known as: ABILIFY    desvenlafaxine 50 MG 24 hr tablet Commonly known as: PRISTIQ   levETIRAcetam  100 MG/ML solution Commonly known as: KEPPRA    MELATONIN KIDS PO   multivitamin capsule       TAKE these medications    antiseptic oral rinse Liqd Apply 15 mLs topically 2 (two) times daily.   LORazepam  2 MG/ML concentrated solution Commonly known as: ATIVAN  Place 0.5 mLs (1 mg total) under the tongue every hour as needed for anxiety, seizure or sleep (distress).  oxyCODONE  20 MG/ML concentrated solution Commonly known as: ROXICODONE  INTENSOL Place 0.3-0.5 mLs (6-10 mg total) into feeding tube every 2 (two) hours as needed for moderate pain (pain score 4-6).   scopolamine  1 MG/3DAYS Commonly known as: TRANSDERM-SCOP Place 1 patch (1.5 mg total) onto the skin every 3 (three) days.         Discharge Instructions     No wound care   Complete by: As directed         Discharge Exam: Filed Weights   04/06/23 0500 04/06/23 0816 04/07/23 0440  Weight: 54 kg 54.1 kg 53 kg    General: Pt is awake, makes eye contact, appears sad Cardiovascular: Tachycardic, regular Respiratory: Coughing weekly, lung sounds diminished Abdominal: No distention Neuro/Psych: No neurological change, unable to vocalize, unable to lift her head, or arms.   Condition at discharge: worsening  The results of significant diagnostics from this hospitalization (including imaging, microbiology, ancillary and laboratory) are listed below for reference.   Imaging Studies: DG CHEST PORT 1 VIEW Result Date: 04/03/2023 CLINICAL DATA:  63 year old female with leukocytosis. EXAM: PORTABLE CHEST 1 VIEW COMPARISON:  Portable chest 03/31/2023 and earlier. FINDINGS: Portable AP semi upright view at 0744 hours. Enteric feeding tube courses into the abdomen as before. Stable right IJ vascular catheter. Lung volumes and mediastinal contours remain normal. Allowing for portable technique the lungs are clear. No pneumothorax. Negative visible bowel gas. Stable visualized osseous structures. IMPRESSION: No acute cardiopulmonary abnormality.  Stable lines and tubes. Electronically Signed   By: VEAR Hurst M.D.   On: 04/03/2023 11:04   DG CHEST PORT 1 VIEW Result Date: 03/31/2023 CLINICAL DATA:  Leukocytosis EXAM: PORTABLE CHEST 1 VIEW COMPARISON:  03/29/2023 FINDINGS: An enteric tube is present with tip projecting over the right upper quadrant consistent with location in the distal stomach or possibly proximal duodenal. A right central venous catheter is present with tip projecting over the cavoatrial junction region. Heart size and pulmonary vascularity are normal. Lungs are clear. No pleural effusions. No pneumothorax. Mediastinal contours appear intact. Degenerative changes in the spine and shoulders. IMPRESSION: Appliances appear in satisfactory position. Lungs are clear. No change. Electronically  Signed   By: Elsie Gravely M.D.   On: 03/31/2023 19:29   DG CHEST PORT 1 VIEW Result Date: 03/29/2023 CLINICAL DATA:  Dyspnea EXAM: PORTABLE CHEST 1 VIEW COMPARISON:  Well 1924 FINDINGS: Single frontal view of the chest demonstrates an unremarkable cardiac silhouette. Stable right internal jugular catheter tip overlying superior vena cava. Enteric catheter passes below diaphragm tip excluded by collimation. Moderate gaseous distention of the stomach has developed in the interim. No airspace disease, effusion, or pneumothorax. No acute bony abnormalities. IMPRESSION: 1. No acute intrathoracic process. 2. Moderate gaseous distention of the stomach. Electronically Signed   By: Ozell Daring M.D.   On: 03/29/2023 10:36   DG CHEST PORT 1 VIEW Result Date: 03/28/2023 CLINICAL DATA:  Fever EXAM: PORTABLE CHEST 1 VIEW COMPARISON:  03/25/2023 FINDINGS: Single frontal view of the chest demonstrates stable endotracheal tube and right internal jugular catheter. Enteric catheter again identified, tip in the region of the first portion duodenum. Cardiac silhouette is unremarkable. No airspace disease, effusion, or pneumothorax. No acute bony abnormalities. IMPRESSION: 1. Support devices as above. 2. Improved aeration of the left lung base. No acute airspace disease. Electronically Signed   By: Ozell Daring M.D.   On: 03/28/2023 10:35   DG Chest Port 1 View Result Date: 03/25/2023 CLINICAL DATA:  Respiratory failure. EXAM:  PORTABLE CHEST 1 VIEW COMPARISON:  03/17/2023 FINDINGS: ETT tip is stable above the carina. There is a feeding tube with tip below the GE junction. Right IJ catheter tip is in the projection of the superior cavoatrial junction. Unchanged asymmetric elevation of the left hemidiaphragm. Persistent opacities within the left base compatible with atelectasis and or airspace consolidation. IMPRESSION: 1. Stable support apparatus. 2. Persistent left base atelectasis and/or airspace consolidation. 3.  Unchanged asymmetric elevation of the left hemidiaphragm. Electronically Signed   By: Waddell Calk M.D.   On: 03/25/2023 08:34   DG Abd Portable 1V Result Date: 03/22/2023 CLINICAL DATA:  Feeding tube placement EXAM: PORTABLE ABDOMEN - 1 VIEW COMPARISON:  None Available. FINDINGS: Curvature of the feeding tube suggest placement in the tip in the antrum a distended stomach. Left retrocardiac airspace opacity, pneumonia not excluded. IMPRESSION: 1. Curvature of the feeding tube suggest placement in the antrum of a distended stomach. 2. Left retrocardiac airspace opacity, pneumonia not excluded. Electronically Signed   By: Ryan Salvage M.D.   On: 03/22/2023 16:22   CT HEAD WO CONTRAST ( ) Result Date: 03/18/2023 CLINICAL DATA:  Follow-up examination for stroke. EXAM: CT HEAD WITHOUT CONTRAST TECHNIQUE: Contiguous axial images were obtained from the base of the skull through the vertex without intravenous contrast. RADIATION DOSE REDUCTION: This exam was performed according to the departmental dose-optimization program which includes automated exposure control, adjustment of the mA and/or kV according to patient size and/or use of iterative reconstruction technique. COMPARISON:  Prior study from 03/16/2023. FINDINGS: Brain: Age-related cerebral atrophy with advanced cerebral white matter disease. Few small remote lacunar infarcts present about the deep gray nuclei. Prior left frontal craniectomy with cranioplasty. Underlying chronic left frontal encephalomalacia, stable. No acute intracranial hemorrhage. No acute cortically based infarct. No mass lesion or midline shift. Ventricular prominence with ex vacuo dilatation of the left lateral ventricle without hydrocephalus, stable. No extra-axial fluid collection. Chronic postoperative dural thickening underlying the left frontal cranioplasty. Vascular: No abnormal hyperdense vessel. Scattered vascular calcifications noted within the carotid siphons. Skull:  EEG leads overlie the scalp.  No acute finding. Sinuses/Orbits: Globes and orbital soft tissues demonstrate no acute finding. Paranasal sinuses and mastoid air cells are clear. Other: None. IMPRESSION: 1. Stable head CT.  No acute intracranial abnormality. 2. Prior left frontal craniectomy with cranioplasty. Underlying chronic left frontal encephalomalacia, stable. 3. Age-related cerebral atrophy with advanced cerebral white matter disease. Electronically Signed   By: Morene Hoard M.D.   On: 03/18/2023 20:52   DG Chest Port 1 View Result Date: 03/18/2023 CLINICAL DATA:  Aspiration into airway. EXAM: PORTABLE CHEST 1 VIEW COMPARISON:  03/17/2023 FINDINGS: Endotracheal tube 2.5 cm above the carina. NG tube in the stomach. Heart and mediastinal contours within normal limits. No confluent opacity on the right. Focal opacity in the left lower lobe, similar to prior study. No effusions or pneumothorax. IMPRESSION: Support devices stable. Left lower lobe atelectasis or infiltrate, unchanged. Electronically Signed   By: Franky Crease M.D.   On: 03/18/2023 00:09   ECHOCARDIOGRAM COMPLETE Result Date: 03/17/2023    ECHOCARDIOGRAM REPORT   Patient Name:   Itali OXENDINE [facility] Date of Exam: 03/17/2023 Medical Rec #:  [id number]                  Height:       60.0 in Accession #:    [id number]                 Weight:  172.0 lb Date of Birth:  09-03-1960                  BSA:          1.751 m Patient Age:    62 years                   BP:           96/60 mmHg Patient Gender: F                          HR:           54 bpm. Exam Location:  Inpatient Procedure: 2D Echo, Cardiac Doppler and Color Doppler Indications:    Shock  History:        Patient has no prior history of Echocardiogram examinations.                 Cancer s/p chemo/radiation.  Sonographer:    Clotilda Center Referring Phys: [id number] PETER E BABCOCK  Sonographer Comments: Echo performed with patient supine and on artificial respirator. Image  acquisition challenging due to respiratory motion. IMPRESSIONS  1. Left ventricular ejection fraction, by estimation, is 45 to 50%. The left ventricle has mildly decreased function. The left ventricle demonstrates global hypokinesis. Left ventricular diastolic parameters are consistent with Grade II diastolic dysfunction (pseudonormalization).  2. Right ventricular systolic function is normal. The right ventricular size is normal. There is normal pulmonary artery systolic pressure. The estimated right ventricular systolic pressure is 19.0 mmHg.  3. The mitral valve is grossly normal. Mild mitral valve regurgitation.  4. The aortic valve was not well visualized. Aortic valve regurgitation is mild.  5. The inferior vena cava is normal in size with <50% respiratory variability, suggesting right atrial pressure of 8 mmHg. FINDINGS  Left Ventricle: Left ventricular ejection fraction, by estimation, is 45 to 50%. The left ventricle has mildly decreased function. The left ventricle demonstrates global hypokinesis. The left ventricular internal cavity size was normal in size. There is  no left ventricular hypertrophy. Left ventricular diastolic parameters are consistent with Grade II diastolic dysfunction (pseudonormalization). Right Ventricle: The right ventricular size is normal. Right ventricular systolic function is normal. There is normal pulmonary artery systolic pressure. The tricuspid regurgitant velocity is 1.66 m/s, and with an assumed right atrial pressure of 8 mmHg,  the estimated right ventricular systolic pressure is 19.0 mmHg. Left Atrium: Left atrial size was normal in size. Right Atrium: Right atrial size was normal in size. Pericardium: There is no evidence of pericardial effusion. Mitral Valve: The mitral valve is grossly normal. Mild mitral valve regurgitation. Tricuspid Valve: Tricuspid valve regurgitation is mild. Aortic Valve: The aortic valve was not well visualized. Aortic valve regurgitation is  mild. Pulmonic Valve: Pulmonic valve regurgitation is not visualized. Aorta: The aortic root and ascending aorta are structurally normal, with no evidence of dilitation. Venous: The inferior vena cava is normal in size with less than 50% respiratory variability, suggesting right atrial pressure of 8 mmHg. IAS/Shunts: No atrial level shunt detected by color flow Doppler.  LEFT VENTRICLE PLAX 2D LVIDd:         3.80 cm     Diastology LVIDs:         3.00 cm     LV e' medial:    5.11 cm/s LV PW:         0.80 cm     LV E/e' medial:  12.5  LV IVS:        0.70 cm     LV e' lateral:   7.07 cm/s LVOT diam:     2.00 cm     LV E/e' lateral: 9.0 LV SV:         48 LV SV Index:   27 LVOT Area:     3.14 cm  LV Volumes (MOD) LV vol d, MOD A4C: 49.5 ml LV vol s, MOD A4C: 20.4 ml LV SV MOD A4C:     49.5 ml RIGHT VENTRICLE            IVC RV S prime:     7.05 cm/s  IVC diam: 1.90 cm TAPSE (M-mode): 1.5 cm LEFT ATRIUM           Index       RIGHT ATRIUM          Index LA diam:      2.10 cm 1.20 cm/m  RA Area:     7.77 cm LA Vol (A2C): 15.1 ml 8.63 ml/m  RA Volume:   14.20 ml 8.11 ml/m LA Vol (A4C): 16.7 ml 9.54 ml/m  AORTIC VALVE LVOT Vmax:   59.30 cm/s LVOT Vmean:  43.800 cm/s LVOT VTI:    0.152 m  AORTA Ao Root diam: 2.50 cm Ao Asc diam:  2.50 cm MITRAL VALVE               TRICUSPID VALVE MV Area (PHT): 3.19 cm    TR Peak grad:   11.0 mmHg MV Decel Time: 238 msec    TR Mean grad:   9.0 mmHg MR Peak grad: 50.8 mmHg    TR Vmax:        166.00 cm/s MR Vmax:      356.50 cm/s  TR Vmean:       148.0 cm/s MV E velocity: 63.90 cm/s MV A velocity: 38.80 cm/s  SHUNTS MV E/A ratio:  1.65        Systemic VTI:  0.15 m                            Systemic Diam: 2.00 cm Ronal Ross Electronically signed by Ronal Ross Signature Date/Time: 03/17/2023/4:00:28 PM    Final    DG FL GUIDED LUMBAR PUNCTURE Result Date: 03/17/2023 CLINICAL DATA:  Patient admitted with acute metabolic encephalopathy due to sepsis and possibly meningitis. Bedside lumbar  puncture attempt unsuccessful. Request for image guided lumbar puncture. EXAM: LUMBAR PUNCTURE UNDER FLUOROSCOPY PROCEDURE: Indications, risks, benefits and alternatives were discussed with the patient's brother India Mate via phone today who gives verbal consent to proceed. An appropriate skin entry site was determined fluoroscopically. Operator donned sterile gloves and mask. Skin site was marked, then prepped with Betadine, draped in usual sterile fashion, and infiltrated locally with 1% lidocaine . A 20 gauge spinal needle advanced into the thecal sac at L2-3 Clear colorless CSF spontaneously returned, with opening pressure of 15 cm water . 6 ml CSF were collected and divided among 4 sterile vials for the requested laboratory studies. The needle was then removed. The patient tolerated the procedure well and there were no complications. FLUOROSCOPY: Radiation Exposure Index (as provided by the fluoroscopic device): 7.6 mGy Kerma IMPRESSION: Technically successful lumbar puncture under fluoroscopy. This exam was performed by Clotilda Hesselbach, PA-C, and was supervised and interpreted by Wilkie Lent, MD. Electronically Signed   By: Wilkie Lent M.D.   On: 03/17/2023 15:32  Overnight EEG with video Result Date: 03/17/2023 Shelton Arlin KIDD, MD     03/18/2023  6:24 AM Patient Name: Camelia Stelzner MRN: [id number] Epilepsy Attending: Arlin KIDD Shelton Referring Physician/Provider: Khaliqdina, Salman, MD Duration: 03/17/2023 0118 to 12/9/20204 0230 Patient history: 63yo M with breakthrough seizure getting eeg to evaluate for seizure Level of alertness: comatose AEDs during EEG study: LEV, propofol  Technical aspects: This EEG study was done with scalp electrodes positioned according to the 10-20 International system of electrode placement. Electrical activity was reviewed with band pass filter of 1-70Hz , sensitivity of 7 uV/mm, display speed of 64mm/sec with a 60Hz  notched filter applied as  appropriate. EEG data were recorded continuously and digitally stored.  Video monitoring was available and reviewed as appropriate. Description: At the beginning of the study, EEG showed burst attenuation pattern with asynchronous bursts of generalized and lateralized left hemisphere 3-5hz  theta-delta slowing admixed with 13-15Hz  beta activity lasting 1-4 seconds alternating with 1-3 seconds of generalized attenuation. Gradually as sedation was weaned, EEG showed continuous generalized and lateralized left hemisphere 3-6Hz  theta-delta slowing, at times with triphasic morphology and periodic at 1-2Hz . EEG also showed breach artifact in vertex region with 3-5Hz  sharply contoured theta-delta slowing admixed with 12-14Hz  beta activity. Polyspikes were noted in left frontal, vertex region. Hyperventilation and photic stimulation were not performed.   EEG was disconnected between 03/17/2023 1300 to 1415 for fluoro guided LP. ABNORMALITY - Polyspikes, left frontal and vertex region. - Burst attenuation, generalized and lateralized left hemisphere - Breach artifact, vertex region IMPRESSION: This study showed evidence of epileptogenicity arising from  left frontal and vertex region. There was cortical dysfunction arising from left hemisphere likely secondary to underlying structural abnormality. Additionally there was cortical dysfunction in vertex region consistent with underlying craniotomy. Lastly there was severe to profound diffuse encephalopathy related to sedation. As sedation was weaned, this improved to moderate to severe encephalopathy. No seizures were seen throughout the recording. Priyanka KIDD Shelton   DG CHEST PORT 1 VIEW Result Date: 03/17/2023 CLINICAL DATA:  Central line placement EXAM: PORTABLE CHEST 1 VIEW COMPARISON:  03/16/2023 FINDINGS: Right central line in place with the tip in the SVC. No pneumothorax. Endotracheal tube is 2 cm above the carina. NG tube tip in the distal esophagus near the GE  junction. Recommend advancing several cm. Heart and mediastinal contours within normal limits. Left lower lobe airspace opacity could reflect atelectasis or infiltrate. No confluent opacity on the right. No effusions or acute bony abnormality. IMPRESSION: Support devices as above. Recommend advancing the NG tube several cm into the stomach. Left lower lobe atelectasis or infiltrate. Electronically Signed   By: Franky Crease M.D.   On: 03/17/2023 03:53   DG Chest Port 1 View Result Date: 03/16/2023 CLINICAL DATA:  Respiratory failure EXAM: PORTABLE CHEST 1 VIEW COMPARISON:  Film from earlier in the same day. FINDINGS: Cardiac shadows within normal limits. Endotracheal tube is noted in satisfactory position. Mild right basilar atelectasis is seen. No other focal abnormality is noted. IMPRESSION: Endotracheal tube in satisfactory position. Mild right basilar atelectasis. Electronically Signed   By: Oneil Devonshire M.D.   On: 03/16/2023 23:35   DG Chest Portable 1 View Result Date: 03/16/2023 CLINICAL DATA:  Concern for sepsis.  Partial seizure. EXAM: PORTABLE CHEST 1 VIEW COMPARISON:  07/03/2016. FINDINGS: The heart size and mediastinal contours are within normal limits. No consolidation, effusion, or pneumothorax. No acute osseous abnormality. IMPRESSION: No active disease. Electronically Signed   By: Leita Waddell HERO.D.  On: 03/16/2023 22:20   MR BRAIN WO CONTRAST Result Date: 03/16/2023 CLINICAL DATA:  Encephalopathy EXAM: MRI HEAD WITHOUT CONTRAST TECHNIQUE: Multiplanar, multiecho pulse sequences of the brain and surrounding structures were obtained without intravenous contrast. COMPARISON:  01/06/2019 FINDINGS: Brain: No acute infarct, mass effect or extra-axial collection. Focus of hyperintensity at the lateral aspect of the left precentral gyrus on diffusion-weighted imaging is artifactual due to magnetic susceptibility effects. Chronic hemosiderin over the anterior left hemisphere. No acute hemorrhage. Ex  vacuo dilatation of the left lateral ventricle with mild lateral and third ventriculomegaly. There is confluent hyperintense T2-weighted signal within the periventricular and deep white matter. The midline structures are normal. Vascular: Normal flow voids. Skull and upper cervical spine: Remote left craniectomy Sinuses/Orbits:No paranasal sinus fluid levels or advanced mucosal thickening. No mastoid or middle ear effusion. Normal orbits. IMPRESSION: 1. No acute intracranial abnormality. 2. Remote left craniectomy with ex vacuo dilatation of the left lateral ventricle and mild lateral and third ventriculomegaly Electronically Signed   By: Franky Stanford M.D.   On: 03/16/2023 20:49   CT HEAD CODE STROKE WO CONTRAST Result Date: 03/16/2023 CLINICAL DATA:  Code stroke.  Acute neurologic deficit EXAM: CT HEAD WITHOUT CONTRAST TECHNIQUE: Contiguous axial images were obtained from the base of the skull through the vertex without intravenous contrast. RADIATION DOSE REDUCTION: This exam was performed according to the departmental dose-optimization program which includes automated exposure control, adjustment of the mA and/or kV according to patient size and/or use of iterative reconstruction technique. COMPARISON:  None Available. FINDINGS: Brain: Postsurgical changes of the left frontal lobe with ballooning of the frontal horn of the left lateral ventricle, unchanged. Progression of bilateral white matter hypoattenuation and left frontal encephalomalacia. No acute hemorrhage. Vascular: No abnormal hyperdensity of the major intracranial arteries or dural venous sinuses. No intracranial atherosclerosis. Skull: Remote left-sided craniectomy. Sinuses/Orbits: No fluid levels or advanced mucosal thickening of the visualized paranasal sinuses. No mastoid or middle ear effusion. The orbits are normal. ASPECTS [facility] Stroke Program Early CT Score) - Ganglionic level infarction (caudate, lentiform nuclei, internal capsule,  insula, M1-M3 cortex): 7 - Supraganglionic infarction (M4-M6 cortex): 3 Total score (0-10 with 10 being normal): 10 IMPRESSION: 1. No acute intracranial hemorrhage. 2. ASPECTS is 10. 3. Progression of bilateral white matter hypoattenuation and left frontal encephalomalacia. These results were communicated to Dr. Salman Khaliqdina at 8:21 pm on 03/16/2023 by text page via the [facility] messaging system. Electronically Signed   By: Franky Stanford M.D.   On: 03/16/2023 20:24    Microbiology: Results for orders placed or performed during the hospital encounter of 03/16/23  MRSA Next Gen by PCR, Nasal     Status: None   Collection Time: 03/16/23 12:50 AM   Specimen: Nasal Mucosa; Nasal Swab  Result Value Ref Range Status   MRSA by PCR Next Gen NOT DETECTED NOT DETECTED Final    Comment: (NOTE) The GeneXpert MRSA Assay (FDA approved for NASAL specimens only), is one component of a comprehensive MRSA colonization surveillance program. It is not intended to diagnose MRSA infection nor to guide or monitor treatment for MRSA infections. Test performance is not FDA approved in patients less than 49 years old. Performed at [facility], [address] [place], [place] [address]   Blood Culture (routine x 2)     Status: Abnormal   Collection Time: 03/16/23  8:01 PM   Specimen: BLOOD  Result Value Ref Range Status   Specimen Description BLOOD RIGHT ANTECUBITAL  Final   Special Requests   Final    BOTTLES DRAWN AEROBIC AND ANAEROBIC Blood Culture results may not be optimal due to an excessive volume of blood received in culture bottles   Culture  Setup Time   Final    GRAM POSITIVE COCCI AEROBIC BOTTLE ONLY Organism ID to follow CRITICAL RESULT CALLED TO, READ BACK BY AND VERIFIED WITH: J [facility]  03/18/23 MK    Culture (A)  Final    STAPHYLOCOCCUS CAPITIS THE SIGNIFICANCE OF ISOLATING THIS ORGANISM FROM A SINGLE SET OF BLOOD CULTURES WHEN MULTIPLE SETS ARE DRAWN IS UNCERTAIN. PLEASE  NOTIFY THE MICROBIOLOGY DEPARTMENT WITHIN ONE WEEK IF SPECIATION AND SENSITIVITIES ARE REQUIRED. Performed at [facility], [address] [place], [place] [address]    Report Status 03/19/2023 FINAL  Final  Blood Culture ID Panel (Reflexed)     Status: Abnormal   Collection Time: 03/16/23  8:01 PM  Result Value Ref Range Status   Enterococcus faecalis NOT DETECTED NOT DETECTED Final   Enterococcus Faecium NOT DETECTED NOT DETECTED Final   Listeria monocytogenes NOT DETECTED NOT DETECTED Final   Staphylococcus species DETECTED (A) NOT DETECTED Final    Comment: CRITICAL RESULT CALLED TO, READ BACK BY AND VERIFIED WITH: S STONE,RN@0155  03/18/23 MK    Staphylococcus aureus (BCID) NOT DETECTED NOT DETECTED Final   Staphylococcus epidermidis NOT DETECTED NOT DETECTED Final   Staphylococcus lugdunensis NOT DETECTED NOT DETECTED Final   Streptococcus species NOT DETECTED NOT DETECTED Final   Streptococcus agalactiae NOT DETECTED NOT DETECTED Final   Streptococcus pneumoniae NOT DETECTED NOT DETECTED Final   Streptococcus pyogenes NOT DETECTED NOT DETECTED Final   A.calcoaceticus-baumannii NOT DETECTED NOT DETECTED Final   Bacteroides fragilis NOT DETECTED NOT DETECTED Final   Enterobacterales NOT DETECTED NOT DETECTED Final   Enterobacter cloacae complex NOT DETECTED NOT DETECTED Final   Escherichia coli NOT DETECTED NOT DETECTED Final   Klebsiella aerogenes NOT DETECTED NOT DETECTED Final   Klebsiella oxytoca NOT DETECTED NOT DETECTED Final   Klebsiella pneumoniae NOT DETECTED NOT DETECTED Final   Proteus species NOT DETECTED NOT DETECTED Final   Salmonella species NOT DETECTED NOT DETECTED Final   Serratia marcescens NOT DETECTED NOT DETECTED Final   Haemophilus influenzae NOT DETECTED NOT DETECTED Final   Neisseria meningitidis NOT DETECTED NOT DETECTED Final   Pseudomonas aeruginosa NOT DETECTED NOT DETECTED Final   Stenotrophomonas maltophilia NOT DETECTED NOT DETECTED Final    Candida albicans NOT DETECTED NOT DETECTED Final   Candida auris NOT DETECTED NOT DETECTED Final   Candida glabrata NOT DETECTED NOT DETECTED Final   Candida krusei NOT DETECTED NOT DETECTED Final   Candida parapsilosis NOT DETECTED NOT DETECTED Final   Candida tropicalis NOT DETECTED NOT DETECTED Final   Cryptococcus neoformans/gattii NOT DETECTED NOT DETECTED Final    Comment: Performed at [facility], [address] [place], [place] [address]  Resp panel by RT-PCR (RSV, Flu A&B, Covid) Urine, Clean Catch     Status: None   Collection Time: 03/16/23  9:02 PM   Specimen: Urine, Clean Catch; Nasal Swab  Result Value Ref Range Status   SARS Coronavirus 2 by RT PCR NEGATIVE NEGATIVE Final   Influenza A by PCR NEGATIVE NEGATIVE Final   Influenza B by PCR NEGATIVE NEGATIVE Final    Comment: (NOTE) The Xpert Xpress SARS-CoV-2/FLU/RSV plus assay is intended as an aid in the diagnosis of influenza from Nasopharyngeal swab specimens and should not be used as a  sole basis for treatment. Nasal washings and aspirates are unacceptable for Xpert Xpress SARS-CoV-2/FLU/RSV testing.  Fact Sheet for Patients: bloggercourse.com  Fact Sheet for Healthcare Providers: seriousbroker.it  This test is not yet approved or cleared by the [place]  FDA and has been authorized for detection and/or diagnosis of SARS-CoV-2 by FDA under an Emergency Use Authorization (EUA). This EUA will remain in effect (meaning this test can be used) for the duration of the COVID-19 declaration under Section 564(b)(1) of the Act, 21 U.S.C. section 360bbb-3(b)(1), unless the authorization is terminated or revoked.     Resp Syncytial Virus by PCR NEGATIVE NEGATIVE Final    Comment: (NOTE) Fact Sheet for Patients: bloggercourse.com  Fact Sheet for Healthcare Providers: seriousbroker.it  This test is not yet  approved or cleared by the [place]  FDA and has been authorized for detection and/or diagnosis of SARS-CoV-2 by FDA under an Emergency Use Authorization (EUA). This EUA will remain in effect (meaning this test can be used) for the duration of the COVID-19 declaration under Section 564(b)(1) of the Act, 21 U.S.C. section 360bbb-3(b)(1), unless the authorization is terminated or revoked.  Performed at [facility], [address], [place], [place] [address]   Blood Culture (routine x 2)     Status: None   Collection Time: 03/16/23 11:17 PM   Specimen: BLOOD  Result Value Ref Range Status   Specimen Description BLOOD BLOOD RIGHT ARM  Final   Special Requests   Final    BOTTLES DRAWN AEROBIC AND ANAEROBIC Blood Culture results may not be optimal due to an inadequate volume of blood received in culture bottles   Culture   Final    NO GROWTH 5 DAYS Performed at [facility], [address], [place], [place] [address]    Report Status 03/21/2023 FINAL  Final  Respiratory (~20 pathogens) panel by PCR     Status: None   Collection Time: 03/17/23  4:03 AM   Specimen: Nasopharyngeal Swab; Respiratory  Result Value Ref Range Status   Adenovirus NOT DETECTED NOT DETECTED Final   Coronavirus 229E NOT DETECTED NOT DETECTED Final    Comment: (NOTE) The Coronavirus on the Respiratory Panel, DOES NOT test for the novel  Coronavirus (2019 nCoV)    Coronavirus HKU1 NOT DETECTED NOT DETECTED Final   Coronavirus NL63 NOT DETECTED NOT DETECTED Final   Coronavirus OC43 NOT DETECTED NOT DETECTED Final   Metapneumovirus NOT DETECTED NOT DETECTED Final   Rhinovirus / Enterovirus NOT DETECTED NOT DETECTED Final   Influenza A NOT DETECTED NOT DETECTED Final   Influenza B NOT DETECTED NOT DETECTED Final   Parainfluenza Virus 1 NOT DETECTED NOT DETECTED Final   Parainfluenza Virus 2 NOT DETECTED NOT DETECTED Final   Parainfluenza Virus 3 NOT DETECTED NOT DETECTED Final   Parainfluenza  Virus 4 NOT DETECTED NOT DETECTED Final   Respiratory Syncytial Virus NOT DETECTED NOT DETECTED Final   Bordetella pertussis NOT DETECTED NOT DETECTED Final   Bordetella Parapertussis NOT DETECTED NOT DETECTED Final   Chlamydophila pneumoniae NOT DETECTED NOT DETECTED Final   Mycoplasma pneumoniae NOT DETECTED NOT DETECTED Final    Comment: Performed at [facility], [address] [place], [place] [address]  Culture, Respiratory w Gram Stain     Status: None   Collection Time: 03/17/23  4:16 AM   Specimen: Tracheal Aspirate; Respiratory  Result Value Ref Range Status   Specimen Description TRACHEAL ASPIRATE  Final   Special Requests NONE  Final   Gram Stain   Final    MODERATE WBC PRESENT,BOTH PMN AND MONONUCLEAR MODERATE GRAM POSITIVE COCCI IN PAIRS FEW GRAM POSITIVE RODS FEW YEAST WITH PSEUDOHYPHAE FEW GRAM NEGATIVE RODS Performed at [facility], [address], [place], [place] [address]    Culture ABUNDANT STAPHYLOCOCCUS AUREUS  Final   Report Status 03/20/2023 FINAL  Final   Organism ID, Bacteria STAPHYLOCOCCUS AUREUS  Final      Susceptibility   Staphylococcus aureus - MIC*    CIPROFLOXACIN  <=0.5 SENSITIVE Sensitive     ERYTHROMYCIN  <=0.25 SENSITIVE Sensitive     GENTAMICIN <=0.5 SENSITIVE Sensitive     OXACILLIN <=0.25 SENSITIVE Sensitive     TETRACYCLINE <=1 SENSITIVE Sensitive     VANCOMYCIN  <=0.5 SENSITIVE Sensitive     TRIMETH/SULFA <=10 SENSITIVE Sensitive     CLINDAMYCIN <=0.25 SENSITIVE Sensitive     RIFAMPIN <=0.5 SENSITIVE Sensitive     Inducible Clindamycin NEGATIVE Sensitive     LINEZOLID  2 SENSITIVE Sensitive     * ABUNDANT STAPHYLOCOCCUS AUREUS  CSF culture w Gram Stain     Status: None   Collection Time: 03/17/23  2:03 PM   Specimen: PATH Cytology CSF; Cerebrospinal Fluid  Result Value Ref Range Status   Specimen Description CSF  Final   Special Requests NONE  Final   Gram Stain   Final    NO ORGANISMS SEEN WBC PRESENT, PREDOMINANTLY  MONONUCLEAR CYTOSPIN SMEAR    Culture   Final    NO GROWTH 3 DAYS Performed at [facility], [address] [place], [place] [address]    Report Status 03/20/2023 FINAL  Final  Culture, blood (Routine X 2) w Reflex to ID Panel     Status: None   Collection Time: 03/18/23 10:19 AM   Specimen: BLOOD RIGHT HAND  Result Value Ref Range Status   Specimen Description BLOOD RIGHT HAND  Final   Special Requests   Final    BOTTLES DRAWN AEROBIC ONLY Blood Culture results may not be optimal due to an inadequate volume of blood received in culture bottles   Culture   Final    NO GROWTH 5 DAYS Performed at [facility], [address], [place], [place] [address]    Report Status 03/23/2023 FINAL  Final  Culture, blood (Routine X 2) w Reflex to ID Panel     Status: None   Collection Time: 03/18/23 10:19 AM   Specimen: BLOOD LEFT HAND  Result Value Ref Range Status   Specimen Description BLOOD LEFT HAND  Final   Special Requests   Final    BOTTLES DRAWN AEROBIC ONLY Blood Culture results may not be optimal due to an inadequate volume of blood received in culture bottles   Culture   Final    NO GROWTH 5 DAYS Performed at [facility], [address] [place], [place] [address]    Report Status 03/23/2023 FINAL  Final  Culture, blood (Routine X 2) w Reflex to ID Panel     Status: None   Collection Time: 03/28/23  9:09 AM   Specimen: BLOOD LEFT HAND  Result Value Ref Range Status   Specimen Description BLOOD LEFT HAND  Final   Special Requests   Final    BOTTLES DRAWN AEROBIC AND ANAEROBIC Blood Culture adequate volume   Culture   Final    NO GROWTH 5 DAYS Performed at [facility], [address] [place], [place] [address]  Report Status 04/02/2023 FINAL  Final  Culture, blood (Routine X 2) w Reflex to ID Panel     Status: None   Collection Time: 03/28/23  9:11 AM   Specimen: BLOOD LEFT HAND  Result Value Ref Range Status   Specimen Description BLOOD LEFT  HAND  Final   Special Requests   Final    BOTTLES DRAWN AEROBIC AND ANAEROBIC Blood Culture adequate volume   Culture   Final    NO GROWTH 5 DAYS Performed at [facility], [address], [place], [place] [address]    Report Status 04/02/2023 FINAL  Final  Culture, OB Urine     Status: Abnormal   Collection Time: 03/31/23  6:31 PM   Specimen: Urine, Random  Result Value Ref Range Status   Specimen Description URINE, RANDOM  Final   Special Requests NONE  Final   Culture (A)  Final    60,000 COLONIES/mL PSEUDOMONAS AERUGINOSA NO GROUP B STREP (S.AGALACTIAE) ISOLATED Performed at [facility], [address] [place], [place] [address]    Report Status 04/02/2023 FINAL  Final   Organism ID, Bacteria PSEUDOMONAS AERUGINOSA (A)  Final      Susceptibility   Pseudomonas aeruginosa - MIC*    CEFTAZIDIME 32 RESISTANT Resistant     CIPROFLOXACIN  <=0.25 SENSITIVE Sensitive     GENTAMICIN <=1 SENSITIVE Sensitive     IMIPENEM 2 SENSITIVE Sensitive     * 60,000 COLONIES/mL PSEUDOMONAS AERUGINOSA  Culture, blood (Routine X 2) w Reflex to ID Panel     Status: None   Collection Time: 04/05/23  7:27 PM   Specimen: BLOOD LEFT HAND  Result Value Ref Range Status   Specimen Description BLOOD LEFT HAND  Final   Special Requests   Final    BOTTLES DRAWN AEROBIC AND ANAEROBIC Blood Culture adequate volume   Culture   Final    NO GROWTH 5 DAYS Performed at [facility], [address] [place], [place] [address]    Report Status 04/10/2023 FINAL  Final  Remove urinary catheter to obtain Straight Cath urine culture     Status: Abnormal   Collection Time: 04/05/23  8:42 PM   Specimen: In/Out Cath Urine  Result Value Ref Range Status   Specimen Description IN/OUT CATH URINE  Final   Special Requests   Final    NONE Performed at [facility], [address], [place], [place] [address]    Culture >=100,000 COLONIES/mL PSEUDOMONAS AERUGINOSA (A)  Final   Report Status  04/07/2023 FINAL  Final   Organism ID, Bacteria PSEUDOMONAS AERUGINOSA (A)  Final      Susceptibility   Pseudomonas aeruginosa - MIC*    CEFTAZIDIME 16 INTERMEDIATE Intermediate     CIPROFLOXACIN  <=0.25 SENSITIVE Sensitive     GENTAMICIN <=1 SENSITIVE Sensitive     IMIPENEM 1 SENSITIVE Sensitive     * >=100,000 COLONIES/mL PSEUDOMONAS AERUGINOSA    Labs: CBC: Recent Labs  Lab 04/04/23 0335 04/05/23 0841 04/06/23 0951  WBC 13.1* 9.9 13.6*  HGB 8.2* 9.2* 10.0*  HCT 25.7* 28.5* 30.5*  MCV 90.8 90.2 89.4  PLT 364 456* 419*   Basic Metabolic Panel: Recent Labs  Lab 04/03/23 1250 04/04/23 0335 04/05/23 0841 04/06/23 0951  NA  --  140 139 138  K  --  4.0 4.7 4.8  CL  --  110 103 101  CO2  --  25 29 26   GLUCOSE 145* 98 157* 138*  BUN  --  25* 31* 33*  CREATININE  --  0.45 0.64 0.60  CALCIUM   --  6.8* 8.2* 8.5*   Liver Function Tests: Recent Labs  Lab 04/04/23 0335 04/05/23 0841 04/06/23 0951  AST 33 39 38  ALT 34 44 39  ALKPHOS 108 174* 194*  BILITOT 0.9 1.2* 0.9  PROT 4.0* 5.5* 5.8*  ALBUMIN <1.5* 1.8* 1.7*   CBG: Recent Labs  Lab 04/03/23 1244 04/03/23 1947  GLUCAP 69* 69*    Discharge time spent: approximately 45 minutes spent on discharge counseling, evaluation of patient on day of discharge, and coordination of discharge planning with nursing, social work, pharmacy and case management  Signed: Lonni SHAUNNA Dalton, MD [facility] 04/10/2023

## 2023-04-10 NOTE — Progress Notes (Signed)
 Daily Progress Note   Patient Name: Audrey Turner       Date: 04/10/2023 DOB: 1961-02-27  Age: 63 y.o. MRN#: [id number] Attending Physician: Jonel Lonni SQUIBB, * Primary Care Physician: Cleatus Arlyss RAMAN, MD Admit Date: 03/16/2023 Length of Stay: 25 days  Reason for Consultation/Follow-up: Establishing goals of care  HPI/Patient Profile:  63 y.o. female  with past medical history of astrocytoma s/p chemo, radiation, and multiple resections (2007), seizures, and depression admitted on 03/16/2023 with altered mental status.    Acute metabolic encephalopathy felt to be secondary to sepsis from pneumonia and status epilepticus. She was intubated for airway protection. She has now been off sedation for several days with limited improvement in mental status.   Patient faces treatment option decisions, advanced directive decisions and anticipatory care needs.  Subjective:   Subjective: Chart Reviewed. Updates received. Patient Assessed. Created space and opportunity for patient  and family to explore thoughts and feelings regarding current medical situation.  Today's Discussion: Today saw the patient at bedside, no family was present.  The patient opened her eyes and made eye contact with me upon calling her name.  She denies pain, nausea, vomiting.  She was trying to tell me something that I could not understand because of a whisper/weak voice.  I got another staff member to assist but the patient stopped trying to express herself.  The understanding is plan to discharge home with hospice today.  I was unable to reach out to the patient's brother Dick again today.  Voicemail box is full.  If I can get in touch with him I will offer my support.  Otherwise anticipate patient will discharge shortly.  I provided emotional and general support through therapeutic listening, empathy, sharing of stories, therapeutic touch, and other techniques. I answered all questions and addressed all  concerns to the best of my ability.  ROS Limited due to condition Review of Systems  Constitutional:        Denies pain in general  Gastrointestinal:  Negative for abdominal pain, nausea and vomiting.    Objective:   Vital Signs:  BP 127/65 (BP Location: Left Wrist)   Pulse 67   Temp 98.7 F (37.1 C) (Oral)   Resp 18   Ht 5' (1.524 m)   Wt 53 kg   SpO2 91%   BMI 22.82 kg/m   Physical Exam Vitals and nursing note reviewed.  Constitutional:      General: She is sleeping. She is not in acute distress. HENT:     Head: Normocephalic and atraumatic.  Pulmonary:     Effort: Pulmonary effort is normal. No respiratory distress.  Abdominal:     General: Abdomen is flat.     Palpations: Abdomen is soft.  Skin:    General: Skin is warm and dry.  Neurological:     Mental Status: She is easily aroused.  Psychiatric:        Mood and Affect: Mood normal.        Behavior: Behavior normal.     Palliative Assessment/Data: 10%    Existing Vynca/ACP Documentation: None  Assessment & Plan:   Impression: Present on Admission:  Sepsis (HCC)  SUMMARY OF RECOMMENDATIONS   Remain DNR Continue comfort care Anticipate discharge home with home hospice today Palliative medicine available for any palliative needs prior to discharge  Symptom Management:  Morphine  PRN severe pain/dyspnea/increased work of breathing/RR>25 Oxycodone  PRN moderate pain Tylenol  PRN pain/fever Biotin twice daily Benadryl  PRN itching Scheduled robinul  q6h;  PRN doses for breakthrough secretions Haldol  PRN agitation/delirium Ativan  PRN anxiety/seizure/sleep/distress Zofran  PRN nausea/vomiting Liquifilm Tears PRN dry eye Continue amantadine , cipro , keppra  until discharge  Code Status: DNR  Prognosis: < 2 weeks  Discharge Planning: Home with Hospice  Discussed with: Patient, medical team, nursing team  Thank you for allowing us  to participate in the care of Richerd Vonnie Bound PMT will  continue to support holistically.  Time Total: 30 min  Detailed review of medical records (labs, imaging, vital signs), medically appropriate exam, discussed with treatment team, counseling and education to patient, family, & staff, documenting clinical information, medication management, coordination of care  Camellia Kays, NP Palliative Medicine Team  Team Phone # [phone] (Nights/Weekends)  12/06/2020, 8:17 AM

## 2023-04-10 NOTE — Plan of Care (Signed)
 Unable to assess patient orientation level due to being nonverbal. Patient Cortrak removed prior to discharge, DNR form is in the chart along with AVS discharge instructions. PIV removed prior to discharge. VSS, comfort measures maintained, patient does not seem to be uncomfortable.   Problem: Education: Goal: Knowledge of General Education information will improve Description: Including pain rating scale, medication(s)/side effects and non-pharmacologic comfort measures Outcome: Adequate for Discharge   Problem: Health Behavior/Discharge Planning: Goal: Ability to manage health-related needs will improve Outcome: Adequate for Discharge   Problem: Clinical Measurements: Goal: Ability to maintain clinical measurements within normal limits will improve Outcome: Adequate for Discharge   Problem: Clinical Measurements: Goal: Will remain free from infection Outcome: Adequate for Discharge   Problem: Clinical Measurements: Goal: Diagnostic test results will improve Outcome: Adequate for Discharge   Problem: Clinical Measurements: Goal: Respiratory complications will improve Outcome: Adequate for Discharge   Problem: Clinical Measurements: Goal: Cardiovascular complication will be avoided Outcome: Adequate for Discharge   Problem: Activity: Goal: Risk for activity intolerance will decrease Outcome: Adequate for Discharge   Problem: Nutrition: Goal: Adequate nutrition will be maintained Outcome: Adequate for Discharge   Problem: Coping: Goal: Level of anxiety will decrease Outcome: Adequate for Discharge   Problem: Elimination: Goal: Will not experience complications related to bowel motility Outcome: Adequate for Discharge   Problem: Elimination: Goal: Will not experience complications related to urinary retention Outcome: Adequate for Discharge   Problem: Pain Management: Goal: General experience of comfort will improve Outcome: Adequate for Discharge   Problem:  Safety: Goal: Ability to remain free from injury will improve Outcome: Adequate for Discharge   Problem: Skin Integrity: Goal: Risk for impaired skin integrity will decrease Outcome: Adequate for Discharge

## 2023-04-10 NOTE — TOC Transition Note (Signed)
 Transition of Care [facility]) - Discharge Note   Patient Details  Name: Audrey Turner MRN: [id number] Date of Birth: November 04, 1960  [facility] of [facility] CM/SW Contact:  Roxie KANDICE Stain, RN Phone Number: 04/10/2023, 10:41 AM   Clinical Narrative:   Patient stable to transfer to home with hospice.  Spoke to Larose, brother, who states home is ready for patient.  [facility] called. Debby, Bedside RN notified EMS has been called. [facility] home hospice RN will admit patient @ 1700 today.     Final next level of care: Home w Hospice Care Barriers to Discharge: Barriers Resolved   Patient Goals and CMS Choice Patient states their goals for this hospitalization and ongoing recovery are:: Brother Dick wants patient to return home with home hospice CMS Medicare.gov Compare Post Acute Care list provided to:: Patient Represenative (must comment) Choice offered to / list presented to : Sibling      Discharge Placement                 Home with hospice.       Discharge Plan and Services Additional resources added to the After Visit Summary for     Discharge Planning Services: CM Consult                        [facility] Agency: Other - See comment Vannie) Date HH Agency Contacted: 04/08/23 Time HH Agency Contacted: 1437 Representative spoke with at [facility] Agency: Shawn  Social Drivers of Health (SDOH) Interventions SDOH Screenings   Food Insecurity: No Food Insecurity (05/23/2021)  Housing: Low Risk  (05/23/2021)  Transportation Needs: No Transportation Needs (05/23/2021)  Alcohol  Screen: Low Risk  (05/23/2021)  Depression (PHQ2-9): Low Risk  (05/23/2021)  Financial Resource Strain: Low Risk  (05/23/2021)  Physical Activity: Inactive (05/23/2021)  Social Connections: Moderately Isolated (05/23/2021)  Stress: No Stress Concern Present (05/23/2021)  Tobacco Use: Low Risk  (03/30/2023)     Readmission Risk Interventions     No data to display

## 2023-04-14 ENCOUNTER — Telehealth: Payer: Self-pay | Admitting: Family Medicine

## 2023-04-14 NOTE — Telephone Encounter (Signed)
 Called pt's brother re: pt's condition and recent events.  She is home on hospice care and he is there with her.  I thank all involved.  He thanked me for the call.

## 2023-05-11 DEATH — deceased

## 2023-05-22 ENCOUNTER — Encounter: Payer: Self-pay | Admitting: Internal Medicine

## 2023-09-13 ENCOUNTER — Ambulatory Visit: Payer: Self-pay | Admitting: Neurology

## 2023-09-27 ENCOUNTER — Telehealth: Payer: Self-pay | Admitting: Neurology

## 2023-09-27 ENCOUNTER — Ambulatory Visit: Payer: Self-pay | Admitting: Neurology

## 2023-09-27 NOTE — Telephone Encounter (Signed)
 Pt's brother called in to let us  know that the pt passed away on 2023/05/06. He noticed she had an appointment scheduled for today and wanted to let us  know. I cancelled the appointment.

## 2023-11-04 NOTE — Progress Notes (Signed)
 This encounter was created in error - please disregard.
